# Patient Record
Sex: Male | Born: 1935 | Race: White | Hispanic: No | Marital: Married | Smoking: Never smoker
Health system: Southern US, Community
[De-identification: ages and names within clinical notes are randomized; demographics above are authoritative.]

## PROBLEM LIST (undated history)

## (undated) DIAGNOSIS — I509 Heart failure, unspecified: Secondary | ICD-10-CM

## (undated) DIAGNOSIS — E1140 Type 2 diabetes mellitus with diabetic neuropathy, unspecified: Secondary | ICD-10-CM

## (undated) DIAGNOSIS — E1142 Type 2 diabetes mellitus with diabetic polyneuropathy: Secondary | ICD-10-CM

## (undated) DIAGNOSIS — N289 Disorder of kidney and ureter, unspecified: Secondary | ICD-10-CM

## (undated) DIAGNOSIS — A0471 Enterocolitis due to Clostridium difficile, recurrent: Secondary | ICD-10-CM

## (undated) DIAGNOSIS — J189 Pneumonia, unspecified organism: Secondary | ICD-10-CM

## (undated) DIAGNOSIS — I8390 Asymptomatic varicose veins of unspecified lower extremity: Secondary | ICD-10-CM

## (undated) DIAGNOSIS — E785 Hyperlipidemia, unspecified: Secondary | ICD-10-CM

## (undated) DIAGNOSIS — C44319 Basal cell carcinoma of skin of other parts of face: Secondary | ICD-10-CM

## (undated) DIAGNOSIS — J9690 Respiratory failure, unspecified, unspecified whether with hypoxia or hypercapnia: Secondary | ICD-10-CM

## (undated) DIAGNOSIS — M7989 Other specified soft tissue disorders: Secondary | ICD-10-CM

## (undated) DIAGNOSIS — Z8739 Personal history of other diseases of the musculoskeletal system and connective tissue: Secondary | ICD-10-CM

## (undated) DIAGNOSIS — M1990 Unspecified osteoarthritis, unspecified site: Secondary | ICD-10-CM

## (undated) DIAGNOSIS — K219 Gastro-esophageal reflux disease without esophagitis: Secondary | ICD-10-CM

## (undated) DIAGNOSIS — I252 Old myocardial infarction: Secondary | ICD-10-CM

## (undated) DIAGNOSIS — E119 Type 2 diabetes mellitus without complications: Secondary | ICD-10-CM

## (undated) DIAGNOSIS — I10 Essential (primary) hypertension: Secondary | ICD-10-CM

## (undated) DIAGNOSIS — C44219 Basal cell carcinoma of skin of left ear and external auricular canal: Secondary | ICD-10-CM

## (undated) DIAGNOSIS — M79662 Pain in left lower leg: Secondary | ICD-10-CM

## (undated) DIAGNOSIS — E875 Hyperkalemia: Secondary | ICD-10-CM

## (undated) HISTORY — PX: KNEE ARTHROSCOPY: SHX127

## (undated) HISTORY — DX: Type 2 diabetes mellitus with diabetic neuropathy, unspecified: E11.40

## (undated) HISTORY — PX: CYSTOSCOPY W/ STONE MANIPULATION: SHX1427

## (undated) HISTORY — PX: FRACTURE SURGERY: SHX138

## (undated) HISTORY — DX: Enterocolitis due to Clostridium difficile, recurrent: A04.71

## (undated) HISTORY — PX: BASAL CELL CARCINOMA EXCISION: SHX1214

## (undated) HISTORY — DX: Hyperlipidemia, unspecified: E78.5

---

## 2011-08-19 ENCOUNTER — Ambulatory Visit (INDEPENDENT_AMBULATORY_CARE_PROVIDER_SITE_OTHER): Payer: Medicare Other | Admitting: Urology

## 2011-08-19 DIAGNOSIS — N400 Enlarged prostate without lower urinary tract symptoms: Secondary | ICD-10-CM

## 2011-08-19 DIAGNOSIS — R972 Elevated prostate specific antigen [PSA]: Secondary | ICD-10-CM

## 2011-08-19 DIAGNOSIS — K4090 Unilateral inguinal hernia, without obstruction or gangrene, not specified as recurrent: Secondary | ICD-10-CM

## 2011-10-05 DIAGNOSIS — L820 Inflamed seborrheic keratosis: Secondary | ICD-10-CM | POA: Diagnosis not present

## 2011-10-05 DIAGNOSIS — C44611 Basal cell carcinoma of skin of unspecified upper limb, including shoulder: Secondary | ICD-10-CM | POA: Diagnosis not present

## 2011-10-05 DIAGNOSIS — C44221 Squamous cell carcinoma of skin of unspecified ear and external auricular canal: Secondary | ICD-10-CM | POA: Diagnosis not present

## 2011-10-05 DIAGNOSIS — D0420 Carcinoma in situ of skin of unspecified ear and external auricular canal: Secondary | ICD-10-CM | POA: Diagnosis not present

## 2011-10-05 DIAGNOSIS — D485 Neoplasm of uncertain behavior of skin: Secondary | ICD-10-CM | POA: Diagnosis not present

## 2011-11-17 DIAGNOSIS — D0420 Carcinoma in situ of skin of unspecified ear and external auricular canal: Secondary | ICD-10-CM | POA: Diagnosis not present

## 2011-11-17 DIAGNOSIS — C44221 Squamous cell carcinoma of skin of unspecified ear and external auricular canal: Secondary | ICD-10-CM | POA: Diagnosis not present

## 2011-11-17 DIAGNOSIS — C44611 Basal cell carcinoma of skin of unspecified upper limb, including shoulder: Secondary | ICD-10-CM | POA: Diagnosis not present

## 2011-12-02 DIAGNOSIS — E119 Type 2 diabetes mellitus without complications: Secondary | ICD-10-CM | POA: Diagnosis not present

## 2011-12-02 DIAGNOSIS — N189 Chronic kidney disease, unspecified: Secondary | ICD-10-CM | POA: Diagnosis not present

## 2011-12-02 DIAGNOSIS — Z79899 Other long term (current) drug therapy: Secondary | ICD-10-CM | POA: Diagnosis not present

## 2011-12-02 DIAGNOSIS — E875 Hyperkalemia: Secondary | ICD-10-CM | POA: Diagnosis not present

## 2011-12-02 DIAGNOSIS — I129 Hypertensive chronic kidney disease with stage 1 through stage 4 chronic kidney disease, or unspecified chronic kidney disease: Secondary | ICD-10-CM | POA: Diagnosis not present

## 2011-12-02 DIAGNOSIS — D649 Anemia, unspecified: Secondary | ICD-10-CM | POA: Diagnosis not present

## 2011-12-02 DIAGNOSIS — R809 Proteinuria, unspecified: Secondary | ICD-10-CM | POA: Diagnosis not present

## 2011-12-05 DIAGNOSIS — E875 Hyperkalemia: Secondary | ICD-10-CM | POA: Diagnosis not present

## 2011-12-07 DIAGNOSIS — J309 Allergic rhinitis, unspecified: Secondary | ICD-10-CM | POA: Diagnosis not present

## 2011-12-19 DIAGNOSIS — I10 Essential (primary) hypertension: Secondary | ICD-10-CM | POA: Diagnosis not present

## 2011-12-19 DIAGNOSIS — E1129 Type 2 diabetes mellitus with other diabetic kidney complication: Secondary | ICD-10-CM | POA: Diagnosis not present

## 2011-12-19 DIAGNOSIS — E119 Type 2 diabetes mellitus without complications: Secondary | ICD-10-CM | POA: Diagnosis not present

## 2011-12-21 DIAGNOSIS — R972 Elevated prostate specific antigen [PSA]: Secondary | ICD-10-CM | POA: Diagnosis not present

## 2011-12-23 ENCOUNTER — Ambulatory Visit (INDEPENDENT_AMBULATORY_CARE_PROVIDER_SITE_OTHER): Payer: Medicare Other | Admitting: Urology

## 2011-12-23 DIAGNOSIS — R972 Elevated prostate specific antigen [PSA]: Secondary | ICD-10-CM

## 2011-12-23 DIAGNOSIS — N400 Enlarged prostate without lower urinary tract symptoms: Secondary | ICD-10-CM

## 2012-02-03 DIAGNOSIS — I129 Hypertensive chronic kidney disease with stage 1 through stage 4 chronic kidney disease, or unspecified chronic kidney disease: Secondary | ICD-10-CM | POA: Diagnosis not present

## 2012-02-03 DIAGNOSIS — D649 Anemia, unspecified: Secondary | ICD-10-CM | POA: Diagnosis not present

## 2012-02-03 DIAGNOSIS — Z79899 Other long term (current) drug therapy: Secondary | ICD-10-CM | POA: Diagnosis not present

## 2012-02-03 DIAGNOSIS — N189 Chronic kidney disease, unspecified: Secondary | ICD-10-CM | POA: Diagnosis not present

## 2012-02-03 DIAGNOSIS — R809 Proteinuria, unspecified: Secondary | ICD-10-CM | POA: Diagnosis not present

## 2012-02-07 DIAGNOSIS — N183 Chronic kidney disease, stage 3 unspecified: Secondary | ICD-10-CM | POA: Diagnosis not present

## 2012-02-07 DIAGNOSIS — I10 Essential (primary) hypertension: Secondary | ICD-10-CM | POA: Diagnosis not present

## 2012-02-07 DIAGNOSIS — E559 Vitamin D deficiency, unspecified: Secondary | ICD-10-CM | POA: Diagnosis not present

## 2012-02-07 DIAGNOSIS — E875 Hyperkalemia: Secondary | ICD-10-CM | POA: Diagnosis not present

## 2012-03-20 ENCOUNTER — Emergency Department (HOSPITAL_COMMUNITY): Payer: Medicare Other

## 2012-03-20 ENCOUNTER — Encounter (HOSPITAL_COMMUNITY): Payer: Self-pay | Admitting: Anesthesiology

## 2012-03-20 ENCOUNTER — Encounter (HOSPITAL_COMMUNITY): Payer: Self-pay | Admitting: Emergency Medicine

## 2012-03-20 ENCOUNTER — Encounter (HOSPITAL_COMMUNITY)
Admission: RE | Disposition: A | Discharge status: Skilled Nursing Facility | Payer: Self-pay | Source: Ambulatory Visit | Attending: Internal Medicine

## 2012-03-20 ENCOUNTER — Inpatient Hospital Stay (HOSPITAL_COMMUNITY)
Admission: RE | Admit: 2012-03-20 | Discharge: 2012-03-23 | DRG: 481 | Disposition: A | Discharge status: Skilled Nursing Facility | Payer: Medicare Other | Source: Ambulatory Visit | Attending: Internal Medicine | Admitting: Internal Medicine

## 2012-03-20 ENCOUNTER — Inpatient Hospital Stay (HOSPITAL_COMMUNITY): Payer: Medicare Other

## 2012-03-20 ENCOUNTER — Inpatient Hospital Stay (HOSPITAL_COMMUNITY): Payer: Medicare Other | Admitting: Anesthesiology

## 2012-03-20 DIAGNOSIS — E875 Hyperkalemia: Secondary | ICD-10-CM

## 2012-03-20 DIAGNOSIS — E119 Type 2 diabetes mellitus without complications: Secondary | ICD-10-CM | POA: Diagnosis not present

## 2012-03-20 DIAGNOSIS — D696 Thrombocytopenia, unspecified: Secondary | ICD-10-CM

## 2012-03-20 DIAGNOSIS — K219 Gastro-esophageal reflux disease without esophagitis: Secondary | ICD-10-CM | POA: Diagnosis not present

## 2012-03-20 DIAGNOSIS — S79919A Unspecified injury of unspecified hip, initial encounter: Secondary | ICD-10-CM | POA: Diagnosis not present

## 2012-03-20 DIAGNOSIS — Y92009 Unspecified place in unspecified non-institutional (private) residence as the place of occurrence of the external cause: Secondary | ICD-10-CM

## 2012-03-20 DIAGNOSIS — Z79899 Other long term (current) drug therapy: Secondary | ICD-10-CM

## 2012-03-20 DIAGNOSIS — T148XXA Other injury of unspecified body region, initial encounter: Secondary | ICD-10-CM | POA: Diagnosis not present

## 2012-03-20 DIAGNOSIS — N179 Acute kidney failure, unspecified: Secondary | ICD-10-CM | POA: Diagnosis not present

## 2012-03-20 DIAGNOSIS — Z794 Long term (current) use of insulin: Secondary | ICD-10-CM

## 2012-03-20 DIAGNOSIS — S72143A Displaced intertrochanteric fracture of unspecified femur, initial encounter for closed fracture: Secondary | ICD-10-CM | POA: Diagnosis not present

## 2012-03-20 DIAGNOSIS — S72009A Fracture of unspecified part of neck of unspecified femur, initial encounter for closed fracture: Secondary | ICD-10-CM | POA: Diagnosis not present

## 2012-03-20 DIAGNOSIS — Z5189 Encounter for other specified aftercare: Secondary | ICD-10-CM | POA: Diagnosis not present

## 2012-03-20 DIAGNOSIS — Z9181 History of falling: Secondary | ICD-10-CM | POA: Diagnosis not present

## 2012-03-20 DIAGNOSIS — N289 Disorder of kidney and ureter, unspecified: Secondary | ICD-10-CM

## 2012-03-20 DIAGNOSIS — K5900 Constipation, unspecified: Secondary | ICD-10-CM | POA: Diagnosis present

## 2012-03-20 DIAGNOSIS — I10 Essential (primary) hypertension: Secondary | ICD-10-CM

## 2012-03-20 DIAGNOSIS — S72141A Displaced intertrochanteric fracture of right femur, initial encounter for closed fracture: Secondary | ICD-10-CM

## 2012-03-20 DIAGNOSIS — IMO0002 Reserved for concepts with insufficient information to code with codable children: Secondary | ICD-10-CM | POA: Diagnosis not present

## 2012-03-20 DIAGNOSIS — W108XXA Fall (on) (from) other stairs and steps, initial encounter: Secondary | ICD-10-CM | POA: Diagnosis present

## 2012-03-20 DIAGNOSIS — S72009D Fracture of unspecified part of neck of unspecified femur, subsequent encounter for closed fracture with routine healing: Secondary | ICD-10-CM | POA: Diagnosis not present

## 2012-03-20 DIAGNOSIS — Z043 Encounter for examination and observation following other accident: Secondary | ICD-10-CM | POA: Diagnosis not present

## 2012-03-20 DIAGNOSIS — Z7982 Long term (current) use of aspirin: Secondary | ICD-10-CM | POA: Diagnosis not present

## 2012-03-20 HISTORY — DX: Asymptomatic varicose veins of unspecified lower extremity: I83.90

## 2012-03-20 HISTORY — DX: Hyperkalemia: E87.5

## 2012-03-20 HISTORY — PX: ORIF HIP FRACTURE: SHX2125

## 2012-03-20 HISTORY — DX: Essential (primary) hypertension: I10

## 2012-03-20 LAB — GLUCOSE, CAPILLARY
Glucose-Capillary: 156 mg/dL — ABNORMAL HIGH (ref 70–99)
Glucose-Capillary: 195 mg/dL — ABNORMAL HIGH (ref 70–99)
Glucose-Capillary: 217 mg/dL — ABNORMAL HIGH (ref 70–99)

## 2012-03-20 LAB — CBC WITH DIFFERENTIAL/PLATELET
Basophils Absolute: 0 10*3/uL (ref 0.0–0.1)
Basophils Relative: 1 % (ref 0–1)
Eosinophils Relative: 3 % (ref 0–5)
HCT: 41.8 % (ref 39.0–52.0)
MCHC: 34.2 g/dL (ref 30.0–36.0)
Monocytes Absolute: 0.6 10*3/uL (ref 0.1–1.0)
Neutro Abs: 6.6 10*3/uL (ref 1.7–7.7)
RDW: 13.6 % (ref 11.5–15.5)

## 2012-03-20 LAB — BASIC METABOLIC PANEL
BUN: 34 mg/dL — ABNORMAL HIGH (ref 6–23)
Calcium: 10.6 mg/dL — ABNORMAL HIGH (ref 8.4–10.5)
Chloride: 106 mEq/L (ref 96–112)
Creatinine, Ser: 1.63 mg/dL — ABNORMAL HIGH (ref 0.50–1.35)
GFR calc Af Amer: 45 mL/min — ABNORMAL LOW (ref >90)

## 2012-03-20 LAB — PREPARE RBC (CROSSMATCH)

## 2012-03-20 LAB — TYPE AND SCREEN: ABO/RH(D): A NEG

## 2012-03-20 SURGERY — OPEN REDUCTION INTERNAL FIXATION HIP
Anesthesia: Spinal | Site: Hip | Laterality: Right | Wound class: Clean

## 2012-03-20 MED ORDER — SENNA 8.6 MG PO TABS
1.0000 | ORAL_TABLET | Freq: Two times a day (BID) | ORAL | Status: DC
  Administered 2012-03-20 – 2012-03-23 (×6): 8.6 mg via ORAL
  Filled 2012-03-20 (×6): qty 1

## 2012-03-20 MED ORDER — BUPIVACAINE IN DEXTROSE 0.75-8.25 % IT SOLN
INTRATHECAL | Status: DC | PRN
  Administered 2012-03-20: 15 mg via INTRATHECAL

## 2012-03-20 MED ORDER — PROPOFOL 10 MG/ML IV EMUL
INTRAVENOUS | Status: AC
  Filled 2012-03-20: qty 20

## 2012-03-20 MED ORDER — PROPOFOL 10 MG/ML IV EMUL
INTRAVENOUS | Status: DC | PRN
  Administered 2012-03-20: 50 ug/kg/min via INTRAVENOUS

## 2012-03-20 MED ORDER — MIDAZOLAM HCL 2 MG/2ML IJ SOLN
INTRAMUSCULAR | Status: AC
  Filled 2012-03-20: qty 2

## 2012-03-20 MED ORDER — ACETAMINOPHEN 10 MG/ML IV SOLN
1000.0000 mg | Freq: Four times a day (QID) | INTRAVENOUS | Status: AC
  Administered 2012-03-20 – 2012-03-21 (×4): 1000 mg via INTRAVENOUS
  Filled 2012-03-20 (×5): qty 100

## 2012-03-20 MED ORDER — INSULIN ASPART 100 UNIT/ML ~~LOC~~ SOLN
0.0000 [IU] | Freq: Every day | SUBCUTANEOUS | Status: DC
  Administered 2012-03-20: 2 [IU] via SUBCUTANEOUS

## 2012-03-20 MED ORDER — ZOLPIDEM TARTRATE 5 MG PO TABS
5.0000 mg | ORAL_TABLET | Freq: Every day | ORAL | Status: DC
  Administered 2012-03-20 – 2012-03-21 (×2): 5 mg via ORAL
  Filled 2012-03-20 (×2): qty 1

## 2012-03-20 MED ORDER — VITAMIN D (ERGOCALCIFEROL) 1.25 MG (50000 UNIT) PO CAPS: 50000.0000 [IU] | ORAL_CAPSULE | ORAL | Status: DC

## 2012-03-20 MED ORDER — MUPIROCIN 2 % EX OINT
TOPICAL_OINTMENT | CUTANEOUS | Status: AC
  Filled 2012-03-20: qty 22

## 2012-03-20 MED ORDER — ONDANSETRON HCL 4 MG/2ML IJ SOLN: 4.0000 mg | Freq: Four times a day (QID) | INTRAMUSCULAR | Status: DC | PRN

## 2012-03-20 MED ORDER — FENTANYL CITRATE 0.05 MG/ML IJ SOLN
INTRAMUSCULAR | Status: DC | PRN
  Administered 2012-03-20: 25 ug via INTRAVENOUS
  Administered 2012-03-20: 50 ug via INTRAVENOUS

## 2012-03-20 MED ORDER — ENOXAPARIN SODIUM 30 MG/0.3ML ~~LOC~~ SOLN: 30.0000 mg | SUBCUTANEOUS | Status: DC

## 2012-03-20 MED ORDER — FENTANYL CITRATE 0.05 MG/ML IJ SOLN
INTRAMUSCULAR | Status: DC | PRN
  Administered 2012-03-20: 25 ug via INTRAVENOUS

## 2012-03-20 MED ORDER — CEFAZOLIN SODIUM 1-5 GM-% IV SOLN
1.0000 g | Freq: Once | INTRAVENOUS | Status: AC
  Administered 2012-03-20: 1 g via INTRAVENOUS

## 2012-03-20 MED ORDER — FENTANYL CITRATE 0.05 MG/ML IJ SOLN
INTRAMUSCULAR | Status: AC
  Filled 2012-03-20: qty 2

## 2012-03-20 MED ORDER — ONDANSETRON HCL 4 MG/2ML IJ SOLN
4.0000 mg | Freq: Four times a day (QID) | INTRAMUSCULAR | Status: DC | PRN
  Administered 2012-03-20 – 2012-03-21 (×2): 4 mg via INTRAVENOUS
  Filled 2012-03-20 (×2): qty 2

## 2012-03-20 MED ORDER — ONDANSETRON HCL 4 MG/2ML IJ SOLN
4.0000 mg | Freq: Once | INTRAMUSCULAR | Status: AC
  Administered 2012-03-20: 4 mg via INTRAVENOUS
  Filled 2012-03-20: qty 2

## 2012-03-20 MED ORDER — CEFAZOLIN SODIUM 1-5 GM-% IV SOLN
INTRAVENOUS | Status: DC | PRN
  Administered 2012-03-20: 1 g via INTRAVENOUS

## 2012-03-20 MED ORDER — ONDANSETRON HCL 4 MG/2ML IJ SOLN: 4.0000 mg | Freq: Once | INTRAMUSCULAR | Status: DC | PRN

## 2012-03-20 MED ORDER — LABETALOL HCL 200 MG PO TABS
300.0000 mg | ORAL_TABLET | Freq: Two times a day (BID) | ORAL | Status: DC
  Administered 2012-03-20 – 2012-03-23 (×6): 300 mg via ORAL
  Filled 2012-03-20 (×6): qty 2

## 2012-03-20 MED ORDER — DIPHENHYDRAMINE HCL 50 MG/ML IJ SOLN: 12.5000 mg | Freq: Four times a day (QID) | INTRAMUSCULAR | Status: DC | PRN

## 2012-03-20 MED ORDER — MIDAZOLAM HCL 5 MG/5ML IJ SOLN
INTRAMUSCULAR | Status: DC | PRN
  Administered 2012-03-20 (×2): 1 mg via INTRAVENOUS

## 2012-03-20 MED ORDER — SODIUM CHLORIDE 0.9 % IR SOLN
Status: DC | PRN
  Administered 2012-03-20: 1000 mL

## 2012-03-20 MED ORDER — FENTANYL CITRATE 0.05 MG/ML IJ SOLN: 25.0000 ug | INTRAMUSCULAR | Status: DC | PRN

## 2012-03-20 MED ORDER — ACETAMINOPHEN 325 MG PO TABS
650.0000 mg | ORAL_TABLET | Freq: Four times a day (QID) | ORAL | Status: DC | PRN
  Administered 2012-03-21 – 2012-03-23 (×4): 650 mg via ORAL
  Filled 2012-03-20 (×5): qty 2

## 2012-03-20 MED ORDER — CEFAZOLIN SODIUM 1-5 GM-% IV SOLN
INTRAVENOUS | Status: AC
  Filled 2012-03-20: qty 50

## 2012-03-20 MED ORDER — HYDROGEN PEROXIDE 3 % EX SOLN
CUTANEOUS | Status: DC | PRN
  Administered 2012-03-20: 1

## 2012-03-20 MED ORDER — MORPHINE SULFATE (PF) 1 MG/ML IV SOLN
INTRAVENOUS | Status: DC
  Administered 2012-03-20 – 2012-03-21 (×3): via INTRAVENOUS
  Filled 2012-03-20 (×3): qty 25

## 2012-03-20 MED ORDER — NIFEDIPINE ER OSMOTIC RELEASE 30 MG PO TB24
60.0000 mg | ORAL_TABLET | Freq: Two times a day (BID) | ORAL | Status: DC
  Administered 2012-03-20 – 2012-03-21 (×2): 60 mg via ORAL
  Filled 2012-03-20 (×2): qty 2

## 2012-03-20 MED ORDER — LABETALOL HCL 5 MG/ML IV SOLN
10.0000 mg | Freq: Once | INTRAVENOUS | Status: AC
  Administered 2012-03-20: 10 mg via INTRAVENOUS
  Filled 2012-03-20: qty 4

## 2012-03-20 MED ORDER — ALBUTEROL SULFATE (5 MG/ML) 0.5% IN NEBU: 2.5000 mg | INHALATION_SOLUTION | RESPIRATORY_TRACT | Status: DC | PRN

## 2012-03-20 MED ORDER — ONDANSETRON HCL 4 MG PO TABS: 4.0000 mg | ORAL_TABLET | Freq: Four times a day (QID) | ORAL | Status: DC | PRN

## 2012-03-20 MED ORDER — LABETALOL HCL 5 MG/ML IV SOLN
10.0000 mg | Freq: Four times a day (QID) | INTRAVENOUS | Status: DC | PRN
  Administered 2012-03-20: 10 mg via INTRAVENOUS
  Filled 2012-03-20: qty 4

## 2012-03-20 MED ORDER — MIDAZOLAM HCL 2 MG/2ML IJ SOLN
1.0000 mg | INTRAMUSCULAR | Status: DC | PRN
  Administered 2012-03-20: 2 mg via INTRAVENOUS

## 2012-03-20 MED ORDER — ACETAMINOPHEN 650 MG RE SUPP: 650.0000 mg | Freq: Four times a day (QID) | RECTAL | Status: DC | PRN

## 2012-03-20 MED ORDER — PROMETHAZINE HCL 25 MG/ML IJ SOLN: 12.5000 mg | INTRAMUSCULAR | Status: DC | PRN

## 2012-03-20 MED ORDER — ENOXAPARIN SODIUM 40 MG/0.4ML ~~LOC~~ SOLN
40.0000 mg | SUBCUTANEOUS | Status: DC
  Administered 2012-03-21 – 2012-03-23 (×3): 40 mg via SUBCUTANEOUS
  Filled 2012-03-20 (×3): qty 0.4

## 2012-03-20 MED ORDER — INSULIN ASPART 100 UNIT/ML ~~LOC~~ SOLN
0.0000 [IU] | Freq: Three times a day (TID) | SUBCUTANEOUS | Status: DC
  Administered 2012-03-20: 2 [IU] via SUBCUTANEOUS
  Administered 2012-03-21: 1 [IU] via SUBCUTANEOUS
  Administered 2012-03-21 – 2012-03-22 (×3): 2 [IU] via SUBCUTANEOUS
  Administered 2012-03-22: 1 [IU] via SUBCUTANEOUS

## 2012-03-20 MED ORDER — DIPHENHYDRAMINE HCL 12.5 MG/5ML PO ELIX: 12.5000 mg | ORAL_SOLUTION | Freq: Four times a day (QID) | ORAL | Status: DC | PRN

## 2012-03-20 MED ORDER — SODIUM CHLORIDE 0.9 % IJ SOLN: 9.0000 mL | INTRAMUSCULAR | Status: DC | PRN

## 2012-03-20 MED ORDER — LACTATED RINGERS IV SOLN: INTRAVENOUS | Status: DC

## 2012-03-20 MED ORDER — SODIUM CHLORIDE 0.9 % IV SOLN
INTRAVENOUS | Status: DC | PRN
  Administered 2012-03-20 (×2): via INTRAVENOUS

## 2012-03-20 MED ORDER — INSULIN DETEMIR 100 UNIT/ML ~~LOC~~ SOLN
54.0000 [IU] | Freq: Every day | SUBCUTANEOUS | Status: DC
  Administered 2012-03-20 – 2012-03-22 (×3): 54 [IU] via SUBCUTANEOUS
  Filled 2012-03-20: qty 10

## 2012-03-20 MED ORDER — NALOXONE HCL 0.4 MG/ML IJ SOLN: 0.4000 mg | INTRAMUSCULAR | Status: DC | PRN

## 2012-03-20 MED ORDER — BUPIVACAINE IN DEXTROSE 0.75-8.25 % IT SOLN
INTRATHECAL | Status: AC
  Filled 2012-03-20: qty 2

## 2012-03-20 MED ORDER — SODIUM CHLORIDE 0.45 % IV SOLN
INTRAVENOUS | Status: DC
  Administered 2012-03-20 – 2012-03-21 (×2): via INTRAVENOUS

## 2012-03-20 MED ORDER — BIOTENE DRY MOUTH MT LIQD
15.0000 mL | Freq: Two times a day (BID) | OROMUCOSAL | Status: DC
  Administered 2012-03-21 – 2012-03-23 (×5): 15 mL via OROMUCOSAL

## 2012-03-20 MED ORDER — FENTANYL CITRATE 0.05 MG/ML IJ SOLN
50.0000 ug | INTRAMUSCULAR | Status: AC | PRN
  Administered 2012-03-20 (×2): 50 ug via INTRAVENOUS
  Filled 2012-03-20 (×2): qty 2

## 2012-03-20 MED ORDER — VITAMIN D (ERGOCALCIFEROL) 1.25 MG (50000 UNIT) PO CAPS
50000.0000 [IU] | ORAL_CAPSULE | ORAL | Status: DC
  Administered 2012-03-21: 50000 [IU] via ORAL
  Filled 2012-03-20: qty 1

## 2012-03-20 MED ORDER — SODIUM CHLORIDE 0.9 % IV BOLUS (SEPSIS)
500.0000 mL | Freq: Once | INTRAVENOUS | Status: AC
  Administered 2012-03-20: 500 mL via INTRAVENOUS

## 2012-03-20 SURGICAL SUPPLY — 49 items
BAG HAMPER (MISCELLANEOUS) ×2 IMPLANT
BIT DRILL TWIST 3.5MM (BIT) ×1 IMPLANT
BLADE SURG SZ10 CARB STEEL (BLADE) ×4 IMPLANT
BLADE SURG SZ20 CARB STEEL (BLADE) ×2 IMPLANT
CLEANER TIP ELECTROSURG 2X2 (MISCELLANEOUS) ×2 IMPLANT
CLOTH BEACON ORANGE TIMEOUT ST (SAFETY) ×2 IMPLANT
COVER LIGHT HANDLE STERIS (MISCELLANEOUS) ×4 IMPLANT
COVER MAYO STAND XLG (DRAPE) ×2 IMPLANT
DRAPE STERI IOBAN 125X83 (DRAPES) ×2 IMPLANT
DRILL TWIST 3.5MM (BIT) ×2
ELECT REM PT RETURN 9FT ADLT (ELECTROSURGICAL) ×2
ELECTRODE REM PT RTRN 9FT ADLT (ELECTROSURGICAL) ×1 IMPLANT
EVACUATOR 3/16  PVC DRAIN (DRAIN) ×1
EVACUATOR 3/16 PVC DRAIN (DRAIN) ×1 IMPLANT
GAUZE XEROFORM 5X9 LF (GAUZE/BANDAGES/DRESSINGS) ×2 IMPLANT
GLOVE BIO SURGEON STRL SZ8 (GLOVE) ×2 IMPLANT
GLOVE BIO SURGEON STRL SZ8.5 (GLOVE) ×2 IMPLANT
GLOVE BIOGEL PI IND STRL 7.0 (GLOVE) ×3 IMPLANT
GLOVE BIOGEL PI INDICATOR 7.0 (GLOVE) ×3
GLOVE EXAM NITRILE MD LF STRL (GLOVE) ×2 IMPLANT
GLOVE SS BIOGEL STRL SZ 6.5 (GLOVE) ×3 IMPLANT
GLOVE SUPERSENSE BIOGEL SZ 6.5 (GLOVE) ×3
GOWN STRL REIN XL XLG (GOWN DISPOSABLE) ×6 IMPLANT
GUIDE PIN CALIBRATED (PIN) ×2 IMPLANT
INST SET MAJOR BONE (KITS) ×2 IMPLANT
KIT BLADEGUARD II DBL (SET/KITS/TRAYS/PACK) ×2 IMPLANT
KIT ROOM TURNOVER AP CYSTO (KITS) ×2 IMPLANT
MANIFOLD NEPTUNE II (INSTRUMENTS) ×2 IMPLANT
MARKER SKIN DUAL TIP RULER LAB (MISCELLANEOUS) ×2 IMPLANT
NS IRRIG 1000ML POUR BTL (IV SOLUTION) ×2 IMPLANT
PACK BASIC III (CUSTOM PROCEDURE TRAY) ×1
PACK SRG BSC III STRL LF ECLPS (CUSTOM PROCEDURE TRAY) ×1 IMPLANT
PAD ABD 5X9 TENDERSORB (GAUZE/BANDAGES/DRESSINGS) ×2 IMPLANT
PAD ARMBOARD 7.5X6 YLW CONV (MISCELLANEOUS) ×2 IMPLANT
PENCIL HANDSWITCHING (ELECTRODE) ×2 IMPLANT
PLATE SHORT BARRELL 140X4 (Plate) ×2 IMPLANT
SCREW CORTICAL SFTP 4.5X40MM (Screw) ×2 IMPLANT
SCREW CORTICAL SFTP 4.5X42MM (Screw) ×6 IMPLANT
SCREW LAG 120MM (Screw) ×2 IMPLANT
SET BASIN LINEN APH (SET/KITS/TRAYS/PACK) ×2 IMPLANT
SPONGE GAUZE 4X4 12PLY (GAUZE/BANDAGES/DRESSINGS) ×2 IMPLANT
SPONGE LAP 18X18 X RAY DECT (DISPOSABLE) ×4 IMPLANT
STAPLER VISISTAT 35W (STAPLE) ×2 IMPLANT
SUT BRALON NAB BRD #1 30IN (SUTURE) ×4 IMPLANT
SUT PLAIN 2 0 XLH (SUTURE) ×2 IMPLANT
SUT SILK 0 FSL (SUTURE) ×2 IMPLANT
SYR BULB IRRIGATION 50ML (SYRINGE) ×2 IMPLANT
TAPE MEDIFIX FOAM 3 (GAUZE/BANDAGES/DRESSINGS) ×2 IMPLANT
YANKAUER SUCT 12FT TUBE ARGYLE (SUCTIONS) ×2 IMPLANT

## 2012-03-20 NOTE — Anesthesia Preprocedure Evaluation (Addendum)
Anesthesia Evaluation  Patient identified by MRN, date of birth, ID band Patient awake    Reviewed: Allergy & Precautions, H&P , NPO status , Patient's Chart, lab work & pertinent test results  History of Anesthesia Complications Negative for: history of anesthetic complications  Airway Mallampati: II TM Distance: >3 FB     Dental  (+) Teeth Intact and Missing   Pulmonary neg pulmonary ROS,  breath sounds clear to auscultation        Cardiovascular hypertension, Pt. on medications Rhythm:Regular Rate:Normal     Neuro/Psych    GI/Hepatic GERD-  Controlled,  Endo/Other  Well Controlled, Type 2, Insulin Dependent  Renal/GU Renal InsufficiencyRenal disease     Musculoskeletal   Abdominal   Peds  Hematology   Anesthesia Other Findings   Reproductive/Obstetrics                           Anesthesia Physical Anesthesia Plan  ASA: III  Anesthesia Plan: Spinal   Post-op Pain Management:    Induction:   Airway Management Planned: Nasal Cannula  Additional Equipment:   Intra-op Plan:   Post-operative Plan:   Informed Consent: I have reviewed the patients History and Physical, chart, labs and discussed the procedure including the risks, benefits and alternatives for the proposed anesthesia with the patient or authorized representative who has indicated his/her understanding and acceptance.     Plan Discussed with:   Anesthesia Plan Comments:         Anesthesia Quick Evaluation

## 2012-03-20 NOTE — Anesthesia Procedure Notes (Signed)
Spinal  Patient location during procedure: OR Start time: 03/20/2012 1:04 PM Preanesthetic Checklist Completed: patient identified, site marked, surgical consent, pre-op evaluation, timeout performed, IV checked, risks and benefits discussed and monitors and equipment checked Spinal Block Patient position: right lateral decubitus Prep: Betadine Patient monitoring: heart rate and cardiac monitor Approach: right paramedian Location: L2-3 Injection technique: single-shot Needle Needle type: Spinocan  Needle gauge: 22 G Assessment Sensory level: T10 Additional Notes [id number] and clear      [id number]      01/2013

## 2012-03-20 NOTE — Progress Notes (Signed)
Transferred to bed without difficulty. Tolerated well.

## 2012-03-20 NOTE — H&P (Signed)
Zachary Mcmahon is an 78 y.o. male.   Chief Complaint: Pain right hip HPI: He fell going into his house.  He took a misstep and hurt his right hip in the fall.  He was unable to get up.  It happened around 6:30 this morning.  He had a biscuit and a cup of coffee around 4:30 or 5 this morning.  He has not had anything else to eat or drink.  He has no other injury.  Dr. Scotty Court is his family doctor in [place].  He has history of hypertension, diabetes and GERD.  Past Medical History  Diagnosis Date  . Hypertension   . Diabetes mellitus   . High potassium     Past Surgical History  Procedure Date  . Knee surgery     History reviewed. No pertinent family history. Social History:  does not have a smoking history on file. He does not have any smokeless tobacco history on file. He reports that he does not drink alcohol or use illicit drugs.  Allergies: No Known Allergies   (Not in a hospital admission)  Results for orders placed during the hospital encounter of 03/20/12 (from the past 48 hour(s))  BASIC METABOLIC PANEL     Status: Abnormal   Collection Time   03/20/12  8:20 AM      Component Value Range Comment   Sodium 144  135 - 145 mEq/L    Potassium 5.1  3.5 - 5.1 mEq/L    Chloride 106  96 - 112 mEq/L    CO2 28  19 - 32 mEq/L    Glucose, Bld 198 (*) 70 - 99 mg/dL    BUN 34 (*) 6 - 23 mg/dL    Creatinine, Ser 1.63 (*) 0.50 - 1.35 mg/dL    Calcium 10.6 (*) 8.4 - 10.5 mg/dL    GFR calc non Af Amer 39 (*) >90 mL/min    GFR calc Af Amer 45 (*) >90 mL/min   CBC WITH DIFFERENTIAL     Status: Abnormal   Collection Time   03/20/12  8:20 AM      Component Value Range Comment   WBC 8.6  4.0 - 10.5 K/uL    RBC 4.69  4.22 - 5.81 MIL/uL    Hemoglobin 14.3  13.0 - 17.0 g/dL    HCT 41.8  39.0 - 52.0 %    MCV 89.1  78.0 - 100.0 fL    MCH 30.5  26.0 - 34.0 pg    MCHC 34.2  30.0 - 36.0 g/dL    RDW 13.6  11.5 - 15.5 %    Platelets 147 (*) 150 - 400 K/uL    Neutrophils Relative 76  43 - 77 %    Neutro Abs 6.6  1.7 - 7.7 K/uL    Lymphocytes Relative 14  12 - 46 %    Lymphs Abs 1.2  0.7 - 4.0 K/uL    Monocytes Relative 7  3 - 12 %    Monocytes Absolute 0.6  0.1 - 1.0 K/uL    Eosinophils Relative 3  0 - 5 %    Eosinophils Absolute 0.3  0.0 - 0.7 K/uL    Basophils Relative 1  0 - 1 %    Basophils Absolute 0.0  0.0 - 0.1 K/uL   PROTIME-INR     Status: Normal   Collection Time   03/20/12  8:20 AM      Component Value Range Comment   Prothrombin Time 13.3  11.6 - 15.2 seconds    INR 0.99  0.00 - 1.49   TYPE AND SCREEN     Status: Normal   Collection Time   03/20/12  8:20 AM      Component Value Range Comment   ABO/RH(D) A NEG      Antibody Screen NEG      Sample Expiration 03/23/2012     ABO/RH     Status: Normal (Preliminary result)   Collection Time   03/20/12  8:20 AM      Component Value Range Comment   ABO/RH(D) A NEG      Dg Chest 1 View  03/20/2012  *RADIOLOGY REPORT*  Clinical Data: [place] this morning.  CHEST - 1 VIEW  Comparison: None.  Findings: Enlarged cardiac silhouette.  Clear lungs.  Small left diaphragmatic eventration.  Thoracic spine degenerative changes. No fracture or pneumothorax seen.  IMPRESSION: Cardiomegaly.  No acute abnormality.  Original Report Authenticated By: Gerald Stabs, M.D.   Dg Hip Complete Right  03/20/2012  *RADIOLOGY REPORT*  Clinical Data: Right hip pain following a fall this morning.  RIGHT HIP - COMPLETE 2+ VIEW  Comparison: None.  Findings: Poorly visualized right intertrochanteric fracture with mild posterior angulation of the distal fragment.  No significant displacement.  Lower lumbar spine degenerative changes.  IMPRESSION: Right intertrochanteric fracture.  Original Report Authenticated By: Gerald Stabs, M.D.    Review of Systems  Constitutional: Negative.   HENT: Negative.   Eyes: Negative.   Respiratory: Negative.   Cardiovascular: Negative.   Gastrointestinal: Positive for heartburn.  Genitourinary: Negative.     Musculoskeletal: Positive for falls (He fell today and hurt the right hip.  He has no other injury.).  Skin: Negative.   Neurological: Negative.   Endo/Heme/Allergies:       History of diabetes and insulin dependent for years.  It is well controlled.  Psychiatric/Behavioral: Negative.     Blood pressure 166/71, pulse 81, temperature 97.8 F (36.6 C), resp. rate 18, height 5' 9.5" (1.765 m), weight 99.791 kg (220 lb), SpO2 98.00%. Physical Exam  Constitutional: He is oriented to person, place, and time. He appears well-developed and well-nourished.  HENT:  Head: Normocephalic and atraumatic.  Eyes: Conjunctivae and EOM are normal.  Neck: Normal range of motion. Neck supple.  Cardiovascular: Normal rate, regular rhythm and intact distal pulses.   Respiratory: Effort normal and breath sounds normal.  GI: Soft. Bowel sounds are normal.  Musculoskeletal: He exhibits tenderness (Pain right hip with external rotation and shortening.).       Legs: Neurological: He is alert and oriented to person, place, and time. He has normal reflexes.  Skin: Skin is warm and dry.  Psychiatric: He has a normal mood and affect. His behavior is normal. Judgment and thought content normal.     Assessment/Plan Right hip intertrochanteric fracture  Diabetes mellitus, GERD and hypertension.  He will need surgery of the right hip.  I have explained the risks and imponderables of the procedure including infection, embolus that could cause death, need for physical therapy, possibility of rehab hospital or nursing home stay and anesthesia risks.  I would recommend a spinal.  He may need a blood transfusion.  The family and patient asked appropriate questions and agree to the procedure.  I told the patient, his family and the hospitalist that I will be going out of town tomorrow around 10:30 am.  Dr. Aline Brochure will not be back into town until August 1st.  There  will be about 15 to 20 hours of no orthopaedic coverage.   He will be admitted to the hospitalist and will have physician coverage.  They all understand and agree.  I will take him to surgery shortly.  Labs look good.   Jaiel Saraceno 03/20/2012, 11:17 AM

## 2012-03-20 NOTE — Brief Op Note (Signed)
03/20/2012  2:13 PM  PATIENT:  Zachary Mcmahon  78 y.o. male  PRE-OPERATIVE DIAGNOSIS:  Fracture Right Hip intertrochanteric  POST-OPERATIVE DIAGNOSIS:  Fracture Right Hip intertrochanteric  PROCEDURE:  Procedure(s) (LRB): OPEN REDUCTION INTERNAL FIXATION HIP (Right)  SURGEON:  Surgeon(s) and Role:    * Sanjuana Kava, MD - Primary  PHYSICIAN ASSISTANT:   ASSISTANTS: none   ANESTHESIA:   spinal  EBL:  Total I/O In: 1000 [I.V.:1000] Out: 250 [Urine:250]  BLOOD ADMINISTERED:none  DRAINS: (large) Hemovact drain(s) in the right hip with  Suction Open   LOCAL MEDICATIONS USED:  NONE  SPECIMEN:  No Specimen  DISPOSITION OF SPECIMEN:  N/A  COUNTS:  YES  TOURNIQUET:  * No tourniquets in log *  DICTATION: .Other Dictation: Dictation Number [id number]  PLAN OF CARE: Admit to inpatient   PATIENT DISPOSITION:  PACU - hemodynamically stable.   Delay start of Pharmacological VTE agent (>24hrs) due to surgical blood loss or risk of bleeding: no

## 2012-03-20 NOTE — Progress Notes (Signed)
Dr Patsey Berthold notified of [place] [facility]. No new orders given.

## 2012-03-20 NOTE — ED Notes (Signed)
Pt c/o right hip pain after missing a step and falling backwards this am. Denies neck/back pain. Denies loc.

## 2012-03-20 NOTE — ED Provider Notes (Signed)
History  This chart was scribed for Sharyon Cable, MD by Jenne Campus. This patient was seen in room [facility] and the patient's care was started at 8:01AM.  CSN: [id number]  Arrival date & time      First MD Initiated Contact with Patient 03/20/12 0801      Chief Complaint  Patient presents with  . Hip Pain    Patient is a 78 y.o. male presenting with hip pain. The history is provided by the patient. No language interpreter was used.  Hip Pain This is a new problem. The current episode started less than 1 hour ago. The problem occurs constantly. The problem has not changed since onset.Pertinent negatives include no chest pain, no abdominal pain and no shortness of breath.    Zachary Mcmahon is a 78 y.o. male brought in by ambulance, who presents to the Emergency Department complaining of one hour of sudden onset, non-changing, constant right hip pain after a fall. Pt states that he was going up 2 steps when he missed the top step and fell backwards landing on his right hip. He reports limited ROM of the hip due to pain. He denies head trauma, LOC, chest pain, abdominal pain, back pain and SOB as associated symptoms.    Past Medical History  Diagnosis Date  . Hypertension   . Diabetes mellitus   . High potassium     Past Surgical History  Procedure Date  . Knee surgery     History reviewed. No pertinent family history.  History  Substance Use Topics  . Smoking status: Not on file  . Smokeless tobacco: Not on file  . Alcohol Use: No      Review of Systems  Respiratory: Negative for cough and shortness of breath.   Cardiovascular: Negative for chest pain.  Gastrointestinal: Negative for nausea, vomiting, abdominal pain and diarrhea.  Musculoskeletal: Negative for back pain.       Right hip pain  All other systems reviewed and are negative.    Allergies  Review of patient's allergies indicates no known allergies.  Home Medications  No current outpatient  prescriptions on file.  Triage Vitals: BP 164/112  Pulse 76  Temp 97.8 F (36.6 C)  Resp 18  Ht 5' 9.5" (1.765 m)  Wt 220 lb (99.791 kg)  BMI 32.02 kg/m2  SpO2 99%  Physical Exam  Nursing note and vitals reviewed.  CONSTITUTIONAL: Well developed/well nourished HEAD AND FACE: Normocephalic/atraumatic EYES: EOMI/PERRL ENMT: Mucous membranes moist NECK: supple no meningeal signs SPINE:entire spine nontender, No bruising/crepitance/stepoffs noted to spine CV: S1/S2 noted, no murmurs/rubs/gallops noted LUNGS: Lungs are clear to auscultation bilaterally, no apparent distress ABDOMEN: soft, nontender, no rebound or guarding GU:no cva tenderness, chronic left inguinal hernia, chaperone present NEURO: Pt is awake/alert, moves all extremitiesx4 EXTREMITIES: pulses normal/equal distally, tenderness with ROM of right hip, right leg is shortened and externally rotated SKIN: warm, color normal PSYCH: no abnormalities of mood noted  ED Course  Procedures   DIAGNOSTIC STUDIES: Oxygen Saturation is 99% on room air, normal by my interpretation.    COORDINATION OF CARE: 8:07AM-Informed pt that he probably broken his hip and that it will need surgery. Discussed treatment plan of right hip x-ray and pain medication with pt at bedside and pt agreed to plan.  9:34 AM Pt stable at this time Informed of hip fracture Call to dr Luna Glasgow, he is currently in OR, will call back  10:02 AM D/w dr Caryn Section, medicine will accept for admission  and will also d/w dr Luna Glasgow about OR time She requests we start BP meds, pt takes labetalol will give one dose here    MDM  Nursing notes including past medical history and social history reviewed and considered in documentation xrays reviewed and considered labs/vitals reviewed and considered     Date: 03/20/2012  Rate: 75  Rhythm: normal sinus rhythm  QRS Axis: normal  Intervals: normal  ST/T Wave abnormalities: normal  Conduction  Disutrbances:none  Narrative Interpretation:   Old EKG Reviewed: none available at time of interpreation    I personally performed the services described in this documentation, which was scribed in my presence. The recorded information has been reviewed and considered.         Sharyon Cable, MD 03/20/12 1002

## 2012-03-20 NOTE — ED Notes (Signed)
repaged DR Luna Glasgow for Dr Christy Gentles

## 2012-03-20 NOTE — H&P (Signed)
[facility] History and Physical  Zachary Mcmahon [id number] DOB: 10-Mar-1932 DOA: 03/20/2012  Referring physician: Dr. Christy Gentles PCP: Deloria Lair, MD   Chief Complaint: Right hip pain  HPI:  The patient is a 78 year old man with a history significant for type 2 diabetes mellitus, hypertension, and hyperkalemia, who presents to the emergency department with a chief complaint of right hip pain. At approximately 6:30 AM this morning, as he was walking from his car port into his house, he mis-stepped on one of the steps, lost his balance, and fell backward on his right hip. He felt immediate sharp intense pain. He was unable to stand, but he was able to sit up. He bumped his head slightly, but there was no pain and no loss of consciousness. He had no preceding dizziness, headache, visual changes, chest pain, palpitations, or shortness of breath. He has had no recent low blood sugars. Currently, he has 5/10 right hip pain following Fentanyl.  In the emergency department, he is noted to be hemodynamically stable but hypertensive with a blood pressure 164/112. X-ray of his right hip reveals an acute right intertrochanteric fracture. His EKG reveals normal sinus rhythm with a heart rate of 75 beats per minute and no ST or T wave abnormalities. His lab data are significant for a BUN of 34, creatinine 1.63, calcium of 10.6, glucose of 198, and platelet count of 147. He is being admitted for further evaluation and management.  Review of Systems:  His review of systems is as above in history present illness. In addition, he has chronic varicose veins in his left leg and occasional swelling associated with it. He has acid reflux. He has chronic pain in his right knee. He denies heart disease or congestive heart failure symptoms. Otherwise, review of systems is negative.  Past Medical History  Diagnosis Date  . Hypertension   . Diabetes mellitus   . High potassium   . Varicose veins    Past  Surgical History  Procedure Date  . Knee surgery     Right knee, arthroscopic.   Social History: He is married. He lives at home with his wife independently. He has 3 children. He is a retired Printmaker. He denies tobacco, alcohol, and illicit drug use.    No Known Allergies  Family history: His mother is 37 years of age and has Alzheimer's disease. His father died of emphysema at 35 years of age.  Prior to Admission medications   Medication Sig Start Date End Date Taking? Authorizing Provider  acetaminophen (TYLENOL) 500 MG tablet Take 1,000 mg by mouth at bedtime. For pain   Yes Historical Provider, MD  aspirin EC 81 MG tablet Take 81 mg by mouth every morning.   Yes Historical Provider, MD  furosemide (LASIX) 20 MG tablet Take 20 mg by mouth every morning.   Yes Historical Provider, MD  insulin detemir (LEVEMIR) 100 UNIT/ML injection Inject 54 Units into the skin at bedtime.   Yes Historical Provider, MD  labetalol (NORMODYNE) 300 MG tablet Take 300 mg by mouth 2 (two) times daily.   Yes Historical Provider, MD  lisinopril (PRINIVIL,ZESTRIL) 40 MG tablet Take 20 mg by mouth every morning.   Yes Historical Provider, MD  NIFEdipine (PROCARDIA XL/ADALAT-CC) 60 MG 24 hr tablet Take 60 mg by mouth 2 (two) times daily.   Yes Historical Provider, MD  Vitamin D, Ergocalciferol, (DRISDOL) 50000 UNITS CAPS Take 50,000 Units by mouth every 7 (seven) days. On Tuesdays   Yes Historical Provider, MD  Physical Exam: Filed Vitals:   03/20/12 0801 03/20/12 0957 03/20/12 1053 03/20/12 1119  BP: 164/112 189/74 166/71 187/79  Pulse: 76 86 81 86  Temp: 97.8 F (36.6 C)     Resp: 18 18 18 18   Height:      Weight:      SpO2: 99% 96% 98% 96%     General:  Pleasant alert 78 year old Caucasian man lying in bed, in no acute distress.  Eyes: Head is normocephalic and nontraumatic. Pupils are equal, round, and reactive to light. Extraocular movements are intact. Conjunctivae are clear. Sclerae are  white.  ENT: Oropharynx reveals moist mucous membranes. No posterior exudates or erythema.  Neck: Supple, no adenopathy, no thyromegaly, no JVD.  Cardiovascular: S1, S2, with no murmurs rubs or gallops.  Respiratory: Clear to auscultation bilaterally.  Abdomen: Mildly obese, positive bowel sounds, soft, nontender, nondistended.  Skin: Good turgor. No rashes noted. Multiple large varicosities on the left leg.  Musculoskeletal: Right hip with moderate tenderness and edema. Range of motion of the left lower extremity not assessed. Pedal pulses palpable bilaterally. Varicose veins on the left leg prominent. Few small varicose veins on the right leg.  Psychiatric: Pleasant affect. Alert and oriented x3. Speech is clear.  Neurologic: Cranial nerves II through XII are intact. Strength is 5 over 5 throughout with exception of the right lower extremity which was not examined for strength. Sensation grossly intact.  Labs on Admission:  Basic Metabolic Panel:  Lab [id number] 0820  NA 144  K 5.1  CL 106  CO2 28  GLUCOSE 198*  BUN 34*  CREATININE 1.63*  CALCIUM 10.6*  MG --  PHOS --   Liver Function Tests: No results found for this basename: AST:5,ALT:5,ALKPHOS:5,BILITOT:5,PROT:5,ALBUMIN:5 in the last 168 hours No results found for this basename: LIPASE:5,AMYLASE:5 in the last 168 hours No results found for this basename: AMMONIA:5 in the last 168 hours CBC:  Lab 03/20/12 0820  WBC 8.6  NEUTROABS 6.6  HGB 14.3  HCT 41.8  MCV 89.1  PLT 147*   Cardiac Enzymes: No results found for this basename: CKTOTAL:5,CKMB:5,CKMBINDEX:5,TROPONINI:5 in the last 168 hours  BNP (last 3 results) No results found for this basename: PROBNP:3 in the last 8760 hours CBG: No results found for this basename: GLUCAP:5 in the last 168 hours  Radiological Exams on Admission: Dg Chest 1 View  03/20/2012  *RADIOLOGY REPORT*  Clinical Data: [place] this morning.  CHEST - 1 VIEW  Comparison: None.   Findings: Enlarged cardiac silhouette.  Clear lungs.  Small left diaphragmatic eventration.  Thoracic spine degenerative changes. No fracture or pneumothorax seen.  IMPRESSION: Cardiomegaly.  No acute abnormality.  Original Report Authenticated By: Gerald Stabs, M.D.   Dg Hip Complete Right  03/20/2012  *RADIOLOGY REPORT*  Clinical Data: Right hip pain following a fall this morning.  RIGHT HIP - COMPLETE 2+ VIEW  Comparison: None.  Findings: Poorly visualized right intertrochanteric fracture with mild posterior angulation of the distal fragment.  No significant displacement.  Lower lumbar spine degenerative changes.  IMPRESSION: Right intertrochanteric fracture.  Original Report Authenticated By: Gerald Stabs, M.D.    EKG: Normal sinus rhythm with a heart rate of 75 beats per minute and no acute abnormalities.  Assessment/Plan Principal Problem:  *Intertrochanteric fracture of right hip Active Problems:  HTN (hypertension), malignant  DM type 2 (diabetes mellitus, type 2)  Acute renal failure  Thrombocytopenia  History of hyperkalemia   1. Acute right intratrochanteric fracture, status post fall. Orthopedic  surgeon Dr. Luna Glasgow has seen the patient. He wants to operate today, within the next hour or 2. The patient is medically cleared for surgery. 2. Malignant hypertension. His elevated blood pressure may be accentuated by pain. He is treated chronically with labetalol, lisinopril, and Procardia. He has not taken any of his medications this morning. 3. Type 2 diabetes mellitus. He is treated chronically with Levemir. 4. Acute renal failure. This is presumed in the setting of ACE inhibitor therapy and diuretic therapy. His baseline creatinine is unknown. He may have underlying chronic kidney disease. 5. Thrombocytopenia. Etiology is unknown at this time. Will investigate further.      Plan: 1. The patient received 10 mg of labetalol IV. Will restart his chronic antihypertensive  medications following the operation. We'll add when necessary labetalol. 2. We'll hold Lasix and lisinopril. We'll start IV fluids with half-normal saline. 3. Continue Levemir for treatment of diabetes. We'll add sliding scale NovoLog. We'll check his hemoglobin A1c. 4. For evaluation of thrombocytopenia, will check a vitamin B12 level and TSH. 5. Postoperative recommendations per Dr. Luna Glasgow. (Of note, there would be no orthopedic coverage from Wednesday afternoon until Thursday afternoon. This was discussed with the patient's family and myself by Dr. Luna Glasgow. The patient wanted to stay here at [facility].)  Code Status: Full Family Communication: Completed with wife and daughter. Disposition Plan: Undetermined.  Time spent: One hour  [facility] Pager [phone]  If 7PM-7AM, please contact night-coverage [email or web address] Password [email or web address] 03/20/2012, 11:52 AM

## 2012-03-20 NOTE — Progress Notes (Signed)
Eyeglasses returned to pt.

## 2012-03-20 NOTE — ED Notes (Signed)
C/m/s intact ble. Cap refil <3 seconds, able to wiggle digits, dp pulses 1+.

## 2012-03-20 NOTE — Transfer of Care (Signed)
Immediate Anesthesia Transfer of Care Note  Patient: Zachary Mcmahon  Procedure(s) Performed: Procedure(s) (LRB): OPEN REDUCTION INTERNAL FIXATION HIP (Right)  Patient Location: PACU  Anesthesia Type: Spinal  Level of Consciousness: awake, alert , oriented and patient cooperative  Airway & Oxygen Therapy: Patient Spontanous Breathing and Patient connected to face mask oxygen  Post-op Assessment: Report given to PACU RN and Post -op Vital signs reviewed and stable  Post vital signs: Reviewed and stable  Complications: No apparent anesthesia complications

## 2012-03-20 NOTE — Anesthesia Postprocedure Evaluation (Signed)
  Anesthesia Post-op Note  Patient: Zachary Mcmahon  Procedure(s) Performed: Procedure(s) (LRB): OPEN REDUCTION INTERNAL FIXATION HIP (Right)  Patient Location: PACU  Anesthesia Type: Spinal  Level of Consciousness: awake, alert , oriented and patient cooperative  Airway and Oxygen Therapy: Patient Spontanous Breathing  Post-op Pain: 3 /10, mild  Post-op Assessment: Post-op Vital signs reviewed, Patient's Cardiovascular Status Stable, Respiratory Function Stable, Patent Airway, No signs of Nausea or vomiting and Pain level controlled  Post-op Vital Signs: Reviewed and stable  Complications: No apparent anesthesia complications

## 2012-03-21 DIAGNOSIS — E875 Hyperkalemia: Secondary | ICD-10-CM

## 2012-03-21 DIAGNOSIS — S72009A Fracture of unspecified part of neck of unspecified femur, initial encounter for closed fracture: Secondary | ICD-10-CM

## 2012-03-21 LAB — CBC
HCT: 33.7 % — ABNORMAL LOW (ref 39.0–52.0)
Hemoglobin: 11.3 g/dL — ABNORMAL LOW (ref 13.0–17.0)
MCH: 30.1 pg (ref 26.0–34.0)
MCV: 89.9 fL (ref 78.0–100.0)
RBC: 3.75 MIL/uL — ABNORMAL LOW (ref 4.22–5.81)

## 2012-03-21 LAB — COMPREHENSIVE METABOLIC PANEL
AST: 18 U/L (ref 0–37)
Albumin: 3.4 g/dL — ABNORMAL LOW (ref 3.5–5.2)
Calcium: 9.6 mg/dL (ref 8.4–10.5)
Chloride: 104 mEq/L (ref 96–112)
Creatinine, Ser: 2.11 mg/dL — ABNORMAL HIGH (ref 0.50–1.35)
Total Protein: 6 g/dL (ref 6.0–8.3)

## 2012-03-21 LAB — VITAMIN B12: Vitamin B-12: 291 pg/mL (ref 211–911)

## 2012-03-21 LAB — GLUCOSE, CAPILLARY
Glucose-Capillary: 175 mg/dL — ABNORMAL HIGH (ref 70–99)
Glucose-Capillary: 153 mg/dL — ABNORMAL HIGH (ref 70–99)

## 2012-03-21 LAB — TSH: TSH: 1.634 u[IU]/mL (ref 0.350–4.500)

## 2012-03-21 LAB — HEMOGLOBIN A1C: Hgb A1c MFr Bld: 7 % — ABNORMAL HIGH (ref <5.7)

## 2012-03-21 MED ORDER — OXYCODONE-ACETAMINOPHEN 5-325 MG PO TABS
1.0000 | ORAL_TABLET | ORAL | Status: DC | PRN
  Administered 2012-03-22: 1 via ORAL
  Filled 2012-03-21: qty 1

## 2012-03-21 MED ORDER — SODIUM POLYSTYRENE SULFONATE 15 GM/60ML PO SUSP
30.0000 g | Freq: Once | ORAL | Status: AC
  Administered 2012-03-21: 30 g via ORAL
  Filled 2012-03-21: qty 120

## 2012-03-21 MED ORDER — SODIUM CHLORIDE 0.9 % IV SOLN
INTRAVENOUS | Status: DC
  Administered 2012-03-21: 13:00:00 via INTRAVENOUS
  Administered 2012-03-22: 10 mL/h via INTRAVENOUS

## 2012-03-21 NOTE — Addendum Note (Signed)
Addendum  created 03/21/12 [id number] by Charmaine Downs, CRNA   Modules edited:Notes Section

## 2012-03-21 NOTE — Progress Notes (Signed)
Physical Therapy Treatment Patient Details Name: Zachary Mcmahon MRN: [id number] DOB: 1932/04/22 Today's Date: 03/21/2012 Time: 1031-1100 PT Time Calculation (min): 29 min  PT Assessment / Plan / Recommendation Comments on Treatment Session  Pt is extremely drowsy...he states that he has been using the PCA quite a bit.  His transfer chair to bed was less labored than this AM, but still difficult.  Unable to do any ther ex due to drowsiness.  Will try to progress to gait in AM.    Follow Up Recommendations  Skilled nursing facility    Barriers to Discharge Decreased caregiver support      Equipment Recommendations  Defer to next venue    Recommendations for Other Services    Frequency Min 5X/week   Plan Discharge plan remains appropriate;Frequency remains appropriate    Precautions / Restrictions Precautions Precautions: Fall Restrictions Weight Bearing Restrictions: Yes RLE Weight Bearing: Touchdown weight bearing   Pertinent Vitals/Pain     Mobility  Bed Mobility Bed Mobility: Supine to Sit;Sit to Supine Supine to Sit: 1: +1 Total assist;HOB elevated Sit to Supine: 2: Max assist Details for Bed Mobility Assistance: full assist needed to lift LEs into bed...pt instructed in using trapeze for bed positioning Transfers Transfers: Sit to Stand;Stand to Sit Sit to Stand: 2: Max assist;With armrests;From chair/3-in-1 Stand to Sit: 2: Max assist;With upper extremity assist;To bed Ambulation/Gait Ambulation/Gait Assistance: 3: Mod assist Ambulation Distance (Feet): 2 Feet Assistive device: Rolling walker Ambulation/Gait Assistance Details: transfer with walker less labored, but understands which leg to move appropriately General Gait Details: unable to evaluate gait pattern as he did not take very many steps Stairs: No Wheelchair Mobility Wheelchair Mobility: No    Exercises General Exercises - Lower Extremity Ankle Circles/Pumps: AROM;Both;10 reps;Supine Quad Sets:  AROM;Both;10 reps;Supine Gluteal Sets: AROM;Both;10 reps;Supine Short Arc Quad: AAROM;Right;10 reps;Supine Heel Slides: AAROM;Right;10 reps;Supine Hip ABduction/ADduction: AAROM;Right;10 reps;Supine   PT Diagnosis: Difficulty walking;Abnormality of gait;Generalized weakness;Acute pain  PT Problem List: Decreased strength;Decreased range of motion;Decreased activity tolerance;Decreased mobility;Decreased knowledge of use of DME;Decreased knowledge of precautions;Pain PT Treatment Interventions: DME instruction;Gait training;Functional mobility training;Therapeutic activities;Therapeutic exercise;Patient/family education   PT Goals Acute Rehab PT Goals PT Goal Formulation: With patient Time For Goal Achievement: 03/28/12 Potential to Achieve Goals: Good Pt will go Supine/Side to Sit: with max assist;with HOB not 0 degrees (comment degree) PT Goal: Supine/Side to Sit - Progress: Goal set today Pt will go Sit to Supine/Side: with min assist PT Goal: Sit to Supine/Side - Progress: Updated due to goal met Pt will go Sit to Stand: with mod assist;with upper extremity assist PT Goal: Sit to Stand - Progress: Progressing toward goal Pt will go Stand to Sit: with mod assist;with upper extremity assist PT Goal: Stand to Sit - Progress: Progressing toward goal Pt will Ambulate: 1 - 15 feet;with mod assist;with rolling walker PT Goal: Ambulate - Progress: Not progressing  Visit Information  Last PT Received On: 03/21/12    Subjective Data  Subjective: pt very drowsy...not speaking very much Patient Stated Goal: none stated   Cognition  Overall Cognitive Status: Appears within functional limits for tasks assessed/performed Arousal/Alertness: Awake/alert Orientation Level: Appears intact for tasks assessed Behavior During Session: [facility] for tasks performed Cognition - Other Comments: pt somewhat drowsy from pain med    Balance  Balance Balance Assessed: No  End of Session PT - End of  Session Equipment Utilized During Treatment: Gait belt Activity Tolerance: Patient limited by fatigue Patient left: in bed;with call bell/phone within  reach;with family/visitor present Nurse Communication: Mobility status   GP     [name] 03/21/2012, 11:07 AM

## 2012-03-21 NOTE — Clinical Social Work Psychosocial (Signed)
    Clinical Social Work Department BRIEF PSYCHOSOCIAL ASSESSMENT 03/21/2012  Patient:  Zachary Mcmahon,Zachary Mcmahon     Account Number:  [id number]     Admit date:  03/20/2012  Clinical Social Worker:  Edwyna Shell, [place]  Date/Time:  03/21/2012 11:30 AM  Referred by:  Physician  Date Referred:  03/21/2012 Referred for  SNF Placement   Other Referral:   Interview type:  Patient Other interview type:    PSYCHOSOCIAL DATA Living Status:  WIFE Admitted from facility:   Level of care:   Primary support name:  Gerre Pebbles Primary support relationship to patient:  CHILD, ADULT Degree of support available:   Patient is primary caregiver for wife who has Alzheimers dementia.  Daughter and son in law live nearby and help as needed.    CURRENT CONCERNS Current Concerns  Post-Acute Placement   Other Concerns:    SOCIAL WORK ASSESSMENT / PLAN CSW met w patient at bedside.  Patient oriented, but somewhat sleepy which is consistent w  post op day one. Patient is married and retired from [employer] in 2000.  Has daughter and son in law who live nearby in [place].  States he has neighbors who "would help out if I asked them."    Patient expressed strong desire to return home at discharge.  He is primary caregiver for wife who has Alzheimers dementia and cannot care for herself.  Patient states he "does not have a plan yet" about how to handle caring for wife and caring for himself as he recovers from surgery.  States he fell on steps while passing from garage to kitchen.    Patient cannot verbalize his projected physical limitations when discharged, but feels pressed to return home and resume care for his wife.  When asked what wife can do for herself, he stated "she cannot cook."  Patient is only driver in household and appears to do most of the household chores without outside help at this time.  Patient seems uncertain about who would care for his wife if he entered SNF or other rehab  facliity at discharge.  While patient may understand that he needs significant help w ADLs as he recovers, he has not reconciled his needs for help w his wife's need for care at the same time.    Patient agreed that CSW could call daughter to get more information on supports available at discharge. Left VM for daughter.  CSW will prepare patient information to fax out for SNF placement and will also try to contact daughter to explore resources for help that both patient and wife will need during patient's recovery time. Patient stated that if he were to go to to a SNF, he would prefer one in [place] near his home.   Assessment/plan status:  Psychosocial Support/Ongoing Assessment of Needs Other assessment/ plan:   Information/referral to community resources:   SNF list  SNF Placement Handbook    PATIENT'S/FAMILY'S RESPONSE TO PLAN OF CARE: Patient appreciative.    Justice [place] Clinical Social Worker [phone])

## 2012-03-21 NOTE — Progress Notes (Signed)
UR Chart Review Completed  

## 2012-03-21 NOTE — Evaluation (Signed)
Physical Therapy Evaluation Patient Details Name: Zachary Mcmahon MRN: [id number] DOB: May 11, 1932 Today's Date: 03/21/2012 Time: 0821-0910 PT Time Calculation (min): 49 min  PT Assessment / Plan / Recommendation Clinical Impression  Pt is very pleasant and cooperative, drowsy from pain med.  He lives with his wife who has Alzheimers and is the CG for her.  Currently, he needs max assist for all mobility and therefore I am assuming that he will need SNF at d/c.  I spoke with pt about this but I am not sure if he understood due to drowsiness.    PT Assessment  Patient needs continued PT services    Follow Up Recommendations  Skilled nursing facility    Barriers to Discharge Decreased caregiver support      Equipment Recommendations  Defer to next venue    Recommendations for Other Services     Frequency Min 5X/week    Precautions / Restrictions Precautions Precautions: Fall Restrictions Weight Bearing Restrictions: Yes RLE Weight Bearing: Touchdown weight bearing   Pertinent Vitals/Pain       Mobility  Bed Mobility Bed Mobility: Supine to Sit;Sit to Supine Supine to Sit: 1: +1 Total assist;HOB elevated Sit to Supine: Not Tested (comment) Transfers Transfers: Sit to Stand;Stand to Sit Sit to Stand: 2: Max assist;With upper extremity assist;From bed Stand to Sit: 2: Max assist;To chair/3-in-1;With upper extremity assist Ambulation/Gait Ambulation/Gait Assistance: 2: Max assist Ambulation Distance (Feet): 2 Feet Assistive device: Rolling walker Ambulation/Gait Assistance Details: extremely labored transfer using walker from bed to chair General Gait Details: unable to evaluate gait pattern as he did not take very many steps Stairs: No Wheelchair Mobility Wheelchair Mobility: No    Exercises General Exercises - Lower Extremity Ankle Circles/Pumps: AROM;Both;10 reps;Supine Quad Sets: AROM;Both;10 reps;Supine Gluteal Sets: AROM;Both;10 reps;Supine Short Arc Quad:  AAROM;Right;10 reps;Supine Heel Slides: AAROM;Right;10 reps;Supine Hip ABduction/ADduction: AAROM;Right;10 reps;Supine   PT Diagnosis: Difficulty walking;Abnormality of gait;Generalized weakness;Acute pain  PT Problem List: Decreased strength;Decreased range of motion;Decreased activity tolerance;Decreased mobility;Decreased knowledge of use of DME;Decreased knowledge of precautions;Pain PT Treatment Interventions: DME instruction;Gait training;Functional mobility training;Therapeutic activities;Therapeutic exercise;Patient/family education   PT Goals Acute Rehab PT Goals PT Goal Formulation: With patient Time For Goal Achievement: 03/28/12 Potential to Achieve Goals: Good Pt will go Supine/Side to Sit: with max assist;with HOB not 0 degrees (comment degree) PT Goal: Supine/Side to Sit - Progress: Goal set today Pt will go Sit to Supine/Side: with mod assist;with HOB not 0 degrees (comment degree) PT Goal: Sit to Supine/Side - Progress: Goal set today Pt will go Sit to Stand: with mod assist;with upper extremity assist PT Goal: Sit to Stand - Progress: Goal set today Pt will go Stand to Sit: with mod assist;with upper extremity assist PT Goal: Stand to Sit - Progress: Goal set today Pt will Ambulate: 1 - 15 feet;with mod assist;with rolling walker PT Goal: Ambulate - Progress: Goal set today  Visit Information  Last PT Received On: 03/21/12    Subjective Data  Subjective: My wife has Alzheimers Patient Stated Goal: none stated   Prior Functioning  Home Living Lives With: Spouse Available Help at Discharge: Family Type of Home: House Home Access: Stairs to enter [employer] of Steps: 1 Entrance Stairs-Rails: None Home Layout: Laundry or work area in basement;One level Bathroom Toilet: Standard Home Adaptive Equipment: Straight cane Additional Comments: pt ambulated with no assistive device Prior Function Level of Independence: Independent Able to Take Stairs?:  Yes Driving: Yes Vocation: Retired Corporate investment banker: No difficulties  Cognition  Overall Cognitive Status: Appears within functional limits for tasks assessed/performed Arousal/Alertness: Awake/alert Orientation Level: Appears intact for tasks assessed Behavior During Session: [facility] for tasks performed Cognition - Other Comments: pt somewhat drowsy from pain med    Extremity/Trunk Assessment Right Upper Extremity Assessment RUE ROM/Strength/Tone: Within functional levels RUE Sensation: WFL - Light Touch;WFL - Proprioception RUE Coordination: WFL - gross motor Left Upper Extremity Assessment LUE ROM/Strength/Tone: Within functional levels LUE Sensation: WFL - Light Touch;WFL - Proprioception LUE Coordination: WFL - gross motor Right Lower Extremity Assessment RLE ROM/Strength/Tone: Unable to fully assess;Due to pain RLE Sensation: WFL - Light Touch Left Lower Extremity Assessment LLE ROM/Strength/Tone: WFL for tasks assessed LLE Sensation: WFL - Light Touch;WFL - Proprioception LLE Coordination: WFL - gross motor Trunk Assessment Trunk Assessment: Normal   Balance Balance Balance Assessed: No  End of Session PT - End of Session Equipment Utilized During Treatment: Gait belt Activity Tolerance: Patient limited by fatigue;Patient tolerated treatment well Patient left: in chair;with call bell/phone within reach Nurse Communication: Mobility status  GP     [name] 03/21/2012, 9:21 AM

## 2012-03-21 NOTE — Progress Notes (Signed)
Chart reviewed. Discussed with Dr. Luna Glasgow.  Subjective: Nauseated. Per nursing, requiring much assistance. Using a lot of pain medication.  Objective: Vital signs in last 24 hours: Filed Vitals:   03/21/12 0500 03/21/12 0748 03/21/12 0800 03/21/12 1200  BP: 97/55     Pulse: 66     Temp: 98.1 F (36.7 C)     TempSrc: Oral     Resp: 20 16 16 16   Height:      Weight:      SpO2: 95% 95%  94%   Weight change:   Intake/Output Summary (Last 24 hours) at 03/21/12 1251 Last data filed at 03/21/12 0856  Gross per 24 hour  Intake 2243.67 ml  Output    500 ml  Net 1743.67 ml   Gen. alert, appropriate. Train use the urinal. Lungs clear to auscultation bilaterally without wheeze rhonchi or rales Cardiovascular regular rate rhythm without murmurs gallops rubs Abdomen soft nontender nondistended Extremities no clubbing cyanosis or edema Lab Results: Basic Metabolic Panel:  Lab [id number] 03/20/12 0820  NA 135 144  K 5.5* 5.1  CL 104 106  CO2 24 28  GLUCOSE 190* 198*  BUN 37* 34*  CREATININE 2.11* 1.63*  CALCIUM 9.6 10.6*  MG -- --  PHOS -- --   Liver Function Tests:  Lab 03/21/12 0506  AST 18  ALT 10  ALKPHOS 76  BILITOT 0.8  PROT 6.0  ALBUMIN 3.4*   No results found for this basename: LIPASE:2,AMYLASE:2 in the last 168 hours No results found for this basename: AMMONIA:2 in the last 168 hours CBC:  Lab 03/21/12 0506 03/20/12 0820  WBC 9.1 8.6  NEUTROABS -- 6.6  HGB 11.3* 14.3  HCT 33.7* 41.8  MCV 89.9 89.1  PLT 131* 147*   Cardiac Enzymes: No results found for this basename: CKTOTAL:3,CKMB:3,CKMBINDEX:3,TROPONINI:3 in the last 168 hours BNP: No results found for this basename: PROBNP:3 in the last 168 hours D-Dimer: No results found for this basename: DDIMER:2 in the last 168 hours CBG:  Lab 03/21/12 1114 03/21/12 0711 03/20/12 2034 03/20/12 1617 03/20/12 1436 03/20/12 1218  GLUCAP 153* 175* 217* 195* 156* 168*   Hemoglobin A1C:  Lab 03/20/12  0820  HGBA1C 7.0*   Fasting Lipid Panel: No results found for this basename: CHOL,HDL,LDLCALC,TRIG,CHOLHDL,LDLDIRECT in the last 168 hours Thyroid Function Tests:  Lab 03/20/12 0820  TSH 1.634  T4TOTAL --  FREET4 --  T3FREE --  THYROIDAB --   Coagulation:  Lab 03/20/12 0820  LABPROT 13.3  INR 0.99   Anemia Panel:  Lab 03/20/12 0820  VITAMINB12 291  FOLATE --  FERRITIN --  TIBC --  IRON --  RETICCTPCT --   Urine Drug Screen: Drugs of Abuse  No results found for this basename: labopia, cocainscrnur, labbenz, amphetmu, thcu, labbarb    Alcohol Level: No results found for this basename: ETH:2 in the last 168 hours Urinalysis: No results found for this basename: COLORURINE:2,APPERANCEUR:2,LABSPEC:2,PHURINE:2,GLUCOSEU:2,HGBUR:2,BILIRUBINUR:2,KETONESUR:2,PROTEINUR:2,UROBILINOGEN:2,NITRITE:2,LEUKOCYTESUR:2 in the last 168 hours  Micro Results: Recent Results (from the past 240 hour(s))  SURGICAL PCR SCREEN     Status: Abnormal   Collection Time   03/20/12 12:05 PM      Component Value Range Status Comment   MRSA, PCR NEGATIVE  NEGATIVE Final    Staphylococcus aureus POSITIVE (*) NEGATIVE Final    Studies/Results: Dg Chest 1 View  03/20/2012  *RADIOLOGY REPORT*  Clinical Data: [place] this morning.  CHEST - 1 VIEW  Comparison: None.  Findings: Enlarged cardiac silhouette.  Clear lungs.  Small left diaphragmatic eventration.  Thoracic spine degenerative changes. No fracture or pneumothorax seen.  IMPRESSION: Cardiomegaly.  No acute abnormality.  Original Report Authenticated By: Gerald Stabs, M.D.   Dg Hip Complete Right  03/20/2012  *RADIOLOGY REPORT*  Clinical Data: Right hip pain following a fall this morning.  RIGHT HIP - COMPLETE 2+ VIEW  Comparison: None.  Findings: Poorly visualized right intertrochanteric fracture with mild posterior angulation of the distal fragment.  No significant displacement.  Lower lumbar spine degenerative changes.  IMPRESSION: Right  intertrochanteric fracture.  Original Report Authenticated By: Gerald Stabs, M.D.   Dg Hip Operative Right  03/20/2012  *RADIOLOGY REPORT*  Clinical Data: ORIF of intertrochanteric fracture  DG OPERATIVE RIGHT HIP  Comparison: Preoperative radiographs obtained earlier today, 03/20/2012 at 08:42 a.m.  Findings: Multiple fluoroscopic spot images obtained during dynamic hip screw and lateral buttress plate and screw fixation of the intertrochanteric fracture.  No evidence of immediate hardware complication, or periprosthetic fracture.  IMPRESSION: Intraoperative spot images obtained during ORIF of a right hip intertrochanteric fracture as detailed above.  Please note images only were submitted for radiologic interpretation.  Please see operative note for fluoroscopy dose, and additional details.  Original Report Authenticated By: Myrle Sheng   Scheduled Meds:   . acetaminophen  1,000 mg Intravenous Q6H  . antiseptic oral rinse  15 mL Mouth Rinse BID  . enoxaparin (LOVENOX) injection  40 mg Subcutaneous Q24H  . insulin aspart  0-5 Units Subcutaneous QHS  . insulin aspart  0-9 Units Subcutaneous TID WC  . insulin detemir  54 Units Subcutaneous QHS  . labetalol  300 mg Oral BID  . morphine   Intravenous Q4H  . senna  1 tablet Oral BID  . sodium polystyrene  30 g Oral Once  . Vitamin D (Ergocalciferol)  50,000 Units Oral Q7 days  . zolpidem  5 mg Oral QHS  . DISCONTD: enoxaparin (LOVENOX) injection  30 mg Subcutaneous Q24H  . DISCONTD: NIFEdipine  60 mg Oral BID  . DISCONTD: Vitamin D (Ergocalciferol)  50,000 Units Oral Q7 days   Continuous Infusions:   . sodium chloride    . DISCONTD: sodium chloride 100 mL/hr at 03/21/12 0215  . DISCONTD: lactated ringers     PRN Meds:.acetaminophen, acetaminophen, albuterol, diphenhydrAMINE, diphenhydrAMINE, labetalol, naloxone, ondansetron (ZOFRAN) IV, ondansetron, promethazine, sodium chloride, DISCONTD: fentaNYL, DISCONTD: hydrogen peroxide, DISCONTD:  midazolam, DISCONTD: ondansetron (ZOFRAN) IV, DISCONTD: ondansetron (ZOFRAN) IV, DISCONTD: sodium chloride irrigation Assessment/Plan: Principal Problem:  *Intertrochanteric fracture of right hip Active Problems:  Hyperkalemia  HTN (hypertension), malignant  DM type 2 (diabetes mellitus, type 2)  Acute renal failure  Thrombocytopenia  Will give a dose of Kayexalate. ACE inhibitor has been stopped since admission. Blood pressure borderline low. Stop Procardia. Continue IV hydration. Skilled nursing facility versus inpatient rehabilitation. Check CBC and BMET in the morning. Continue telemetry for now because of hyperkalemia.   LOS: 1 day   Josten Warmuth L 03/21/2012, 12:51 PM

## 2012-03-21 NOTE — Op Note (Signed)
Zachary Mcmahon, Zachary Mcmahon                ACCOUNT NO.:  [id number]  MEDICAL RECORD NO.:  [id number]  LOCATION:  [facility]                          FACILITY:  [facility]  PHYSICIAN:  J. Sanjuana Kava, M.D. DATE OF BIRTH:  08/23/1931  DATE OF PROCEDURE:  03/20/2012 DATE OF DISCHARGE:                              OPERATIVE REPORT   PREOPERATIVE DIAGNOSIS:  Intertrochanteric fracture of the right hip.  POSTOPERATIVE DIAGNOSIS:  Intertrochanteric fracture of the right hip.  PROCEDURE:  Open treatment internal reduction of the right hip, intertrochanteric fracture using [employer] hip compression system with 140 degree, 4 hole side barrel, short barrel, and 120 mm compression screw.  ANESTHESIA:  Spinal.  SURGEON:  J. Sanjuana Kava, M.D.  ESTIMATED BLOOD LOSS:  150 to 250 mL.  DRAIN:  One large Hemovac drain.  No blood given.  INDICATIONS:  The patient fell early this morning and injured his right hip.  X-ray showed intertrochanteric fracture of the right hip.  There is no other injury.  He has diabetes, hypertension, and history of GERD. He was seen by Internal Medicine, admitted to their service.  I explained the risks and imponderables to the family and also I will be out of town after tomorrow, appeared to understand and agreed.  DESCRIPTION OF PROCEDURE:  The patient was seen in the holding area. The right hip was identified as the correct surgical site.  I placed a mark on the right hip.  He was brought to the operating room, given spinal anesthesia, and placed on the fracture table.  The C-arm fluoroscopy unit was brought in.  We had a generalized time-out per the radiology procedure, and everyone had on apron and thyroid shield and x- ray batches.  The position of the fracture was ascertained to be satisfactory and did a prep and drape.  We had a generalized time-out identified the patient again as Mr. Folkerts for donor right hip for intertrochanteric fracture hip.  All  instrumentation in the room was working properly.  The OR team knew each other.  Incision made through skin, subcutaneous tissue, tensor fascia lata, vastus lateralis.  Guide pin was placed in the femoral neck with good AP and lateral views.  Selected 140 degree side plate, short barrel 4 hole. Compression screw measured 120 mm.  Step drill was used and then the compression screw was inserted.  The side plate was attached, 4 holes were made and the hip was put in a compression.  Permanent x-rays were taken in AP and lateral views looked very good.  Hemovac drain was placed sewn with 2-0 silk.  The vastus lateralis reapproximated using running locking #1 Surgilon suture.  The fascia layer reapproximated using #1 Surgilon suture in a figure-of-eight fashion.  Subcutaneous tissue was closed with 2-0 plain and skin staples.  Sterile dressing applied.  The patient tolerated the procedure well, go to recovery in good condition.          ______________________________ Lenna Sciara. Sanjuana Kava, M.D.     JWK/MEDQ  D:  03/20/2012  T:  03/21/2012  Job:  [id number]

## 2012-03-21 NOTE — Care Management Note (Signed)
    Page 1 of 1   03/23/2012     2:04:26 PM   CARE MANAGEMENT NOTE 03/23/2012  Patient:  Zachary Mcmahon,Zachary Mcmahon   Account Number:  [id number]  Date Initiated:  03/21/2012  Documentation initiated by:  Theophilus Kinds  Subjective/Objective Assessment:   Pt admitted from home s/p fall which resulted in a fractured hip. Pt is s/p ORIF of right hip. Pt takes care of demented wife. Pts daughter is taking care of pts wife while he is in hospital.     Action/Plan:   Pt a possible CIR candidate. Pt wants CM to speak to his daughter about discharge plans. CSW is aware of pts possible need for SNF. Will continue to contact pts daughter for furthur information.   Anticipated DC Date:  03/23/2012   Anticipated DC Plan:  SKILLED NURSING FACILITY  In-house referral  Clinical Social Worker      DC Planning Services  CM consult      Choice offered to / List presented to:             Status of service:  Completed, signed off Medicare Important Message given?   (If response is "NO", the following Medicare IM given date fields will be blank) Date Medicare IM given:   Date Additional Medicare IM given:    Discharge Disposition:  [place]  Per UR Regulation:    If discussed at Long Length of Stay Meetings, dates discussed:    Comments:  03/22/12 [place] BSN CM Spoke to Star. She spoke with pt's daughter Judeen Hammans who stated that inpt rehab was not an option due to the care issues with her mother/pt's wife. Per CSW, there are several bed offers for SNF. CSW to facilitate placement with pt and family.  03/21/12 [place], RN BSN CM

## 2012-03-21 NOTE — Progress Notes (Signed)
Subjective: 1 Day Post-Op Procedure(s) (LRB): OPEN REDUCTION INTERNAL FIXATION HIP (Right) Patient reports pain as 4 on 0-10 scale.    Objective: Vital signs in last 24 hours: Temp:  [97.7 F (36.5 C)-98.7 F (37.1 C)] 98.1 F (36.7 C) (07/31 0500) Pulse Rate:  [65-86] 66  (07/31 0500) Resp:  [12-20] 20  (07/31 0500) BP: (97-198)/(55-112) 97/55 mmHg (07/31 0500) SpO2:  [92 %-99 %] 95 % (07/31 0500) Weight:  [99.791 kg (220 lb)-106.096 kg (233 lb 14.4 oz)] 106.096 kg (233 lb 14.4 oz) (07/31 0444)  Intake/Output from previous day: 07/30 0701 - 07/31 0700 In: 2823.7 [I.V.:2621.7; IV Piggyback:202] Out: 750 [Urine:450; Blood:300] Intake/Output this shift:     Basename 03/21/12 0506 03/20/12 0820  HGB 11.3* 14.3    Basename 03/21/12 0506 03/20/12 0820  WBC 9.1 8.6  RBC 3.75* 4.69  HCT 33.7* 41.8  PLT 131* 147*    Basename 03/21/12 0506 03/20/12 0820  NA 135 144  K 5.5* 5.1  CL 104 106  CO2 24 28  BUN 37* 34*  CREATININE 2.11* 1.63*  GLUCOSE 190* 198*  CALCIUM 9.6 10.6*    Basename 03/20/12 0820  LABPT --  INR 0.99    Neurologically intact Neurovascular intact Sensation intact distally Intact pulses distally Dorsiflexion/Plantar flexion intact Incision: scant drainage  Hemovac removed.  He is passing his urine well without catheter.  Plan PT today.  Assessment/Plan: 1 Day Post-Op Procedure(s) (LRB): OPEN REDUCTION INTERNAL FIXATION HIP (Right) Up with therapy  I will be going out of town today and return August 5th.  Dr. Aline Brochure will be coming back tomorrow.  There will be a period of time of no orthopaedic coverage.  This was discussed yesterday with patient, his family and hospitalist.  They understood and were in agreement.  Patient will be on the hospitalist service.  Alona Danford 03/21/2012, 7:28 AM

## 2012-03-21 NOTE — Anesthesia Postprocedure Evaluation (Signed)
  Anesthesia Post-op Note  Patient: Zachary Mcmahon  Procedure(s) Performed: Procedure(s) (LRB): OPEN REDUCTION INTERNAL FIXATION HIP (Right)  Patient Location: room 316  Anesthesia Type: Spinal  Level of Consciousness: awake, alert , oriented and patient cooperative  Airway and Oxygen Therapy: Patient Spontanous Breathing and Patient connected to nasal cannula oxygen  Post-op Pain: 2 /10, mild  Post-op Assessment: Post-op Vital signs reviewed, Patient's Cardiovascular Status Stable, Respiratory Function Stable, Patent Airway, No signs of Nausea or vomiting and Pain level controlled  Post-op Vital Signs: Reviewed and stable  Complications: No apparent anesthesia complications

## 2012-03-22 ENCOUNTER — Encounter (HOSPITAL_COMMUNITY): Payer: Self-pay | Admitting: Orthopaedic Surgery

## 2012-03-22 LAB — GLUCOSE, CAPILLARY
Glucose-Capillary: 162 mg/dL — ABNORMAL HIGH (ref 70–99)
Glucose-Capillary: 118 mg/dL — ABNORMAL HIGH (ref 70–99)
Glucose-Capillary: 152 mg/dL — ABNORMAL HIGH (ref 70–99)

## 2012-03-22 LAB — BASIC METABOLIC PANEL
BUN: 40 mg/dL — ABNORMAL HIGH (ref 6–23)
Calcium: 9.7 mg/dL (ref 8.4–10.5)
Creatinine, Ser: 1.94 mg/dL — ABNORMAL HIGH (ref 0.50–1.35)
GFR calc Af Amer: 37 mL/min — ABNORMAL LOW (ref >90)

## 2012-03-22 LAB — CBC
MCHC: 33.3 g/dL (ref 30.0–36.0)
MCV: 88.7 fL (ref 78.0–100.0)
Platelets: 136 10*3/uL — ABNORMAL LOW (ref 150–400)
RDW: 13.8 % (ref 11.5–15.5)
WBC: 10 10*3/uL (ref 4.0–10.5)

## 2012-03-22 MED ORDER — DOCUSATE SODIUM 100 MG PO CAPS
100.0000 mg | ORAL_CAPSULE | Freq: Two times a day (BID) | ORAL | Status: DC
  Administered 2012-03-22 – 2012-03-23 (×3): 100 mg via ORAL
  Filled 2012-03-22 (×3): qty 1

## 2012-03-22 MED ORDER — ZOLPIDEM TARTRATE 5 MG PO TABS: 5.0000 mg | ORAL_TABLET | Freq: Every evening | ORAL | Status: DC | PRN

## 2012-03-22 NOTE — Progress Notes (Signed)
Physical Therapy Treatment Patient Details Name: Zachary Mcmahon MRN: [id number] DOB: April 05, 1932 Today's Date: 03/22/2012 Time: [id number] PT Time Calculation (min): 41 min  PT Assessment / Plan / Recommendation Comments on Treatment Session  Pt continues to be very drowsy, even though IV meds were d/c'd yesterday.  He does awaken easily but has trouble staying awake.  There has been no improvement in functional status.  I had expected that he would have been able to ambulate a bit today, but it was all he could do to transfer bed to chair with the walker.  I think that his progress is far too slow to be appropriate for CIR.            Follow Up Recommendations       Barriers to Discharge        Equipment Recommendations       Recommendations for Other Services    Frequency     Plan Discharge plan remains appropriate;Frequency remains appropriate    Precautions / Restrictions Restrictions Weight Bearing Restrictions: Yes RLE Weight Bearing: Touchdown weight bearing   Pertinent Vitals/Pain     Mobility  Bed Mobility Supine to Sit: 1: +1 Total assist;HOB elevated Details for Bed Mobility Assistance: pt is still totally unable to assist in moving trunk or LEs in the bed Transfers Sit to Stand: 2: Max assist;With upper extremity assist;With armrests;From bed;From elevated surface;Other (comment) (HOB needed to be almost maximally elevated) Stand to Sit: 2: Max assist;To chair/3-in-1;To bed Details for Transfer Assistance: pt has great difficulty standing erect  with walker and tends to "fall" into the bed or chair as soon as possible Ambulation/Gait Ambulation/Gait Assistance: 2: Max assist Ambulation Distance (Feet): 2 Feet Assistive device: Rolling walker General Gait Details: has significant difficulty off loading weight of LLE in order to step with this leg Stairs: No Wheelchair Mobility Wheelchair Mobility: No    Exercises General Exercises - Lower Extremity Ankle  Circles/Pumps: AROM;Both;10 reps;Supine Quad Sets: AROM;Both;10 reps;Supine Gluteal Sets: AROM;Both;10 reps;Supine Short Arc Quad: AAROM;Right;10 reps Heel Slides: AAROM;Right;10 reps;Supine Hip ABduction/ADduction: AROM;Right;10 reps;Supine   PT Diagnosis:    PT Problem List:   PT Treatment Interventions:     PT Goals Acute Rehab PT Goals PT Goal: Supine/Side to Sit - Progress: Not progressing PT Goal: Sit to Supine/Side - Progress: Not progressing PT Goal: Sit to Stand - Progress: Not progressing PT Goal: Stand to Sit - Progress: Not progressing PT Goal: Ambulate - Progress: Not progressing  Visit Information  Last PT Received On: 03/22/12    Subjective Data  Subjective: no c/o   Cognition       Balance     End of Session PT - End of Session Equipment Utilized During Treatment: Gait belt Activity Tolerance: Patient limited by fatigue Patient left: in chair;with call bell/phone within reach;with chair alarm set Nurse Communication: Mobility status   GP     [name] 03/22/2012, 9:25 AM

## 2012-03-22 NOTE — Clinical Social Work Note (Signed)
Spoke w daughter, Raymon Mutton, by phone regarding family desires for SNF placement for father and care for mother.  Says that father comes from [place], so feels that father would prefer SNF placement at [facility] rather than in [place].  Asked family to contact [facility] and sign preadmission paperwork for patient today in anticipation of possible discharge tomorrow.  Daughter states that father leaves mother home alone during the day, often as long as 8 hours at a time.  Mother needs reminders to eat but does not wander.  Daughter feels that between family and friends, they can provide adequate supervision for mother during time father is in SNF for rehab.  Will give daughter list of sitter agencies for back up in case family and friends cannot provide adequate assistance. Called Tami at [facility] and let her know of patient and family choice.   Justice Britain Clinical Social Worker [phone])

## 2012-03-22 NOTE — Progress Notes (Signed)
Physical Therapy Treatment Patient Details Name: Zachary Mcmahon MRN: [id number] DOB: Feb 19, 1932 Today's Date: 03/22/2012 Time: [id number] PT Time Calculation (min): 27 min  PT Assessment / Plan / Recommendation Comments on Treatment Session  Pt still extremely drowsy and now very confused.  Does not know where he is or why he is here.  He is unable to stay awake long enough to complete any exercise and is too confused to attempt trying a walker.    Follow Up Recommendations       Barriers to Discharge        Equipment Recommendations       Recommendations for Other Services    Frequency     Plan Discharge plan remains appropriate;Frequency remains appropriate    Precautions / Restrictions     Pertinent Vitals/Pain     Mobility  Bed Mobility Supine to Sit: 1: +1 Total assist;HOB elevated Sit to Supine: 2: Max assist;HOB flat Details for Bed Mobility Assistance: pt is still totally unable to assist in moving trunk or LEs in the bed Transfers Transfers: Stand Pivot Transfers Sit to Stand: 2: Max assist;With upper extremity assist;With armrests;From bed;From elevated surface;Other (comment) (HOB needed to be almost maximally elevated) Stand to Sit: 2: Max assist;To chair/3-in-1;To bed Stand Pivot Transfers: 2: Max assist Details for Transfer Assistance: Pt was able to stand, but had significant difficutly motor planning for turning to bed Ambulation/Gait Ambulation/Gait Assistance: Not tested (comment) (unable to use a walker due to drowsiness, confusion) Ambulation Distance (Feet): 2 Feet Assistive device: Rolling walker General Gait Details: has significant difficulty off loading weight of LLE in order to step with this leg Stairs: No Wheelchair Mobility Wheelchair Mobility: No    Exercises General Exercises - Lower Extremity Ankle Circles/Pumps: AROM;Both;10 reps;Supine Quad Sets: AROM;Both;10 reps;Supine Gluteal Sets: AROM;Both;10 reps;Supine Short Arc Quad:  AAROM;Right;10 reps Heel Slides: AAROM;Right;10 reps;Supine Hip ABduction/ADduction: AROM;Right;10 reps;Supine   PT Diagnosis:    PT Problem List:   PT Treatment Interventions:     PT Goals Acute Rehab PT Goals PT Goal: Supine/Side to Sit - Progress: Not progressing PT Goal: Sit to Supine/Side - Progress: Not progressing PT Goal: Sit to Stand - Progress: Not progressing PT Goal: Stand to Sit - Progress: Not progressing PT Goal: Ambulate - Progress: Not progressing  Visit Information  Last PT Received On: 03/22/12    Subjective Data  Subjective: Pt thinks that I am from the [employer]  Overall Cognitive Status: Impaired Area of Impairment: Attention;Memory;Safety/judgement;Awareness of errors;Awareness of deficits;Problem solving;Executive functioning Arousal/Alertness: Lethargic Orientation Level: Place;Time;Situation Behavior During Session: Lethargic    Balance     End of Session PT - End of Session Equipment Utilized During Treatment: Gait belt Activity Tolerance: Patient limited by fatigue;Other (comment) (limited by confusion) Patient left: in bed;with call bell/phone within reach;with bed alarm set;with nursing in room Nurse Communication: Mobility status   GP     Zachary Mcmahon 03/22/2012, 1:02 PM

## 2012-03-22 NOTE — Clinical Social Work Note (Signed)
CSW met w patient at bedside to present current bed offers from [facility], [facility], and [facility].  Patient very sleepy and questionably oriented.   After much prompting, patient stated he would like to stay in [place] and accept bed offer from [facility], but CSW will revisit patient later to confirm that this is his choice.  CSW will attempt to contact family members to ascertain family plans for care of wife w dementia currently living at home and cared for by patient.  At this time, patient does not seem able to participate in making a plan for care of wife while he is in SNF for rehab.  Per patient, wife cannot be left alone.  Justice Britain Clinical Social Worker [phone])

## 2012-03-22 NOTE — Progress Notes (Signed)
Chart reviewed. Discussed with Dr. Luna Glasgow.  Subjective: Less nauseated. No bowel movement yet. Transitioned to oral pain medications.  Objective: Vital signs in last 24 hours: Filed Vitals:   03/21/12 1505 03/21/12 2121 03/21/12 2131 03/22/12 0528  BP: 143/66 158/89  121/74  Pulse: 68 101 101 89  Temp: 98.1 F (36.7 C)  98.3 F (36.8 C) 98.6 F (37 C)  TempSrc:   Oral Oral  Resp: 18  18 18   Height:      Weight:      SpO2: 97% 95%  90%   Weight change:   Intake/Output Summary (Last 24 hours) at 03/22/12 0737 Last data filed at 03/22/12 0239  Gross per 24 hour  Intake    240 ml  Output    675 ml  Net   -435 ml   Gen. alert, appropriate.  Lungs clear to auscultation bilaterally without wheeze rhonchi or rales Cardiovascular regular rate rhythm without murmurs gallops rubs Abdomen soft nontender nondistended Extremities no clubbing cyanosis or edema  Lab Results: Basic Metabolic Panel:  Lab [id number] 03/21/12 0506  NA 136 135  K 4.1 5.5*  CL 104 104  CO2 24 24  GLUCOSE 144* 190*  BUN 40* 37*  CREATININE 1.94* 2.11*  CALCIUM 9.7 9.6  MG -- --  PHOS -- --   Liver Function Tests:  Lab 03/21/12 0506  AST 18  ALT 10  ALKPHOS 76  BILITOT 0.8  PROT 6.0  ALBUMIN 3.4*   No results found for this basename: LIPASE:2,AMYLASE:2 in the last 168 hours No results found for this basename: AMMONIA:2 in the last 168 hours CBC:  Lab 03/22/12 0437 03/21/12 0506 03/20/12 0820  WBC 10.0 9.1 --  NEUTROABS -- -- 6.6  HGB 10.5* 11.3* --  HCT 31.5* 33.7* --  MCV 88.7 89.9 --  PLT 136* 131* --   Cardiac Enzymes: No results found for this basename: CKTOTAL:3,CKMB:3,CKMBINDEX:3,TROPONINI:3 in the last 168 hours BNP: No results found for this basename: PROBNP:3 in the last 168 hours D-Dimer: No results found for this basename: DDIMER:2 in the last 168 hours CBG:  Lab 03/22/12 0717 03/21/12 2036 03/21/12 1700 03/21/12 1114 03/21/12 0711 03/20/12 2034  GLUCAP 162*  160* 143* 153* 175* 217*   Hemoglobin A1C:  Lab 03/20/12 0820  HGBA1C 7.0*   Fasting Lipid Panel: No results found for this basename: CHOL,HDL,LDLCALC,TRIG,CHOLHDL,LDLDIRECT in the last 168 hours Thyroid Function Tests:  Lab 03/20/12 0820  TSH 1.634  T4TOTAL --  FREET4 --  T3FREE --  THYROIDAB --   Coagulation:  Lab 03/20/12 0820  LABPROT 13.3  INR 0.99   Anemia Panel:  Lab 03/20/12 0820  VITAMINB12 291  FOLATE --  FERRITIN --  TIBC --  IRON --  RETICCTPCT --   Urine Drug Screen: Drugs of Abuse  No results found for this basename: labopia,  cocainscrnur,  labbenz,  amphetmu,  thcu,  labbarb    Alcohol Level: No results found for this basename: ETH:2 in the last 168 hours Urinalysis: No results found for this basename: COLORURINE:2,APPERANCEUR:2,LABSPEC:2,PHURINE:2,GLUCOSEU:2,HGBUR:2,BILIRUBINUR:2,KETONESUR:2,PROTEINUR:2,UROBILINOGEN:2,NITRITE:2,LEUKOCYTESUR:2 in the last 168 hours  Micro Results: Recent Results (from the past 240 hour(s))  SURGICAL PCR SCREEN     Status: Abnormal   Collection Time   03/20/12 12:05 PM      Component Value Range Status Comment   MRSA, PCR NEGATIVE  NEGATIVE Final    Staphylococcus aureus POSITIVE (*) NEGATIVE Final    Studies/Results: Dg Chest 1 View  03/20/2012  *RADIOLOGY REPORT*  Clinical Data: [place] this morning.  CHEST - 1 VIEW  Comparison: None.  Findings: Enlarged cardiac silhouette.  Clear lungs.  Small left diaphragmatic eventration.  Thoracic spine degenerative changes. No fracture or pneumothorax seen.  IMPRESSION: Cardiomegaly.  No acute abnormality.  Original Report Authenticated By: Gerald Stabs, M.D.   Dg Hip Complete Right  03/20/2012  *RADIOLOGY REPORT*  Clinical Data: Right hip pain following a fall this morning.  RIGHT HIP - COMPLETE 2+ VIEW  Comparison: None.  Findings: Poorly visualized right intertrochanteric fracture with mild posterior angulation of the distal fragment.  No significant displacement.   Lower lumbar spine degenerative changes.  IMPRESSION: Right intertrochanteric fracture.  Original Report Authenticated By: Gerald Stabs, M.D.   Dg Hip Operative Right  03/20/2012  *RADIOLOGY REPORT*  Clinical Data: ORIF of intertrochanteric fracture  DG OPERATIVE RIGHT HIP  Comparison: Preoperative radiographs obtained earlier today, 03/20/2012 at 08:42 a.m.  Findings: Multiple fluoroscopic spot images obtained during dynamic hip screw and lateral buttress plate and screw fixation of the intertrochanteric fracture.  No evidence of immediate hardware complication, or periprosthetic fracture.  IMPRESSION: Intraoperative spot images obtained during ORIF of a right hip intertrochanteric fracture as detailed above.  Please note images only were submitted for radiologic interpretation.  Please see operative note for fluoroscopy dose, and additional details.  Original Report Authenticated By: Myrle Sheng   Scheduled Meds:    . acetaminophen  1,000 mg Intravenous Q6H  . antiseptic oral rinse  15 mL Mouth Rinse BID  . docusate sodium  100 mg Oral BID  . enoxaparin (LOVENOX) injection  40 mg Subcutaneous Q24H  . insulin aspart  0-5 Units Subcutaneous QHS  . insulin aspart  0-9 Units Subcutaneous TID WC  . insulin detemir  54 Units Subcutaneous QHS  . labetalol  300 mg Oral BID  . senna  1 tablet Oral BID  . sodium polystyrene  30 g Oral Once  . Vitamin D (Ergocalciferol)  50,000 Units Oral Q7 days  . DISCONTD: morphine   Intravenous Q4H  . DISCONTD: NIFEdipine  60 mg Oral BID  . DISCONTD: zolpidem  5 mg Oral QHS   Continuous Infusions:    . sodium chloride 100 mL/hr at 03/21/12 1252  . DISCONTD: sodium chloride 100 mL/hr at 03/21/12 0215   PRN Meds:.acetaminophen, acetaminophen, albuterol, labetalol, ondansetron (ZOFRAN) IV, ondansetron, oxyCODONE-acetaminophen, zolpidem, DISCONTD: diphenhydrAMINE, DISCONTD: diphenhydrAMINE, DISCONTD: naloxone, DISCONTD: promethazine, DISCONTD: sodium  chloride Assessment/Plan: Principal Problem:  *Intertrochanteric fracture of right hip Active Problems:  Hyperkalemia resolved  HTN (hypertension) controlled  DM type 2 (diabetes mellitus, type 2) stable  Acute renal failure  Thrombocytopenia  Discontinue telemetry. Decrease IV fluids. Add Colace. Discharge planning.   LOS: 2 days   Butler Vegh L 03/22/2012, 7:37 AM

## 2012-03-23 ENCOUNTER — Inpatient Hospital Stay
Admission: RE | Admit: 2012-03-23 | Discharge: 2012-05-11 | Disposition: A | Discharge status: Home / Self Care | Payer: PRIVATE HEALTH INSURANCE | Source: Ambulatory Visit | Attending: Internal Medicine | Admitting: Internal Medicine

## 2012-03-23 DIAGNOSIS — T148XXA Other injury of unspecified body region, initial encounter: Secondary | ICD-10-CM

## 2012-03-23 DIAGNOSIS — R52 Pain, unspecified: Secondary | ICD-10-CM

## 2012-03-23 DIAGNOSIS — D649 Anemia, unspecified: Secondary | ICD-10-CM | POA: Diagnosis not present

## 2012-03-23 DIAGNOSIS — K5901 Slow transit constipation: Secondary | ICD-10-CM | POA: Diagnosis not present

## 2012-03-23 DIAGNOSIS — Z5189 Encounter for other specified aftercare: Secondary | ICD-10-CM | POA: Diagnosis not present

## 2012-03-23 DIAGNOSIS — S72143A Displaced intertrochanteric fracture of unspecified femur, initial encounter for closed fracture: Secondary | ICD-10-CM | POA: Diagnosis not present

## 2012-03-23 DIAGNOSIS — Z9181 History of falling: Secondary | ICD-10-CM | POA: Diagnosis not present

## 2012-03-23 DIAGNOSIS — N189 Chronic kidney disease, unspecified: Secondary | ICD-10-CM | POA: Diagnosis not present

## 2012-03-23 DIAGNOSIS — M810 Age-related osteoporosis without current pathological fracture: Secondary | ICD-10-CM | POA: Diagnosis not present

## 2012-03-23 DIAGNOSIS — R9431 Abnormal electrocardiogram [ECG] [EKG]: Secondary | ICD-10-CM | POA: Diagnosis not present

## 2012-03-23 DIAGNOSIS — N179 Acute kidney failure, unspecified: Secondary | ICD-10-CM | POA: Diagnosis not present

## 2012-03-23 DIAGNOSIS — I517 Cardiomegaly: Secondary | ICD-10-CM | POA: Diagnosis not present

## 2012-03-23 DIAGNOSIS — M79606 Pain in leg, unspecified: Secondary | ICD-10-CM

## 2012-03-23 DIAGNOSIS — S72009A Fracture of unspecified part of neck of unspecified femur, initial encounter for closed fracture: Secondary | ICD-10-CM | POA: Diagnosis not present

## 2012-03-23 DIAGNOSIS — M25559 Pain in unspecified hip: Secondary | ICD-10-CM | POA: Diagnosis not present

## 2012-03-23 DIAGNOSIS — M84453A Pathological fracture, unspecified femur, initial encounter for fracture: Secondary | ICD-10-CM | POA: Diagnosis not present

## 2012-03-23 DIAGNOSIS — I2510 Atherosclerotic heart disease of native coronary artery without angina pectoris: Secondary | ICD-10-CM | POA: Diagnosis not present

## 2012-03-23 DIAGNOSIS — M25569 Pain in unspecified knee: Secondary | ICD-10-CM | POA: Diagnosis not present

## 2012-03-23 DIAGNOSIS — M7989 Other specified soft tissue disorders: Secondary | ICD-10-CM | POA: Diagnosis not present

## 2012-03-23 DIAGNOSIS — R609 Edema, unspecified: Secondary | ICD-10-CM

## 2012-03-23 DIAGNOSIS — D696 Thrombocytopenia, unspecified: Secondary | ICD-10-CM | POA: Diagnosis not present

## 2012-03-23 DIAGNOSIS — I82819 Embolism and thrombosis of superficial veins of unspecified lower extremities: Secondary | ICD-10-CM | POA: Diagnosis not present

## 2012-03-23 DIAGNOSIS — S72009D Fracture of unspecified part of neck of unspecified femur, subsequent encounter for closed fracture with routine healing: Secondary | ICD-10-CM | POA: Diagnosis not present

## 2012-03-23 DIAGNOSIS — E119 Type 2 diabetes mellitus without complications: Secondary | ICD-10-CM | POA: Diagnosis not present

## 2012-03-23 DIAGNOSIS — I509 Heart failure, unspecified: Secondary | ICD-10-CM | POA: Diagnosis not present

## 2012-03-23 DIAGNOSIS — E875 Hyperkalemia: Secondary | ICD-10-CM | POA: Diagnosis not present

## 2012-03-23 DIAGNOSIS — M79609 Pain in unspecified limb: Secondary | ICD-10-CM | POA: Diagnosis not present

## 2012-03-23 DIAGNOSIS — E1129 Type 2 diabetes mellitus with other diabetic kidney complication: Secondary | ICD-10-CM | POA: Diagnosis not present

## 2012-03-23 DIAGNOSIS — L290 Pruritus ani: Secondary | ICD-10-CM | POA: Diagnosis not present

## 2012-03-23 DIAGNOSIS — IMO0002 Reserved for concepts with insufficient information to code with codable children: Secondary | ICD-10-CM | POA: Diagnosis not present

## 2012-03-23 DIAGNOSIS — I10 Essential (primary) hypertension: Secondary | ICD-10-CM | POA: Diagnosis not present

## 2012-03-23 LAB — CBC
Hemoglobin: 10.1 g/dL — ABNORMAL LOW (ref 13.0–17.0)
MCH: 30.5 pg (ref 26.0–34.0)
Platelets: 136 10*3/uL — ABNORMAL LOW (ref 150–400)
RBC: 3.31 MIL/uL — ABNORMAL LOW (ref 4.22–5.81)
WBC: 9.3 10*3/uL (ref 4.0–10.5)

## 2012-03-23 LAB — BASIC METABOLIC PANEL
Calcium: 9.9 mg/dL (ref 8.4–10.5)
GFR calc Af Amer: 40 mL/min — ABNORMAL LOW (ref >90)
GFR calc non Af Amer: 35 mL/min — ABNORMAL LOW (ref >90)
Glucose, Bld: 129 mg/dL — ABNORMAL HIGH (ref 70–99)
Potassium: 4.2 mEq/L (ref 3.5–5.1)
Sodium: 138 mEq/L (ref 135–145)

## 2012-03-23 LAB — GLUCOSE, CAPILLARY: Glucose-Capillary: 102 mg/dL — ABNORMAL HIGH (ref 70–99)

## 2012-03-23 MED ORDER — ENOXAPARIN SODIUM 40 MG/0.4ML ~~LOC~~ SOLN: 40.0000 mg | SUBCUTANEOUS | Status: DC

## 2012-03-23 MED ORDER — NIFEDIPINE ER OSMOTIC RELEASE 60 MG PO TB24: 60.0000 mg | ORAL_TABLET | Freq: Every day | ORAL | Status: AC

## 2012-03-23 MED ORDER — POLYETHYLENE GLYCOL 3350 17 G PO PACK
17.0000 g | PACK | Freq: Every day | ORAL | Status: DC
  Administered 2012-03-23: 17 g via ORAL
  Filled 2012-03-23: qty 1

## 2012-03-23 MED ORDER — SENNA 8.6 MG PO TABS: 1.0000 | ORAL_TABLET | Freq: Two times a day (BID) | ORAL | Status: DC

## 2012-03-23 MED ORDER — ONDANSETRON HCL 4 MG PO TABS: 4.0000 mg | ORAL_TABLET | Freq: Four times a day (QID) | ORAL | Status: AC | PRN

## 2012-03-23 MED ORDER — INSULIN ASPART 100 UNIT/ML ~~LOC~~ SOLN: 0.0000 [IU] | Freq: Three times a day (TID) | SUBCUTANEOUS | Status: DC

## 2012-03-23 MED ORDER — ACETAMINOPHEN 325 MG PO TABS: 650.0000 mg | ORAL_TABLET | Freq: Four times a day (QID) | ORAL | Status: AC | PRN

## 2012-03-23 MED ORDER — INSULIN ASPART 100 UNIT/ML ~~LOC~~ SOLN: 0.0000 [IU] | Freq: Every day | SUBCUTANEOUS | Status: DC

## 2012-03-23 MED ORDER — POLYETHYLENE GLYCOL 3350 17 G PO PACK: 17.0000 g | PACK | Freq: Every day | ORAL | Status: AC

## 2012-03-23 MED ORDER — OXYCODONE-ACETAMINOPHEN 5-325 MG PO TABS: 1.0000 | ORAL_TABLET | ORAL | Status: AC | PRN

## 2012-03-23 NOTE — Discharge Summary (Signed)
Physician Discharge Summary  Patient ID: Zachary Mcmahon MRN: [id number] DOB/AGE: [birth year] 78 y.o.  Admit date: 03/20/2012 Discharge date: 03/23/2012  Discharge Diagnoses:  Principal Problem:  *Intertrochanteric fracture of right hip Active Problems:  Hyperkalemia  HTN (hypertension), malignant  DM type 2 (diabetes mellitus, type 2)  Acute renal failure  Thrombocytopenia   Medication List  As of 03/23/2012  9:11 AM   STOP taking these medications         lisinopril 40 MG tablet         TAKE these medications         acetaminophen 325 MG tablet   Commonly known as: TYLENOL   Take 2 tablets (650 mg total) by mouth every 6 (six) hours as needed (or Fever >/= 101).      aspirin EC 81 MG tablet   Take 81 mg by mouth every morning.      enoxaparin 40 MG/0.4ML injection   Commonly known as: LOVENOX   Inject 0.4 mLs (40 mg total) into the skin daily.      furosemide 20 MG tablet   Commonly known as: LASIX   Take 20 mg by mouth every morning.      insulin aspart 100 UNIT/ML injection   Commonly known as: novoLOG   Inject 0-5 Units into the skin at bedtime.      insulin aspart 100 UNIT/ML injection   Commonly known as: novoLOG   Inject 0-9 Units into the skin 3 (three) times daily with meals.      insulin detemir 100 UNIT/ML injection   Commonly known as: LEVEMIR   Inject 54 Units into the skin at bedtime.      labetalol 300 MG tablet   Commonly known as: NORMODYNE   Take 300 mg by mouth 2 (two) times daily.      NIFEdipine 60 MG 24 hr tablet   Commonly known as: PROCARDIA XL/ADALAT-CC   Take 1 tablet (60 mg total) by mouth daily.      ondansetron 4 MG tablet   Commonly known as: ZOFRAN   Take 1 tablet (4 mg total) by mouth every 6 (six) hours as needed for nausea.      oxyCODONE-acetaminophen 5-325 MG per tablet   Commonly known as: PERCOCET/ROXICET   Take 1-2 tablets by mouth every 4 (four) hours as needed (severe pain).      polyethylene glycol packet   Commonly known as: MIRALAX / GLYCOLAX   Take 17 g by mouth daily.      senna 8.6 MG Tabs   Commonly known as: SENOKOT   Take 1 tablet (8.6 mg total) by mouth 2 (two) times daily.      Vitamin D (Ergocalciferol) 50000 UNITS Caps   Commonly known as: DRISDOL   Take 50,000 Units by mouth every 7 (seven) days. On Tuesdays            Discharge Orders    Future Orders Please Complete By Expires   Diet - low sodium heart healthy      Diet Carb Modified      May shower / Bathe      Discharge instructions      Comments:   Cover incision with gauze and change daily.  Remove staples on 8/10. Toe touch weight bearing.      Disposition: SNF  Discharged Condition: stable  Consults: Treatment Team:  Carole Civil, MD  Labs:    Sodium      135  136 138  Potassium      5.5 4.1  4.2    Chloride      104 104 106    CO2      24 24 25     Mean Plasma Glucose      154      BUN      37 40 43    Creatinine, Ser      2.11 1.94 1.80    Calcium      9.6 9.7 9.9    GFR calc non Af Amer      29 32 35    GFR calc Af Amer      33  37  40     Glucose, Bld      190 144 129    Alkaline Phosphatase      76      Albumin      3.4      AST      18      ALT      10      Total Protein      6.0      Total Bilirubin      0.8       OTHER CHEM    Vitamin B-12      291       CBC    WBC      9.1 10.0 9.3    RBC      3.75 3.55 3.31    Hemoglobin      11.3  10.5 10.1    HCT      33.7 31.5 29.2    MCV      89.9 88.7 88.2    MCH      30.1 29.6 30.5    MCHC      33.5 33.3 34.6    RDW      14.0 13.8 13.5    Platelets      131 136 136     DIFFERENTIAL    Neutrophils Relative      76      Lymphocytes Relative      14      Monocytes Relative      7      Eosinophils Relative      3      Basophils Relative      1      Neutro Abs      6.6      Lymphs Abs      1.2      Monocytes Absolute      0.6      Eosinophils Absolute      0.3      Basophils Absolute      0.0       PROTIME W/  INR    Prothrombin Time      13.3      INR      0.99       DIABETES    Hemoglobin A1C      =6.5% Diagnostic of Diabetes Mellitus (if abnormal result is confirmed) 5.7-6.4% Increased risk of developing Diabetes Mellitus References:Diagnosis and Classification of Diabetes Mellitus,Diabetes [id number] 1):S62-S69 and Standards of Medical Care in ..."7.0 =6.5% Diagnostic of Diabetes Mellitus (if abnormal result is confirmed) 5.7-6.4% Increased risk of developing Diabetes Mellitus References:Diagnosis and Classification of Diabetes Mellitus,Diabetes [id number] 1):S62-S69 and Standards of Medical Care in ..." border=0 src="[email or web address]:/[email or web address]" width=5 height=10      Glucose, Bld  190 144 129     THYROID    TSH      1.634     Diagnostics:  Dg Chest 1 View  03/20/2012  *RADIOLOGY REPORT*  Clinical Data: [place] this morning.  CHEST - 1 VIEW  Comparison: None.  Findings: Enlarged cardiac silhouette.  Clear lungs.  Small left diaphragmatic eventration.  Thoracic spine degenerative changes. No fracture or pneumothorax seen.  IMPRESSION: Cardiomegaly.  No acute abnormality.  Original Report Authenticated By: Gerald Stabs, M.D.   Dg Hip Complete Right  03/20/2012  *RADIOLOGY REPORT*  Clinical Data: Right hip pain following a fall this morning.  RIGHT HIP - COMPLETE 2+ VIEW  Comparison: None.  Findings: Poorly visualized right intertrochanteric fracture with mild posterior angulation of the distal fragment.  No significant displacement.  Lower lumbar spine degenerative changes.  IMPRESSION: Right intertrochanteric fracture.  Original Report Authenticated By: Gerald Stabs, M.D.   Dg Hip Operative Right  03/20/2012  *RADIOLOGY REPORT*  Clinical Data: ORIF of intertrochanteric fracture  DG OPERATIVE RIGHT HIP  Comparison: Preoperative radiographs obtained earlier today, 03/20/2012 at 08:42 a.m.  Findings: Multiple fluoroscopic  spot images obtained during dynamic hip screw and lateral buttress plate and screw fixation of the intertrochanteric fracture.  No evidence of immediate hardware complication, or periprosthetic fracture.  IMPRESSION: Intraoperative spot images obtained during ORIF of a right hip intertrochanteric fracture as detailed above.  Please note images only were submitted for radiologic interpretation.  Please see operative note for fluoroscopy dose, and additional details.  Original Report Authenticated By: Myrle Sheng    Procedures: ORIF right hip on 03/20/12 by Dr. Luna Glasgow  EKG: NSR  Full Code   Hospital Course:  See H&P for complete admission details. Mr. Lafrance is a active 78 year old white male who tripped and sustained a fracture of the right hip, intertrochanteric. He was admitted to the hospitalist service. Orthopedics consulted and repaired his hip fracture. Patient has worked with physical therapy and would benefit from skilled nursing facility. His potassium was slightly high and his ACE inhibitor was stopped. His procardia dose was decreased. He had a brief episode of delirium but this has resolved and he is back to baseline. He has been constipated and remains on a bowel regimen. He has been on Lovenox for DVT prophylaxis. His diabetes has been under control. He is stable for transfer to skilled nursing facility. Total time on the day of discharge greater than 30 minutes.  Discharge Exam:  Blood pressure 117/67, pulse 85, temperature 99.3 F (37.4 C), temperature source Oral, resp. rate 20, height 5' 9.5" (1.765 m), weight 106.096 kg (233 lb 14.4 oz), SpO2 98.00%.  General:  Alert, oriented and appropriate Lungs clear to auscultation bilaterally without wheeze rhonchi or rales Cardiovascular regular rate rhythm without murmurs gallops rubs Abdomen soft nontender nondistended Extremities no clubbing cyanosis or edema incision with scant serosanguineous drainage no  erythema  Signed: Nashawn Hillock L 03/23/2012, 9:11 AM

## 2012-03-23 NOTE — Clinical Social Work Placement (Signed)
    Clinical Social Work Department CLINICAL SOCIAL WORK PLACEMENT NOTE 03/23/2012  Patient:  Zachary Mcmahon,Zachary Mcmahon  Account Number:  [id number] Admit date:  03/20/2012  Clinical Social Worker:  Edwyna Shell, CLINICAL SOCIAL WORKER  Date/time:  03/21/2012 12:00 N  Clinical Social Work is seeking post-discharge placement for this patient at the following level of care:   SKILLED NURSING   (*CSW will update this form in Epic as items are completed)   03/21/2012  Patient/family provided with [place] Department of Clinical Social Work's list of facilities offering this level of care within the geographic area requested by the patient (or if unable, by the patient's family).  03/21/2012  Patient/family informed of their freedom to choose among providers that offer the needed level of care, that participate in Medicare, Medicaid or managed care program needed by the patient, have an available bed and are willing to accept the patient.  03/21/2012  Patient/family informed of [facility]' ownership interest in [facility], as well as of the fact that they are under no obligation to receive care at this facility.  PASARR submitted to EDS on 03/21/2012 PASARR number received from EDS on 03/21/2012  FL2 transmitted to all facilities in geographic area requested by pt/family on  03/21/2012 FL2 transmitted to all facilities within larger geographic area on   Patient informed that his/her managed care company has contracts with or will negotiate with  certain facilities, including the following:     Patient/family informed of bed offers received:  03/22/2012 Patient chooses bed at [facility] Physician recommends and patient chooses bed at    Patient to be transferred to [facility] on  03/23/2012 Patient to be transferred to facility by [facility] staff via tunnel  The following physician request were entered in Epic:   Additional Comments:  Justice Britain Clinical Social Worker [phone])

## 2012-03-23 NOTE — Clinical Social Work Note (Signed)
Patient ready for discharge to [facility].  Discharge summary faxed to facility via [facility].  FL2 reviewed w RN and updated.  Discharge packet prepared and placed for transport.  Family informed by VM for daughter, Raymon Mutton, and patient informed at bedside. Family and patient agreeable to transfer to [facility].  Facility informed and agreeable to receiving patient this AM.    Justice Britain Clinical Social Worker [phone])

## 2012-03-23 NOTE — Progress Notes (Signed)
Called report to Shirlean Mylar, RN at [facility].  Verbalized understanding. Pt dc'd to facility via nursing staff.  Schonewitz, Eulis Canner 03/23/2012

## 2012-03-26 DIAGNOSIS — M84453A Pathological fracture, unspecified femur, initial encounter for fracture: Secondary | ICD-10-CM | POA: Diagnosis not present

## 2012-03-26 LAB — GLUCOSE, CAPILLARY
Glucose-Capillary: 129 mg/dL — ABNORMAL HIGH (ref 70–99)
Glucose-Capillary: 139 mg/dL — ABNORMAL HIGH (ref 70–99)
Glucose-Capillary: 84 mg/dL (ref 70–99)
Glucose-Capillary: 51 mg/dL — ABNORMAL LOW (ref 70–99)
Glucose-Capillary: 212 mg/dL — ABNORMAL HIGH (ref 70–99)
Glucose-Capillary: 183 mg/dL — ABNORMAL HIGH (ref 70–99)

## 2012-03-27 LAB — GLUCOSE, CAPILLARY
Glucose-Capillary: 189 mg/dL — ABNORMAL HIGH (ref 70–99)
Glucose-Capillary: 209 mg/dL — ABNORMAL HIGH (ref 70–99)

## 2012-03-28 DIAGNOSIS — D649 Anemia, unspecified: Secondary | ICD-10-CM | POA: Diagnosis not present

## 2012-03-28 LAB — GLUCOSE, CAPILLARY
Glucose-Capillary: 158 mg/dL — ABNORMAL HIGH (ref 70–99)
Glucose-Capillary: 157 mg/dL — ABNORMAL HIGH (ref 70–99)
Glucose-Capillary: 200 mg/dL — ABNORMAL HIGH (ref 70–99)

## 2012-03-29 DIAGNOSIS — E1129 Type 2 diabetes mellitus with other diabetic kidney complication: Secondary | ICD-10-CM | POA: Diagnosis not present

## 2012-03-29 DIAGNOSIS — D649 Anemia, unspecified: Secondary | ICD-10-CM | POA: Diagnosis not present

## 2012-03-29 DIAGNOSIS — M810 Age-related osteoporosis without current pathological fracture: Secondary | ICD-10-CM | POA: Diagnosis not present

## 2012-03-29 DIAGNOSIS — R609 Edema, unspecified: Secondary | ICD-10-CM | POA: Diagnosis not present

## 2012-03-29 DIAGNOSIS — K5901 Slow transit constipation: Secondary | ICD-10-CM | POA: Diagnosis not present

## 2012-03-29 DIAGNOSIS — M84453A Pathological fracture, unspecified femur, initial encounter for fracture: Secondary | ICD-10-CM | POA: Diagnosis not present

## 2012-03-29 LAB — GLUCOSE, CAPILLARY
Glucose-Capillary: 155 mg/dL — ABNORMAL HIGH (ref 70–99)
Glucose-Capillary: 229 mg/dL — ABNORMAL HIGH (ref 70–99)

## 2012-03-30 ENCOUNTER — Ambulatory Visit (HOSPITAL_COMMUNITY): Payer: Medicare Other

## 2012-03-30 ENCOUNTER — Ambulatory Visit (HOSPITAL_COMMUNITY)
Admit: 2012-03-30 | Discharge: 2012-03-30 | Disposition: A | Discharge status: Home / Self Care | Payer: Medicare Other | Source: Ambulatory Visit | Attending: Internal Medicine | Admitting: Internal Medicine

## 2012-03-30 DIAGNOSIS — R609 Edema, unspecified: Secondary | ICD-10-CM | POA: Diagnosis not present

## 2012-03-30 DIAGNOSIS — M7730 Calcaneal spur, unspecified foot: Secondary | ICD-10-CM | POA: Insufficient documentation

## 2012-03-30 DIAGNOSIS — M79609 Pain in unspecified limb: Secondary | ICD-10-CM | POA: Insufficient documentation

## 2012-03-30 DIAGNOSIS — M7989 Other specified soft tissue disorders: Secondary | ICD-10-CM | POA: Insufficient documentation

## 2012-03-30 DIAGNOSIS — M7120 Synovial cyst of popliteal space [Baker], unspecified knee: Secondary | ICD-10-CM | POA: Insufficient documentation

## 2012-03-30 LAB — GLUCOSE, CAPILLARY
Glucose-Capillary: 208 mg/dL — ABNORMAL HIGH (ref 70–99)
Glucose-Capillary: 168 mg/dL — ABNORMAL HIGH (ref 70–99)

## 2012-04-02 DIAGNOSIS — R609 Edema, unspecified: Secondary | ICD-10-CM | POA: Diagnosis not present

## 2012-04-02 LAB — GLUCOSE, CAPILLARY
Glucose-Capillary: 234 mg/dL — ABNORMAL HIGH (ref 70–99)
Glucose-Capillary: 173 mg/dL — ABNORMAL HIGH (ref 70–99)
Glucose-Capillary: 257 mg/dL — ABNORMAL HIGH (ref 70–99)
Glucose-Capillary: 162 mg/dL — ABNORMAL HIGH (ref 70–99)
Glucose-Capillary: 204 mg/dL — ABNORMAL HIGH (ref 70–99)
Glucose-Capillary: 190 mg/dL — ABNORMAL HIGH (ref 70–99)

## 2012-04-03 LAB — GLUCOSE, CAPILLARY
Glucose-Capillary: 147 mg/dL — ABNORMAL HIGH (ref 70–99)
Glucose-Capillary: 198 mg/dL — ABNORMAL HIGH (ref 70–99)
Glucose-Capillary: 232 mg/dL — ABNORMAL HIGH (ref 70–99)

## 2012-04-04 DIAGNOSIS — M25569 Pain in unspecified knee: Secondary | ICD-10-CM | POA: Diagnosis not present

## 2012-04-04 DIAGNOSIS — S72143A Displaced intertrochanteric fracture of unspecified femur, initial encounter for closed fracture: Secondary | ICD-10-CM | POA: Diagnosis not present

## 2012-04-04 LAB — GLUCOSE, CAPILLARY: Glucose-Capillary: 200 mg/dL — ABNORMAL HIGH (ref 70–99)

## 2012-04-05 LAB — GLUCOSE, CAPILLARY
Glucose-Capillary: 153 mg/dL — ABNORMAL HIGH (ref 70–99)
Glucose-Capillary: 196 mg/dL — ABNORMAL HIGH (ref 70–99)

## 2012-04-06 LAB — GLUCOSE, CAPILLARY
Glucose-Capillary: 105 mg/dL — ABNORMAL HIGH (ref 70–99)
Glucose-Capillary: 172 mg/dL — ABNORMAL HIGH (ref 70–99)
Glucose-Capillary: 169 mg/dL — ABNORMAL HIGH (ref 70–99)

## 2012-04-07 LAB — GLUCOSE, CAPILLARY
Glucose-Capillary: 119 mg/dL — ABNORMAL HIGH (ref 70–99)
Glucose-Capillary: 199 mg/dL — ABNORMAL HIGH (ref 70–99)
Glucose-Capillary: 228 mg/dL — ABNORMAL HIGH (ref 70–99)

## 2012-04-08 LAB — GLUCOSE, CAPILLARY
Glucose-Capillary: 138 mg/dL — ABNORMAL HIGH (ref 70–99)
Glucose-Capillary: 202 mg/dL — ABNORMAL HIGH (ref 70–99)

## 2012-04-09 LAB — GLUCOSE, CAPILLARY: Glucose-Capillary: 193 mg/dL — ABNORMAL HIGH (ref 70–99)

## 2012-04-10 LAB — GLUCOSE, CAPILLARY
Glucose-Capillary: 120 mg/dL — ABNORMAL HIGH (ref 70–99)
Glucose-Capillary: 144 mg/dL — ABNORMAL HIGH (ref 70–99)

## 2012-04-11 DIAGNOSIS — R609 Edema, unspecified: Secondary | ICD-10-CM | POA: Diagnosis not present

## 2012-04-11 DIAGNOSIS — E1129 Type 2 diabetes mellitus with other diabetic kidney complication: Secondary | ICD-10-CM | POA: Diagnosis not present

## 2012-04-11 LAB — GLUCOSE, CAPILLARY: Glucose-Capillary: 210 mg/dL — ABNORMAL HIGH (ref 70–99)

## 2012-04-12 ENCOUNTER — Other Ambulatory Visit (HOSPITAL_COMMUNITY): Payer: Medicare Other

## 2012-04-12 ENCOUNTER — Ambulatory Visit (HOSPITAL_COMMUNITY)
Admit: 2012-04-12 | Discharge: 2012-04-12 | Disposition: A | Discharge status: Home / Self Care | Payer: Medicare Other | Source: Skilled Nursing Facility | Attending: Internal Medicine | Admitting: Internal Medicine

## 2012-04-12 DIAGNOSIS — R29898 Other symptoms and signs involving the musculoskeletal system: Secondary | ICD-10-CM | POA: Insufficient documentation

## 2012-04-12 DIAGNOSIS — M25559 Pain in unspecified hip: Secondary | ICD-10-CM | POA: Diagnosis not present

## 2012-04-12 DIAGNOSIS — Z96649 Presence of unspecified artificial hip joint: Secondary | ICD-10-CM | POA: Insufficient documentation

## 2012-04-12 LAB — GLUCOSE, CAPILLARY
Glucose-Capillary: 154 mg/dL — ABNORMAL HIGH (ref 70–99)
Glucose-Capillary: 74 mg/dL (ref 70–99)

## 2012-04-13 LAB — GLUCOSE, CAPILLARY: Glucose-Capillary: 177 mg/dL — ABNORMAL HIGH (ref 70–99)

## 2012-04-14 LAB — GLUCOSE, CAPILLARY: Glucose-Capillary: 119 mg/dL — ABNORMAL HIGH (ref 70–99)

## 2012-04-15 LAB — GLUCOSE, CAPILLARY
Glucose-Capillary: 121 mg/dL — ABNORMAL HIGH (ref 70–99)
Glucose-Capillary: 163 mg/dL — ABNORMAL HIGH (ref 70–99)

## 2012-04-16 ENCOUNTER — Ambulatory Visit (HOSPITAL_COMMUNITY)
Admit: 2012-04-16 | Discharge: 2012-04-16 | Disposition: A | Discharge status: Skilled Nursing Facility | Payer: Medicare Other | Source: Skilled Nursing Facility | Attending: Internal Medicine | Admitting: Internal Medicine

## 2012-04-16 ENCOUNTER — Inpatient Hospital Stay (HOSPITAL_COMMUNITY): Payer: Medicare Other

## 2012-04-16 DIAGNOSIS — R609 Edema, unspecified: Secondary | ICD-10-CM | POA: Diagnosis not present

## 2012-04-16 DIAGNOSIS — E1129 Type 2 diabetes mellitus with other diabetic kidney complication: Secondary | ICD-10-CM | POA: Diagnosis not present

## 2012-04-16 DIAGNOSIS — M79609 Pain in unspecified limb: Secondary | ICD-10-CM | POA: Insufficient documentation

## 2012-04-16 DIAGNOSIS — M7989 Other specified soft tissue disorders: Secondary | ICD-10-CM | POA: Insufficient documentation

## 2012-04-16 DIAGNOSIS — M7120 Synovial cyst of popliteal space [Baker], unspecified knee: Secondary | ICD-10-CM | POA: Insufficient documentation

## 2012-04-16 LAB — GLUCOSE, CAPILLARY: Glucose-Capillary: 146 mg/dL — ABNORMAL HIGH (ref 70–99)

## 2012-04-17 LAB — GLUCOSE, CAPILLARY
Glucose-Capillary: 161 mg/dL — ABNORMAL HIGH (ref 70–99)
Glucose-Capillary: 165 mg/dL — ABNORMAL HIGH (ref 70–99)

## 2012-04-18 ENCOUNTER — Ambulatory Visit (HOSPITAL_COMMUNITY): Payer: Medicare Other | Attending: Internal Medicine

## 2012-04-18 DIAGNOSIS — N189 Chronic kidney disease, unspecified: Secondary | ICD-10-CM | POA: Diagnosis not present

## 2012-04-18 DIAGNOSIS — I517 Cardiomegaly: Secondary | ICD-10-CM

## 2012-04-18 HISTORY — PX: TRANSTHORACIC ECHOCARDIOGRAM: SHX275

## 2012-04-18 LAB — GLUCOSE, CAPILLARY
Glucose-Capillary: 167 mg/dL — ABNORMAL HIGH (ref 70–99)
Glucose-Capillary: 173 mg/dL — ABNORMAL HIGH (ref 70–99)

## 2012-04-18 NOTE — Progress Notes (Signed)
*  PRELIMINARY RESULTS* Echocardiogram 2D Echocardiogram has been performed.  Tera Partridge 04/18/2012, 10:02 AM

## 2012-04-19 LAB — GLUCOSE, CAPILLARY: Glucose-Capillary: 145 mg/dL — ABNORMAL HIGH (ref 70–99)

## 2012-04-20 ENCOUNTER — Ambulatory Visit (HOSPITAL_COMMUNITY)
Admit: 2012-04-20 | Discharge: 2012-04-20 | Disposition: A | Discharge status: Skilled Nursing Facility | Payer: Medicare Other | Source: Skilled Nursing Facility | Attending: Internal Medicine | Admitting: Internal Medicine

## 2012-04-20 DIAGNOSIS — M7989 Other specified soft tissue disorders: Secondary | ICD-10-CM | POA: Diagnosis not present

## 2012-04-20 DIAGNOSIS — I82819 Embolism and thrombosis of superficial veins of unspecified lower extremities: Secondary | ICD-10-CM | POA: Diagnosis not present

## 2012-04-20 LAB — GLUCOSE, CAPILLARY: Glucose-Capillary: 134 mg/dL — ABNORMAL HIGH (ref 70–99)

## 2012-04-23 DIAGNOSIS — N189 Chronic kidney disease, unspecified: Secondary | ICD-10-CM | POA: Diagnosis not present

## 2012-04-23 DIAGNOSIS — R609 Edema, unspecified: Secondary | ICD-10-CM | POA: Diagnosis not present

## 2012-04-23 LAB — GLUCOSE, CAPILLARY
Glucose-Capillary: 206 mg/dL — ABNORMAL HIGH (ref 70–99)
Glucose-Capillary: 128 mg/dL — ABNORMAL HIGH (ref 70–99)
Glucose-Capillary: 154 mg/dL — ABNORMAL HIGH (ref 70–99)
Glucose-Capillary: 221 mg/dL — ABNORMAL HIGH (ref 70–99)

## 2012-04-24 ENCOUNTER — Ambulatory Visit (HOSPITAL_COMMUNITY)
Admit: 2012-04-24 | Discharge: 2012-04-24 | Disposition: A | Discharge status: Skilled Nursing Facility | Payer: Medicare Other | Source: Skilled Nursing Facility | Attending: Internal Medicine | Admitting: Internal Medicine

## 2012-04-24 DIAGNOSIS — M25559 Pain in unspecified hip: Secondary | ICD-10-CM | POA: Diagnosis not present

## 2012-04-24 LAB — GLUCOSE, CAPILLARY: Glucose-Capillary: 128 mg/dL — ABNORMAL HIGH (ref 70–99)

## 2012-04-25 DIAGNOSIS — I509 Heart failure, unspecified: Secondary | ICD-10-CM | POA: Diagnosis not present

## 2012-04-25 DIAGNOSIS — I2510 Atherosclerotic heart disease of native coronary artery without angina pectoris: Secondary | ICD-10-CM | POA: Diagnosis not present

## 2012-04-25 DIAGNOSIS — E1129 Type 2 diabetes mellitus with other diabetic kidney complication: Secondary | ICD-10-CM | POA: Diagnosis not present

## 2012-04-25 LAB — GLUCOSE, CAPILLARY
Glucose-Capillary: 115 mg/dL — ABNORMAL HIGH (ref 70–99)
Glucose-Capillary: 196 mg/dL — ABNORMAL HIGH (ref 70–99)

## 2012-04-26 LAB — GLUCOSE, CAPILLARY: Glucose-Capillary: 112 mg/dL — ABNORMAL HIGH (ref 70–99)

## 2012-04-27 LAB — GLUCOSE, CAPILLARY: Glucose-Capillary: 96 mg/dL (ref 70–99)

## 2012-04-28 LAB — GLUCOSE, CAPILLARY
Glucose-Capillary: 133 mg/dL — ABNORMAL HIGH (ref 70–99)
Glucose-Capillary: 183 mg/dL — ABNORMAL HIGH (ref 70–99)

## 2012-04-29 LAB — GLUCOSE, CAPILLARY
Glucose-Capillary: 176 mg/dL — ABNORMAL HIGH (ref 70–99)
Glucose-Capillary: 178 mg/dL — ABNORMAL HIGH (ref 70–99)

## 2012-04-30 LAB — GLUCOSE, CAPILLARY
Glucose-Capillary: 93 mg/dL (ref 70–99)
Glucose-Capillary: 183 mg/dL — ABNORMAL HIGH (ref 70–99)

## 2012-05-01 DIAGNOSIS — L290 Pruritus ani: Secondary | ICD-10-CM | POA: Diagnosis not present

## 2012-05-02 DIAGNOSIS — I509 Heart failure, unspecified: Secondary | ICD-10-CM | POA: Diagnosis not present

## 2012-05-02 DIAGNOSIS — N189 Chronic kidney disease, unspecified: Secondary | ICD-10-CM | POA: Diagnosis not present

## 2012-05-03 LAB — GLUCOSE, CAPILLARY: Glucose-Capillary: 128 mg/dL — ABNORMAL HIGH (ref 70–99)

## 2012-05-04 LAB — GLUCOSE, CAPILLARY
Glucose-Capillary: 194 mg/dL — ABNORMAL HIGH (ref 70–99)
Glucose-Capillary: 121 mg/dL — ABNORMAL HIGH (ref 70–99)
Glucose-Capillary: 232 mg/dL — ABNORMAL HIGH (ref 70–99)

## 2012-05-05 LAB — GLUCOSE, CAPILLARY
Glucose-Capillary: 115 mg/dL — ABNORMAL HIGH (ref 70–99)
Glucose-Capillary: 197 mg/dL — ABNORMAL HIGH (ref 70–99)

## 2012-05-06 LAB — GLUCOSE, CAPILLARY: Glucose-Capillary: 251 mg/dL — ABNORMAL HIGH (ref 70–99)

## 2012-05-07 DIAGNOSIS — N189 Chronic kidney disease, unspecified: Secondary | ICD-10-CM | POA: Diagnosis not present

## 2012-05-07 LAB — GLUCOSE, CAPILLARY: Glucose-Capillary: 199 mg/dL — ABNORMAL HIGH (ref 70–99)

## 2012-05-08 LAB — GLUCOSE, CAPILLARY
Glucose-Capillary: 136 mg/dL — ABNORMAL HIGH (ref 70–99)
Glucose-Capillary: 146 mg/dL — ABNORMAL HIGH (ref 70–99)

## 2012-05-09 DIAGNOSIS — I509 Heart failure, unspecified: Secondary | ICD-10-CM | POA: Diagnosis not present

## 2012-05-09 DIAGNOSIS — N189 Chronic kidney disease, unspecified: Secondary | ICD-10-CM | POA: Diagnosis not present

## 2012-05-09 LAB — GLUCOSE, CAPILLARY: Glucose-Capillary: 72 mg/dL (ref 70–99)

## 2012-05-11 DIAGNOSIS — R609 Edema, unspecified: Secondary | ICD-10-CM | POA: Diagnosis not present

## 2012-05-11 DIAGNOSIS — I10 Essential (primary) hypertension: Secondary | ICD-10-CM | POA: Diagnosis not present

## 2012-05-11 DIAGNOSIS — R9431 Abnormal electrocardiogram [ECG] [EKG]: Secondary | ICD-10-CM | POA: Diagnosis not present

## 2012-05-11 DIAGNOSIS — E119 Type 2 diabetes mellitus without complications: Secondary | ICD-10-CM | POA: Diagnosis not present

## 2012-05-11 LAB — GLUCOSE, CAPILLARY: Glucose-Capillary: 123 mg/dL — ABNORMAL HIGH (ref 70–99)

## 2012-05-14 DIAGNOSIS — E109 Type 1 diabetes mellitus without complications: Secondary | ICD-10-CM | POA: Diagnosis not present

## 2012-05-14 DIAGNOSIS — R269 Unspecified abnormalities of gait and mobility: Secondary | ICD-10-CM | POA: Diagnosis not present

## 2012-05-14 DIAGNOSIS — I10 Essential (primary) hypertension: Secondary | ICD-10-CM | POA: Diagnosis not present

## 2012-05-14 DIAGNOSIS — S7290XD Unspecified fracture of unspecified femur, subsequent encounter for closed fracture with routine healing: Secondary | ICD-10-CM | POA: Diagnosis not present

## 2012-05-14 DIAGNOSIS — Z794 Long term (current) use of insulin: Secondary | ICD-10-CM | POA: Diagnosis not present

## 2012-05-16 DIAGNOSIS — R269 Unspecified abnormalities of gait and mobility: Secondary | ICD-10-CM | POA: Diagnosis not present

## 2012-05-16 DIAGNOSIS — Z794 Long term (current) use of insulin: Secondary | ICD-10-CM | POA: Diagnosis not present

## 2012-05-16 DIAGNOSIS — S7290XD Unspecified fracture of unspecified femur, subsequent encounter for closed fracture with routine healing: Secondary | ICD-10-CM | POA: Diagnosis not present

## 2012-05-16 DIAGNOSIS — E109 Type 1 diabetes mellitus without complications: Secondary | ICD-10-CM | POA: Diagnosis not present

## 2012-05-16 DIAGNOSIS — I10 Essential (primary) hypertension: Secondary | ICD-10-CM | POA: Diagnosis not present

## 2012-05-18 DIAGNOSIS — S7290XD Unspecified fracture of unspecified femur, subsequent encounter for closed fracture with routine healing: Secondary | ICD-10-CM | POA: Diagnosis not present

## 2012-05-18 DIAGNOSIS — Z794 Long term (current) use of insulin: Secondary | ICD-10-CM | POA: Diagnosis not present

## 2012-05-18 DIAGNOSIS — E109 Type 1 diabetes mellitus without complications: Secondary | ICD-10-CM | POA: Diagnosis not present

## 2012-05-18 DIAGNOSIS — I10 Essential (primary) hypertension: Secondary | ICD-10-CM | POA: Diagnosis not present

## 2012-05-18 DIAGNOSIS — R269 Unspecified abnormalities of gait and mobility: Secondary | ICD-10-CM | POA: Diagnosis not present

## 2012-05-21 DIAGNOSIS — R269 Unspecified abnormalities of gait and mobility: Secondary | ICD-10-CM | POA: Diagnosis not present

## 2012-05-21 DIAGNOSIS — Z794 Long term (current) use of insulin: Secondary | ICD-10-CM | POA: Diagnosis not present

## 2012-05-21 DIAGNOSIS — E109 Type 1 diabetes mellitus without complications: Secondary | ICD-10-CM | POA: Diagnosis not present

## 2012-05-21 DIAGNOSIS — I10 Essential (primary) hypertension: Secondary | ICD-10-CM | POA: Diagnosis not present

## 2012-05-21 DIAGNOSIS — S7290XD Unspecified fracture of unspecified femur, subsequent encounter for closed fracture with routine healing: Secondary | ICD-10-CM | POA: Diagnosis not present

## 2012-05-23 DIAGNOSIS — I10 Essential (primary) hypertension: Secondary | ICD-10-CM | POA: Diagnosis not present

## 2012-05-23 DIAGNOSIS — S7290XD Unspecified fracture of unspecified femur, subsequent encounter for closed fracture with routine healing: Secondary | ICD-10-CM | POA: Diagnosis not present

## 2012-05-23 DIAGNOSIS — Z794 Long term (current) use of insulin: Secondary | ICD-10-CM | POA: Diagnosis not present

## 2012-05-23 DIAGNOSIS — E109 Type 1 diabetes mellitus without complications: Secondary | ICD-10-CM | POA: Diagnosis not present

## 2012-05-23 DIAGNOSIS — R269 Unspecified abnormalities of gait and mobility: Secondary | ICD-10-CM | POA: Diagnosis not present

## 2012-05-25 ENCOUNTER — Ambulatory Visit: Payer: Medicare Other | Admitting: Urology

## 2012-05-25 DIAGNOSIS — E109 Type 1 diabetes mellitus without complications: Secondary | ICD-10-CM | POA: Diagnosis not present

## 2012-05-25 DIAGNOSIS — E782 Mixed hyperlipidemia: Secondary | ICD-10-CM | POA: Diagnosis not present

## 2012-05-25 DIAGNOSIS — Z794 Long term (current) use of insulin: Secondary | ICD-10-CM | POA: Diagnosis not present

## 2012-05-25 DIAGNOSIS — R269 Unspecified abnormalities of gait and mobility: Secondary | ICD-10-CM | POA: Diagnosis not present

## 2012-05-25 DIAGNOSIS — I10 Essential (primary) hypertension: Secondary | ICD-10-CM | POA: Diagnosis not present

## 2012-05-25 DIAGNOSIS — Z79899 Other long term (current) drug therapy: Secondary | ICD-10-CM | POA: Diagnosis not present

## 2012-05-25 DIAGNOSIS — S7290XD Unspecified fracture of unspecified femur, subsequent encounter for closed fracture with routine healing: Secondary | ICD-10-CM | POA: Diagnosis not present

## 2012-05-28 DIAGNOSIS — R269 Unspecified abnormalities of gait and mobility: Secondary | ICD-10-CM | POA: Diagnosis not present

## 2012-05-28 DIAGNOSIS — Z794 Long term (current) use of insulin: Secondary | ICD-10-CM | POA: Diagnosis not present

## 2012-05-28 DIAGNOSIS — E109 Type 1 diabetes mellitus without complications: Secondary | ICD-10-CM | POA: Diagnosis not present

## 2012-05-28 DIAGNOSIS — I10 Essential (primary) hypertension: Secondary | ICD-10-CM | POA: Diagnosis not present

## 2012-05-28 DIAGNOSIS — S7290XD Unspecified fracture of unspecified femur, subsequent encounter for closed fracture with routine healing: Secondary | ICD-10-CM | POA: Diagnosis not present

## 2012-05-29 DIAGNOSIS — R9431 Abnormal electrocardiogram [ECG] [EKG]: Secondary | ICD-10-CM | POA: Diagnosis not present

## 2012-05-29 DIAGNOSIS — I10 Essential (primary) hypertension: Secondary | ICD-10-CM | POA: Diagnosis not present

## 2012-05-29 DIAGNOSIS — E109 Type 1 diabetes mellitus without complications: Secondary | ICD-10-CM | POA: Diagnosis not present

## 2012-05-29 DIAGNOSIS — R269 Unspecified abnormalities of gait and mobility: Secondary | ICD-10-CM | POA: Diagnosis not present

## 2012-05-29 DIAGNOSIS — Z794 Long term (current) use of insulin: Secondary | ICD-10-CM | POA: Diagnosis not present

## 2012-05-29 DIAGNOSIS — E669 Obesity, unspecified: Secondary | ICD-10-CM | POA: Diagnosis not present

## 2012-05-29 DIAGNOSIS — S7290XD Unspecified fracture of unspecified femur, subsequent encounter for closed fracture with routine healing: Secondary | ICD-10-CM | POA: Diagnosis not present

## 2012-05-29 HISTORY — PX: CARDIOVASCULAR STRESS TEST: SHX262

## 2012-05-29 HISTORY — PX: OTHER SURGICAL HISTORY: SHX169

## 2012-06-04 DIAGNOSIS — I2510 Atherosclerotic heart disease of native coronary artery without angina pectoris: Secondary | ICD-10-CM | POA: Diagnosis not present

## 2012-06-04 DIAGNOSIS — E119 Type 2 diabetes mellitus without complications: Secondary | ICD-10-CM | POA: Diagnosis not present

## 2012-06-04 DIAGNOSIS — I10 Essential (primary) hypertension: Secondary | ICD-10-CM | POA: Diagnosis not present

## 2012-06-06 DIAGNOSIS — R269 Unspecified abnormalities of gait and mobility: Secondary | ICD-10-CM | POA: Diagnosis not present

## 2012-06-06 DIAGNOSIS — E109 Type 1 diabetes mellitus without complications: Secondary | ICD-10-CM | POA: Diagnosis not present

## 2012-06-06 DIAGNOSIS — Z794 Long term (current) use of insulin: Secondary | ICD-10-CM | POA: Diagnosis not present

## 2012-06-06 DIAGNOSIS — I10 Essential (primary) hypertension: Secondary | ICD-10-CM | POA: Diagnosis not present

## 2012-06-06 DIAGNOSIS — S7290XD Unspecified fracture of unspecified femur, subsequent encounter for closed fracture with routine healing: Secondary | ICD-10-CM | POA: Diagnosis not present

## 2012-06-07 DIAGNOSIS — Z23 Encounter for immunization: Secondary | ICD-10-CM | POA: Diagnosis not present

## 2012-06-14 DIAGNOSIS — H5200 Hypermetropia, unspecified eye: Secondary | ICD-10-CM | POA: Diagnosis not present

## 2012-06-14 DIAGNOSIS — E11329 Type 2 diabetes mellitus with mild nonproliferative diabetic retinopathy without macular edema: Secondary | ICD-10-CM | POA: Diagnosis not present

## 2012-06-14 DIAGNOSIS — H524 Presbyopia: Secondary | ICD-10-CM | POA: Diagnosis not present

## 2012-06-14 DIAGNOSIS — H2589 Other age-related cataract: Secondary | ICD-10-CM | POA: Diagnosis not present

## 2012-06-22 DIAGNOSIS — M25569 Pain in unspecified knee: Secondary | ICD-10-CM | POA: Diagnosis not present

## 2012-06-22 DIAGNOSIS — K219 Gastro-esophageal reflux disease without esophagitis: Secondary | ICD-10-CM | POA: Diagnosis not present

## 2012-06-22 DIAGNOSIS — M1990 Unspecified osteoarthritis, unspecified site: Secondary | ICD-10-CM | POA: Diagnosis not present

## 2012-06-22 DIAGNOSIS — I10 Essential (primary) hypertension: Secondary | ICD-10-CM | POA: Diagnosis not present

## 2012-06-29 DIAGNOSIS — M79609 Pain in unspecified limb: Secondary | ICD-10-CM | POA: Diagnosis not present

## 2012-06-29 DIAGNOSIS — I83893 Varicose veins of bilateral lower extremities with other complications: Secondary | ICD-10-CM | POA: Diagnosis not present

## 2012-06-29 DIAGNOSIS — M7989 Other specified soft tissue disorders: Secondary | ICD-10-CM | POA: Diagnosis not present

## 2012-07-10 ENCOUNTER — Encounter: Payer: Self-pay | Admitting: Vascular Surgery

## 2012-07-11 ENCOUNTER — Ambulatory Visit (INDEPENDENT_AMBULATORY_CARE_PROVIDER_SITE_OTHER): Payer: Medicare Other | Admitting: Vascular Surgery

## 2012-07-11 ENCOUNTER — Encounter (INDEPENDENT_AMBULATORY_CARE_PROVIDER_SITE_OTHER): Payer: Medicare Other | Admitting: *Deleted

## 2012-07-11 ENCOUNTER — Encounter: Payer: Self-pay | Admitting: Vascular Surgery

## 2012-07-11 VITALS — BP 152/68 | HR 56 | Ht 69.5 in | Wt 199.5 lb

## 2012-07-11 DIAGNOSIS — K649 Unspecified hemorrhoids: Secondary | ICD-10-CM

## 2012-07-11 DIAGNOSIS — I83893 Varicose veins of bilateral lower extremities with other complications: Secondary | ICD-10-CM

## 2012-07-11 HISTORY — PX: OTHER SURGICAL HISTORY: SHX169

## 2012-07-11 NOTE — Progress Notes (Signed)
[facility]ist [facility]  Patient name: Zachary Mcmahon MRN: [id number] DOB: 06/17/1932 Sex: male  REASON FOR CONSULT: varicose veins of the left lower extremity.  HPI: Zachary Mcmahon is a 78 y.o. 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 Many years ago he had sclerotherapy in [place]. He is unaware of any surgery on his lower extremities. He experiences significant aching pain and burning pain in the left leg associated with his varicose veins. He has also noted some left leg swelling. He also experiences throbbing pain and fatigue in his left lower extremity. His symptoms are aggravated by standing and relieved with elevation.  Past Medical History  Diagnosis Date  . Hypertension   . Diabetes mellitus   . High potassium   . Varicose veins   . Myocardial infarction   . DVT (deep venous thrombosis)     Family History  Problem Relation Age of Onset  . Deep vein thrombosis Mother   . Hypertension Mother   . Hypertension Sister   . Diabetes Brother   . Hyperlipidemia Brother   . Hypertension Brother   . Heart attack Brother     SOCIAL HISTORY: History  Substance Use Topics  . Smoking status: Never Smoker   . Smokeless tobacco: Never Used  . Alcohol Use: No    No Known Allergies  Current Outpatient Prescriptions  Medication Sig Dispense Refill  . acetaminophen (TYLENOL) 325 MG tablet Take 2 tablets (650 mg total) by mouth every 6 (six) hours as needed (or Fever >/= 101).      Marland Kitchen aspirin EC 81 MG tablet Take 81 mg by mouth every morning.      . enoxaparin (LOVENOX) 40 MG/0.4ML injection Inject 0.4 mLs (40 mg total) into the skin daily.  0 Syringe    . furosemide (LASIX) 20 MG tablet Take 20 mg by mouth every morning.      . insulin aspart (NOVOLOG) 100 UNIT/ML injection Inject 0-5 Units into the skin at bedtime.  1 vial    . insulin aspart (NOVOLOG) 100 UNIT/ML injection Inject 0-9 Units into the skin 3 (three)  times daily with meals.  1 vial    . insulin detemir (LEVEMIR) 100 UNIT/ML injection Inject 54 Units into the skin at bedtime.      Marland Kitchen labetalol (NORMODYNE) 300 MG tablet Take 300 mg by mouth 2 (two) times daily.      Marland Kitchen NIFEdipine (PROCARDIA XL) 60 MG 24 hr tablet Take 1 tablet (60 mg total) by mouth daily.      Marland Kitchen senna (SENOKOT) 8.6 MG TABS Take 1 tablet (8.6 mg total) by mouth 2 (two) times daily.  120 each    . Vitamin D, Ergocalciferol, (DRISDOL) 50000 UNITS CAPS Take 50,000 Units by mouth every 7 (seven) days. On Tuesdays        REVIEW OF SYSTEMS: Valu.Nieves ] denotes positive finding; [  ] denotes negative finding  CARDIOVASCULAR:  [ ]  chest pain   [ ]  chest pressure   [ ]  palpitations   [ ]  orthopnea   [ ]  dyspnea on exertion   [ ]  claudication   [ ]  rest pain   [ ]  DVT   [ ]  phlebitis PULMONARY:   [ ]  productive cough   [ ]  asthma   [ ]  wheezing NEUROLOGIC:   [ ]  weakness  [ ]  paresthesias  [ ]  aphasia  [ ]  amaurosis  [ ]  dizziness  HEMATOLOGIC:   [ ]  bleeding problems   [ ]  clotting disorders MUSCULOSKELETAL:  [ ]  joint pain   [ ]  joint swelling Valu.Nieves ] leg swelling GASTROINTESTINAL: [ ]   blood in stool  [ ]   hematemesis GENITOURINARY:  [ ]   dysuria  [ ]   hematuria PSYCHIATRIC:  [ ]  history of major depression INTEGUMENTARY:  [ ]  rashes  [ ]  ulcers CONSTITUTIONAL:  [ ]  fever   [ ]  chills  PHYSICAL EXAM: Filed Vitals:   07/11/12 1153  BP: 152/68  Pulse: 56  Height: 5' 9.5" (1.765 m)  Weight: 199 lb 8 oz (90.493 kg)  SpO2: 100%   Body mass index is 29.04 kg/(m^2). GENERAL: The patient is a well-nourished male, in no acute distress. The vital signs are documented above. CARDIOVASCULAR: There is a regular rate and rhythm. I do not detect carotid bruits. He has palpable femoral pulses. He has palpable pedal pulses on the right. I cannot palpate pedal pulses on the left. He does have monophasic Doppler signals in the left foot by my exam. PULMONARY: There is good air exchange bilaterally  without wheezing or rales. ABDOMEN: Soft and non-tender with normal pitched bowel sounds.  MUSCULOSKELETAL: There are no major deformities or cyanosis. NEUROLOGIC: No focal weakness or paresthesias are detected. SKIN: he has significant varicosities of the medial left leg. There is evidence of induration consistent with phlebitis in the distal left leg. PSYCHIATRIC: The patient has a normal affect.  DATA:  I have independently interpreted his venous duplex scan which shows a partially thrombosed greater saphenous vein consistent with recanalization of the saphenous vein. He apparently had some phlebitis in this in the past. He has incompetence of the short saphenous vein of the left leg also.  MEDICAL ISSUES: This patient has evidence of phlebitis of the left medial leg. I've discussed the importance of intermittent leg elevation the proper positioning for this. Also encouraged him to use a warm compress. I also have told him that he should begin taking ibuprofen for pain for now. Once his symptoms have improved I have written him a prescription for knee-high compression stockings with a 20-30 mmHg pressure gradient. If his symptoms worsen I be happy to see him back at any time. Once his phlebitis resolves, if he had recurrent problems with phlebitis he could potentially be considered for laser ablation of the short saphenous vein on the left. However it appears that more likely these varicosities are fed from his saphenous system which is partially thrombosed. This appears to be chronic.   [place] [facility] Beeper: [phone]

## 2012-07-12 ENCOUNTER — Other Ambulatory Visit: Payer: Self-pay | Admitting: *Deleted

## 2012-07-12 DIAGNOSIS — I83893 Varicose veins of bilateral lower extremities with other complications: Secondary | ICD-10-CM

## 2012-08-08 DIAGNOSIS — C44319 Basal cell carcinoma of skin of other parts of face: Secondary | ICD-10-CM | POA: Diagnosis not present

## 2012-08-08 DIAGNOSIS — L570 Actinic keratosis: Secondary | ICD-10-CM | POA: Diagnosis not present

## 2012-08-20 DIAGNOSIS — E119 Type 2 diabetes mellitus without complications: Secondary | ICD-10-CM | POA: Diagnosis not present

## 2012-08-20 DIAGNOSIS — D508 Other iron deficiency anemias: Secondary | ICD-10-CM | POA: Diagnosis not present

## 2012-08-20 DIAGNOSIS — E785 Hyperlipidemia, unspecified: Secondary | ICD-10-CM | POA: Diagnosis not present

## 2012-08-20 DIAGNOSIS — E782 Mixed hyperlipidemia: Secondary | ICD-10-CM | POA: Diagnosis not present

## 2012-08-20 DIAGNOSIS — I10 Essential (primary) hypertension: Secondary | ICD-10-CM | POA: Diagnosis not present

## 2012-08-30 DIAGNOSIS — M25579 Pain in unspecified ankle and joints of unspecified foot: Secondary | ICD-10-CM | POA: Diagnosis not present

## 2012-08-30 DIAGNOSIS — E119 Type 2 diabetes mellitus without complications: Secondary | ICD-10-CM | POA: Diagnosis not present

## 2012-09-20 DIAGNOSIS — C44319 Basal cell carcinoma of skin of other parts of face: Secondary | ICD-10-CM | POA: Diagnosis not present

## 2012-09-24 DIAGNOSIS — C44319 Basal cell carcinoma of skin of other parts of face: Secondary | ICD-10-CM | POA: Diagnosis not present

## 2012-09-26 DIAGNOSIS — C44319 Basal cell carcinoma of skin of other parts of face: Secondary | ICD-10-CM | POA: Diagnosis not present

## 2012-09-28 DIAGNOSIS — C44319 Basal cell carcinoma of skin of other parts of face: Secondary | ICD-10-CM | POA: Diagnosis not present

## 2012-10-01 DIAGNOSIS — C44319 Basal cell carcinoma of skin of other parts of face: Secondary | ICD-10-CM | POA: Diagnosis not present

## 2012-10-03 DIAGNOSIS — C44319 Basal cell carcinoma of skin of other parts of face: Secondary | ICD-10-CM | POA: Diagnosis not present

## 2012-10-08 DIAGNOSIS — C44319 Basal cell carcinoma of skin of other parts of face: Secondary | ICD-10-CM | POA: Diagnosis not present

## 2012-10-10 DIAGNOSIS — C44319 Basal cell carcinoma of skin of other parts of face: Secondary | ICD-10-CM | POA: Diagnosis not present

## 2012-10-12 DIAGNOSIS — E119 Type 2 diabetes mellitus without complications: Secondary | ICD-10-CM | POA: Diagnosis not present

## 2012-10-12 DIAGNOSIS — I10 Essential (primary) hypertension: Secondary | ICD-10-CM | POA: Diagnosis not present

## 2012-10-12 DIAGNOSIS — C44319 Basal cell carcinoma of skin of other parts of face: Secondary | ICD-10-CM | POA: Diagnosis not present

## 2012-10-12 DIAGNOSIS — I8390 Asymptomatic varicose veins of unspecified lower extremity: Secondary | ICD-10-CM | POA: Diagnosis not present

## 2012-10-12 DIAGNOSIS — M25569 Pain in unspecified knee: Secondary | ICD-10-CM | POA: Diagnosis not present

## 2012-10-15 DIAGNOSIS — C44319 Basal cell carcinoma of skin of other parts of face: Secondary | ICD-10-CM | POA: Diagnosis not present

## 2012-10-17 DIAGNOSIS — C44319 Basal cell carcinoma of skin of other parts of face: Secondary | ICD-10-CM | POA: Diagnosis not present

## 2012-10-19 DIAGNOSIS — C44319 Basal cell carcinoma of skin of other parts of face: Secondary | ICD-10-CM | POA: Diagnosis not present

## 2012-10-23 ENCOUNTER — Encounter: Payer: Self-pay | Admitting: Vascular Surgery

## 2012-10-23 ENCOUNTER — Ambulatory Visit (INDEPENDENT_AMBULATORY_CARE_PROVIDER_SITE_OTHER): Payer: Medicare Other | Admitting: Vascular Surgery

## 2012-10-23 VITALS — BP 158/65 | HR 60 | Resp 20 | Ht 69.5 in | Wt 206.0 lb

## 2012-10-23 DIAGNOSIS — I83893 Varicose veins of bilateral lower extremities with other complications: Secondary | ICD-10-CM | POA: Insufficient documentation

## 2012-10-23 NOTE — Progress Notes (Signed)
The patient has today for continued discussion regarding his lower extremity pain. He does have pain over the medial aspect of his left calf. He does have extensive changes of thrombophlebitis in this area. He also reports some numbness into his foot. He is not able to wear compression garments reports they're quite painful for him. I did discuss the use of Ace wraps for compression and he feels that this may be more possible to comply with.  Past Medical History  Diagnosis Date  . Hypertension   . Diabetes mellitus   . High potassium   . Varicose veins   . Myocardial infarction   . DVT (deep venous thrombosis)     History  Substance Use Topics  . Smoking status: Never Smoker   . Smokeless tobacco: Never Used  . Alcohol Use: No    Family History  Problem Relation Age of Onset  . Deep vein thrombosis Mother   . Hypertension Mother   . Hypertension Sister   . Diabetes Brother   . Hyperlipidemia Brother   . Hypertension Brother   . Heart attack Brother     No Known Allergies  Current outpatient prescriptions:acetaminophen (TYLENOL) 325 MG tablet, Take 2 tablets (650 mg total) by mouth every 6 (six) hours as needed (or Fever >/= 101)., Disp: , Rfl: ;  aspirin EC 81 MG tablet, Take 81 mg by mouth every morning., Disp: , Rfl: ;  enoxaparin (LOVENOX) 40 MG/0.4ML injection, Inject 0.4 mLs (40 mg total) into the skin daily., Disp: 0 Syringe, Rfl:  furosemide (LASIX) 20 MG tablet, Take 20 mg by mouth every morning., Disp: , Rfl: ;  insulin aspart (NOVOLOG) 100 UNIT/ML injection, Inject 0-5 Units into the skin at bedtime., Disp: 1 vial, Rfl: ;  insulin aspart (NOVOLOG) 100 UNIT/ML injection, Inject 0-9 Units into the skin 3 (three) times daily with meals., Disp: 1 vial, Rfl: ;  insulin detemir (LEVEMIR) 100 UNIT/ML injection, Inject 54 Units into the skin at bedtime., Disp: , Rfl:  labetalol (NORMODYNE) 300 MG tablet, Take 300 mg by mouth 2 (two) times daily., Disp: , Rfl: ;  NIFEdipine  (PROCARDIA XL) 60 MG 24 hr tablet, Take 1 tablet (60 mg total) by mouth daily., Disp: , Rfl: ;  senna (SENOKOT) 8.6 MG TABS, Take 1 tablet (8.6 mg total) by mouth 2 (two) times daily., Disp: 120 each, Rfl: ;  Vitamin D, Ergocalciferol, (DRISDOL) 50000 UNITS CAPS, Take 50,000 Units by mouth every 7 (seven) days. On Tuesdays, Disp: , Rfl:   BP 158/65  Pulse 60  Resp 20  Ht 5' 9.5" (1.765 m)  Wt 206 lb (93.441 kg)  BMI 29.99 kg/m2  Body mass index is 29.99 kg/(m^2).       Physical exam well-developed gentleman appearing stated age in no acute distress He does have palpable popliteal and 1+ left dorsalis pedis and 2+ right posterior tibial pulse. He does have changes of venous hypertension on the medial aspect of his left leg in particular and marked varices over this area many of which are partially thrombosed  I did reimage this area with the SonoSite and also his great saphenous vein. This is dilated with partially occlusive thrombus throughout. Small saphenous vein is patent without thrombus and a small caliber.  A hand-held Doppler interrogation of his feet showed good biphasic pedal signals bilaterally  Impression and plan: No evidence of arterial insufficiency in bilateral lower extremities. He does have a venous hypertension related to saphenous incompetence and some deep venous  incompetence as well. I explained there is no treatment with his thrombosed surface veins. I suspect that he may have lifelong issue regarding this. Unfortunately this the treatment options would be elevation and compression. He is comfortable this discussion will see [place] again on an as-needed basis

## 2012-11-15 DIAGNOSIS — I2510 Atherosclerotic heart disease of native coronary artery without angina pectoris: Secondary | ICD-10-CM | POA: Diagnosis not present

## 2012-11-15 DIAGNOSIS — E782 Mixed hyperlipidemia: Secondary | ICD-10-CM | POA: Diagnosis not present

## 2012-11-15 DIAGNOSIS — E119 Type 2 diabetes mellitus without complications: Secondary | ICD-10-CM | POA: Diagnosis not present

## 2012-11-15 DIAGNOSIS — I10 Essential (primary) hypertension: Secondary | ICD-10-CM | POA: Diagnosis not present

## 2012-11-22 DIAGNOSIS — C44319 Basal cell carcinoma of skin of other parts of face: Secondary | ICD-10-CM | POA: Diagnosis not present

## 2012-11-22 DIAGNOSIS — C4491 Basal cell carcinoma of skin, unspecified: Secondary | ICD-10-CM | POA: Diagnosis not present

## 2012-12-12 DIAGNOSIS — E1139 Type 2 diabetes mellitus with other diabetic ophthalmic complication: Secondary | ICD-10-CM | POA: Diagnosis not present

## 2012-12-12 DIAGNOSIS — E11329 Type 2 diabetes mellitus with mild nonproliferative diabetic retinopathy without macular edema: Secondary | ICD-10-CM | POA: Diagnosis not present

## 2012-12-27 DIAGNOSIS — C44319 Basal cell carcinoma of skin of other parts of face: Secondary | ICD-10-CM | POA: Diagnosis not present

## 2013-01-01 DIAGNOSIS — E785 Hyperlipidemia, unspecified: Secondary | ICD-10-CM | POA: Diagnosis not present

## 2013-01-01 DIAGNOSIS — E782 Mixed hyperlipidemia: Secondary | ICD-10-CM | POA: Diagnosis not present

## 2013-01-01 DIAGNOSIS — I10 Essential (primary) hypertension: Secondary | ICD-10-CM | POA: Diagnosis not present

## 2013-01-01 DIAGNOSIS — I259 Chronic ischemic heart disease, unspecified: Secondary | ICD-10-CM | POA: Diagnosis not present

## 2013-01-01 DIAGNOSIS — I2510 Atherosclerotic heart disease of native coronary artery without angina pectoris: Secondary | ICD-10-CM | POA: Diagnosis not present

## 2013-01-01 DIAGNOSIS — E119 Type 2 diabetes mellitus without complications: Secondary | ICD-10-CM | POA: Diagnosis not present

## 2013-04-09 DIAGNOSIS — L570 Actinic keratosis: Secondary | ICD-10-CM | POA: Diagnosis not present

## 2013-04-09 DIAGNOSIS — C44319 Basal cell carcinoma of skin of other parts of face: Secondary | ICD-10-CM | POA: Diagnosis not present

## 2013-04-29 DIAGNOSIS — E119 Type 2 diabetes mellitus without complications: Secondary | ICD-10-CM | POA: Diagnosis not present

## 2013-04-29 DIAGNOSIS — L600 Ingrowing nail: Secondary | ICD-10-CM | POA: Diagnosis not present

## 2013-04-29 DIAGNOSIS — E1149 Type 2 diabetes mellitus with other diabetic neurological complication: Secondary | ICD-10-CM | POA: Diagnosis not present

## 2013-04-29 DIAGNOSIS — M79609 Pain in unspecified limb: Secondary | ICD-10-CM | POA: Diagnosis not present

## 2013-05-07 DIAGNOSIS — E1142 Type 2 diabetes mellitus with diabetic polyneuropathy: Secondary | ICD-10-CM | POA: Diagnosis not present

## 2013-05-07 DIAGNOSIS — E119 Type 2 diabetes mellitus without complications: Secondary | ICD-10-CM | POA: Diagnosis not present

## 2013-05-07 DIAGNOSIS — I10 Essential (primary) hypertension: Secondary | ICD-10-CM | POA: Diagnosis not present

## 2013-05-07 DIAGNOSIS — E785 Hyperlipidemia, unspecified: Secondary | ICD-10-CM | POA: Diagnosis not present

## 2013-05-07 DIAGNOSIS — E782 Mixed hyperlipidemia: Secondary | ICD-10-CM | POA: Diagnosis not present

## 2013-05-07 DIAGNOSIS — I259 Chronic ischemic heart disease, unspecified: Secondary | ICD-10-CM | POA: Diagnosis not present

## 2013-05-15 DIAGNOSIS — E1139 Type 2 diabetes mellitus with other diabetic ophthalmic complication: Secondary | ICD-10-CM | POA: Diagnosis not present

## 2013-05-30 DIAGNOSIS — C44319 Basal cell carcinoma of skin of other parts of face: Secondary | ICD-10-CM | POA: Diagnosis not present

## 2013-05-30 DIAGNOSIS — C44300 Unspecified malignant neoplasm of skin of unspecified part of face: Secondary | ICD-10-CM | POA: Diagnosis not present

## 2013-06-11 DIAGNOSIS — C44319 Basal cell carcinoma of skin of other parts of face: Secondary | ICD-10-CM | POA: Diagnosis not present

## 2013-06-19 DIAGNOSIS — Z23 Encounter for immunization: Secondary | ICD-10-CM | POA: Diagnosis not present

## 2013-07-15 DIAGNOSIS — E1149 Type 2 diabetes mellitus with other diabetic neurological complication: Secondary | ICD-10-CM | POA: Diagnosis not present

## 2013-07-15 DIAGNOSIS — L851 Acquired keratosis [keratoderma] palmaris et plantaris: Secondary | ICD-10-CM | POA: Diagnosis not present

## 2013-07-15 DIAGNOSIS — E119 Type 2 diabetes mellitus without complications: Secondary | ICD-10-CM | POA: Diagnosis not present

## 2013-08-01 DIAGNOSIS — C44319 Basal cell carcinoma of skin of other parts of face: Secondary | ICD-10-CM | POA: Diagnosis not present

## 2013-08-07 DIAGNOSIS — L851 Acquired keratosis [keratoderma] palmaris et plantaris: Secondary | ICD-10-CM | POA: Diagnosis not present

## 2013-08-07 DIAGNOSIS — E119 Type 2 diabetes mellitus without complications: Secondary | ICD-10-CM | POA: Diagnosis not present

## 2013-08-07 DIAGNOSIS — E1149 Type 2 diabetes mellitus with other diabetic neurological complication: Secondary | ICD-10-CM | POA: Diagnosis not present

## 2013-09-02 ENCOUNTER — Other Ambulatory Visit: Payer: Self-pay | Admitting: *Deleted

## 2013-09-02 MED ORDER — ATORVASTATIN CALCIUM 20 MG PO TABS: 20.0000 mg | ORAL_TABLET | Freq: Every day | ORAL | Status: DC

## 2013-09-02 NOTE — Telephone Encounter (Signed)
Rx was sent to pharmacy electronically. 

## 2013-09-23 DIAGNOSIS — E119 Type 2 diabetes mellitus without complications: Secondary | ICD-10-CM | POA: Diagnosis not present

## 2013-09-23 DIAGNOSIS — E1149 Type 2 diabetes mellitus with other diabetic neurological complication: Secondary | ICD-10-CM | POA: Diagnosis not present

## 2013-09-23 DIAGNOSIS — L851 Acquired keratosis [keratoderma] palmaris et plantaris: Secondary | ICD-10-CM | POA: Diagnosis not present

## 2013-11-04 DIAGNOSIS — E785 Hyperlipidemia, unspecified: Secondary | ICD-10-CM | POA: Diagnosis not present

## 2013-11-04 DIAGNOSIS — I10 Essential (primary) hypertension: Secondary | ICD-10-CM | POA: Diagnosis not present

## 2013-11-04 DIAGNOSIS — E119 Type 2 diabetes mellitus without complications: Secondary | ICD-10-CM | POA: Diagnosis not present

## 2013-11-06 DIAGNOSIS — E782 Mixed hyperlipidemia: Secondary | ICD-10-CM | POA: Diagnosis not present

## 2013-11-06 DIAGNOSIS — E119 Type 2 diabetes mellitus without complications: Secondary | ICD-10-CM | POA: Diagnosis not present

## 2013-11-06 DIAGNOSIS — I10 Essential (primary) hypertension: Secondary | ICD-10-CM | POA: Diagnosis not present

## 2013-12-02 DIAGNOSIS — L851 Acquired keratosis [keratoderma] palmaris et plantaris: Secondary | ICD-10-CM | POA: Diagnosis not present

## 2013-12-02 DIAGNOSIS — E1149 Type 2 diabetes mellitus with other diabetic neurological complication: Secondary | ICD-10-CM | POA: Diagnosis not present

## 2013-12-02 DIAGNOSIS — E119 Type 2 diabetes mellitus without complications: Secondary | ICD-10-CM | POA: Diagnosis not present

## 2013-12-18 DIAGNOSIS — D485 Neoplasm of uncertain behavior of skin: Secondary | ICD-10-CM | POA: Diagnosis not present

## 2013-12-18 DIAGNOSIS — C44319 Basal cell carcinoma of skin of other parts of face: Secondary | ICD-10-CM | POA: Diagnosis not present

## 2013-12-18 DIAGNOSIS — L570 Actinic keratosis: Secondary | ICD-10-CM | POA: Diagnosis not present

## 2014-01-03 ENCOUNTER — Other Ambulatory Visit: Payer: Self-pay | Admitting: *Deleted

## 2014-01-03 MED ORDER — ATORVASTATIN CALCIUM 20 MG PO TABS: 20.0000 mg | ORAL_TABLET | Freq: Every day | ORAL | Status: DC

## 2014-01-03 NOTE — Telephone Encounter (Signed)
Atorvastatin 20mg  refilled w/no refills electronically.  Needs appt before next refill.

## 2014-02-03 ENCOUNTER — Other Ambulatory Visit: Payer: Self-pay | Admitting: Cardiovascular Disease

## 2014-02-04 DIAGNOSIS — I10 Essential (primary) hypertension: Secondary | ICD-10-CM | POA: Diagnosis not present

## 2014-02-04 DIAGNOSIS — E785 Hyperlipidemia, unspecified: Secondary | ICD-10-CM | POA: Diagnosis not present

## 2014-02-04 DIAGNOSIS — E119 Type 2 diabetes mellitus without complications: Secondary | ICD-10-CM | POA: Diagnosis not present

## 2014-02-04 NOTE — Telephone Encounter (Signed)
Rx was sent to pharmacy electronically. Patient's last office visit - 11/15/2012.

## 2014-02-06 DIAGNOSIS — C44319 Basal cell carcinoma of skin of other parts of face: Secondary | ICD-10-CM | POA: Diagnosis not present

## 2014-02-11 DIAGNOSIS — I10 Essential (primary) hypertension: Secondary | ICD-10-CM | POA: Diagnosis not present

## 2014-02-11 DIAGNOSIS — N183 Chronic kidney disease, stage 3 unspecified: Secondary | ICD-10-CM | POA: Diagnosis not present

## 2014-02-11 DIAGNOSIS — E119 Type 2 diabetes mellitus without complications: Secondary | ICD-10-CM | POA: Diagnosis not present

## 2014-02-11 DIAGNOSIS — E782 Mixed hyperlipidemia: Secondary | ICD-10-CM | POA: Diagnosis not present

## 2014-02-17 DIAGNOSIS — E1149 Type 2 diabetes mellitus with other diabetic neurological complication: Secondary | ICD-10-CM | POA: Diagnosis not present

## 2014-02-17 DIAGNOSIS — E119 Type 2 diabetes mellitus without complications: Secondary | ICD-10-CM | POA: Diagnosis not present

## 2014-02-17 DIAGNOSIS — L851 Acquired keratosis [keratoderma] palmaris et plantaris: Secondary | ICD-10-CM | POA: Diagnosis not present

## 2014-02-18 ENCOUNTER — Other Ambulatory Visit: Payer: Self-pay | Admitting: Cardiovascular Disease

## 2014-02-28 ENCOUNTER — Other Ambulatory Visit: Payer: Self-pay | Admitting: Cardiovascular Disease

## 2014-03-03 NOTE — Telephone Encounter (Signed)
Rx was sent to pharmacy electronically. Patient needs appmt. No number available in EPIC.

## 2014-03-27 ENCOUNTER — Other Ambulatory Visit: Payer: Self-pay | Admitting: Cardiovascular Disease

## 2014-04-15 ENCOUNTER — Ambulatory Visit: Payer: Medicare Other | Admitting: Cardiovascular Disease

## 2014-04-22 ENCOUNTER — Encounter: Payer: Self-pay | Admitting: *Deleted

## 2014-04-23 ENCOUNTER — Encounter: Payer: Self-pay | Admitting: Cardiovascular Disease

## 2014-04-23 ENCOUNTER — Ambulatory Visit (INDEPENDENT_AMBULATORY_CARE_PROVIDER_SITE_OTHER): Payer: Medicare Other | Admitting: Cardiovascular Disease

## 2014-04-23 VITALS — BP 160/88 | HR 59 | Ht 69.5 in | Wt 212.0 lb

## 2014-04-23 DIAGNOSIS — I10 Essential (primary) hypertension: Secondary | ICD-10-CM | POA: Diagnosis not present

## 2014-04-23 DIAGNOSIS — I2510 Atherosclerotic heart disease of native coronary artery without angina pectoris: Secondary | ICD-10-CM | POA: Insufficient documentation

## 2014-04-23 DIAGNOSIS — E785 Hyperlipidemia, unspecified: Secondary | ICD-10-CM | POA: Diagnosis not present

## 2014-04-23 NOTE — Progress Notes (Signed)
04/23/2014 Zachary Mcmahon   1932/05/07  [id number]  Primary Physician [name], MD Primary Cardiologist: Lorretta Harp MD Renae Gloss   HPI:  Mr. Fowle is a 80 year old mildly overweight Zachary Mcmahon Caucasian male father of 2 children who is retired from the [employer] where he worked for 79 years. I last saw him 2 years ago. He fractured his right leg several years ago and got rehabilitation in the [facility]. This is factors for coronary disease include diabetes and hypertension. He's had an echo that revealed an EF of 45% with severe hypokinesia of the mid to distal inferolateral wall. He said a Myoview that showed inferior lateral scar. He does get occasional GERD but otherwise is asymptomatic.   Current Outpatient Prescriptions  Medication Sig Dispense Refill  . aspirin EC 81 MG tablet Take 81 mg by mouth every morning.      Marland Kitchen atorvastatin (LIPITOR) 20 MG tablet TAKE 1 TABLET BY MOUTH DAILY AT 6:00 P.M. ( MUST SCHEDULE AN APPOINTMENT FOR FUTURE REFILLS )  30 tablet  0  . cloNIDine (CATAPRES) 0.1 MG tablet Take 0.1 mg by mouth 2 (two) times daily.       . furosemide (LASIX) 20 MG tablet Take 20 mg by mouth every morning.      . gabapentin (NEURONTIN) 100 MG capsule Take 100 mg by mouth 3 (three) times daily.       . insulin detemir (LEVEMIR) 100 UNIT/ML injection Inject 34 Units into the skin at bedtime.       Marland Kitchen labetalol (NORMODYNE) 300 MG tablet Take 300 mg by mouth 2 (two) times daily.      Marland Kitchen omeprazole (PRILOSEC) 20 MG capsule Take 20 mg by mouth daily.       . potassium chloride SA (K-DUR,KLOR-CON) 20 MEQ tablet Take 20 mEq by mouth daily.      . traMADol (ULTRAM) 50 MG tablet Take 50 mg by mouth every 6 (six) hours as needed.       . insulin aspart (NOVOLOG) 100 UNIT/ML injection Inject 0-5 Units into the skin at bedtime.  1 vial    . insulin aspart (NOVOLOG) 100 UNIT/ML injection Inject 0-9 Units into the skin 3 (three) times daily with meals.  1 vial     No  current facility-administered medications for this visit.    Allergies  Allergen Reactions  . Bactrim [Sulfamethoxazole-Trimethoprim]     History   Social History  . Marital Status: Married    Spouse Name: N/A    Number of Children: N/A  . Years of Education: N/A   Occupational History  . Not on file.   Social History Main Topics  . Smoking status: Never Smoker   . Smokeless tobacco: Never Used  . Alcohol Use: No  . Drug Use: No  . Sexual Activity: Not on file   Other Topics Concern  . Not on file   Social History Narrative  . No narrative on file     Review of Systems: General: negative for chills, fever, night sweats or weight changes.  Cardiovascular: negative for chest pain, dyspnea on exertion, edema, orthopnea, palpitations, paroxysmal nocturnal dyspnea or shortness of breath Dermatological: negative for rash Respiratory: negative for cough or wheezing Urologic: negative for hematuria Abdominal: negative for nausea, vomiting, diarrhea, bright red blood per rectum, melena, or hematemesis Neurologic: negative for visual changes, syncope, or dizziness All other systems reviewed and are otherwise negative except as noted above.    Blood pressure  160/88, pulse 59, height 5' 9.5" (1.765 m), weight 212 lb (96.163 kg).  General appearance: alert and no distress Neck: no adenopathy, no carotid bruit, no JVD, supple, symmetrical, trachea midline and thyroid not enlarged, symmetric, no tenderness/mass/nodules Lungs: clear to auscultation bilaterally Heart: regular rate and rhythm, S1, S2 normal, no murmur, click, rub or gallop Extremities: extremities normal, atraumatic, no cyanosis or edema  EKG sinus bradycardia at 59 with nonspecific ST and T wave changes  ASSESSMENT AND PLAN:   Hyperlipidemia On statin therapy, followed by his PCP. He does complain of pain in his legs which may be a statin related. We will give him a statin holiday  HTN (hypertension),  malignant Mildly elevated today on appropriate medications  Coronary artery disease History of mild LV dysfunction with inferior wall motion or mobility and prior Myoview stress test suggesting scar in the lateral and inferior wall. The patient has never had a heart catheterization. He denies chest pain or shortness of breath.      Lorretta Harp MD FACP,FACC,FAHA, [facility] 04/23/2014 11:34 AM

## 2014-04-23 NOTE — Patient Instructions (Signed)
Your physician wants you to follow-up in: 1 year with Dr Gwenlyn Found. You will receive a reminder letter in the mail two months in advance. If you don't receive a letter, please call our office to schedule the follow-up appointment.  Dr Gwenlyn Found wants you to hold your cholesterol medication (atrovastatin) for 2 months.  After two months restart the medication.  If the discomfort returns, call our office for additional instructions.  If the discomfort does not return, continue taking your atorvastatin.

## 2014-04-23 NOTE — Assessment & Plan Note (Addendum)
Mildly elevated today on appropriate medications

## 2014-04-23 NOTE — Assessment & Plan Note (Signed)
History of mild LV dysfunction with inferior wall motion or mobility and prior Myoview stress test suggesting scar in the lateral and inferior wall. The patient has never had a heart catheterization. He denies chest pain or shortness of breath.

## 2014-04-23 NOTE — Assessment & Plan Note (Signed)
On statin therapy, followed by his PCP. He does complain of pain in his legs which may be a statin related. We will give him a statin holiday

## 2014-04-28 ENCOUNTER — Other Ambulatory Visit: Payer: Self-pay | Admitting: Cardiovascular Disease

## 2014-05-02 NOTE — Telephone Encounter (Signed)
Rx refill denied to patient pharmacy, patient on 2 month statin holiday as of 04/23/14

## 2014-05-07 DIAGNOSIS — E1149 Type 2 diabetes mellitus with other diabetic neurological complication: Secondary | ICD-10-CM | POA: Diagnosis not present

## 2014-05-07 DIAGNOSIS — E119 Type 2 diabetes mellitus without complications: Secondary | ICD-10-CM | POA: Diagnosis not present

## 2014-05-07 DIAGNOSIS — L851 Acquired keratosis [keratoderma] palmaris et plantaris: Secondary | ICD-10-CM | POA: Diagnosis not present

## 2014-05-21 DIAGNOSIS — I10 Essential (primary) hypertension: Secondary | ICD-10-CM | POA: Diagnosis not present

## 2014-05-21 DIAGNOSIS — E782 Mixed hyperlipidemia: Secondary | ICD-10-CM | POA: Diagnosis not present

## 2014-05-21 DIAGNOSIS — E119 Type 2 diabetes mellitus without complications: Secondary | ICD-10-CM | POA: Diagnosis not present

## 2014-05-23 DIAGNOSIS — I10 Essential (primary) hypertension: Secondary | ICD-10-CM | POA: Diagnosis not present

## 2014-05-23 DIAGNOSIS — E1122 Type 2 diabetes mellitus with diabetic chronic kidney disease: Secondary | ICD-10-CM | POA: Diagnosis not present

## 2014-05-23 DIAGNOSIS — Z794 Long term (current) use of insulin: Secondary | ICD-10-CM | POA: Diagnosis not present

## 2014-05-23 DIAGNOSIS — N183 Chronic kidney disease, stage 3 (moderate): Secondary | ICD-10-CM | POA: Diagnosis not present

## 2014-07-15 DIAGNOSIS — H6691 Otitis media, unspecified, right ear: Secondary | ICD-10-CM | POA: Diagnosis not present

## 2014-07-31 DIAGNOSIS — E1140 Type 2 diabetes mellitus with diabetic neuropathy, unspecified: Secondary | ICD-10-CM | POA: Diagnosis not present

## 2014-07-31 DIAGNOSIS — L609 Nail disorder, unspecified: Secondary | ICD-10-CM | POA: Diagnosis not present

## 2014-07-31 DIAGNOSIS — L110 Acquired keratosis follicularis: Secondary | ICD-10-CM | POA: Diagnosis not present

## 2014-08-26 DIAGNOSIS — I10 Essential (primary) hypertension: Secondary | ICD-10-CM | POA: Diagnosis not present

## 2014-08-26 DIAGNOSIS — E785 Hyperlipidemia, unspecified: Secondary | ICD-10-CM | POA: Diagnosis not present

## 2014-08-26 DIAGNOSIS — E119 Type 2 diabetes mellitus without complications: Secondary | ICD-10-CM | POA: Diagnosis not present

## 2014-08-28 DIAGNOSIS — E1165 Type 2 diabetes mellitus with hyperglycemia: Secondary | ICD-10-CM | POA: Diagnosis not present

## 2014-08-28 DIAGNOSIS — N184 Chronic kidney disease, stage 4 (severe): Secondary | ICD-10-CM | POA: Diagnosis not present

## 2014-10-08 DIAGNOSIS — B351 Tinea unguium: Secondary | ICD-10-CM | POA: Diagnosis not present

## 2014-10-08 DIAGNOSIS — L110 Acquired keratosis follicularis: Secondary | ICD-10-CM | POA: Diagnosis not present

## 2014-10-08 DIAGNOSIS — E1151 Type 2 diabetes mellitus with diabetic peripheral angiopathy without gangrene: Secondary | ICD-10-CM | POA: Diagnosis not present

## 2014-10-13 DIAGNOSIS — I10 Essential (primary) hypertension: Secondary | ICD-10-CM | POA: Diagnosis not present

## 2014-10-13 DIAGNOSIS — Z6828 Body mass index (BMI) 28.0-28.9, adult: Secondary | ICD-10-CM | POA: Diagnosis not present

## 2014-10-21 DIAGNOSIS — C44310 Basal cell carcinoma of skin of unspecified parts of face: Secondary | ICD-10-CM | POA: Diagnosis not present

## 2014-10-21 DIAGNOSIS — I872 Venous insufficiency (chronic) (peripheral): Secondary | ICD-10-CM | POA: Diagnosis not present

## 2014-10-21 DIAGNOSIS — C44311 Basal cell carcinoma of skin of nose: Secondary | ICD-10-CM | POA: Diagnosis not present

## 2014-10-27 DIAGNOSIS — Z6828 Body mass index (BMI) 28.0-28.9, adult: Secondary | ICD-10-CM | POA: Diagnosis not present

## 2014-10-27 DIAGNOSIS — L03818 Cellulitis of other sites: Secondary | ICD-10-CM | POA: Diagnosis not present

## 2014-10-27 DIAGNOSIS — I10 Essential (primary) hypertension: Secondary | ICD-10-CM | POA: Diagnosis not present

## 2014-11-13 DIAGNOSIS — I83819 Varicose veins of unspecified lower extremities with pain: Secondary | ICD-10-CM | POA: Diagnosis not present

## 2014-11-13 DIAGNOSIS — E1140 Type 2 diabetes mellitus with diabetic neuropathy, unspecified: Secondary | ICD-10-CM | POA: Diagnosis not present

## 2014-11-13 DIAGNOSIS — M159 Polyosteoarthritis, unspecified: Secondary | ICD-10-CM | POA: Diagnosis not present

## 2014-11-13 DIAGNOSIS — E11622 Type 2 diabetes mellitus with other skin ulcer: Secondary | ICD-10-CM | POA: Diagnosis not present

## 2014-12-02 DIAGNOSIS — D485 Neoplasm of uncertain behavior of skin: Secondary | ICD-10-CM | POA: Diagnosis not present

## 2014-12-02 DIAGNOSIS — C44311 Basal cell carcinoma of skin of nose: Secondary | ICD-10-CM | POA: Diagnosis not present

## 2014-12-10 DIAGNOSIS — E782 Mixed hyperlipidemia: Secondary | ICD-10-CM | POA: Diagnosis not present

## 2014-12-10 DIAGNOSIS — E1165 Type 2 diabetes mellitus with hyperglycemia: Secondary | ICD-10-CM | POA: Diagnosis not present

## 2014-12-10 DIAGNOSIS — I10 Essential (primary) hypertension: Secondary | ICD-10-CM | POA: Diagnosis not present

## 2014-12-12 DIAGNOSIS — R809 Proteinuria, unspecified: Secondary | ICD-10-CM | POA: Diagnosis not present

## 2014-12-12 DIAGNOSIS — I10 Essential (primary) hypertension: Secondary | ICD-10-CM | POA: Diagnosis not present

## 2014-12-12 DIAGNOSIS — G629 Polyneuropathy, unspecified: Secondary | ICD-10-CM | POA: Diagnosis not present

## 2014-12-12 DIAGNOSIS — R600 Localized edema: Secondary | ICD-10-CM | POA: Diagnosis not present

## 2015-01-07 DIAGNOSIS — M79672 Pain in left foot: Secondary | ICD-10-CM | POA: Diagnosis not present

## 2015-01-07 DIAGNOSIS — B351 Tinea unguium: Secondary | ICD-10-CM | POA: Diagnosis not present

## 2015-01-07 DIAGNOSIS — L110 Acquired keratosis follicularis: Secondary | ICD-10-CM | POA: Diagnosis not present

## 2015-01-07 DIAGNOSIS — E1151 Type 2 diabetes mellitus with diabetic peripheral angiopathy without gangrene: Secondary | ICD-10-CM | POA: Diagnosis not present

## 2015-01-07 DIAGNOSIS — L03119 Cellulitis of unspecified part of limb: Secondary | ICD-10-CM | POA: Diagnosis not present

## 2015-01-13 DIAGNOSIS — C44311 Basal cell carcinoma of skin of nose: Secondary | ICD-10-CM | POA: Diagnosis not present

## 2015-01-23 DIAGNOSIS — R809 Proteinuria, unspecified: Secondary | ICD-10-CM | POA: Diagnosis not present

## 2015-01-23 DIAGNOSIS — D509 Iron deficiency anemia, unspecified: Secondary | ICD-10-CM | POA: Diagnosis not present

## 2015-01-23 DIAGNOSIS — N183 Chronic kidney disease, stage 3 (moderate): Secondary | ICD-10-CM | POA: Diagnosis not present

## 2015-01-23 DIAGNOSIS — E559 Vitamin D deficiency, unspecified: Secondary | ICD-10-CM | POA: Diagnosis not present

## 2015-01-23 DIAGNOSIS — I129 Hypertensive chronic kidney disease with stage 1 through stage 4 chronic kidney disease, or unspecified chronic kidney disease: Secondary | ICD-10-CM | POA: Diagnosis not present

## 2015-01-23 DIAGNOSIS — Z79899 Other long term (current) drug therapy: Secondary | ICD-10-CM | POA: Diagnosis not present

## 2015-01-25 DIAGNOSIS — E161 Other hypoglycemia: Secondary | ICD-10-CM | POA: Diagnosis not present

## 2015-01-31 DIAGNOSIS — R2689 Other abnormalities of gait and mobility: Secondary | ICD-10-CM | POA: Diagnosis not present

## 2015-01-31 DIAGNOSIS — R600 Localized edema: Secondary | ICD-10-CM | POA: Diagnosis not present

## 2015-01-31 DIAGNOSIS — M1990 Unspecified osteoarthritis, unspecified site: Secondary | ICD-10-CM | POA: Diagnosis not present

## 2015-01-31 DIAGNOSIS — E1140 Type 2 diabetes mellitus with diabetic neuropathy, unspecified: Secondary | ICD-10-CM | POA: Diagnosis not present

## 2015-02-06 ENCOUNTER — Emergency Department (HOSPITAL_COMMUNITY): Payer: Medicare Other

## 2015-02-06 ENCOUNTER — Inpatient Hospital Stay (HOSPITAL_COMMUNITY)
Admission: EM | Admit: 2015-02-06 | Discharge: 2015-02-24 | DRG: 233 | Disposition: A | Discharge status: Skilled Nursing Facility | Payer: Medicare Other | Attending: Cardiothoracic Surgery | Admitting: Cardiothoracic Surgery

## 2015-02-06 ENCOUNTER — Encounter (HOSPITAL_COMMUNITY): Payer: Self-pay | Admitting: Emergency Medicine

## 2015-02-06 ENCOUNTER — Inpatient Hospital Stay (HOSPITAL_COMMUNITY): Payer: Medicare Other

## 2015-02-06 DIAGNOSIS — I5032 Chronic diastolic (congestive) heart failure: Secondary | ICD-10-CM | POA: Diagnosis not present

## 2015-02-06 DIAGNOSIS — Z452 Encounter for adjustment and management of vascular access device: Secondary | ICD-10-CM | POA: Diagnosis not present

## 2015-02-06 DIAGNOSIS — Z79899 Other long term (current) drug therapy: Secondary | ICD-10-CM | POA: Diagnosis not present

## 2015-02-06 DIAGNOSIS — J9602 Acute respiratory failure with hypercapnia: Secondary | ICD-10-CM | POA: Diagnosis not present

## 2015-02-06 DIAGNOSIS — Z794 Long term (current) use of insulin: Secondary | ICD-10-CM | POA: Diagnosis not present

## 2015-02-06 DIAGNOSIS — R140 Abdominal distension (gaseous): Secondary | ICD-10-CM

## 2015-02-06 DIAGNOSIS — N189 Chronic kidney disease, unspecified: Secondary | ICD-10-CM

## 2015-02-06 DIAGNOSIS — K3189 Other diseases of stomach and duodenum: Secondary | ICD-10-CM | POA: Diagnosis not present

## 2015-02-06 DIAGNOSIS — I8393 Asymptomatic varicose veins of bilateral lower extremities: Secondary | ICD-10-CM | POA: Diagnosis present

## 2015-02-06 DIAGNOSIS — J9600 Acute respiratory failure, unspecified whether with hypoxia or hypercapnia: Secondary | ICD-10-CM | POA: Diagnosis not present

## 2015-02-06 DIAGNOSIS — I129 Hypertensive chronic kidney disease with stage 1 through stage 4 chronic kidney disease, or unspecified chronic kidney disease: Secondary | ICD-10-CM | POA: Diagnosis not present

## 2015-02-06 DIAGNOSIS — E876 Hypokalemia: Secondary | ICD-10-CM | POA: Diagnosis not present

## 2015-02-06 DIAGNOSIS — E1142 Type 2 diabetes mellitus with diabetic polyneuropathy: Secondary | ICD-10-CM | POA: Diagnosis present

## 2015-02-06 DIAGNOSIS — N183 Chronic kidney disease, stage 3 unspecified: Secondary | ICD-10-CM | POA: Diagnosis present

## 2015-02-06 DIAGNOSIS — J9601 Acute respiratory failure with hypoxia: Secondary | ICD-10-CM | POA: Diagnosis not present

## 2015-02-06 DIAGNOSIS — Z951 Presence of aortocoronary bypass graft: Secondary | ICD-10-CM

## 2015-02-06 DIAGNOSIS — Z7982 Long term (current) use of aspirin: Secondary | ICD-10-CM

## 2015-02-06 DIAGNOSIS — Z01818 Encounter for other preprocedural examination: Secondary | ICD-10-CM

## 2015-02-06 DIAGNOSIS — R197 Diarrhea, unspecified: Secondary | ICD-10-CM | POA: Diagnosis not present

## 2015-02-06 DIAGNOSIS — N179 Acute kidney failure, unspecified: Secondary | ICD-10-CM | POA: Diagnosis not present

## 2015-02-06 DIAGNOSIS — D631 Anemia in chronic kidney disease: Secondary | ICD-10-CM | POA: Diagnosis not present

## 2015-02-06 DIAGNOSIS — K219 Gastro-esophageal reflux disease without esophagitis: Secondary | ICD-10-CM | POA: Diagnosis not present

## 2015-02-06 DIAGNOSIS — R109 Unspecified abdominal pain: Secondary | ICD-10-CM

## 2015-02-06 DIAGNOSIS — R0602 Shortness of breath: Secondary | ICD-10-CM

## 2015-02-06 DIAGNOSIS — I252 Old myocardial infarction: Secondary | ICD-10-CM | POA: Diagnosis not present

## 2015-02-06 DIAGNOSIS — I200 Unstable angina: Secondary | ICD-10-CM

## 2015-02-06 DIAGNOSIS — R1084 Generalized abdominal pain: Secondary | ICD-10-CM | POA: Diagnosis not present

## 2015-02-06 DIAGNOSIS — J811 Chronic pulmonary edema: Secondary | ICD-10-CM | POA: Insufficient documentation

## 2015-02-06 DIAGNOSIS — I255 Ischemic cardiomyopathy: Secondary | ICD-10-CM | POA: Diagnosis present

## 2015-02-06 DIAGNOSIS — J984 Other disorders of lung: Secondary | ICD-10-CM | POA: Diagnosis not present

## 2015-02-06 DIAGNOSIS — R918 Other nonspecific abnormal finding of lung field: Secondary | ICD-10-CM | POA: Diagnosis not present

## 2015-02-06 DIAGNOSIS — I214 Non-ST elevation (NSTEMI) myocardial infarction: Secondary | ICD-10-CM | POA: Diagnosis not present

## 2015-02-06 DIAGNOSIS — I209 Angina pectoris, unspecified: Secondary | ICD-10-CM | POA: Diagnosis not present

## 2015-02-06 DIAGNOSIS — I472 Ventricular tachycardia: Secondary | ICD-10-CM | POA: Diagnosis not present

## 2015-02-06 DIAGNOSIS — I2510 Atherosclerotic heart disease of native coronary artery without angina pectoris: Secondary | ICD-10-CM

## 2015-02-06 DIAGNOSIS — R600 Localized edema: Secondary | ICD-10-CM | POA: Diagnosis not present

## 2015-02-06 DIAGNOSIS — J810 Acute pulmonary edema: Secondary | ICD-10-CM | POA: Diagnosis not present

## 2015-02-06 DIAGNOSIS — J9811 Atelectasis: Secondary | ICD-10-CM | POA: Diagnosis not present

## 2015-02-06 DIAGNOSIS — Z8249 Family history of ischemic heart disease and other diseases of the circulatory system: Secondary | ICD-10-CM

## 2015-02-06 DIAGNOSIS — I501 Left ventricular failure: Secondary | ICD-10-CM | POA: Diagnosis not present

## 2015-02-06 DIAGNOSIS — N289 Disorder of kidney and ureter, unspecified: Secondary | ICD-10-CM | POA: Diagnosis not present

## 2015-02-06 DIAGNOSIS — I25110 Atherosclerotic heart disease of native coronary artery with unstable angina pectoris: Secondary | ICD-10-CM | POA: Diagnosis present

## 2015-02-06 DIAGNOSIS — E119 Type 2 diabetes mellitus without complications: Secondary | ICD-10-CM | POA: Diagnosis not present

## 2015-02-06 DIAGNOSIS — M7989 Other specified soft tissue disorders: Secondary | ICD-10-CM | POA: Diagnosis not present

## 2015-02-06 DIAGNOSIS — Z833 Family history of diabetes mellitus: Secondary | ICD-10-CM

## 2015-02-06 DIAGNOSIS — I5043 Acute on chronic combined systolic (congestive) and diastolic (congestive) heart failure: Secondary | ICD-10-CM | POA: Diagnosis not present

## 2015-02-06 DIAGNOSIS — R9389 Abnormal findings on diagnostic imaging of other specified body structures: Secondary | ICD-10-CM

## 2015-02-06 DIAGNOSIS — D62 Acute posthemorrhagic anemia: Secondary | ICD-10-CM | POA: Diagnosis not present

## 2015-02-06 DIAGNOSIS — K567 Ileus, unspecified: Secondary | ICD-10-CM | POA: Diagnosis present

## 2015-02-06 DIAGNOSIS — I5023 Acute on chronic systolic (congestive) heart failure: Secondary | ICD-10-CM | POA: Diagnosis not present

## 2015-02-06 DIAGNOSIS — R0789 Other chest pain: Secondary | ICD-10-CM | POA: Diagnosis not present

## 2015-02-06 DIAGNOSIS — E785 Hyperlipidemia, unspecified: Secondary | ICD-10-CM | POA: Diagnosis not present

## 2015-02-06 DIAGNOSIS — Z4682 Encounter for fitting and adjustment of non-vascular catheter: Secondary | ICD-10-CM | POA: Diagnosis not present

## 2015-02-06 DIAGNOSIS — I959 Hypotension, unspecified: Secondary | ICD-10-CM | POA: Diagnosis not present

## 2015-02-06 DIAGNOSIS — M6281 Muscle weakness (generalized): Secondary | ICD-10-CM | POA: Diagnosis not present

## 2015-02-06 DIAGNOSIS — Z9981 Dependence on supplemental oxygen: Secondary | ICD-10-CM | POA: Diagnosis not present

## 2015-02-06 DIAGNOSIS — N184 Chronic kidney disease, stage 4 (severe): Secondary | ICD-10-CM | POA: Diagnosis not present

## 2015-02-06 DIAGNOSIS — L8990 Pressure ulcer of unspecified site, unspecified stage: Secondary | ICD-10-CM | POA: Insufficient documentation

## 2015-02-06 DIAGNOSIS — R079 Chest pain, unspecified: Secondary | ICD-10-CM | POA: Diagnosis not present

## 2015-02-06 DIAGNOSIS — M1990 Unspecified osteoarthritis, unspecified site: Secondary | ICD-10-CM | POA: Diagnosis not present

## 2015-02-06 DIAGNOSIS — T82218A Other mechanical complication of coronary artery bypass graft, initial encounter: Secondary | ICD-10-CM

## 2015-02-06 DIAGNOSIS — I509 Heart failure, unspecified: Secondary | ICD-10-CM | POA: Diagnosis not present

## 2015-02-06 DIAGNOSIS — R262 Difficulty in walking, not elsewhere classified: Secondary | ICD-10-CM | POA: Diagnosis not present

## 2015-02-06 DIAGNOSIS — Z48812 Encounter for surgical aftercare following surgery on the circulatory system: Secondary | ICD-10-CM | POA: Diagnosis not present

## 2015-02-06 DIAGNOSIS — R278 Other lack of coordination: Secondary | ICD-10-CM | POA: Diagnosis not present

## 2015-02-06 DIAGNOSIS — R8290 Unspecified abnormal findings in urine: Secondary | ICD-10-CM | POA: Diagnosis not present

## 2015-02-06 DIAGNOSIS — I10 Essential (primary) hypertension: Secondary | ICD-10-CM

## 2015-02-06 DIAGNOSIS — J9690 Respiratory failure, unspecified, unspecified whether with hypoxia or hypercapnia: Secondary | ICD-10-CM | POA: Diagnosis not present

## 2015-02-06 DIAGNOSIS — J90 Pleural effusion, not elsewhere classified: Secondary | ICD-10-CM | POA: Diagnosis not present

## 2015-02-06 HISTORY — DX: Gastro-esophageal reflux disease without esophagitis: K21.9

## 2015-02-06 HISTORY — DX: Type 2 diabetes mellitus with diabetic polyneuropathy: E11.42

## 2015-02-06 HISTORY — DX: Type 2 diabetes mellitus without complications: E11.9

## 2015-02-06 HISTORY — DX: Personal history of other diseases of the musculoskeletal system and connective tissue: Z87.39

## 2015-02-06 HISTORY — DX: Unspecified osteoarthritis, unspecified site: M19.90

## 2015-02-06 HISTORY — DX: Old myocardial infarction: I25.2

## 2015-02-06 HISTORY — DX: Basal cell carcinoma of skin of left ear and external auricular canal: C44.219

## 2015-02-06 HISTORY — DX: Pneumonia, unspecified organism: J18.9

## 2015-02-06 HISTORY — DX: Basal cell carcinoma of skin of other parts of face: C44.319

## 2015-02-06 LAB — APTT: aPTT: 39 seconds — ABNORMAL HIGH (ref 24–37)

## 2015-02-06 LAB — COMPREHENSIVE METABOLIC PANEL
ALK PHOS: 91 U/L (ref 38–126)
ALT: 10 U/L — ABNORMAL LOW (ref 17–63)
ANION GAP: 11 (ref 5–15)
AST: 13 U/L — ABNORMAL LOW (ref 15–41)
Albumin: 4 g/dL (ref 3.5–5.0)
BILIRUBIN TOTAL: 0.9 mg/dL (ref 0.3–1.2)
BUN: 41 mg/dL — AB (ref 6–20)
CO2: 30 mmol/L (ref 22–32)
CREATININE: 2.26 mg/dL — AB (ref 0.61–1.24)
Calcium: 9.4 mg/dL (ref 8.9–10.3)
Chloride: 101 mmol/L (ref 101–111)
GFR calc non Af Amer: 26 mL/min — ABNORMAL LOW (ref >60)
GFR, EST AFRICAN AMERICAN: 30 mL/min — AB (ref >60)
GLUCOSE: 220 mg/dL — AB (ref 65–99)
POTASSIUM: 4.4 mmol/L (ref 3.5–5.1)
Sodium: 142 mmol/L (ref 135–145)
TOTAL PROTEIN: 7.1 g/dL (ref 6.5–8.1)

## 2015-02-06 LAB — PROTIME-INR
INR: 1.16 (ref 0.00–1.49)
PROTHROMBIN TIME: 14.9 s (ref 11.6–15.2)

## 2015-02-06 LAB — CBC WITH DIFFERENTIAL/PLATELET
Basophils Absolute: 0 10*3/uL (ref 0.0–0.1)
Basophils Relative: 0 % (ref 0–1)
Eosinophils Absolute: 0.4 10*3/uL (ref 0.0–0.7)
Eosinophils Relative: 4 % (ref 0–5)
HEMATOCRIT: 37.5 % — AB (ref 39.0–52.0)
HEMOGLOBIN: 12.1 g/dL — AB (ref 13.0–17.0)
LYMPHS ABS: 1 10*3/uL (ref 0.7–4.0)
LYMPHS PCT: 11 % — AB (ref 12–46)
MCH: 30 pg (ref 26.0–34.0)
MCHC: 32.3 g/dL (ref 30.0–36.0)
MCV: 92.8 fL (ref 78.0–100.0)
Monocytes Absolute: 0.8 10*3/uL (ref 0.1–1.0)
Monocytes Relative: 8 % (ref 3–12)
NEUTROS ABS: 7.3 10*3/uL (ref 1.7–7.7)
NEUTROS PCT: 77 % (ref 43–77)
PLATELETS: 172 10*3/uL (ref 150–400)
RBC: 4.04 MIL/uL — ABNORMAL LOW (ref 4.22–5.81)
RDW: 13.3 % (ref 11.5–15.5)
WBC: 9.5 10*3/uL (ref 4.0–10.5)

## 2015-02-06 LAB — TROPONIN I
TROPONIN I: 0.07 ng/mL — AB (ref <0.031)
Troponin I: <0.03 ng/mL (ref <0.031)

## 2015-02-06 LAB — BRAIN NATRIURETIC PEPTIDE: B Natriuretic Peptide: 279 pg/mL — ABNORMAL HIGH (ref 0.0–100.0)

## 2015-02-06 LAB — HEPARIN LEVEL (UNFRACTIONATED): Heparin Unfractionated: 0.11 IU/mL — ABNORMAL LOW (ref 0.30–0.70)

## 2015-02-06 LAB — LACTIC ACID, PLASMA
Lactic Acid, Venous: 0.9 mmol/L (ref 0.5–2.0)
Lactic Acid, Venous: 0.8 mmol/L (ref 0.5–2.0)

## 2015-02-06 LAB — LIPASE, BLOOD: Lipase: 49 U/L (ref 22–51)

## 2015-02-06 MED ORDER — ASPIRIN 81 MG PO CHEW
324.0000 mg | CHEWABLE_TABLET | ORAL | Status: AC
  Administered 2015-02-07: 324 mg via ORAL
  Filled 2015-02-06: qty 4

## 2015-02-06 MED ORDER — ONDANSETRON HCL 4 MG/2ML IJ SOLN
4.0000 mg | Freq: Four times a day (QID) | INTRAMUSCULAR | Status: DC | PRN
  Administered 2015-02-10 – 2015-02-17 (×3): 4 mg via INTRAVENOUS
  Filled 2015-02-06 (×3): qty 2

## 2015-02-06 MED ORDER — NITROGLYCERIN 0.4 MG SL SUBL
0.4000 mg | SUBLINGUAL_TABLET | Freq: Once | SUBLINGUAL | Status: AC
  Administered 2015-02-06: 0.4 mg via SUBLINGUAL
  Filled 2015-02-06: qty 1

## 2015-02-06 MED ORDER — NITROGLYCERIN IN D5W 200-5 MCG/ML-% IV SOLN
5.0000 ug/min | Freq: Once | INTRAVENOUS | Status: AC
  Administered 2015-02-06: 5 ug/min via INTRAVENOUS
  Filled 2015-02-06: qty 250

## 2015-02-06 MED ORDER — SODIUM CHLORIDE 0.9 % IV SOLN
INTRAVENOUS | Status: DC
  Administered 2015-02-06: 17:00:00 via INTRAVENOUS

## 2015-02-06 MED ORDER — HEPARIN (PORCINE) IN NACL 100-0.45 UNIT/ML-% IJ SOLN
1450.0000 [IU]/h | INTRAMUSCULAR | Status: DC
  Administered 2015-02-06: 1100 [IU]/h via INTRAVENOUS
  Administered 2015-02-07: 1400 [IU]/h via INTRAVENOUS
  Administered 2015-02-08: 1450 [IU]/h via INTRAVENOUS
  Administered 2015-02-08: 1400 [IU]/h via INTRAVENOUS
  Filled 2015-02-06 (×4): qty 250

## 2015-02-06 MED ORDER — INSULIN ASPART 100 UNIT/ML ~~LOC~~ SOLN
0.0000 [IU] | SUBCUTANEOUS | Status: DC
  Administered 2015-02-07 (×2): 2 [IU] via SUBCUTANEOUS
  Administered 2015-02-07: 3 [IU] via SUBCUTANEOUS
  Administered 2015-02-07: 2 [IU] via SUBCUTANEOUS

## 2015-02-06 MED ORDER — HEPARIN BOLUS VIA INFUSION
4000.0000 [IU] | Freq: Once | INTRAVENOUS | Status: AC
  Administered 2015-02-06: 4000 [IU] via INTRAVENOUS

## 2015-02-06 MED ORDER — ASPIRIN EC 81 MG PO TBEC
81.0000 mg | DELAYED_RELEASE_TABLET | Freq: Every day | ORAL | Status: DC
  Administered 2015-02-07 – 2015-02-10 (×3): 81 mg via ORAL
  Filled 2015-02-06 (×3): qty 1

## 2015-02-06 MED ORDER — LABETALOL HCL 300 MG PO TABS
300.0000 mg | ORAL_TABLET | Freq: Two times a day (BID) | ORAL | Status: DC
  Administered 2015-02-07 – 2015-02-10 (×9): 300 mg via ORAL
  Filled 2015-02-06 (×21): qty 1

## 2015-02-06 MED ORDER — ACETAMINOPHEN 325 MG PO TABS: 650.0000 mg | ORAL_TABLET | ORAL | Status: DC | PRN

## 2015-02-06 MED ORDER — ATORVASTATIN CALCIUM 20 MG PO TABS
20.0000 mg | ORAL_TABLET | Freq: Every day | ORAL | Status: DC
  Administered 2015-02-07 – 2015-02-13 (×7): 20 mg via ORAL
  Filled 2015-02-06 (×8): qty 1

## 2015-02-06 MED ORDER — NITROGLYCERIN IN D5W 200-5 MCG/ML-% IV SOLN
0.0000 ug/min | INTRAVENOUS | Status: DC
  Administered 2015-02-07: 45 ug/min via INTRAVENOUS
  Administered 2015-02-07: 30 ug/min via INTRAVENOUS
  Administered 2015-02-08 (×2): 45 ug/min via INTRAVENOUS
  Administered 2015-02-10: 100 ug/min via INTRAVENOUS
  Administered 2015-02-10: 45 ug/min via INTRAVENOUS
  Filled 2015-02-06 (×6): qty 250

## 2015-02-06 MED ORDER — ASPIRIN 300 MG RE SUPP: 300.0000 mg | RECTAL | Status: AC

## 2015-02-06 MED ORDER — SODIUM CHLORIDE 0.9 % IV SOLN
INTRAVENOUS | Status: AC
Start: 2015-02-06 — End: 2015-02-07

## 2015-02-06 NOTE — ED Notes (Signed)
MD and lab in room with pt at this time

## 2015-02-06 NOTE — ED Notes (Signed)
Pt. Sister Zachary Mcmahon can be reached at [phone] (cell) [phone] (home)

## 2015-02-06 NOTE — ED Provider Notes (Signed)
CSN: [id number]     Arrival date & time 02/06/15  1545 History   First MD Initiated Contact with Patient 02/06/15 1558     Chief Complaint  Patient presents with  . Chest Pain     (Consider location/radiation/quality/duration/timing/severity/associated sxs/prior Treatment) Patient is a 81 y.o. male presenting with chest pain. The history is provided by the patient.  Chest Pain Pain location:  Substernal area Associated symptoms: no abdominal pain, no back pain, no headache, no nausea, no numbness, no shortness of breath, not vomiting and no weakness   patient with chest pain in his upper chest going to both arms. Began this afternoon. States it feels like his previous reflux but also has had a previous MI. Has seen cardiology for does have a stress test but never cath. Echocardiogram showed previous infarct. Found to be hypertensive. Chest pain is improved to around 2-3 out of 10 after nitroglycerin. He was given aspirin by EMS. No shortness of breath. No cough. No radiation of the back. No numbness or weakness. No fevers. His left lower leg is chronically swollen. States he's been told that he has not had a DVT in that leg. He states he has seen vascular surgery for several times. No headache. No blood in the stool.  Past Medical History  Diagnosis Date  . Hypertension   . High potassium   . Varicose veins   . Hyperlipidemia   . Basal cell carcinoma of forehead 2016 X 2  . Basal cell carcinoma of left earlobe   . Old myocardial infarct     "sometime in the past; don't know when" (02/07/2015)  . Type II diabetes mellitus   . Pneumonia X 1  . GERD (gastroesophageal reflux disease)   . Arthritis     "legs" (02/07/2015)  . History of gout X 1  . Diabetic peripheral neuropathy     "left foot" (02/07/2015)   Past Surgical History  Procedure Laterality Date  . Knee arthroscopy Right ~ 2008  . Orif hip fracture  03/20/2012    Procedure: OPEN REDUCTION INTERNAL FIXATION HIP;  Surgeon:  Sanjuana Kava, MD;  Location: [facility];  Service: Orthopedics;  Laterality: Right;  . Lower extremity venous doppler  07/11/2012    No evidence of DVT in the left lower extremity. Evidence of partially recanalized, chronic, non-obstructive thrombus in the left great SV and its branches consistent with significant reflux consistent with post-phlebitic syndrome. Significant reflux of the left short saphenous vein.  . Cardiovascular stress test  05/29/2012    Mild-moderate perfusion defect due to infarct/scar with mild-moderate perinfarct ischemia seen in the mid anterior, apical anterior, apical septal, and apical regions. No ECG changes. Global LV systolic function is severely reduced.  . Transthoracic echocardiogram  04/18/2012    EF 45%, mild-moderate LVH  . Transthoracic echocardiogram  04/18/12    EF% 45%.SEVERE HYPOKINESIS TO AKINESIS OF THE MID-DISTAL INFEROLATERAL MYOCARDIUM AND MUCH OF THE APEX.  Marland Kitchen Lexiscam myocardial perfusion  05/29/12    MARKED PERFUSION DEFECT DUE TO INFARC/SCAR WITH MILD PERINFARCT ISCHEMIA IN THE BASAL INFERIOR, MID INFEROSEPTAL, MID INFERIOR AND APICALINFERIOR REGION. EF%33%. PERIINFARCT ISCHEMIA IN THE MID ANTERIOR, APICAL ANTERIOR, APICAL SEPTAL AND APICAL REGIONS.  . Fracture surgery    . Cystoscopy w/ stone manipulation  X 1  . Basal cell carcinoma excision      "probably 1/2 dozen cut off face, left ear" (02/07/2015)   Family History  Problem Relation Age of Onset  . Deep vein thrombosis Mother   .  Hypertension Mother   . Hypertension Sister   . Diabetes Brother   . Hyperlipidemia Brother   . Hypertension Brother   . Heart attack Brother    History  Substance Use Topics  . Smoking status: Never Smoker   . Smokeless tobacco: Never Used  . Alcohol Use: No    Review of Systems  Constitutional: Negative for activity change and appetite change.  Eyes: Negative for pain.  Respiratory: Negative for chest tightness and shortness of breath.   Cardiovascular:  Positive for chest pain and leg swelling.  Gastrointestinal: Positive for diarrhea and abdominal distention. Negative for nausea, vomiting and abdominal pain.  Genitourinary: Negative for flank pain.  Musculoskeletal: Negative for back pain and neck stiffness.  Skin: Negative for rash.  Neurological: Negative for weakness, numbness and headaches.  Psychiatric/Behavioral: Negative for behavioral problems.      Allergies  Bactrim  Home Medications   Prior to Admission medications   Medication Sig Start Date End Date Taking? Authorizing Provider  aspirin EC 81 MG tablet Take 81 mg by mouth every morning.   Yes Historical Provider, MD  atorvastatin (LIPITOR) 20 MG tablet TAKE 1 TABLET BY MOUTH DAILY AT 6:00 P.M. ( MUST SCHEDULE AN APPOINTMENT FOR FUTURE REFILLS ) 03/27/14  Yes Lorretta Harp, MD  cloNIDine (CATAPRES) 0.2 MG tablet Take 0.2 mg by mouth 2 (two) times daily. 01/20/15  Yes Historical Provider, MD  ferrous sulfate 325 (65 FE) MG tablet Take 325 mg by mouth 2 (two) times daily.   Yes Historical Provider, MD  furosemide (LASIX) 40 MG tablet Take 40 mg by mouth 2 (two) times daily.  01/20/15  Yes Historical Provider, MD  gabapentin (NEURONTIN) 100 MG capsule Take 100 mg by mouth 3 (three) times daily.  04/01/14  Yes Historical Provider, MD  labetalol (NORMODYNE) 300 MG tablet Take 300 mg by mouth 2 (two) times daily.   Yes Historical Provider, MD  LEVEMIR FLEXTOUCH 100 UNIT/ML Pen Inject 36 Units into the skin at bedtime.  01/20/15  Yes Historical Provider, MD  losartan (COZAAR) 50 MG tablet Take 50 mg by mouth 2 (two) times daily.  01/21/15  Yes Historical Provider, MD  omeprazole (PRILOSEC) 20 MG capsule Take 20 mg by mouth daily.  04/02/14  Yes Historical Provider, MD  potassium chloride SA (K-DUR,KLOR-CON) 20 MEQ tablet Take 20 mEq by mouth daily. 04/03/14  Yes Historical Provider, MD  traMADol (ULTRAM) 50 MG tablet Take 50 mg by mouth 3 (three) times daily.  02/13/14  Yes Historical  Provider, MD  insulin aspart (NOVOLOG) 100 UNIT/ML injection Inject 0-5 Units into the skin at bedtime. 03/23/12 03/23/13  Delfina Redwood, MD  insulin aspart (NOVOLOG) 100 UNIT/ML injection Inject 0-9 Units into the skin 3 (three) times daily with meals. 03/23/12 03/23/13  Delfina Redwood, MD   BP 151/58 mmHg  Pulse 74  Temp(Src) 98 F (36.7 C) (Oral)  Resp 16  Ht 5\' 9"  (1.753 m)  Wt 205 lb (92.987 kg)  BMI 30.26 kg/m2  SpO2 94% Physical Exam  Constitutional: He is oriented to person, place, and time. He appears well-developed and well-nourished.  HENT:  Head: Normocephalic and atraumatic.  Eyes: Pupils are equal, round, and reactive to light.  Neck: Normal range of motion. Neck supple.  Cardiovascular: Normal rate, regular rhythm and normal heart sounds.   No murmur heard. Good pulse in bilateral wrist.  Pulmonary/Chest: Effort normal and breath sounds normal.  Abdominal: Soft. He exhibits distension. He exhibits no  mass. There is no tenderness. There is no rebound and no guarding.  Reducible umbilical hernia  Musculoskeletal: Normal range of motion. He exhibits edema.  Edema to left lower extremity, pitting. Unchanged and chronic per patient.  Neurological: He is alert and oriented to person, place, and time. No cranial nerve deficit.  Skin: Skin is warm and dry.  Psychiatric: He has a normal mood and affect.  Nursing note and vitals reviewed.   ED Course  Procedures (including critical care time) Labs Review Labs Reviewed  COMPREHENSIVE METABOLIC PANEL - Abnormal; Notable for the following:    Glucose, Bld 220 (*)    BUN 41 (*)    Creatinine, Ser 2.26 (*)    AST 13 (*)    ALT 10 (*)    GFR calc non Af Amer 26 (*)    GFR calc Af Amer 30 (*)    All other components within normal limits  APTT - Abnormal; Notable for the following:    aPTT 39 (*)    All other components within normal limits  CBC WITH DIFFERENTIAL/PLATELET - Abnormal; Notable for the following:    RBC  4.04 (*)    Hemoglobin 12.1 (*)    HCT 37.5 (*)    Lymphocytes Relative 11 (*)    All other components within normal limits  BRAIN NATRIURETIC PEPTIDE - Abnormal; Notable for the following:    B Natriuretic Peptide 279.0 (*)    All other components within normal limits  HEPARIN LEVEL (UNFRACTIONATED) - Abnormal; Notable for the following:    Heparin Unfractionated 0.11 (*)    All other components within normal limits  URINALYSIS, ROUTINE W REFLEX MICROSCOPIC (NOT AT [facility]) - Abnormal; Notable for the following:    pH 8.5 (*)    Protein, ur 30 (*)    All other components within normal limits  TROPONIN I - Abnormal; Notable for the following:    Troponin I 0.07 (*)    All other components within normal limits  TROPONIN I - Abnormal; Notable for the following:    Troponin I 1.37 (*)    All other components within normal limits  TROPONIN I - Abnormal; Notable for the following:    Troponin I 2.48 (*)    All other components within normal limits  TROPONIN I - Abnormal; Notable for the following:    Troponin I 3.03 (*)    All other components within normal limits  BASIC METABOLIC PANEL - Abnormal; Notable for the following:    Glucose, Bld 196 (*)    BUN 36 (*)    Creatinine, Ser 1.95 (*)    GFR calc non Af Amer 31 (*)    GFR calc Af Amer 36 (*)    All other components within normal limits  CBC - Abnormal; Notable for the following:    WBC 11.5 (*)    RBC 3.64 (*)    Hemoglobin 10.8 (*)    HCT 32.9 (*)    All other components within normal limits  GLUCOSE, CAPILLARY - Abnormal; Notable for the following:    Glucose-Capillary 189 (*)    All other components within normal limits  LIPID PANEL - Abnormal; Notable for the following:    HDL 17 (*)    All other components within normal limits  GLUCOSE, CAPILLARY - Abnormal; Notable for the following:    Glucose-Capillary 202 (*)    All other components within normal limits  GLUCOSE, CAPILLARY - Abnormal; Notable for the following:  Glucose-Capillary 169 (*)    All other components within normal limits  GLUCOSE, CAPILLARY - Abnormal; Notable for the following:    Glucose-Capillary 188 (*)    All other components within normal limits  GLUCOSE, CAPILLARY - Abnormal; Notable for the following:    Glucose-Capillary 185 (*)    All other components within normal limits  GLUCOSE, CAPILLARY - Abnormal; Notable for the following:    Glucose-Capillary 203 (*)    All other components within normal limits  MRSA PCR SCREENING  CLOSTRIDIUM DIFFICILE BY PCR (NOT AT [facility])  OVA AND PARASITE EXAMINATION  STOOL CULTURE  TROPONIN I  PROTIME-INR  LIPASE, BLOOD  LACTIC ACID, PLASMA  LACTIC ACID, PLASMA  TSH  MAGNESIUM  HEPARIN LEVEL (UNFRACTIONATED)  HEPARIN LEVEL (UNFRACTIONATED)  URINE MICROSCOPIC-ADD ON  CBC  FECAL LACTOFERRIN  HEMOGLOBIN A1C  CBC  HEPARIN LEVEL (UNFRACTIONATED)    Imaging Review Dg Chest Portable 1 View  02/06/2015   CLINICAL DATA:  Chest pain radiating to both arms beginning today  EXAM: PORTABLE CHEST - 1 VIEW  COMPARISON:  03/20/2012  FINDINGS: Mild enlargement of the cardiomediastinal silhouette with central vascular congestion reidentified. A few interstitial Kerley B-lines are noted at the lung bases which could indicate interstitial edema. No focal pulmonary opacity allowing for technique. No pleural effusion. No acute osseous finding.  IMPRESSION: Mild enlargement of the cardiomediastinal silhouette with suggestion of early interstitial edema. If symptoms persist, consider PA and lateral chest radiographs obtained at full inspiration when the patient is clinically able.   Electronically Signed   By: Conchita Paris M.D.   On: 02/06/2015 16:45   Dg Abd 2 Views  02/06/2015   CLINICAL DATA:  81 year old male with generalized abdominal pain and chest pain radiating to the arms since 1330 hrs. Initial encounter.  EXAM: ABDOMEN - 2 VIEW  COMPARISON:  Portable chest radiograph 1620 hr today. Lumbar MRI  10/21/2009.  FINDINGS: Upright and supine views of the abdomen and pelvis. Moderate gaseous distension of the stomach. Mild elevation of the left hemidiaphragm. No pneumoperitoneum.  Gas-filled but nondilated small and large bowel loops throughout the abdomen and pelvis. Non obstructed pattern. Osteopenia. Postoperative changes to the proximal right femur. No definite acute osseous abnormality.  IMPRESSION: 1. Gaseous distension of the stomach, and gas throughout nondilated small and large bowel loops. The appearance might reflect ileus but does not suggest a mechanical bowel obstruction. 2. No free air.   Electronically Signed   By: Genevie Ann M.D.   On: 02/06/2015 19:52     EKG Interpretation   Date/Time:  Friday February 06 2015 16:10:44 EDT Ventricular Rate:  84 PR Interval:  221 QRS Duration: 92 QT Interval:  404 QTC Calculation: 478 R Axis:   86 Text Interpretation:  Sinus rhythm Prolonged PR interval Borderline right  axis deviation ST depression anteriorly. T wave inversion inferiorly since  last tracing.  Confirmed by Alvino Chapel  MD, Jaycelynn Knickerbocker [phone]) on 02/06/2015  4:24:47 PM      EKG Interpretation  Date/Time:  Friday February 06 2015 16:57:59 EDT Ventricular Rate:  81 PR Interval:  217 QRS Duration: 102 QT Interval:  399 QTC Calculation: 463 R Axis:   84 Text Interpretation:  Sinus rhythm Borderline prolonged PR interval Borderline right axis deviation unchanged from trcing earlier today. Confirmed by Alvino Chapel  MD, Ovid Curd [phone]) on 02/06/2015 5:07:01 PM        MDM   Final diagnoses:  Unstable angina  Renal insufficiency    Patient with  chest pain. EKGs show nonspecific changes but worrisome ST changes. Abdomen is somewhat distended. Does have some renal insufficiency 2. Discuss with cardiology but initially recommended that the patient can stay up at [facility], however discussed with hospitalist who recommended transfer to [facility]. Heparin and nitroglycerin  started.  CRITICAL CARE Performed by: Mackie Pai Total critical care time: 30 Critical care time was exclusive of separately billable procedures and treating other patients. Critical care was necessary to treat or prevent imminent or life-threatening deterioration. Critical care was time spent personally by me on the following activities: development of treatment plan with patient and/or surrogate as well as nursing, discussions with consultants, evaluation of patient's response to treatment, examination of patient, obtaining history from patient or surrogate, ordering and performing treatments and interventions, ordering and review of laboratory studies, ordering and review of radiographic studies, pulse oximetry and re-evaluation of patient's condition.     [name], MD 02/07/15 [phone]

## 2015-02-06 NOTE — Progress Notes (Signed)
ANTICOAGULATION CONSULT NOTE - Follow-up Consult  Pharmacy Consult for Heparin Indication: chest pain/ACS  Allergies  Allergen Reactions  . Bactrim [Sulfamethoxazole-Trimethoprim] Rash   Patient Measurements: Height: 5' 9.5" (176.5 cm) Weight: 205 lb (92.987 kg) IBW/kg (Calculated) : 71.85 HEPARIN DW (KG): 90.8  Vital Signs: Temp: 97.8 F (36.6 C) (06/17 1549) Temp Source: Oral (06/17 1549) BP: 169/85 mmHg (06/17 2215) Pulse Rate: 81 (06/17 2215)  Labs:  Recent Labs  02/06/15 1611 02/06/15 1612 02/06/15 1835 02/06/15 2147  HGB  --  12.1*  --   --   HCT  --  37.5*  --   --   PLT  --  172  --   --   APTT  --  39*  --   --   LABPROT  --  14.9  --   --   INR  --  1.16  --   --   HEPARINUNFRC  --   --   --  0.11*  CREATININE 2.26*  --   --   --   TROPONINI <0.03  --  0.07*  --    Estimated Creatinine Clearance: 29.6 mL/min (by C-G formula based on Cr of 2.26).  Assessment: 81yo male on heparin for r/o ACS. Trop up to 0.07. Heparin level 0.11 on 1100 units/hr (only drawn 6 hour post start). No issues with line or bleeding reported.    Goal of Therapy:  Heparin level 0.3-0.7 units/ml Monitor platelets by anticoagulation protocol: Yes   Plan:   Rebolus heparin 2500 units bolus now  Increase heparin infusion to 1400 units/hr  Heparin level in 8 hrs  CBC and heparin level daily   Sherlon Handing, PharmD, BCPS Clinical pharmacist, pager [phone] 02/06/2015,11:58 PM

## 2015-02-06 NOTE — Progress Notes (Signed)
ANTICOAGULATION CONSULT NOTE - Initial Consult  Pharmacy Consult for Heparin Indication: chest pain/ACS  Allergies  Allergen Reactions  . Bactrim [Sulfamethoxazole-Trimethoprim] Rash   Patient Measurements: Height: 5' 9.5" (176.5 cm) Weight: 205 lb (92.987 kg) IBW/kg (Calculated) : 71.85 HEPARIN DW (KG): 90.8  Vital Signs: Temp: 97.8 F (36.6 C) (06/17 1549) Temp Source: Oral (06/17 1549) BP: 145/98 mmHg (06/17 1641) Pulse Rate: 83 (06/17 1641)  Labs:  Recent Labs  02/06/15 1611  CREATININE 2.26*  TROPONINI <0.03   Estimated Creatinine Clearance: 29.6 mL/min (by C-G formula based on Cr of 2.26).  Medical History: Past Medical History  Diagnosis Date  . Hypertension   . Diabetes mellitus   . High potassium   . Varicose veins   . Myocardial infarction   . DVT (deep venous thrombosis)   . Hyperlipidemia    Medications:   (Not in a hospital admission)  Assessment: 81yo male presented to ED.  Asked to initiate Heparin for ACS.    Goal of Therapy:  Heparin level 0.3-0.7 units/ml Monitor platelets by anticoagulation protocol: Yes   Plan:   Heparin 4000 units bolus now  Heparin infusion at 1100 units/hr  Heparin level in 6-8 hrs then daily   CBC daily while on Heparin  [place] Crane, Layce Sprung A 02/06/2015,4:58 PM

## 2015-02-06 NOTE — ED Notes (Signed)
Pt. Family updated on plan to transfer pt. To [facility]

## 2015-02-06 NOTE — ED Notes (Signed)
Dr. Shanon Brow paged and notified of Troponin 0.07.

## 2015-02-06 NOTE — H&P (Signed)
PCP:   [name], MD   Chief Complaint:  Chest pain, abdominal pain  HPI: 81 yo male h/o dm, htn, abnormal myoview testing in 2013, no previous heart cath comes in with chest pain across his lower chest along with generalized abdominal pain.  Pt reports this started at 130pm  Pain was mostly in the abdomen, he thought it was his heartburn.  Than it started going up in his chest.  Denies any sob or fevers or cough.  He has chronic swelling to his left leg which he says is due to his varicose veins.  He has had diarrhea for about 2 weeks.  Pain is worse about 30 minutes after eating and is associated with nausea but no vomiting.  Pt reports his belly is more bloated than normal since yesterday.  He is passing gas, last loose stool was yesterday.  He has been on abx recently for his cellulitis in leg which is resolved.  pts pain in both his chest and abdomen have been relieved with ntg gtt in the ED.    Review of Systems:  Positive and negative as per HPI otherwise all other systems are negative  Past Medical History: Past Medical History  Diagnosis Date  . Hypertension   . Diabetes mellitus   . High potassium   . Varicose veins   . Myocardial infarction   . DVT (deep venous thrombosis)   . Hyperlipidemia    Past Surgical History  Procedure Laterality Date  . Knee surgery      Right knee, arthroscopic.  Marland Kitchen Orif hip fracture  03/20/2012    Procedure: OPEN REDUCTION INTERNAL FIXATION HIP;  Surgeon: Sanjuana Kava, MD;  Location: [facility];  Service: Orthopedics;  Laterality: Right;  . Lower extremity venous doppler  07/11/2012    No evidence of DVT in the left lower extremity. Evidence of partially recanalized, chronic, non-obstructive thrombus in the left great SV and its branches consistent with significant reflux consistent with post-phlebitic syndrome. Significant reflux of the left short saphenous vein.  . Cardiovascular stress test  05/29/2012    Mild-moderate perfusion defect due to  infarct/scar with mild-moderate perinfarct ischemia seen in the mid anterior, apical anterior, apical septal, and apical regions. No ECG changes. Global LV systolic function is severely reduced.  . Transthoracic echocardiogram  04/18/2012    EF 45%, mild-moderate LVH  . Transthoracic echocardiogram  04/18/12    EF% 45%.SEVERE HYPOKINESIS TO AKINESIS OF THE MID-DISTAL INFEROLATERAL MYOCARDIUM AND MUCH OF THE APEX.  Marland Kitchen Lexiscam myocardial perfusion  05/29/12    MARKED PERFUSION DEFECT DUE TO INFARC/SCAR WITH MILD PERINFARCT ISCHEMIA IN THE BASAL INFERIOR, MID INFEROSEPTAL, MID INFERIOR AND APICALINFERIOR REGION. EF%33%. PERIINFARCT ISCHEMIA IN THE MID ANTERIOR, APICAL ANTERIOR, APICAL SEPTAL AND APICAL REGIONS.    Medications: Prior to Admission medications   Medication Sig Start Date End Date Taking? Authorizing Provider  aspirin EC 81 MG tablet Take 81 mg by mouth every morning.   Yes Historical Provider, MD  atorvastatin (LIPITOR) 20 MG tablet TAKE 1 TABLET BY MOUTH DAILY AT 6:00 P.M. ( MUST SCHEDULE AN APPOINTMENT FOR FUTURE REFILLS ) 03/27/14  Yes Lorretta Harp, MD  cloNIDine (CATAPRES) 0.2 MG tablet Take 0.2 mg by mouth 2 (two) times daily. 01/20/15  Yes Historical Provider, MD  ferrous sulfate 325 (65 FE) MG tablet Take 325 mg by mouth 2 (two) times daily.   Yes Historical Provider, MD  furosemide (LASIX) 40 MG tablet Take 40 mg by mouth 2 (two)  times daily.  01/20/15  Yes Historical Provider, MD  gabapentin (NEURONTIN) 100 MG capsule Take 100 mg by mouth 3 (three) times daily.  04/01/14  Yes Historical Provider, MD  labetalol (NORMODYNE) 300 MG tablet Take 300 mg by mouth 2 (two) times daily.   Yes Historical Provider, MD  LEVEMIR FLEXTOUCH 100 UNIT/ML Pen Inject 36 Units into the skin at bedtime.  01/20/15  Yes Historical Provider, MD  losartan (COZAAR) 50 MG tablet Take 50 mg by mouth 2 (two) times daily.  01/21/15  Yes Historical Provider, MD  omeprazole (PRILOSEC) 20 MG capsule Take 20 mg by  mouth daily.  04/02/14  Yes Historical Provider, MD  potassium chloride SA (K-DUR,KLOR-CON) 20 MEQ tablet Take 20 mEq by mouth daily. 04/03/14  Yes Historical Provider, MD  traMADol (ULTRAM) 50 MG tablet Take 50 mg by mouth 3 (three) times daily.  02/13/14  Yes Historical Provider, MD  insulin aspart (NOVOLOG) 100 UNIT/ML injection Inject 0-5 Units into the skin at bedtime. 03/23/12 03/23/13  Delfina Redwood, MD  insulin aspart (NOVOLOG) 100 UNIT/ML injection Inject 0-9 Units into the skin 3 (three) times daily with meals. 03/23/12 03/23/13  Delfina Redwood, MD    Allergies:   Allergies  Allergen Reactions  . Bactrim [Sulfamethoxazole-Trimethoprim] Rash    Social History:  reports that he has never smoked. He has never used smokeless tobacco. He reports that he does not drink alcohol or use illicit drugs.  Family History: Family History  Problem Relation Age of Onset  . Deep vein thrombosis Mother   . Hypertension Mother   . Hypertension Sister   . Diabetes Brother   . Hyperlipidemia Brother   . Hypertension Brother   . Heart attack Brother     Physical Exam: Filed Vitals:   02/06/15 1830 02/06/15 1840 02/06/15 1900 02/06/15 1930  BP: 170/87 171/76 169/76 178/79  Pulse: 82 79 81 81  Temp:      TempSrc:      Resp: 21 10 12 12   Height:      Weight:      SpO2: 98% 99% 99% 99%   General appearance: alert, cooperative and no distress Head: Normocephalic, without obvious abnormality, atraumatic Eyes: negative Nose: Nares normal. Septum midline. Mucosa normal. No drainage or sinus tenderness. Neck: no JVD and supple, symmetrical, trachea midline Lungs: clear to auscultation bilaterally Heart: regular rate and rhythm, S1, S2 normal, no murmur, click, rub or gallop Abdomen: soft, protuberant, mild distended hyperactive bs, no r/g nonacute abdomen Extremities: extremities normal, atraumatic, no cyanosis or edema x left leg swelling mild Pulses: 2+ and symmetric Skin: Skin color,  texture, turgor normal. No rashes or lesions Neurologic: Grossly normal    Labs on Admission:   Recent Labs  02/06/15 1611  NA 142  K 4.4  CL 101  CO2 30  GLUCOSE 220*  BUN 41*  CREATININE 2.26*  CALCIUM 9.4    Recent Labs  02/06/15 1611  AST 13*  ALT 10*  ALKPHOS 91  BILITOT 0.9  PROT 7.1  ALBUMIN 4.0    Recent Labs  02/06/15 1612  WBC 9.5  NEUTROABS 7.3  HGB 12.1*  HCT 37.5*  MCV 92.8  PLT 172    Recent Labs  02/06/15 1611 02/06/15 1835  TROPONINI <0.03 0.07*   Radiological Exams on Admission: Dg Chest Portable 1 View  02/06/2015   CLINICAL DATA:  Chest pain radiating to both arms beginning today  EXAM: PORTABLE CHEST - 1 VIEW  COMPARISON:  03/20/2012  FINDINGS: Mild enlargement of the cardiomediastinal silhouette with central vascular congestion reidentified. A few interstitial Kerley B-lines are noted at the lung bases which could indicate interstitial edema. No focal pulmonary opacity allowing for technique. No pleural effusion. No acute osseous finding.  IMPRESSION: Mild enlargement of the cardiomediastinal silhouette with suggestion of early interstitial edema. If symptoms persist, consider PA and lateral chest radiographs obtained at full inspiration when the patient is clinically able.   Electronically Signed   By: Conchita Paris M.D.   On: 02/06/2015 16:45   Dg Abd 2 Views  02/06/2015   CLINICAL DATA:  81 year old male with generalized abdominal pain and chest pain radiating to the arms since 1330 hrs. Initial encounter.  EXAM: ABDOMEN - 2 VIEW  COMPARISON:  Portable chest radiograph 1620 hr today. Lumbar MRI 10/21/2009.  FINDINGS: Upright and supine views of the abdomen and pelvis. Moderate gaseous distension of the stomach. Mild elevation of the left hemidiaphragm. No pneumoperitoneum.  Gas-filled but nondilated small and large bowel loops throughout the abdomen and pelvis. Non obstructed pattern. Osteopenia. Postoperative changes to the proximal  right femur. No definite acute osseous abnormality.  IMPRESSION: 1. Gaseous distension of the stomach, and gas throughout nondilated small and large bowel loops. The appearance might reflect ileus but does not suggest a mechanical bowel obstruction. 2. No free air.   Electronically Signed   By: Genevie Ann M.D.   On: 02/06/2015 19:52   Old records reviewed Case discussed with dr pickering edp ekg reviewed- nsr tw abnormaltiy inf leads, no acute changes cxr reviewed- no infiltrate no edema   Assessment/Plan  81 yo male with atypical chest pain associated with generalized abdominal pain with mild elevation of troponin with abnormal stress testing in 2013  Principal Problem:   Chest pain- pain atypical, but due to multiple risk factors in addition to ekg changes along with abnormal stress testing in 2013, agree with ED in treating for [place] until serial cardiac markers completed and evaluated by cardiology team.  Cardiology dr Harrington Challenger has been called, and recommended patient to stay at [facility] but due to lack of cardiology coverage here will transfer to [place].  Continue heparin and ntg drips until rules out.  Order cardiac echo in am.     Active Problems:   HTN (hypertension)-  stable   Hyperlipidemia- cont statin   Coronary artery disease- noted   CKD (chronic kidney disease) stage 3, GFR 30-59 ml/min with mild worsening-  ivf overnight, appears mildly dehydrated.  Check ua.   Abdominal pain, acute, generalized- secondary to probable ileus   Diarrhea-  See ilieus   Swelling of extremity, left, chronic-noted pt states his leg always looks a little swollen, has had several recent u/s per his report that are normal   Ileus  Probable- suspect early ileus.  Check lactic acid level.  Keep npo.  Check stool cultures and cdiff.  Transfer to [place] stepdown.  Full code.  Zamorah Ailes A 02/06/2015, 7:55 PM

## 2015-02-07 ENCOUNTER — Encounter (HOSPITAL_COMMUNITY): Payer: Self-pay | Admitting: General Practice

## 2015-02-07 ENCOUNTER — Inpatient Hospital Stay (HOSPITAL_COMMUNITY): Payer: Medicare Other

## 2015-02-07 DIAGNOSIS — I10 Essential (primary) hypertension: Secondary | ICD-10-CM

## 2015-02-07 DIAGNOSIS — I25110 Atherosclerotic heart disease of native coronary artery with unstable angina pectoris: Secondary | ICD-10-CM

## 2015-02-07 DIAGNOSIS — I214 Non-ST elevation (NSTEMI) myocardial infarction: Secondary | ICD-10-CM

## 2015-02-07 DIAGNOSIS — R079 Chest pain, unspecified: Secondary | ICD-10-CM

## 2015-02-07 DIAGNOSIS — I209 Angina pectoris, unspecified: Secondary | ICD-10-CM

## 2015-02-07 LAB — URINALYSIS, ROUTINE W REFLEX MICROSCOPIC
Bilirubin Urine: NEGATIVE
Glucose, UA: NEGATIVE mg/dL
Hgb urine dipstick: NEGATIVE
Ketones, ur: NEGATIVE mg/dL
LEUKOCYTES UA: NEGATIVE
Nitrite: NEGATIVE
PH: 8.5 — AB (ref 5.0–8.0)
PROTEIN: 30 mg/dL — AB
SPECIFIC GRAVITY, URINE: 1.016 (ref 1.005–1.030)
UROBILINOGEN UA: 0.2 mg/dL (ref 0.0–1.0)

## 2015-02-07 LAB — TROPONIN I
TROPONIN I: 2.48 ng/mL — AB (ref <0.031)
Troponin I: 1.37 ng/mL (ref <0.031)
Troponin I: 3.03 ng/mL (ref <0.031)

## 2015-02-07 LAB — LIPID PANEL
CHOL/HDL RATIO: 4.9 ratio
Cholesterol: 83 mg/dL (ref 0–200)
HDL: 17 mg/dL — ABNORMAL LOW (ref >40)
LDL CALC: 48 mg/dL (ref 0–99)
Triglycerides: 91 mg/dL (ref <150)
VLDL: 18 mg/dL (ref 0–40)

## 2015-02-07 LAB — BASIC METABOLIC PANEL
Anion gap: 10 (ref 5–15)
BUN: 36 mg/dL — AB (ref 6–20)
CHLORIDE: 102 mmol/L (ref 101–111)
CO2: 29 mmol/L (ref 22–32)
Calcium: 9.2 mg/dL (ref 8.9–10.3)
Creatinine, Ser: 1.95 mg/dL — ABNORMAL HIGH (ref 0.61–1.24)
GFR calc Af Amer: 36 mL/min — ABNORMAL LOW (ref >60)
GFR calc non Af Amer: 31 mL/min — ABNORMAL LOW (ref >60)
Glucose, Bld: 196 mg/dL — ABNORMAL HIGH (ref 65–99)
Potassium: 3.8 mmol/L (ref 3.5–5.1)
SODIUM: 141 mmol/L (ref 135–145)

## 2015-02-07 LAB — CBC
HCT: 32.9 % — ABNORMAL LOW (ref 39.0–52.0)
HEMOGLOBIN: 10.8 g/dL — AB (ref 13.0–17.0)
MCH: 29.7 pg (ref 26.0–34.0)
MCHC: 32.8 g/dL (ref 30.0–36.0)
MCV: 90.4 fL (ref 78.0–100.0)
PLATELETS: 159 10*3/uL (ref 150–400)
RBC: 3.64 MIL/uL — ABNORMAL LOW (ref 4.22–5.81)
RDW: 13.1 % (ref 11.5–15.5)
WBC: 11.5 10*3/uL — ABNORMAL HIGH (ref 4.0–10.5)

## 2015-02-07 LAB — GLUCOSE, CAPILLARY
GLUCOSE-CAPILLARY: 188 mg/dL — AB (ref 65–99)
GLUCOSE-CAPILLARY: 189 mg/dL — AB (ref 65–99)
GLUCOSE-CAPILLARY: 203 mg/dL — AB (ref 65–99)
Glucose-Capillary: 202 mg/dL — ABNORMAL HIGH (ref 65–99)
Glucose-Capillary: 169 mg/dL — ABNORMAL HIGH (ref 65–99)
Glucose-Capillary: 185 mg/dL — ABNORMAL HIGH (ref 65–99)

## 2015-02-07 LAB — MAGNESIUM: MAGNESIUM: 1.7 mg/dL (ref 1.7–2.4)

## 2015-02-07 LAB — HEPARIN LEVEL (UNFRACTIONATED)
HEPARIN UNFRACTIONATED: 0.54 [IU]/mL (ref 0.30–0.70)
Heparin Unfractionated: 0.37 IU/mL (ref 0.30–0.70)

## 2015-02-07 LAB — URINE MICROSCOPIC-ADD ON

## 2015-02-07 LAB — TSH: TSH: 1.364 u[IU]/mL (ref 0.350–4.500)

## 2015-02-07 LAB — MRSA PCR SCREENING: MRSA by PCR: NEGATIVE

## 2015-02-07 MED ORDER — INSULIN ASPART 100 UNIT/ML ~~LOC~~ SOLN
0.0000 [IU] | Freq: Every day | SUBCUTANEOUS | Status: DC
  Administered 2015-02-07 – 2015-02-10 (×2): 2 [IU] via SUBCUTANEOUS

## 2015-02-07 MED ORDER — ALUM & MAG HYDROXIDE-SIMETH 200-200-20 MG/5ML PO SUSP
30.0000 mL | Freq: Four times a day (QID) | ORAL | Status: DC | PRN
  Administered 2015-02-10 – 2015-02-16 (×7): 30 mL via ORAL
  Filled 2015-02-07 (×7): qty 30

## 2015-02-07 MED ORDER — ALUM & MAG HYDROXIDE-SIMETH 200-200-20 MG/5ML PO SUSP
30.0000 mL | Freq: Once | ORAL | Status: AC
  Administered 2015-02-07: 30 mL via ORAL
  Filled 2015-02-07: qty 30

## 2015-02-07 MED ORDER — GABAPENTIN 100 MG PO CAPS
100.0000 mg | ORAL_CAPSULE | Freq: Three times a day (TID) | ORAL | Status: DC
  Administered 2015-02-07 – 2015-02-10 (×10): 100 mg via ORAL
  Filled 2015-02-07 (×11): qty 1

## 2015-02-07 MED ORDER — PANTOPRAZOLE SODIUM 40 MG PO TBEC
40.0000 mg | DELAYED_RELEASE_TABLET | Freq: Two times a day (BID) | ORAL | Status: DC
  Administered 2015-02-07 – 2015-02-10 (×6): 40 mg via ORAL
  Filled 2015-02-07 (×6): qty 1

## 2015-02-07 MED ORDER — CLONIDINE HCL 0.2 MG PO TABS
0.2000 mg | ORAL_TABLET | Freq: Two times a day (BID) | ORAL | Status: DC
  Administered 2015-02-07 – 2015-02-10 (×7): 0.2 mg via ORAL
  Filled 2015-02-07 (×8): qty 1

## 2015-02-07 MED ORDER — INSULIN DETEMIR 100 UNIT/ML ~~LOC~~ SOLN
36.0000 [IU] | Freq: Every day | SUBCUTANEOUS | Status: DC
Start: 2015-02-07 — End: 2015-02-17
  Administered 2015-02-07 – 2015-02-15 (×8): 36 [IU] via SUBCUTANEOUS
  Administered 2015-02-16: 18 [IU] via SUBCUTANEOUS
  Filled 2015-02-07 (×12): qty 0.36

## 2015-02-07 MED ORDER — HEPARIN BOLUS VIA INFUSION
2500.0000 [IU] | Freq: Once | INTRAVENOUS | Status: AC
  Administered 2015-02-07: 2500 [IU] via INTRAVENOUS
  Filled 2015-02-07: qty 2500

## 2015-02-07 MED ORDER — INSULIN ASPART 100 UNIT/ML ~~LOC~~ SOLN
0.0000 [IU] | Freq: Three times a day (TID) | SUBCUTANEOUS | Status: DC
  Administered 2015-02-07 – 2015-02-09 (×4): 3 [IU] via SUBCUTANEOUS
  Administered 2015-02-09: 5 [IU] via SUBCUTANEOUS
  Administered 2015-02-10: 3 [IU] via SUBCUTANEOUS
  Administered 2015-02-11 (×2): 5 [IU] via SUBCUTANEOUS

## 2015-02-07 NOTE — Progress Notes (Signed)
Patient had elevated troponin from 0.07 to 1.37, Kathline Magic notified. Patient began complaining of burning in throat and down chest. Kathline Magic in to see patient , EKG obtained, BP slightly elevated to [id number] systolic. Patient denies chest pain, order from Kathline Magic to give patient maalox 30cc now, will continue to monitor patient. No order to increase NTG at this time. [facility] [employer]

## 2015-02-07 NOTE — Progress Notes (Signed)
  Echocardiogram 2D Echocardiogram has been performed.  Zachary Mcmahon 02/07/2015, 2:40 PM

## 2015-02-07 NOTE — Progress Notes (Signed)
[facility] PROGRESS NOTE  Zachary Mcmahon [id number] DOB: [birth year] DOA: 02/06/2015 PCP: [name], MD Brief narrative 81 year old male with history of hypertension, diabetes mellitus, CAD status post MI, abnormal nuclear stress test in 2013 without intervention for unclear lesion,. GERD, dyslipidemia , chronic kidney disease who presented to the ED with acute onset of substernal chest pain with EKG changes and elevated troponin suggestive of an ST EMI. Patient also reported significant acid reflux symptoms for the past few months. He was also found to have uncontrolled hypertension in the ED.  Patient presented to [facility] ED and was started on heparin and nitroglycerin drip. Since they did not have cardiologist over the weekend patient was transferred to a stepdown unit bed at [facility] with cardiology evaluation.   Assessment/Plan: NSTEMI On IV heparin and nitroglycerin drip. No further chest pain symptoms. Will titrate nitroglycerin drip to discontinue. History of CAD with MI in the past. Also had positive nuclear stress is in 2013 with anterior wall ischemia and EF of 45%, but did not have any intervention. -Serial cardiac enzymes elevated. Seen by cardiology with plan on cardiac cath on 6/20. -Continue daily aspirin and Lipitor. Continue twice a day labetalol 4 blood pressure control.  Acute on chronic kidney disease stage II Likely prerenal with recent diarrhea. Improving with IV fluids. Avoid nephrotoxic  -GERD Will place on  PPI twice a day and Maalox  Diarrhea with abdominal pain Currently resolved. Concern for ileus on abdominal x-ray. Abdominal exam is benign. Lactic acid unremarkable. Stool for C. difficile negative. Continue to treat for GERD  Type 2 diabetes mellitus Will resume home dose Lantus and monitor on sliding scale insulin.   Dyslipidemia Continue statin.  Essential hypertension On labetalol twice a day. I would resume his home  dose clonidine. Holding Lasix and ACE inhibitor due to acute on chronic kidney injury.        Code Status: Full code Family Communication: Sister at bedside Disposition Plan: Continue inpatient monitoring. Cardiac cath on 6/20.   Consultants:  Cardiology  Procedures:  2-D echo (pending)  Antibiotics:  None  HPI/Subjective: Patient seen and examined. Denies any further chest pain. Denies any abdominal pain besides mild distention. No further diarrhea.  Objective: Filed Vitals:   02/07/15 1315  BP: 148/59  Pulse: 74  Temp:   Resp: 16    Intake/Output Summary (Last 24 hours) at 02/07/15 1458 Last data filed at 02/07/15 0548  Gross per 24 hour  Intake 687.63 ml  Output    300 ml  Net 387.63 ml   Filed Weights   02/06/15 1549  Weight: 92.987 kg (205 lb)    Exam:   General:  Elderly male in no acute distress  HEENT: No pallor, moist oral mucosa, no JVD  Chest: Clear to auscultation bilaterally, no added sounds  CVS: Normal S1 and S2, no murmurs rub or gallop  GI: Soft, mildly distended, nontender, bowel sounds present  Musculoskeletal: Warm, no edema  CNS: Alert and oriented    Data Reviewed: Basic Metabolic Panel:  Recent Labs Lab 02/06/15 1611 02/07/15 0047 02/07/15 0533  NA 142  --  141  K 4.4  --  3.8  CL 101  --  102  CO2 30  --  29  GLUCOSE 220*  --  196*  BUN 41*  --  36*  CREATININE 2.26*  --  1.95*  CALCIUM 9.4  --  9.2  MG  --  1.7  --  Liver Function Tests:  Recent Labs Lab 02/06/15 1611  AST 13*  ALT 10*  ALKPHOS 91  BILITOT 0.9  PROT 7.1  ALBUMIN 4.0    Recent Labs Lab 02/06/15 1835  LIPASE 49   No results for input(s): AMMONIA in the last 168 hours. CBC:  Recent Labs Lab 02/06/15 1612 02/07/15 0533  WBC 9.5 11.5*  NEUTROABS 7.3  --   HGB 12.1* 10.8*  HCT 37.5* 32.9*  MCV 92.8 90.4  PLT 172 159   Cardiac Enzymes:  Recent Labs Lab 02/06/15 1611 02/06/15 1835 02/07/15 0047 02/07/15 0533  02/07/15 1204  TROPONINI <0.03 0.07* 1.37* 2.48* 3.03*   BNP (last 3 results)  Recent Labs  02/06/15 1612  BNP 279.0*    ProBNP (last 3 results) No results for input(s): PROBNP in the last 8760 hours.  CBG:  Recent Labs Lab 02/07/15 0058 02/07/15 0440 02/07/15 0744 02/07/15 1206  GLUCAP 189* 202* 169* 188*    Recent Results (from the past 240 hour(s))  MRSA PCR Screening     Status: None   Collection Time: 02/06/15 11:45 PM  Result Value Ref Range Status   MRSA by PCR NEGATIVE NEGATIVE Final    Comment:        The GeneXpert MRSA Assay (FDA approved for NASAL specimens only), is one component of a comprehensive MRSA colonization surveillance program. It is not intended to diagnose MRSA infection nor to guide or monitor treatment for MRSA infections.      Studies: Dg Chest Portable 1 View  02/06/2015   CLINICAL DATA:  Chest pain radiating to both arms beginning today  EXAM: PORTABLE CHEST - 1 VIEW  COMPARISON:  03/20/2012  FINDINGS: Mild enlargement of the cardiomediastinal silhouette with central vascular congestion reidentified. A few interstitial Kerley B-lines are noted at the lung bases which could indicate interstitial edema. No focal pulmonary opacity allowing for technique. No pleural effusion. No acute osseous finding.  IMPRESSION: Mild enlargement of the cardiomediastinal silhouette with suggestion of early interstitial edema. If symptoms persist, consider PA and lateral chest radiographs obtained at full inspiration when the patient is clinically able.   Electronically Signed   By: Conchita Paris M.D.   On: 02/06/2015 16:45   Dg Abd 2 Views  02/06/2015   CLINICAL DATA:  81 year old male with generalized abdominal pain and chest pain radiating to the arms since 1330 hrs. Initial encounter.  EXAM: ABDOMEN - 2 VIEW  COMPARISON:  Portable chest radiograph 1620 hr today. Lumbar MRI 10/21/2009.  FINDINGS: Upright and supine views of the abdomen and pelvis.  Moderate gaseous distension of the stomach. Mild elevation of the left hemidiaphragm. No pneumoperitoneum.  Gas-filled but nondilated small and large bowel loops throughout the abdomen and pelvis. Non obstructed pattern. Osteopenia. Postoperative changes to the proximal right femur. No definite acute osseous abnormality.  IMPRESSION: 1. Gaseous distension of the stomach, and gas throughout nondilated small and large bowel loops. The appearance might reflect ileus but does not suggest a mechanical bowel obstruction. 2. No free air.   Electronically Signed   By: Genevie Ann M.D.   On: 02/06/2015 19:52    Scheduled Meds: . aspirin EC  81 mg Oral Daily  . atorvastatin  20 mg Oral q1800  . insulin aspart  0-9 Units Subcutaneous 6 times per day  . labetalol  300 mg Oral BID   Continuous Infusions: . heparin 1,400 Units/hr (02/07/15 0819)  . nitroGLYCERIN 45 mcg/min (02/07/15 1157)      Time  spent: 35 minutes    Louellen Molder  [facility] Pager [phone] If 7PM-7AM, please contact night-coverage at [email or web address], password [facility] 02/07/2015, 2:58 PM  LOS: 1 day

## 2015-02-07 NOTE — Progress Notes (Signed)
Kathline Magic NP returned call to increase patients NTG back to 30mcg. Patient states the maalox has almost completely took all the buring and discomfort away. Will continue to monitor. [facility] [employer]

## 2015-02-07 NOTE — Progress Notes (Signed)
ANTICOAGULATION CONSULT NOTE - Follow-up Consult  Pharmacy Consult for Heparin Indication: chest pain/ACS  Allergies  Allergen Reactions  . Bactrim [Sulfamethoxazole-Trimethoprim] Rash   Patient Measurements: Height: 5\' 9"  (175.3 cm) Weight: 205 lb (92.987 kg) IBW/kg (Calculated) : 70.7 HEPARIN DW (KG): 90.8  Vital Signs: Temp: 98 F (36.7 C) (06/18 0438) Temp Source: Oral (06/18 0438) BP: 151/61 mmHg (06/18 1015) Pulse Rate: 75 (06/18 1015)  Labs:  Recent Labs  02/06/15 1611 02/06/15 1612 02/06/15 1835 02/06/15 2147 02/07/15 0047 02/07/15 0533 02/07/15 0936  HGB  --  12.1*  --   --   --  10.8*  --   HCT  --  37.5*  --   --   --  32.9*  --   PLT  --  172  --   --   --  159  --   APTT  --  39*  --   --   --   --   --   LABPROT  --  14.9  --   --   --   --   --   INR  --  1.16  --   --   --   --   --   HEPARINUNFRC  --   --   --  0.11*  --   --  0.54  CREATININE 2.26*  --   --   --   --  1.95*  --   TROPONINI <0.03  --  0.07*  --  1.37* 2.48*  --    Estimated Creatinine Clearance: 34 mL/min (by C-G formula based on Cr of 1.95).  Assessment: 81yo male on heparin for r/o ACS. Trop up to 2.48. Heparin level therapeutic at 0.54 on 1400 units/hr. No issues with line or bleeding reported. H/H down, Plt wnl. No s/s of bleeding reported    Goal of Therapy:  Heparin level 0.3-0.7 units/ml Monitor platelets by anticoagulation protocol: Yes   Plan:   Continue heparin infusion at 1400 units/hr  Heparin level in 8 hrs  CBC and heparin level daily   Albertina Parr, PharmD., BCPS Clinical Pharmacist Pager [phone]

## 2015-02-07 NOTE — Plan of Care (Signed)
Problem: Phase I Progression Outcomes Goal: Voiding-avoid urinary catheter unless indicated Outcome: Completed/Met Date Met:  02/07/15 Pt has a condom cath placed

## 2015-02-07 NOTE — Consult Note (Signed)
CARDIOLOGY CONSULT NOTE  Assessment and Plan:  *Non-ST elevation myocardial infarction:  Zachary Mcmahon is an 81 year old male with history of presumed CAD status post MI, hypertension, diabetes, dyslipidemia, gastroesophageal reflux disease comes to the emergency department with acute onset of substernal chest burning with EKG changes and elevated troponin suggestive of non-ST elevation myocardial infarction. Patient endorses significant acid reflux symptoms that have been bothering him for past few months. He states that usually his symptoms resolved with Maalox and baking soda water. Yesterday symptoms also improved after using baking soda but not as well. In addition, patient was noted to have severely elevated blood pressures to [id number] systolic. Certainly a plaque rupture event is on the differential but type II in STEMI from elevated blood pressures complicated by severe GERD is a possibility as well.  Unfortunately, patient has chronic kidney disease stage IV.  I discussed my concern about the possibility of dialysis with invasive approach such as left heart catheterization and coronary angiography. Patient is unsure if he wants [place] at this time as he is convinced that this is his acid reflux symptoms.   Meanwhile, we recommend: --Aspirin 81 mg daily --Heparin ACS nomogram --Atorvastatin 40 mg daily --Control blood pressures with systolic: Less than 1 40 mmHg. --Continue labetalol 300 mg twice a day. --Obtain a transthoracic echocardiogram in the morning --We'll discuss the patient about the risk benefit of catheterization again in the morning before considering invasive versus noninvasive strategy.  Thank you very much for this very interesting consult. We'll continue to follow along. Please do not hesitate to call the on-call cardiology pager with any questions.  Chief complaint: Chest pain  HPI:  Zachary Mcmahon is an 81 year old male with history of presumed CAD, hypertension,  diabetes, dyslipidemia comes to the emergency department from [facility] concern for non-ST elevation myocardial infarction. Patient states that he was doing fairly well until yesterday afternoon when 30 minutes after eating patient started noticing epigastric pain with substernal burning that appeared to be reaching his throat. He has had history of gastroesophageal reflux disease that has been intermittently bothering him over past few months. He felt that the symptoms were similar to previous GERD but a lot worse. He took Maalox with complete resolution of symptoms. In addition, he had a water with baking soda again seemed due to relieving his symptoms immediately. In the emergency department, patient was noted to have ST depressions in the lateral leads with troponins that rose from normal levels to 1.2 suggestive of non-ST elevation myocardial infarction. Patient was therefore transferred [facility] for further evaluation. Currently, patient denies any chest pain, pressure, GERD symptoms, diaphoresis, shortness of breath.  Of note, patient had a nuclear medicine stress test in 2013 which was abnormal in the anterior wall suggestive of ischemia and inferior inferolateral wall suggestive of scar. Echocardiogram at that point also showed LVEF of 45% with inferior wall hypokinesis. It's unclear why patient did not have a left heart catheterization at that time.  Cardiac history:   Previous cardiac imaging  EKG: Sinus rhythm, first-degree AV block, less than 1 mm ST depressions in the anterolateral leads  TTE: LVEF 45% with inferior hypokinesis, disproportionate septal hypertrophy, LVH  NM Stress test 2013: Mid to basal inferior inferolateral scar, mid anterior, apical septal and apical ischemia and scar  Prior cath:  None Past Medical History Past Medical History  Diagnosis Date  . Hypertension   . High potassium   . Varicose veins   .  Hyperlipidemia   . Basal cell  carcinoma of forehead 2016 X 2  . Basal cell carcinoma of left earlobe   . Old myocardial infarct     "sometime in the past; don't know when" (02/07/2015)  . Type II diabetes mellitus   . Pneumonia X 1  . GERD (gastroesophageal reflux disease)   . Arthritis     "legs" (02/07/2015)  . History of gout X 1  . Diabetic peripheral neuropathy     "left foot" (02/07/2015)    Allergies: Allergies  Allergen Reactions  . Bactrim [Sulfamethoxazole-Trimethoprim] Rash    Social History History   Social History  . Marital Status: Married    Spouse Name: N/A  . Number of Children: N/A  . Years of Education: N/A   Occupational History  . Not on file.   Social History Main Topics  . Smoking status: Never Smoker   . Smokeless tobacco: Never Used  . Alcohol Use: No  . Drug Use: No  . Sexual Activity: Not Currently   Other Topics Concern  . Not on file   Social History Narrative    Family History Family History  Problem Relation Age of Onset  . Deep vein thrombosis Mother   . Hypertension Mother   . Hypertension Sister   . Diabetes Brother   . Hyperlipidemia Brother   . Hypertension Brother   . Heart attack Brother     Physical Exam Filed Vitals:   02/07/15 0246  BP: 175/72  Pulse:   Temp:   Resp: 12     Gen: Comfortable appearing in no acute distress HEENT: Extraocular motions intact, pupils equal, moist mucous membranes Neck: Supple, no JVD CV: Regular rate rhythm, normal S1, normal S2, 2/6 systolic murmur best heard at the right upper sternal border, +2 pulses in bilateral upper extremities Pulm: Clear to auscultation bilaterally Abdomen: Soft, nontender, nondistended Ext: No cyanosis clubbing or edema  Labs:  Results for orders placed or performed during the hospital encounter of 02/06/15 (from the past 24 hour(s))  Comprehensive metabolic panel     Status: Abnormal   Collection Time: 02/06/15  4:11 PM  Result Value Ref Range   Sodium 142 135 - 145 mmol/L     Potassium 4.4 3.5 - 5.1 mmol/L   Chloride 101 101 - 111 mmol/L   CO2 30 22 - 32 mmol/L   Glucose, Bld 220 (H) 65 - 99 mg/dL   BUN 41 (H) 6 - 20 mg/dL   Creatinine, Ser 2.26 (H) 0.61 - 1.24 mg/dL   Calcium 9.4 8.9 - 10.3 mg/dL   Total Protein 7.1 6.5 - 8.1 g/dL   Albumin 4.0 3.5 - 5.0 g/dL   AST 13 (L) 15 - 41 U/L   ALT 10 (L) 17 - 63 U/L   Alkaline Phosphatase 91 38 - 126 U/L   Total Bilirubin 0.9 0.3 - 1.2 mg/dL   GFR calc non Af Amer 26 (L) >60 mL/min   GFR calc Af Amer 30 (L) >60 mL/min   Anion gap 11 5 - 15  Troponin I     Status: None   Collection Time: 02/06/15  4:11 PM  Result Value Ref Range   Troponin I <0.03 <0.031 ng/mL  Protime-INR     Status: None   Collection Time: 02/06/15  4:12 PM  Result Value Ref Range   Prothrombin Time 14.9 11.6 - 15.2 seconds   INR 1.16 0.00 - 1.49  APTT     Status: Abnormal  Collection Time: 02/06/15  4:12 PM  Result Value Ref Range   aPTT 39 (H) 24 - 37 seconds  CBC with Differential     Status: Abnormal   Collection Time: 02/06/15  4:12 PM  Result Value Ref Range   WBC 9.5 4.0 - 10.5 K/uL   RBC 4.04 (L) 4.22 - 5.81 MIL/uL   Hemoglobin 12.1 (L) 13.0 - 17.0 g/dL   HCT 37.5 (L) 39.0 - 52.0 %   MCV 92.8 78.0 - 100.0 fL   MCH 30.0 26.0 - 34.0 pg   MCHC 32.3 30.0 - 36.0 g/dL   RDW 13.3 11.5 - 15.5 %   Platelets 172 150 - 400 K/uL   Neutrophils Relative % 77 43 - 77 %   Neutro Abs 7.3 1.7 - 7.7 K/uL   Lymphocytes Relative 11 (L) 12 - 46 %   Lymphs Abs 1.0 0.7 - 4.0 K/uL   Monocytes Relative 8 3 - 12 %   Monocytes Absolute 0.8 0.1 - 1.0 K/uL   Eosinophils Relative 4 0 - 5 %   Eosinophils Absolute 0.4 0.0 - 0.7 K/uL   Basophils Relative 0 0 - 1 %   Basophils Absolute 0.0 0.0 - 0.1 K/uL  Brain natriuretic peptide     Status: Abnormal   Collection Time: 02/06/15  4:12 PM  Result Value Ref Range   B Natriuretic Peptide 279.0 (H) 0.0 - 100.0 pg/mL  Lipase, blood     Status: None   Collection Time: 02/06/15  6:35 PM  Result Value  Ref Range   Lipase 49 22 - 51 U/L  Lactic acid, plasma     Status: None   Collection Time: 02/06/15  6:35 PM  Result Value Ref Range   Lactic Acid, Venous 0.9 0.5 - 2.0 mmol/L  Troponin I     Status: Abnormal   Collection Time: 02/06/15  6:35 PM  Result Value Ref Range   Troponin I 0.07 (H) <0.031 ng/mL  Heparin level (unfractionated)     Status: Abnormal   Collection Time: 02/06/15  9:47 PM  Result Value Ref Range   Heparin Unfractionated 0.11 (L) 0.30 - 0.70 IU/mL  Lactic acid, plasma     Status: None   Collection Time: 02/06/15  9:47 PM  Result Value Ref Range   Lactic Acid, Venous 0.8 0.5 - 2.0 mmol/L  Troponin I     Status: Abnormal   Collection Time: 02/07/15 12:47 AM  Result Value Ref Range   Troponin I 1.37 (HH) <0.031 ng/mL  TSH     Status: None   Collection Time: 02/07/15 12:47 AM  Result Value Ref Range   TSH 1.364 0.350 - 4.500 uIU/mL  Magnesium     Status: None   Collection Time: 02/07/15 12:47 AM  Result Value Ref Range   Magnesium 1.7 1.7 - 2.4 mg/dL  Glucose, capillary     Status: Abnormal   Collection Time: 02/07/15 12:58 AM  Result Value Ref Range   Glucose-Capillary 189 (H) 65 - 99 mg/dL

## 2015-02-07 NOTE — Progress Notes (Signed)
ANTICOAGULATION CONSULT NOTE - Follow-up Consult  Pharmacy Consult for Heparin Indication: chest pain/ACS  Allergies  Allergen Reactions  . Bactrim [Sulfamethoxazole-Trimethoprim] Rash   Patient Measurements: Height: 5\' 9"  (175.3 cm) Weight: 205 lb (92.987 kg) IBW/kg (Calculated) : 70.7 HEPARIN DW (KG): 90.8  Vital Signs: Temp: 98 F (36.7 C) (06/18 1315) Temp Source: Oral (06/18 1315) BP: 151/58 mmHg (06/18 1659) Pulse Rate: 74 (06/18 1315)  Labs:  Recent Labs  02/06/15 1611 02/06/15 1612  02/06/15 2147 02/07/15 0047 02/07/15 0533 02/07/15 0936 02/07/15 1204 02/07/15 1801  HGB  --  12.1*  --   --   --  10.8*  --   --   --   HCT  --  37.5*  --   --   --  32.9*  --   --   --   PLT  --  172  --   --   --  159  --   --   --   APTT  --  39*  --   --   --   --   --   --   --   LABPROT  --  14.9  --   --   --   --   --   --   --   INR  --  1.16  --   --   --   --   --   --   --   HEPARINUNFRC  --   --   --  0.11*  --   --  0.54  --  0.37  CREATININE 2.26*  --   --   --   --  1.95*  --   --   --   TROPONINI <0.03  --   < >  --  1.37* 2.48*  --  3.03*  --   < > = values in this interval not displayed. Estimated Creatinine Clearance: 34 mL/min (by C-G formula based on Cr of 1.95).  Assessment: 81yo male on heparin for r/o ACS. Trop up to 2.48. Heparin level therapeutic at 0.54 on 1400 units/hr. No issues with line or bleeding reported. H/H down, Plt wnl. No s/s of bleeding reported    Confirmatory HL remains therapeutic at 0.37 on heparin 1400 units/hr. No issues with infusion or bleeding noted.  Goal of Therapy:  Heparin level 0.3-0.7 units/ml Monitor platelets by anticoagulation protocol: Yes   Plan:  Continue heparin infusion at 1400 units/hr CBC and heparin level daily  Monitor s/sx of bleeding  Andrey Cota. Diona Foley, PharmD Clinical Pharmacist Pager [phone]

## 2015-02-07 NOTE — Progress Notes (Signed)
[facility] hospitalist progress note. Chief complaint. Transfer note. History of present illness. This 81 year old male presented to [facility] with complaints of chest pain/abdominal pain. Patient admitted for atypical chest pain. EKG changes noted and prior history of abnormal stress test in 2013. The patient is now arrived in transfer and I'm seeing him at bedside to ensure he remains clinically stable and in his orders transferred here appropriately. Patient again complains of some chest pain. This with associated diaphoresis and radiation to both arms. Indicates that this feels similar to heartburn that he experiences frequently. He is nitroglycerin drip has been decreased from 45 to 30 g. Troponin level at [facility] 0.07. Current troponin has increased to 1.37. Physical exam. Vital signs. Temperature 98.7, pulse 79, respiration 12, blood pressure 175/72. O2 sats 99%. General appearance. Well-developed elderly male who is alert and in no distress. Cardiac. Rate and rhythm regular. Lungs. Breath sounds clear. Abdomen. Soft with positive bowel sounds. Impression/plan. Problem #1. Chest pain. I will increase the nitroglycerin drip to 45 g. I discussed the case with cardiology and they will see the patient in consult this morning. I will also provide the patient some Maalox for possible reflux pain. Patient continues on IV heparin and nitroglycerin drip. Also beta blocker and antilipid agent in place. We'll follow for any cardiology recommendations.

## 2015-02-07 NOTE — Progress Notes (Signed)
CRITICAL VALUE ALERT  Critical value received: troponin 1.37  Date of notification: 02/07/2015  Time of notification:  0220  Critical value read back:yes  Nurse who received alert: samantha cooper rn  MD notified (1st page): T.Callahan NP  Time of first page: [phone]  MD notified (2nd page):  Time of second page:  Responding MD:  Kathline Magic NP  Time MD responded: [phone]

## 2015-02-07 NOTE — Progress Notes (Signed)
followup noted from 3 am   Hx:::CAD status post MI, hypertension, diabetes, dyslipidemia, gastroesophageal reflux disease comes to the emergency department with acute onset of substernal chest burning with EKG changes and elevated troponin suggestive of non-ST elevation myocardial infarction. Patient endorses significant acid reflux symptoms that have been bothering him for past few months. He states that usually his symptoms resolved with Maalox and baking soda water. Yesterday symptoms also improved after using baking soda but not as well. In addition, patient was noted to have severely elevated blood pressures to 123XX123 systolic. Certainly a plaque rupture event is on the differential but type II in STEMI from elevated blood pressures complicated by severe GERD is a possibility as well.  >>patient had a nuclear medicine stress test in 2013 which was abnormal in the anterior wall suggestive of ischemia and inferior inferolateral wall suggestive of scar. Echocardiogram at that point also showed LVEF of 45% with inferior wall hypokinesis. It's unclear why patient did not have a left heart catheterization at that time.  Heparin started labetalol continued.  BP 138/66 mmHg  Pulse 76  Temp(Src) 98 F (36.7 C) (Oral)  Resp 13  Ht 5\' 9"  (1.753 m)  Wt 205 lb (92.987 kg)  BMI 30.26 kg/m2  SpO2 100%         Patient Name: Zachary Mcmahon      SUBJECTIVE:*  No chest pain overnight.  Past Medical History  Diagnosis Date  . Hypertension   . High potassium   . Varicose veins   . Hyperlipidemia   . Basal cell carcinoma of forehead 2016 X 2  . Basal cell carcinoma of left earlobe   . Old myocardial infarct     "sometime in the past; don't know when" (02/07/2015)  . Type II diabetes mellitus   . Pneumonia X 1  . GERD (gastroesophageal reflux disease)   . Arthritis     "legs" (02/07/2015)  . History of gout X 1  . Diabetic peripheral neuropathy     "left foot" (02/07/2015)    Scheduled  Meds:  Scheduled Meds: . aspirin EC  81 mg Oral Daily  . atorvastatin  20 mg Oral q1800  . insulin aspart  0-9 Units Subcutaneous 6 times per day  . labetalol  300 mg Oral BID   Continuous Infusions: . sodium chloride 75 mL/hr (02/07/15 0108)  . heparin 1,400 Units/hr (02/07/15 0819)  . nitroGLYCERIN 45 mcg/min (02/07/15 0310)   acetaminophen, ondansetron (ZOFRAN) IV    PHYSICAL EXAM Filed Vitals:   02/07/15 0438 02/07/15 0445 02/07/15 0515 02/07/15 0545  BP: 144/56 137/54 146/53 138/66  Pulse: 76     Temp: 98 F (36.7 C)     TempSrc: Oral     Resp: 16 14 14 13   Height: 5\' 9"  (1.753 m)     Weight:      SpO2: 100%       Well developed and nourished in no acute distress HENT normal Neck supple with JVP-flat Clear Regular rate and rhythm, no murmurs or gallops Abd-soft with active BS No Clubbing cyanosis left greater than right edema Skin-warm and dry A & Oriented  Grossly normal sensory and motor function   TELEMETRY: Reviewed telemetry pt in NSR   Intake/Output Summary (Last 24 hours) at 02/07/15 0933 Last data filed at 02/07/15 0548  Gross per 24 hour  Intake 687.63 ml  Output    300 ml  Net 387.63 ml    LABS: Basic Metabolic Panel:  Recent Labs  Lab 02/06/15 1611 02/07/15 0047 02/07/15 0533  NA 142  --  141  K 4.4  --  3.8  CL 101  --  102  CO2 30  --  29  GLUCOSE 220*  --  196*  BUN 41*  --  36*  CREATININE 2.26*  --  1.95*  CALCIUM 9.4  --  9.2  MG  --  1.7  --    Cardiac Enzymes:  Recent Labs  02/06/15 1835 02/07/15 0047 02/07/15 0533  TROPONINI 0.07* 1.37* 2.48*   CBC:  Recent Labs Lab 02/06/15 1612 02/07/15 0533  WBC 9.5 11.5*  NEUTROABS 7.3  --   HGB 12.1* 10.8*  HCT 37.5* 32.9*  MCV 92.8 90.4  PLT 172 159   PROTIME:  Recent Labs  02/06/15 1612  LABPROT 14.9  INR 1.16   Liver Function Tests:  Recent Labs  02/06/15 1611  AST 13*  ALT 10*  ALKPHOS 91  BILITOT 0.9  PROT 7.1  ALBUMIN 4.0    Recent  Labs  02/06/15 1835  LIPASE 49   BNP: BNP (last 3 results)  Recent Labs  02/06/15 1612  BNP 279.0*     Recent Labs  02/07/15 0533  CHOL 83  HDL 17*  LDLCALC 48  TRIG 91  CHOLHDL 4.9   Thyroid Function Tests:  Recent Labs  02/07/15 0047  TSH 1.364       ASSESSMENT AND PLAN:  Principal Problem:   Chest pain Active Problems:   HTN (hypertension), malignant   Hyperlipidemia   Coronary artery disease   CKD (chronic kidney disease) stage 3, GFR 30-59 ml/min   Abdominal pain, acute, generalized   Diarrhea   Swelling of extremity, left, chronic   Ileus  Renal function is improving. We'll continue hydration. His antecedent diarrhea likely is contributing to his prerenal azotemia that is improving.   Based on previous noninvasive imaging this is high risk coronary anatomy.  I have reviewed this extensively with the patient and his sister.     Signed, Virl Axe MD  02/07/2015

## 2015-02-08 ENCOUNTER — Inpatient Hospital Stay (HOSPITAL_COMMUNITY): Payer: Medicare Other

## 2015-02-08 DIAGNOSIS — N179 Acute kidney failure, unspecified: Secondary | ICD-10-CM | POA: Insufficient documentation

## 2015-02-08 DIAGNOSIS — R140 Abdominal distension (gaseous): Secondary | ICD-10-CM | POA: Insufficient documentation

## 2015-02-08 DIAGNOSIS — I214 Non-ST elevation (NSTEMI) myocardial infarction: Secondary | ICD-10-CM | POA: Insufficient documentation

## 2015-02-08 DIAGNOSIS — N183 Chronic kidney disease, stage 3 (moderate): Secondary | ICD-10-CM

## 2015-02-08 LAB — CBC
HCT: 31.4 % — ABNORMAL LOW (ref 39.0–52.0)
Hemoglobin: 10.2 g/dL — ABNORMAL LOW (ref 13.0–17.0)
MCH: 29.7 pg (ref 26.0–34.0)
MCHC: 32.5 g/dL (ref 30.0–36.0)
MCV: 91.5 fL (ref 78.0–100.0)
Platelets: 155 10*3/uL (ref 150–400)
RBC: 3.43 MIL/uL — ABNORMAL LOW (ref 4.22–5.81)
RDW: 13.2 % (ref 11.5–15.5)
WBC: 10 10*3/uL (ref 4.0–10.5)

## 2015-02-08 LAB — GLUCOSE, CAPILLARY
GLUCOSE-CAPILLARY: 110 mg/dL — AB (ref 65–99)
Glucose-Capillary: 188 mg/dL — ABNORMAL HIGH (ref 65–99)
Glucose-Capillary: 182 mg/dL — ABNORMAL HIGH (ref 65–99)
Glucose-Capillary: 183 mg/dL — ABNORMAL HIGH (ref 65–99)

## 2015-02-08 LAB — BASIC METABOLIC PANEL
Anion gap: 7 (ref 5–15)
BUN: 28 mg/dL — ABNORMAL HIGH (ref 6–20)
CALCIUM: 9.5 mg/dL (ref 8.9–10.3)
CO2: 26 mmol/L (ref 22–32)
Chloride: 105 mmol/L (ref 101–111)
Creatinine, Ser: 1.78 mg/dL — ABNORMAL HIGH (ref 0.61–1.24)
GFR calc Af Amer: 40 mL/min — ABNORMAL LOW (ref >60)
GFR, EST NON AFRICAN AMERICAN: 34 mL/min — AB (ref >60)
Glucose, Bld: 170 mg/dL — ABNORMAL HIGH (ref 65–99)
Potassium: 3.9 mmol/L (ref 3.5–5.1)
Sodium: 138 mmol/L (ref 135–145)

## 2015-02-08 LAB — HEPARIN LEVEL (UNFRACTIONATED): HEPARIN UNFRACTIONATED: 0.31 [IU]/mL (ref 0.30–0.70)

## 2015-02-08 MED ORDER — SODIUM CHLORIDE 0.9 % IV SOLN: 250.0000 mL | INTRAVENOUS | Status: DC | PRN

## 2015-02-08 MED ORDER — SODIUM CHLORIDE 0.9 % IJ SOLN: 3.0000 mL | INTRAMUSCULAR | Status: DC | PRN

## 2015-02-08 MED ORDER — ASPIRIN 81 MG PO CHEW
81.0000 mg | CHEWABLE_TABLET | ORAL | Status: AC
  Administered 2015-02-09: 81 mg via ORAL
  Filled 2015-02-08: qty 1

## 2015-02-08 MED ORDER — SODIUM CHLORIDE 0.9 % IV SOLN
INTRAVENOUS | Status: DC
  Administered 2015-02-09: 05:00:00 via INTRAVENOUS

## 2015-02-08 MED ORDER — SODIUM CHLORIDE 0.9 % IJ SOLN: 3.0000 mL | Freq: Two times a day (BID) | INTRAMUSCULAR | Status: DC

## 2015-02-08 NOTE — Progress Notes (Signed)
Patient Name: Zachary Mcmahon      SUBJECTIVE:*    Hx:::CAD status post MI, hypertension, diabetes, dyslipidemia, gastroesophageal reflux disease comes to the emergency department with acute onset of substernal chest burning with EKG changes and elevated troponin suggestive of non-ST elevation myocardial infarction. Patient endorses significant acid reflux symptoms that have been bothering him for past few months. He states that usually his symptoms resolved with Maalox and baking soda water. Yesterday symptoms also improved after using baking soda but not as well. In addition, patient was noted to have severely elevated blood pressures to 123XX123 systolic. Certainly a plaque rupture event is on the differential but type II in STEMI from elevated blood pressures complicated by severe GERD is a possibility as well.  >>patient had a nuclear medicine stress test in 2013 which was abnormal in the anterior wall suggestive of ischemia and inferior inferolateral wall suggestive of scar. Echocardiogram at that point also showed LVEF of 45% with inferior wall hypokinesis. It's unclear why patient did not have a left heart catheterization at that time.  No chest pain overnight.  Past Medical History  Diagnosis Date  . Hypertension   . High potassium   . Varicose veins   . Hyperlipidemia   . Basal cell carcinoma of forehead 2016 X 2  . Basal cell carcinoma of left earlobe   . Old myocardial infarct     "sometime in the past; don't know when" (02/07/2015)  . Type II diabetes mellitus   . Pneumonia X 1  . GERD (gastroesophageal reflux disease)   . Arthritis     "legs" (02/07/2015)  . History of gout X 1  . Diabetic peripheral neuropathy     "left foot" (02/07/2015)    Scheduled Meds:  Scheduled Meds: . aspirin EC  81 mg Oral Daily  . atorvastatin  20 mg Oral q1800  . cloNIDine  0.2 mg Oral BID  . gabapentin  100 mg Oral TID  . insulin aspart  0-15 Units Subcutaneous TID WC  .  insulin aspart  0-5 Units Subcutaneous QHS  . insulin detemir  36 Units Subcutaneous QHS  . labetalol  300 mg Oral BID  . pantoprazole  40 mg Oral BID   Continuous Infusions: . heparin 1,400 Units/hr (02/08/15 0215)  . nitroGLYCERIN 45 mcg/min (02/08/15 0215)   acetaminophen, alum & mag hydroxide-simeth, ondansetron (ZOFRAN) IV    PHYSICAL EXAM Filed Vitals:   02/08/15 0007 02/08/15 0400 02/08/15 0639 02/08/15 0738  BP: 117/56 122/53 125/94 133/59  Pulse: 70 61 71 80  Temp: 98.5 F (36.9 C) 98.1 F (36.7 C)    TempSrc:      Resp: 15 12 16 19   Height:      Weight:  202 lb (91.627 kg)    SpO2: 95% 96% 96% 97%    Well developed and nourished in no acute distress HENT normal Neck supple with JVP-flat Clear Regular rate and rhythm, no murmurs or gallops Abd-soft with active BS No Clubbing cyanosis left greater than right edema Skin-warm and dry A & Oriented  Grossly normal sensory and motor function   TELEMETRY: Reviewed telemetry pt in NSR   Intake/Output Summary (Last 24 hours) at 02/08/15 0847 Last data filed at 02/08/15 0600  Gross per 24 hour  Intake 1428.22 ml  Output    900 ml  Net 528.22 ml    LABS: Basic Metabolic Panel:  Recent Labs Lab 02/06/15 1611 02/07/15 0047 02/07/15 0533  NA 142  --  141  K 4.4  --  3.8  CL 101  --  102  CO2 30  --  29  GLUCOSE 220*  --  196*  BUN 41*  --  36*  CREATININE 2.26*  --  1.95*  CALCIUM 9.4  --  9.2  MG  --  1.7  --    Cardiac Enzymes:  Recent Labs  02/07/15 0047 02/07/15 0533 02/07/15 1204  TROPONINI 1.37* 2.48* 3.03*   CBC:  Recent Labs Lab 02/06/15 1612 02/07/15 0533 02/08/15 0524  WBC 9.5 11.5* 10.0  NEUTROABS 7.3  --   --   HGB 12.1* 10.8* 10.2*  HCT 37.5* 32.9* 31.4*  MCV 92.8 90.4 91.5  PLT 172 159 155   PROTIME:  Recent Labs  02/06/15 1612  LABPROT 14.9  INR 1.16   Liver Function Tests:  Recent Labs  02/06/15 1611  AST 13*  ALT 10*  ALKPHOS 91  BILITOT 0.9  PROT  7.1  ALBUMIN 4.0    Recent Labs  02/06/15 1835  LIPASE 49   BNP: BNP (last 3 results)  Recent Labs  02/06/15 1612  BNP 279.0*     Recent Labs  02/07/15 0533  CHOL 83  HDL 17*  LDLCALC 48  TRIG 91  CHOLHDL 4.9   Thyroid Function Tests:  Recent Labs  02/07/15 0047  TSH 1.364       ASSESSMENT AND PLAN:  Principal Problem:   Chest pain Active Problems:   HTN (hypertension), malignant   Hyperlipidemia   Coronary artery disease   CKD (chronic kidney disease) stage 3, GFR 30-59 ml/min   Abdominal pain, acute, generalized   Diarrhea   Swelling of extremity, left, chronic   Ileus   Non-STEMI (non-ST elevated myocardial infarction)  Renal function is improving. We'll continue hydration.    For cath in am  Renal testing pendng  Signed, Virl Axe MD  02/08/2015

## 2015-02-08 NOTE — Progress Notes (Signed)
ANTICOAGULATION CONSULT NOTE - Follow-up Consult  Pharmacy Consult for Heparin Indication: chest pain/ACS  Allergies  Allergen Reactions  . Bactrim [Sulfamethoxazole-Trimethoprim] Rash   Patient Measurements: Height: 5\' 9"  (175.3 cm) Weight: 202 lb (91.627 kg) IBW/kg (Calculated) : 70.7 HEPARIN DW (KG): 90.8  Vital Signs: Temp: 98.1 F (36.7 C) (06/19 0400) BP: 125/94 mmHg (06/19 0639) Pulse Rate: 71 (06/19 0639)  Labs:  Recent Labs  02/06/15 1611  02/06/15 1612  02/07/15 0047 02/07/15 0533 02/07/15 0936 02/07/15 1204 02/07/15 1801 02/08/15 0524  HGB  --   < > 12.1*  --   --  10.8*  --   --   --  10.2*  HCT  --   --  37.5*  --   --  32.9*  --   --   --  31.4*  PLT  --   --  172  --   --  159  --   --   --  155  APTT  --   --  39*  --   --   --   --   --   --   --   LABPROT  --   --  14.9  --   --   --   --   --   --   --   INR  --   --  1.16  --   --   --   --   --   --   --   HEPARINUNFRC  --   --   --   < >  --   --  0.54  --  0.37 0.31  CREATININE 2.26*  --   --   --   --  1.95*  --   --   --   --   TROPONINI <0.03  --   --   < > 1.37* 2.48*  --  3.03*  --   --   < > = values in this interval not displayed. Estimated Creatinine Clearance: 33.8 mL/min (by C-G formula based on Cr of 1.95).  Assessment: 81yo male on heparin for r/o ACS. Trop up to 3.03. Heparin level remains therapeutic at 0.31 on 1400 units/hr. No issues with line or bleeding reported. H/H down, Plt wnl. No s/s of bleeding reported    Goal of Therapy:  Heparin level 0.3-0.7 units/ml Monitor platelets by anticoagulation protocol: Yes   Plan:  Increase heparin infusion to 1450 units/hr CBC and heparin level daily  Monitor s/sx of bleeding Cath on 6/20   Andrey Cota. Diona Foley, PharmD Clinical Pharmacist Pager [phone]

## 2015-02-08 NOTE — Progress Notes (Signed)
[facility] PROGRESS NOTE  Zachary Mcmahon [id number] DOB: [birth year] DOA: 02/06/2015 PCP: [name], MD Brief narrative 81 year old male with history of hypertension, diabetes mellitus, CAD status post MI, abnormal nuclear stress test in 2013 without intervention for unclear lesion,. GERD, dyslipidemia , chronic kidney disease who presented to the ED with acute onset of substernal chest pain with EKG changes and elevated troponin suggestive of an ST EMI. Patient also reported significant acid reflux symptoms for the past few months. He was also found to have uncontrolled hypertension in the ED.  Patient presented to [facility] ED and was started on heparin and nitroglycerin drip. Since they did not have cardiologist over the weekend patient was transferred to a stepdown unit bed at [facility] with cardiology evaluation.   Assessment/Plan: NSTEMI On IV heparin drip. D/ced Nitroglycerin drip. No further chest pain symptoms.History of CAD with MI in the past. Also had positive nuclear stress is in 2013 with anterior wall ischemia and EF of 45%, but did not have any intervention. 2-D echo repeated with EF of 45 -50% with akinesis of the mid apical inferior septal myocardium. Also shows grade 1 diastolic dysfunction. -Serial cardiac enzymes elevated. Seen by cardiology with plan on cardiac cath on 6/20. -Continue daily aspirin and Lipitor. Continue twice a day labetalol for  blood pressure control.  Acute on chronic kidney disease stage II Likely prerenal with recent diarrhea. Improving with IV fluids.  -GERD  Continue PPI twice a day and Maalox  Diarrhea with abdominal pain Currently resolved.  Stool for C. difficile negative. Abdominal x-ray negative for ileus and shows gaseous distention. (Improved) Continue to treat for GERD  Type 2 diabetes mellitus Continue home dose Lantus and monitor on sliding scale insulin.   Dyslipidemia Continue statin.  Essential  hypertension On labetalol twice a day.resumed his home dose clonidine. Holding Lasix and ACE inhibitor due to acute on chronic kidney injury.        Code Status: Full code Family Communication: none at bedside Disposition Plan: Continue inpatient monitoring. Cardiac cath in a.m.   Consultants:  Cardiology  Procedures:  2-D echo  Cath for 6/20  Antibiotics:  None  HPI/Subjective: Patient seen and examined. Chest pain. Still has some abdominal distention but improving. Passing gas but has not had a bowel movement. Monitor diet well  Objective: Filed Vitals:   02/08/15 0915  BP: 150/65  Pulse: 78  Temp:   Resp:     Intake/Output Summary (Last 24 hours) at 02/08/15 1215 Last data filed at 02/08/15 0913  Gross per 24 hour  Intake 1432.22 ml  Output    900 ml  Net 532.22 ml   Filed Weights   02/06/15 1549 02/08/15 0400  Weight: 92.987 kg (205 lb) 91.627 kg (202 lb)    Exam:   General:  Elderly male in no acute distress  HEENT: No pallor, moist oral mucosa, no JVD  Chest: Clear to auscultation bilaterally, no added sounds  CVS: Normal S1 and S2, no murmurs rub or gallop  GI: Soft, mildly distended, nontender, bowel sounds present  Musculoskeletal: Warm, no edema  CNS: Alert and oriented    Data Reviewed: Basic Metabolic Panel:  Recent Labs Lab 02/06/15 1611 02/07/15 0047 02/07/15 0533 02/08/15 0842  NA 142  --  141 138  K 4.4  --  3.8 3.9  CL 101  --  102 105  CO2 30  --  29 26  GLUCOSE 220*  --  196* 170*  BUN 41*  --  36* 28*  CREATININE 2.26*  --  1.95* 1.78*  CALCIUM 9.4  --  9.2 9.5  MG  --  1.7  --   --    Liver Function Tests:  Recent Labs Lab 02/06/15 1611  AST 13*  ALT 10*  ALKPHOS 91  BILITOT 0.9  PROT 7.1  ALBUMIN 4.0    Recent Labs Lab 02/06/15 1835  LIPASE 49   No results for input(s): AMMONIA in the last 168 hours. CBC:  Recent Labs Lab 02/06/15 1612 02/07/15 0533 02/08/15 0524  WBC 9.5 11.5*  10.0  NEUTROABS 7.3  --   --   HGB 12.1* 10.8* 10.2*  HCT 37.5* 32.9* 31.4*  MCV 92.8 90.4 91.5  PLT 172 159 155   Cardiac Enzymes:  Recent Labs Lab 02/06/15 1611 02/06/15 1835 02/07/15 0047 02/07/15 0533 02/07/15 1204  TROPONINI <0.03 0.07* 1.37* 2.48* 3.03*   BNP (last 3 results)  Recent Labs  02/06/15 1612  BNP 279.0*    ProBNP (last 3 results) No results for input(s): PROBNP in the last 8760 hours.  CBG:  Recent Labs Lab 02/07/15 1206 02/07/15 1712 02/07/15 2138 02/08/15 0755 02/08/15 1207  GLUCAP 188* 185* 203* 110* 188*    Recent Results (from the past 240 hour(s))  MRSA PCR Screening     Status: None   Collection Time: 02/06/15 11:45 PM  Result Value Ref Range Status   MRSA by PCR NEGATIVE NEGATIVE Final    Comment:        The GeneXpert MRSA Assay (FDA approved for NASAL specimens only), is one component of a comprehensive MRSA colonization surveillance program. It is not intended to diagnose MRSA infection nor to guide or monitor treatment for MRSA infections.      Studies: Dg Chest Portable 1 View  02/06/2015   CLINICAL DATA:  Chest pain radiating to both arms beginning today  EXAM: PORTABLE CHEST - 1 VIEW  COMPARISON:  03/20/2012  FINDINGS: Mild enlargement of the cardiomediastinal silhouette with central vascular congestion reidentified. A few interstitial Kerley B-lines are noted at the lung bases which could indicate interstitial edema. No focal pulmonary opacity allowing for technique. No pleural effusion. No acute osseous finding.  IMPRESSION: Mild enlargement of the cardiomediastinal silhouette with suggestion of early interstitial edema. If symptoms persist, consider PA and lateral chest radiographs obtained at full inspiration when the patient is clinically able.   Electronically Signed   By: Conchita Paris M.D.   On: 02/06/2015 16:45   Dg Abd 2 Views  02/06/2015   CLINICAL DATA:  81 year old male with generalized abdominal pain and  chest pain radiating to the arms since 1330 hrs. Initial encounter.  EXAM: ABDOMEN - 2 VIEW  COMPARISON:  Portable chest radiograph 1620 hr today. Lumbar MRI 10/21/2009.  FINDINGS: Upright and supine views of the abdomen and pelvis. Moderate gaseous distension of the stomach. Mild elevation of the left hemidiaphragm. No pneumoperitoneum.  Gas-filled but nondilated small and large bowel loops throughout the abdomen and pelvis. Non obstructed pattern. Osteopenia. Postoperative changes to the proximal right femur. No definite acute osseous abnormality.  IMPRESSION: 1. Gaseous distension of the stomach, and gas throughout nondilated small and large bowel loops. The appearance might reflect ileus but does not suggest a mechanical bowel obstruction. 2. No free air.   Electronically Signed   By: Genevie Ann M.D.   On: 02/06/2015 19:52   Dg Abd Portable 1v  02/08/2015   CLINICAL DATA:  Abdominal  distention.  EXAM: PORTABLE ABDOMEN - 1 VIEW  COMPARISON:  February 06, 2015.  FINDINGS: Mild gaseous distention of the stomach is again noted. Stool is noted in the right colon. No abnormal dilatation of large or small bowel is seen on this study. Stool is noted in the rectum. No abnormal calcifications are noted. Status post right hip surgery.  IMPRESSION: No evidence of bowel obstruction or ileus. Mild gastric distention of the stomach is again noted.   Electronically Signed   By: Marijo Conception, M.D.   On: 02/08/2015 10:49    Scheduled Meds: . aspirin EC  81 mg Oral Daily  . atorvastatin  20 mg Oral q1800  . cloNIDine  0.2 mg Oral BID  . gabapentin  100 mg Oral TID  . insulin aspart  0-15 Units Subcutaneous TID WC  . insulin aspart  0-5 Units Subcutaneous QHS  . insulin detemir  36 Units Subcutaneous QHS  . labetalol  300 mg Oral BID  . pantoprazole  40 mg Oral BID   Continuous Infusions: . heparin 1,450 Units/hr (02/08/15 0914)  . nitroGLYCERIN 45 mcg/min (02/08/15 0215)      Time spent: 25  minutes    Journey Ratterman, [facility] Pager [phone] If 7PM-7AM, please contact night-coverage at [email or web address], password [facility] 02/08/2015, 12:15 PM  LOS: 2 days

## 2015-02-09 ENCOUNTER — Encounter (HOSPITAL_COMMUNITY)
Admission: EM | Disposition: A | Discharge status: Skilled Nursing Facility | Payer: Medicare Other | Source: Home / Self Care | Attending: Cardiothoracic Surgery

## 2015-02-09 ENCOUNTER — Other Ambulatory Visit: Payer: Self-pay | Admitting: *Deleted

## 2015-02-09 ENCOUNTER — Encounter (HOSPITAL_COMMUNITY): Payer: Self-pay | Admitting: Cardiology

## 2015-02-09 ENCOUNTER — Inpatient Hospital Stay (HOSPITAL_COMMUNITY): Payer: Medicare Other

## 2015-02-09 DIAGNOSIS — I2510 Atherosclerotic heart disease of native coronary artery without angina pectoris: Secondary | ICD-10-CM

## 2015-02-09 HISTORY — PX: CARDIAC CATHETERIZATION: SHX172

## 2015-02-09 LAB — CBC
HCT: 32.3 % — ABNORMAL LOW (ref 39.0–52.0)
Hemoglobin: 10.4 g/dL — ABNORMAL LOW (ref 13.0–17.0)
MCH: 29.7 pg (ref 26.0–34.0)
MCHC: 32.2 g/dL (ref 30.0–36.0)
MCV: 92.3 fL (ref 78.0–100.0)
PLATELETS: 140 10*3/uL — AB (ref 150–400)
RBC: 3.5 MIL/uL — ABNORMAL LOW (ref 4.22–5.81)
RDW: 13.3 % (ref 11.5–15.5)
WBC: 10.5 10*3/uL (ref 4.0–10.5)

## 2015-02-09 LAB — BASIC METABOLIC PANEL
ANION GAP: 5 (ref 5–15)
BUN: 28 mg/dL — AB (ref 6–20)
CO2: 28 mmol/L (ref 22–32)
Calcium: 9.3 mg/dL (ref 8.9–10.3)
Chloride: 104 mmol/L (ref 101–111)
Creatinine, Ser: 1.88 mg/dL — ABNORMAL HIGH (ref 0.61–1.24)
GFR calc non Af Amer: 32 mL/min — ABNORMAL LOW (ref >60)
GFR, EST AFRICAN AMERICAN: 37 mL/min — AB (ref >60)
Glucose, Bld: 197 mg/dL — ABNORMAL HIGH (ref 65–99)
POTASSIUM: 4.1 mmol/L (ref 3.5–5.1)
SODIUM: 137 mmol/L (ref 135–145)

## 2015-02-09 LAB — HEPARIN LEVEL (UNFRACTIONATED): Heparin Unfractionated: 0.49 IU/mL (ref 0.30–0.70)

## 2015-02-09 LAB — PROTIME-INR
INR: 1.16 (ref 0.00–1.49)
Prothrombin Time: 15 seconds (ref 11.6–15.2)

## 2015-02-09 LAB — GLUCOSE, CAPILLARY
GLUCOSE-CAPILLARY: 168 mg/dL — AB (ref 65–99)
GLUCOSE-CAPILLARY: 203 mg/dL — AB (ref 65–99)
Glucose-Capillary: 130 mg/dL — ABNORMAL HIGH (ref 65–99)
Glucose-Capillary: 164 mg/dL — ABNORMAL HIGH (ref 65–99)

## 2015-02-09 LAB — HEMOGLOBIN A1C
Hgb A1c MFr Bld: 7.5 % — ABNORMAL HIGH (ref 4.8–5.6)
Mean Plasma Glucose: 169 mg/dL

## 2015-02-09 SURGERY — LEFT HEART CATH AND CORONARY ANGIOGRAPHY
Anesthesia: LOCAL

## 2015-02-09 MED ORDER — VERAPAMIL HCL 2.5 MG/ML IV SOLN
INTRAVENOUS | Status: DC | PRN
  Administered 2015-02-09: 10:00:00 via INTRA_ARTERIAL

## 2015-02-09 MED ORDER — SODIUM CHLORIDE 0.9 % IV SOLN
INTRAVENOUS | Status: AC
  Administered 2015-02-09: 11:00:00 via INTRAVENOUS

## 2015-02-09 MED ORDER — SODIUM CHLORIDE 0.9 % IV SOLN: 250.0000 mL | INTRAVENOUS | Status: DC | PRN

## 2015-02-09 MED ORDER — HEART ATTACK BOUNCING BOOK
Freq: Once | Status: AC
  Administered 2015-02-09: 08:00:00
  Filled 2015-02-09: qty 1

## 2015-02-09 MED ORDER — SODIUM CHLORIDE 0.9 % IJ SOLN: 3.0000 mL | INTRAMUSCULAR | Status: DC | PRN

## 2015-02-09 MED ORDER — NITROGLYCERIN 1 MG/10 ML FOR IR/CATH LAB
INTRA_ARTERIAL | Status: AC
  Filled 2015-02-09: qty 10

## 2015-02-09 MED ORDER — HEPARIN (PORCINE) IN NACL 2-0.9 UNIT/ML-% IJ SOLN
INTRAMUSCULAR | Status: AC
  Filled 2015-02-09: qty 1500

## 2015-02-09 MED ORDER — FENTANYL CITRATE (PF) 100 MCG/2ML IJ SOLN
INTRAMUSCULAR | Status: AC
  Filled 2015-02-09: qty 2

## 2015-02-09 MED ORDER — HEPARIN (PORCINE) IN NACL 100-0.45 UNIT/ML-% IJ SOLN
2100.0000 [IU]/h | INTRAMUSCULAR | Status: DC
  Administered 2015-02-09: 1450 [IU]/h via INTRAVENOUS
  Administered 2015-02-11: 1600 [IU]/h via INTRAVENOUS
  Administered 2015-02-12: 1900 [IU]/h via INTRAVENOUS
  Administered 2015-02-12 – 2015-02-15 (×8): 2100 [IU]/h via INTRAVENOUS
  Filled 2015-02-09 (×27): qty 250

## 2015-02-09 MED ORDER — VERAPAMIL HCL 2.5 MG/ML IV SOLN
INTRAVENOUS | Status: AC
  Filled 2015-02-09: qty 2

## 2015-02-09 MED ORDER — SODIUM CHLORIDE 0.9 % IJ SOLN
3.0000 mL | Freq: Two times a day (BID) | INTRAMUSCULAR | Status: DC
  Administered 2015-02-10 – 2015-02-16 (×9): 3 mL via INTRAVENOUS

## 2015-02-09 MED ORDER — MIDAZOLAM HCL 2 MG/2ML IJ SOLN
INTRAMUSCULAR | Status: DC | PRN
  Administered 2015-02-09: 1 mg via INTRAVENOUS

## 2015-02-09 MED ORDER — MIDAZOLAM HCL 2 MG/2ML IJ SOLN
INTRAMUSCULAR | Status: AC
  Filled 2015-02-09: qty 2

## 2015-02-09 MED ORDER — FENTANYL CITRATE (PF) 100 MCG/2ML IJ SOLN
INTRAMUSCULAR | Status: DC | PRN
  Administered 2015-02-09: 25 ug via INTRAVENOUS

## 2015-02-09 MED ORDER — HEPARIN SODIUM (PORCINE) 1000 UNIT/ML IJ SOLN
INTRAMUSCULAR | Status: DC | PRN
  Administered 2015-02-09: 5000 [IU] via INTRAVENOUS

## 2015-02-09 MED ORDER — NITROGLYCERIN 1 MG/10 ML FOR IR/CATH LAB
INTRA_ARTERIAL | Status: DC | PRN
  Administered 2015-02-09: 200 ug via INTRACORONARY

## 2015-02-09 MED ORDER — LIDOCAINE HCL (PF) 1 % IJ SOLN
INTRAMUSCULAR | Status: AC
  Filled 2015-02-09: qty 30

## 2015-02-09 MED ORDER — IOHEXOL 350 MG/ML SOLN
INTRAVENOUS | Status: DC | PRN
  Administered 2015-02-09: 60 mL via INTRAVENOUS

## 2015-02-09 MED ORDER — MORPHINE SULFATE 2 MG/ML IJ SOLN
2.0000 mg | INTRAMUSCULAR | Status: DC | PRN
Start: 2015-02-09 — End: 2015-02-17
  Administered 2015-02-10 – 2015-02-13 (×6): 2 mg via INTRAVENOUS
  Filled 2015-02-09 (×8): qty 1

## 2015-02-09 MED ORDER — HEPARIN SODIUM (PORCINE) 1000 UNIT/ML IJ SOLN
INTRAMUSCULAR | Status: AC
  Filled 2015-02-09: qty 1

## 2015-02-09 SURGICAL SUPPLY — 13 items
CATH INFINITI 5FR ANG PIGTAIL (CATHETERS) IMPLANT
CATH INFINITI 5FR MULTPACK ANG (CATHETERS) IMPLANT
CATH OPTITORQUE TIG 4.0 5F (CATHETERS) ×2 IMPLANT
DEVICE RAD COMP TR BAND LRG (VASCULAR PRODUCTS) ×2 IMPLANT
GLIDESHEATH SLEND A-KIT 6F 22G (SHEATH) ×2 IMPLANT
KIT HEART LEFT (KITS) ×2 IMPLANT
PACK CARDIAC CATHETERIZATION (CUSTOM PROCEDURE TRAY) ×2 IMPLANT
SHEATH PINNACLE 5F 10CM (SHEATH) IMPLANT
SYR MEDRAD MARK V 150ML (SYRINGE) IMPLANT
TRANSDUCER W/STOPCOCK (MISCELLANEOUS) ×2 IMPLANT
TUBING CIL FLEX 10 FLL-RA (TUBING) ×2 IMPLANT
WIRE EMERALD 3MM-J .035X150CM (WIRE) IMPLANT
WIRE SAFE-T 1.5MM-J .035X260CM (WIRE) ×2 IMPLANT

## 2015-02-09 NOTE — Progress Notes (Signed)
Patient: Zachary Mcmahon / Admit Date: 02/06/2015 / Date of Encounter: 02/09/2015, 7:31 AM   Subjective: Feeling well. No CP or SOB. No abdominal pain. Eating fine.   Objective: Telemetry: NSR rare PVC Physical Exam: Blood pressure 168/67, pulse 67, temperature 98.6 F (37 C), temperature source Axillary, resp. rate 19, height 5\' 9"  (1.753 m), weight 201 lb (91.173 kg), SpO2 97 %. General: Well developed, well nourished WM, in no acute distress. Head: Normocephalic, atraumatic, sclera non-icteric, no xanthomas, nares are without discharge. Neck: Faint right carotid bruit. JVP not elevated. Lungs: Clear bilaterally to auscultation without wheezes, rales, or rhonchi. Breathing is unlabored. Heart: RRR S1 S2 without murmurs, rubs, or gallops.  Abdomen: Soft, non-tender, non-distended with normoactive bowel sounds. No rebound/guarding. No renal bruits. Extremities: No clubbing or cyanosis. No edema but varicose veins noted. Distal pedal pulses are 2+ and equal bilaterally. Neuro: Alert and oriented X 3. Moves all extremities spontaneously. Psych:  Responds to questions appropriately with a normal affect.   Intake/Output Summary (Last 24 hours) at 02/09/15 0731 Last data filed at 02/09/15 0500  Gross per 24 hour  Intake 1406.19 ml  Output   1000 ml  Net 406.19 ml    Inpatient Medications:  . aspirin EC  81 mg Oral Daily  . atorvastatin  20 mg Oral q1800  . cloNIDine  0.2 mg Oral BID  . gabapentin  100 mg Oral TID  . heart attack bouncing book   Does not apply Once  . insulin aspart  0-15 Units Subcutaneous TID WC  . insulin aspart  0-5 Units Subcutaneous QHS  . insulin detemir  36 Units Subcutaneous QHS  . labetalol  300 mg Oral BID  . pantoprazole  40 mg Oral BID  . sodium chloride  3 mL Intravenous Q12H   Infusions:  . sodium chloride 75 mL/hr at 02/09/15 0500  . heparin 1,450 Units/hr (02/08/15 1808)  . nitroGLYCERIN 45 mcg/min (02/08/15 1808)    Labs:  Recent Labs  02/07/15 0047  02/08/15 0842 02/09/15 0501  NA  --   < > 138 137  K  --   < > 3.9 4.1  CL  --   < > 105 104  CO2  --   < > 26 28  GLUCOSE  --   < > 170* 197*  BUN  --   < > 28* 28*  CREATININE  --   < > 1.78* 1.88*  CALCIUM  --   < > 9.5 9.3  MG 1.7  --   --   --   < > = values in this interval not displayed.  Recent Labs  02/06/15 1611  AST 13*  ALT 10*  ALKPHOS 91  BILITOT 0.9  PROT 7.1  ALBUMIN 4.0    Recent Labs  02/06/15 1612  02/08/15 0524 02/09/15 0501  WBC 9.5  < > 10.0 10.5  NEUTROABS 7.3  --   --   --   HGB 12.1*  < > 10.2* 10.4*  HCT 37.5*  < > 31.4* 32.3*  MCV 92.8  < > 91.5 92.3  PLT 172  < > 155 140*  < > = values in this interval not displayed.  Recent Labs  02/06/15 1835 02/07/15 0047 02/07/15 0533 02/07/15 1204  TROPONINI 0.07* 1.37* 2.48* 3.03*   Invalid input(s): POCBNP No results for input(s): HGBA1C in the last 72 hours.   Radiology/Studies:  Dg Chest Portable 1 View  02/06/2015   CLINICAL DATA:  Chest  pain radiating to both arms beginning today  EXAM: PORTABLE CHEST - 1 VIEW  COMPARISON:  03/20/2012  FINDINGS: Mild enlargement of the cardiomediastinal silhouette with central vascular congestion reidentified. A few interstitial Kerley B-lines are noted at the lung bases which could indicate interstitial edema. No focal pulmonary opacity allowing for technique. No pleural effusion. No acute osseous finding.  IMPRESSION: Mild enlargement of the cardiomediastinal silhouette with suggestion of early interstitial edema. If symptoms persist, consider PA and lateral chest radiographs obtained at full inspiration when the patient is clinically able.   Electronically Signed   By: Conchita Paris M.D.   On: 02/06/2015 16:45   Dg Abd 2 Views  02/06/2015   CLINICAL DATA:  81 year old male with generalized abdominal pain and chest pain radiating to the arms since 1330 hrs. Initial encounter.  EXAM: ABDOMEN - 2 VIEW  COMPARISON:  Portable chest radiograph  1620 hr today. Lumbar MRI 10/21/2009.  FINDINGS: Upright and supine views of the abdomen and pelvis. Moderate gaseous distension of the stomach. Mild elevation of the left hemidiaphragm. No pneumoperitoneum.  Gas-filled but nondilated small and large bowel loops throughout the abdomen and pelvis. Non obstructed pattern. Osteopenia. Postoperative changes to the proximal right femur. No definite acute osseous abnormality.  IMPRESSION: 1. Gaseous distension of the stomach, and gas throughout nondilated small and large bowel loops. The appearance might reflect ileus but does not suggest a mechanical bowel obstruction. 2. No free air.   Electronically Signed   By: Genevie Ann M.D.   On: 02/06/2015 19:52   Dg Abd Portable 1v  02/08/2015   CLINICAL DATA:  Abdominal distention.  EXAM: PORTABLE ABDOMEN - 1 VIEW  COMPARISON:  February 06, 2015.  FINDINGS: Mild gaseous distention of the stomach is again noted. Stool is noted in the right colon. No abnormal dilatation of large or small bowel is seen on this study. Stool is noted in the rectum. No abnormal calcifications are noted. Status post right hip surgery.  IMPRESSION: No evidence of bowel obstruction or ileus. Mild gastric distention of the stomach is again noted.   Electronically Signed   By: Marijo Conception, M.D.   On: 02/08/2015 10:49     Assessment and Plan   1. NSTEMI 2. Reported h/o CAD s/p remote MI, details unclear 3. LV dysfunction EF 45-50% 4. CKD stage III (baseline Cr appears 1.8-1.9) 5. Accelerated HTN 6. Chronic appearing normocytic anemia, may be due to #4, without reported bleeding 7. Right carotid bruit  Risks and benefits of cardiac catheterization have been discussed with the patient.  These include bleeding, infection, kidney damage, stroke, heart attack, death.  The patient understands these risks and is willing to proceed. He is being hydated per the cath orders that were written yesterday. Would avoid LV gram to conserve dye. ARB and Lasix  on hold. Discussed with Dr. [name] who is OK to proceed with cath as long as cathing doc is OK. [name] from the cath lab will be communicating this to Dr. [name]. Will ask nursing to give patient his scheduled clonidine and labetalol now. May need to add Imdur or amlodipine for improved BP control while ARB/Lasix are on hold. Will need carotid duplex at some point - we can do as outpatient.  Signed, Melina Copa PA-C Pager: [phone]

## 2015-02-09 NOTE — Progress Notes (Signed)
UR Completed Jahan Friedlander Graves-Bigelow, RN,BSN [phone]  

## 2015-02-09 NOTE — Progress Notes (Signed)
ANTICOAGULATION CONSULT NOTE - Follow Up Consult  Pharmacy Consult for Heparin Indication: NSTEMI  Allergies  Allergen Reactions  . Bactrim [Sulfamethoxazole-Trimethoprim] Rash    Patient Measurements: Height: 5\' 9"  (175.3 cm) Weight: 201 lb (91.173 kg) IBW/kg (Calculated) : 70.7 Heparin Dosing Weight: 90.8 kg   Vital Signs: Temp: 98.6 F (37 C) (06/20 0728) Temp Source: Axillary (06/20 0728) BP: 130/52 mmHg (06/20 1016) Pulse Rate: 62 (06/20 1016)  Labs:  Recent Labs  02/06/15 1612  02/07/15 0047 02/07/15 0533  02/07/15 1204 02/07/15 1801 02/08/15 0524 02/08/15 0842 02/09/15 0501 02/09/15 0855  HGB 12.1*  --   --  10.8*  --   --   --  10.2*  --  10.4*  --   HCT 37.5*  --   --  32.9*  --   --   --  31.4*  --  32.3*  --   PLT 172  --   --  159  --   --   --  155  --  140*  --   APTT 39*  --   --   --   --   --   --   --   --   --   --   LABPROT 14.9  --   --   --   --   --   --   --   --   --  15.0  INR 1.16  --   --   --   --   --   --   --   --   --  1.16  HEPARINUNFRC  --   < >  --   --   < >  --  0.37 0.31  --  0.49  --   CREATININE  --   --   --  1.95*  --   --   --   --  1.78* 1.88*  --   TROPONINI  --   < > 1.37* 2.48*  --  3.03*  --   --   --   --   --   < > = values in this interval not displayed.  Estimated Creatinine Clearance: 35 mL/min (by C-G formula based on Cr of 1.88).   Assessment: 81 y.o. M presented to [facility] with CP. Transferred to [facility].  PMH: HTN, DM, h/o MI, HLD, CKD stage 3  Anticoagulation: Heparin for NSTEMI Trop up to 3.03. Heparin level 0.49 this AM. CBC stable. TR bank sheath removed 1000.  Goal of Therapy:  Heparin level 0.3-0.7 units/ml Monitor platelets by anticoagulation protocol: Yes   Plan:    Resume heparin 6 hrs after TR band sheath removal at 1450 units/hr at 1600 Will check heparin level 6-8 hrs later.   Niylah Hassan S. Alford Highland, PharmD, BCPS Clinical Staff Pharmacist Pager [phone]  Eilene Ghazi  [place] 02/09/2015,10:38 AM

## 2015-02-09 NOTE — Interval H&P Note (Signed)
History and Physical Interval Note:  02/09/2015 9:28 AM  Zachary Mcmahon  has presented today for surgery, with the diagnosis of NSTEMI,  The various methods of treatment have been discussed with the patient and family. After consideration of risks, benefits and other options for treatment, the patient has consented to  Procedure(s): Left Heart Cath and Coronary Angiography (N/A) with Possible Percutaneous Coronary Intervention as a surgical intervention .  The patient's history has been reviewed, patient examined, no change in status, stable for surgery.  I have reviewed the patient's chart and labs.  Questions were answered to the patient's satisfaction.     [place], [place]  Cath Lab Visit (complete for each Cath Lab visit)  Clinical Evaluation Leading to the Procedure:   ACS: Yes.    Non-ACS:    Anginal Classification: CCS IV  Anti-ischemic medical therapy: Minimal Therapy (1 class of medications)  Non-Invasive Test Results: No non-invasive testing performed - Abnormal Test in 2013 (none since)  Prior CABG: No previous CABG  TIMI Score  Patient Information:  TIMI Score is 5  Revascularization of the presumed culprit artery  A (9)  Indication: 11; Score: 9 TIMI Score  Patient Information:  TIMI Score is 5  Revascularization of multiple coronary arteries when the culprit artery cannot clearly be determined  A (9)  Indication: 12; Score: 9  Deserae Jennings, Leonie Green, M.D., M.S. Interventional Cardiologist   Pager # [phone]

## 2015-02-09 NOTE — Progress Notes (Signed)
Seen and cathed by Dr. [name]. TCTS has been consulyed.

## 2015-02-09 NOTE — Care Management Note (Signed)
Case Management Note  Patient Details  Name: Youssouf Sattler MRN: [id number] Date of Birth: 15-Mar-1932  Subjective/Objective:   Pt admitted for chest pain, Nstemi. Post cath revealed: Severe multivessel CAD  Prox RCA to Mid RCA lesion, 100% stenosed. L-R collaterals from Septal perforators & OM2-dCx.  Ost LAD lesion, 80% stenosed. Mid LAD lesion, 95% stenosed. 1st Diag-1 lesion, tandem 80% & 90% stenoses  Ost Cx lesion, 40% stenosed. Prox Cx lesion, 70% stenosed at OM1, Mid Cx lesion, 95% stenosed just beyond the takeoff of OM2. . Recommendations for CABG. CT surgery consulted.                   Action/Plan: CM to continue to monitor for disposition needs. Pt is from home with family support.   Expected Discharge Date:                  Expected Discharge Plan:  [place]  In-House Referral:     Discharge planning Services  CM Consult  Post Acute Care Choice:    Choice offered to:     DME Arranged:    DME Agency:     HH Arranged:    [place] Agency:     Status of Service:  In process, will continue to follow  Medicare Important Message Given:  Yes Date Medicare IM Given:  02/09/15 Medicare IM give by:  Jacqlyn Krauss, RN,BSN  Date Additional Medicare IM Given:    Additional Medicare Important Message give by:     If discussed at [place] of Stay Meetings, dates discussed:    Additional Comments:  Bethena Roys, RN 02/09/2015, 12:36 PM

## 2015-02-09 NOTE — Consult Note (Signed)
[address]       [place] [address]             [phone]        Romin Tuller [facility] Medical Record [id number] Date of Birth: 02/15/1932  Referring: [name] Gwenlyn Found is Primary) Primary Care: [name], MD  Chief Complaint:    Chief Complaint  Patient presents with  . Chest Pain    History of Present Illness:      Mr. Ebersold is a very pleasant 81 yo white male with known history of DM, Hyperlipidemia, HTN, CKD, and H/O MI in the past.  He presented to [facility] ED with complaints of chest pain.  These developed at home, with patient feeling like it could be reflux.  However, it was also similar in nature to his previous MI symptoms.  The pain was located in his upper chest and radiated down both arms.  There was no associated dyspnea, nausea, vomiting or palpitations.  EMS was called and treated patient with ASA.  In ED he was given NTG with relief in discomfort.  Workup revealed EKG with no ST changes but positive cardiac enzymes.  He was ruled in for NSTEMI and transferred to [facility] for further care.  Upon arrival patient was admitted via Cardiology.  He stated he had a stress test before, but no cardiac catheterization. He underwent cardiac catheterization which showed severe multivessel CAD with a preserved EF.  It was felt Coronary Bypass Grafting would be indicated and TCTS consult was obtained.  Currently patient is chest pain free.  The patient states his diabetes is managed with insulin.  His last A1c in our system is from 2013 and was 7.0.  The patient has bilateral varicose veins.  He also has chronic swelling in his LLE.  He has been ruled out for DVT. 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  He has also have an open repair from a right hip fracture and a right knee scope.   The patient has never smoked.  He states he is fairly active and enjoys fishing.     Current Activity/ Functional Status: Patient is independent with mobility/ambulation, transfers, ADL's, IADL's.   Zubrod Score: At the time of surgery this patient's most appropriate activity status/level should be described as: [x]     0    Normal activity, no symptoms []     1    Restricted in physical strenuous activity but ambulatory, able to do out light work []     2    Ambulatory and capable of self care, unable to do work activities, up and about                 more than 50%  Of the time                            []     3    Only limited self care, in bed greater than 50% of waking hours []     4    Completely disabled, no self care, confined to bed or chair []     5    Moribund  Past Medical History  Diagnosis Date  . Hypertension   . High potassium   . Varicose veins   . Hyperlipidemia   .  Basal cell carcinoma of forehead 2016 X 2  . Basal cell carcinoma of left earlobe   . Old myocardial infarct     "sometime in the past; don't know when" (02/07/2015)  . Type II diabetes mellitus   . Pneumonia X 1  . GERD (gastroesophageal reflux disease)   . Arthritis     "legs" (02/07/2015)  . History of gout X 1  . Diabetic peripheral neuropathy     "left foot" (02/07/2015)    Past Surgical History  Procedure Laterality Date  . Knee arthroscopy Right ~ 2008  . Orif hip fracture  03/20/2012    Procedure: OPEN REDUCTION INTERNAL FIXATION HIP;  Surgeon: Sanjuana Kava, MD;  Location: [facility];  Service: Orthopedics;  Laterality: Right;  . Lower extremity venous doppler  07/11/2012    No evidence of DVT in the left lower extremity. Evidence of partially recanalized, chronic, non-obstructive thrombus in the left great SV and its branches consistent with significant reflux consistent with post-phlebitic syndrome. Significant reflux of the left short saphenous vein.  . Cardiovascular stress test  05/29/2012     Mild-moderate perfusion defect due to infarct/scar with mild-moderate perinfarct ischemia seen in the mid anterior, apical anterior, apical septal, and apical regions. No ECG changes. Global LV systolic function is severely reduced.  . Transthoracic echocardiogram  04/18/2012    EF 45%, mild-moderate LVH  . Transthoracic echocardiogram  04/18/12    EF% 45%.SEVERE HYPOKINESIS TO AKINESIS OF THE MID-DISTAL INFEROLATERAL MYOCARDIUM AND MUCH OF THE APEX.  Marland Kitchen Lexiscam myocardial perfusion  05/29/12    MARKED PERFUSION DEFECT DUE TO INFARC/SCAR WITH MILD PERINFARCT ISCHEMIA IN THE BASAL INFERIOR, MID INFEROSEPTAL, MID INFERIOR AND APICALINFERIOR REGION. EF%33%. PERIINFARCT ISCHEMIA IN THE MID ANTERIOR, APICAL ANTERIOR, APICAL SEPTAL AND APICAL REGIONS.  . Fracture surgery    . Cystoscopy w/ stone manipulation  X 1  . Basal cell carcinoma excision      "probably 1/2 dozen cut off face, left ear" (02/07/2015)    History  Smoking status  . Never Smoker   Smokeless tobacco  . Never Used    History  Alcohol Use No    History   Social History  . Marital Status: Married    Spouse Name: N/A  . Number of Children: N/A  . Years of Education: N/A   Occupational History  . Not on file.   Social History Main Topics  . Smoking status: Never Smoker   . Smokeless tobacco: Never Used  . Alcohol Use: No  . Drug Use: No  . Sexual Activity: Not Currently    Allergies  Allergen Reactions  . Bactrim [Sulfamethoxazole-Trimethoprim] Rash    Current Facility-Administered Medications  Medication Dose Route Frequency Provider Last Rate Last Dose  . 0.9 %  sodium chloride infusion  250 mL Intravenous PRN Leonie Man, MD      . 0.9 %  sodium chloride infusion   Intravenous Continuous Leonie Man, MD 100 mL/hr at 02/09/15 1100    . acetaminophen (TYLENOL) tablet 650 mg  650 mg Oral Q4H PRN Phillips Grout, MD      . alum & mag hydroxide-simeth (MAALOX/MYLANTA) 200-200-20 MG/5ML suspension 30 mL  30  mL Oral Q6H PRN Nishant Dhungel, MD      . aspirin EC tablet 81 mg  81 mg Oral Daily Phillips Grout, MD   81 mg at 02/08/15 0914  . atorvastatin (LIPITOR) tablet 20 mg  20 mg Oral q1800 Phillips Grout, MD  20 mg at 02/08/15 1807  . cloNIDine (CATAPRES) tablet 0.2 mg  0.2 mg Oral BID Nishant Dhungel, MD   0.2 mg at 02/09/15 0815  . gabapentin (NEURONTIN) capsule 100 mg  100 mg Oral TID Nishant Dhungel, MD   100 mg at 02/09/15 0815  . heparin ADULT infusion 100 units/mL (25000 units/250 mL)  1,450 Units/hr Intravenous Continuous Karren Cobble, RPH      . insulin aspart (novoLOG) injection 0-15 Units  0-15 Units Subcutaneous TID WC Nishant Dhungel, MD   3 Units at 02/08/15 1807  . insulin aspart (novoLOG) injection 0-5 Units  0-5 Units Subcutaneous QHS Nishant Dhungel, MD   2 Units at 02/07/15 2207  . insulin detemir (LEVEMIR) injection 36 Units  36 Units Subcutaneous QHS Nishant Dhungel, MD   36 Units at 02/08/15 2149  . labetalol (NORMODYNE) tablet 300 mg  300 mg Oral BID Phillips Grout, MD   300 mg at 02/09/15 0815  . morphine 2 MG/ML injection 2 mg  2 mg Intravenous Q1H PRN Leonie Man, MD      . nitroGLYCERIN 50 mg in dextrose 5 % 250 mL (0.2 mg/mL) infusion  3-45 mcg/min Intravenous Titrated Dianne Dun, NP 13.5 mL/hr at 02/08/15 1808 45 mcg/min at 02/08/15 1808  . ondansetron (ZOFRAN) injection 4 mg  4 mg Intravenous Q6H PRN Phillips Grout, MD      . pantoprazole (PROTONIX) EC tablet 40 mg  40 mg Oral BID Nishant Dhungel, MD   40 mg at 02/09/15 0815  . sodium chloride 0.9 % injection 3 mL  3 mL Intravenous Q12H Leonie Man, MD      . sodium chloride 0.9 % injection 3 mL  3 mL Intravenous PRN Leonie Man, MD        Prescriptions prior to admission  Medication Sig Dispense Refill Last Dose  . aspirin EC 81 MG tablet Take 81 mg by mouth every morning.   02/06/2015 at Unknown time  . atorvastatin (LIPITOR) 20 MG tablet TAKE 1 TABLET BY MOUTH DAILY AT 6:00 P.M. (  MUST SCHEDULE AN APPOINTMENT FOR FUTURE REFILLS ) 30 tablet 0 02/05/2015 at Unknown time  . cloNIDine (CATAPRES) 0.2 MG tablet Take 0.2 mg by mouth 2 (two) times daily.   02/06/2015 at Unknown time  . ferrous sulfate 325 (65 FE) MG tablet Take 325 mg by mouth 2 (two) times daily.   02/06/2015 at Unknown time  . furosemide (LASIX) 40 MG tablet Take 40 mg by mouth 2 (two) times daily.    02/06/2015 at Unknown time  . gabapentin (NEURONTIN) 100 MG capsule Take 100 mg by mouth 3 (three) times daily.    02/06/2015 at Unknown time  . labetalol (NORMODYNE) 300 MG tablet Take 300 mg by mouth 2 (two) times daily.   02/06/2015 at [place]  . LEVEMIR FLEXTOUCH 100 UNIT/ML Pen Inject 36 Units into the skin at bedtime.    02/05/2015 at Unknown time  . losartan (COZAAR) 50 MG tablet Take 50 mg by mouth 2 (two) times daily.    02/06/2015 at Unknown time  . omeprazole (PRILOSEC) 20 MG capsule Take 20 mg by mouth daily.    02/06/2015 at Unknown time  . potassium chloride SA (K-DUR,KLOR-CON) 20 MEQ tablet Take 20 mEq by mouth daily.   02/06/2015 at Unknown time  . traMADol (ULTRAM) 50 MG tablet Take 50 mg by mouth 3 (three) times daily.    02/06/2015 at Unknown time  . insulin  aspart (NOVOLOG) 100 UNIT/ML injection Inject 0-5 Units into the skin at bedtime. 1 vial  Taking  . insulin aspart (NOVOLOG) 100 UNIT/ML injection Inject 0-9 Units into the skin 3 (three) times daily with meals. 1 vial  Taking    Family History  Problem Relation Age of Onset  . Deep vein thrombosis Mother   . Hypertension Mother   . Hypertension Sister   . Diabetes Brother   . Hyperlipidemia Brother   . Hypertension Brother   . Heart attack Brother      Review of Systems:  Constitutional: negative for fevers, night sweats and weight loss Respiratory: negative for cough, dyspnea on exertion and emphysema Cardiovascular: positive for chest pain and on presentation, currently resolved Gastrointestinal: negative for abdominal pain, diarrhea, nausea  and vomiting Genitourinary:positive for H/O Kidney stones Integument/breast: negative Hematologic/lymphatic: positive for varicose veins, chronic thrombus Left GSV Neurological: negative Endocrine: positive for DM, on insulin     Cardiac Review of Systems: Y or N  Chest Pain [  y  ]  Resting SOB [   ] Exertional SOB  [  ]  Orthopnea [  ]   Pedal Edema [ y  ]    Palpitations [  ] Syncope  [  ]   Presyncope [   ]  General Review of Systems: [Y] = yes [  ]=no Constitional: recent weight change [  ]; anorexia [  ]; fatigue [  ]; nausea [  ]; night sweats [  ]; fever [  ]; or chills [  ]                                                               Dental: poor dentition[  ]; Last Dentist visit:   Eye : blurred vision [  ]; diplopia [   ]; vision changes [  ];  Amaurosis fugax[  ]; Resp: cough [  ];  wheezing[  ];  hemoptysis[  ]; shortness of breath[  ]; paroxysmal nocturnal dyspnea[  ]; dyspnea on exertion[  ]; or orthopnea[  ];  GI:  gallstones[  ], vomiting[  ];  dysphagia[  ]; melena[  ];  hematochezia [  ]; heartburn[  ];   Hx of  Colonoscopy[  ]; GU: kidney stones [H/O  ]; hematuria[  ];   dysuria [  ];  nocturia[  ];  history of     obstruction [  ]; urinary frequency [  ]             Skin: rash, swelling[y  ];, hair loss[  ];  peripheral edema[y  ];  or itching[  ]; Musculosketetal: myalgias[  ];  joint swelling[  ];  joint erythema[  ];  joint pain[  ];  back pain[  ];  Heme/Lymph: bruising[  ];  bleeding[  ];  anemia[  ];  Neuro: TIA[  ];  headaches[  ];  stroke[  ];  vertigo[  ];  seizures[  ];   paresthesias[  ];  difficulty walking[  ];  Psych:depression[  ]; anxiety[  ];  Endocrine: diabetes[y  ];  thyroid dysfunction[ n ];  Immunizations: Flu [  ]; Pneumococcal[  ];  Other:  Physical Exam: BP 154/65 mmHg  Pulse 63  Temp(Src) 98.4  F (36.9 C) (Axillary)  Resp 20  Ht 5\' 9"  (1.753 m)  Wt 201 lb (91.173 kg)  BMI 29.67 kg/m2  SpO2 98%  General appearance: alert,  cooperative and no distress Head: Normocephalic, without obvious abnormality, atraumatic Neck: no adenopathy, no JVD, supple, symmetrical, trachea midline, thyroid not enlarged, symmetric, no tenderness/mass/nodules and + Carotid Bruit on left Resp: clear to auscultation bilaterally Cardio: regular rate and rhythm GI: soft, non-tender; bowel sounds normal; no masses,  no organomegaly Extremities: edema LLE and varicose veins noted Neurologic: Grossly normal  Diagnostic Studies & Laboratory data:     Recent Radiology Findings:   Dg Abd Portable 1v  02/08/2015   CLINICAL DATA:  Abdominal distention.  EXAM: PORTABLE ABDOMEN - 1 VIEW  COMPARISON:  February 06, 2015.  FINDINGS: Mild gaseous distention of the stomach is again noted. Stool is noted in the right colon. No abnormal dilatation of large or small bowel is seen on this study. Stool is noted in the rectum. No abnormal calcifications are noted. Status post right hip surgery.  IMPRESSION: No evidence of bowel obstruction or ileus. Mild gastric distention of the stomach is again noted.   Electronically Signed   By: Marijo Conception, M.D.   On: 02/08/2015 10:49     I have independently reviewed the above radiologic studies.  ECHO: LV EF: 45% -  50%  ------------------------------------------------------------------- Indications:   Chest pain 786.51.  ------------------------------------------------------------------- History:  PMH:  Coronary artery disease. Risk factors: NSTEMI. Hypertension. Dyslipidemia.  ------------------------------------------------------------------- Study Conclusions  - Left ventricle: The cavity size was normal. There was mild focal basal hypertrophy of the septum. Systolic function was mildly reduced. The estimated ejection fraction was in the range of 45% to 50%. There is akinesis of the mid-apicalinferoseptal myocardium. Doppler parameters are consistent with abnormal left ventricular  relaxation (grade 1 diastolic dysfunction). - Mitral valve: There was mild regurgitation. - Left atrium: The atrium was mildly dilated. - Right atrium: The atrium was mildly dilated.  Impressions:  - EF is slightly improved when compared to prior echocardiogram.  CATH:  Severe multivessel CAD  Prox RCA to Mid RCA lesion, 100% stenosed. L-R collaterals from Septal perforators & OM2-dCx.  Ost LAD lesion, 80% stenosed. Mid LAD lesion, 95% stenosed. 1st Diag-1 lesion, tandem 80% & 90% stenoses  Ost Cx lesion, 40% stenosed. Prox Cx lesion, 70% stenosed at OM1, Mid Cx lesion, 95% stenosed just beyond the takeoff of OM2.  Known mild-moderately reduced LVEF - normal LVEDP.  I have independently reviewed the above  cath films and reviewed the findings with the  patient .     Recent Lab Findings: Lab Results  Component Value Date   WBC 10.5 02/09/2015   HGB 10.4* 02/09/2015   HCT 32.3* 02/09/2015   PLT 140* 02/09/2015   GLUCOSE 197* 02/09/2015   CHOL 83 02/07/2015   TRIG 91 02/07/2015   HDL 17* 02/07/2015   LDLCALC 48 02/07/2015   ALT 10* 02/06/2015   AST 13* 02/06/2015   NA 137 02/09/2015   K 4.1 02/09/2015   CL 104 02/09/2015   CREATININE 1.88* 02/09/2015   BUN 28* 02/09/2015   CO2 28 02/09/2015   TSH 1.364 02/07/2015   INR 1.16 02/09/2015   HGBA1C 7.0* 03/20/2012   Chronic Kidney Disease   Stage I     GFR >90  Stage II    GFR 60-89  Stage IIIA GFR 45-59  Stage IIIB GFR 30-44  Stage IV   GFR 15-29  Stage V    GFR  <15  Lab Results  Component Value Date   CREATININE 1.88* 02/09/2015   Estimated Creatinine Clearance: 35 mL/min (by C-G formula based on Cr of 1.88).    Assessment / Plan:       1. CAD- needs CABG, ? Questionable suitable conduit with GSV, will need LE doppler to assess GSV 2. DM- insulin at home, Hgb A1c is pending 3. HTN- per Cardiology 4. CKD- creatinine at 1.88 currently, would likely need to stabilize prior to proceeding with surgery-  Stage IIIB CKD 5. Hyperlipidemia- on Lipitor 6. I have discussed CABG with patient with severe 3 v cad, will wait to see results of vein mapping to evaluate for conduit.  Follow up CR in am- then decisde on timing of CABG  I  spent 45 minutes counseling the patient face to face and 50% or more the  time was spent in counseling and coordination of care. The total time spent in the appointment was 65 minutes.   [name] MD      [address] [place] [address] Office [phone]   [place]

## 2015-02-09 NOTE — H&P (View-Only) (Signed)
Patient: Zachary Mcmahon / Admit Date: 02/06/2015 / Date of Encounter: 02/09/2015, 7:31 AM   Subjective: Feeling well. No CP or SOB. No abdominal pain. Eating fine.   Objective: Telemetry: NSR rare PVC Physical Exam: Blood pressure 168/67, pulse 67, temperature 98.6 F (37 C), temperature source Axillary, resp. rate 19, height 5\' 9"  (1.753 m), weight 201 lb (91.173 kg), SpO2 97 %. General: Well developed, well nourished WM, in no acute distress. Head: Normocephalic, atraumatic, sclera non-icteric, no xanthomas, nares are without discharge. Neck: Faint right carotid bruit. JVP not elevated. Lungs: Clear bilaterally to auscultation without wheezes, rales, or rhonchi. Breathing is unlabored. Heart: RRR S1 S2 without murmurs, rubs, or gallops.  Abdomen: Soft, non-tender, non-distended with normoactive bowel sounds. No rebound/guarding. No renal bruits. Extremities: No clubbing or cyanosis. No edema but varicose veins noted. Distal pedal pulses are 2+ and equal bilaterally. Neuro: Alert and oriented X 3. Moves all extremities spontaneously. Psych:  Responds to questions appropriately with a normal affect.   Intake/Output Summary (Last 24 hours) at 02/09/15 0731 Last data filed at 02/09/15 0500  Gross per 24 hour  Intake 1406.19 ml  Output   1000 ml  Net 406.19 ml    Inpatient Medications:  . aspirin EC  81 mg Oral Daily  . atorvastatin  20 mg Oral q1800  . cloNIDine  0.2 mg Oral BID  . gabapentin  100 mg Oral TID  . heart attack bouncing book   Does not apply Once  . insulin aspart  0-15 Units Subcutaneous TID WC  . insulin aspart  0-5 Units Subcutaneous QHS  . insulin detemir  36 Units Subcutaneous QHS  . labetalol  300 mg Oral BID  . pantoprazole  40 mg Oral BID  . sodium chloride  3 mL Intravenous Q12H   Infusions:  . sodium chloride 75 mL/hr at 02/09/15 0500  . heparin 1,450 Units/hr (02/08/15 1808)  . nitroGLYCERIN 45 mcg/min (02/08/15 1808)    Labs:  Recent Labs  02/07/15 0047  02/08/15 0842 02/09/15 0501  NA  --   < > 138 137  K  --   < > 3.9 4.1  CL  --   < > 105 104  CO2  --   < > 26 28  GLUCOSE  --   < > 170* 197*  BUN  --   < > 28* 28*  CREATININE  --   < > 1.78* 1.88*  CALCIUM  --   < > 9.5 9.3  MG 1.7  --   --   --   < > = values in this interval not displayed.  Recent Labs  02/06/15 1611  AST 13*  ALT 10*  ALKPHOS 91  BILITOT 0.9  PROT 7.1  ALBUMIN 4.0    Recent Labs  02/06/15 1612  02/08/15 0524 02/09/15 0501  WBC 9.5  < > 10.0 10.5  NEUTROABS 7.3  --   --   --   HGB 12.1*  < > 10.2* 10.4*  HCT 37.5*  < > 31.4* 32.3*  MCV 92.8  < > 91.5 92.3  PLT 172  < > 155 140*  < > = values in this interval not displayed.  Recent Labs  02/06/15 1835 02/07/15 0047 02/07/15 0533 02/07/15 1204  TROPONINI 0.07* 1.37* 2.48* 3.03*   Invalid input(s): POCBNP No results for input(s): HGBA1C in the last 72 hours.   Radiology/Studies:  Dg Chest Portable 1 View  02/06/2015   CLINICAL DATA:  Chest  pain radiating to both arms beginning today  EXAM: PORTABLE CHEST - 1 VIEW  COMPARISON:  03/20/2012  FINDINGS: Mild enlargement of the cardiomediastinal silhouette with central vascular congestion reidentified. A few interstitial Kerley B-lines are noted at the lung bases which could indicate interstitial edema. No focal pulmonary opacity allowing for technique. No pleural effusion. No acute osseous finding.  IMPRESSION: Mild enlargement of the cardiomediastinal silhouette with suggestion of early interstitial edema. If symptoms persist, consider PA and lateral chest radiographs obtained at full inspiration when the patient is clinically able.   Electronically Signed   By: Zachary Mcmahon M.D.   On: 02/06/2015 16:45   Dg Abd 2 Views  02/06/2015   CLINICAL DATA:  81 year old male with generalized abdominal pain and chest pain radiating to the arms since 1330 hrs. Initial encounter.  EXAM: ABDOMEN - 2 VIEW  COMPARISON:  Portable chest radiograph  1620 hr today. Lumbar MRI 10/21/2009.  FINDINGS: Upright and supine views of the abdomen and pelvis. Moderate gaseous distension of the stomach. Mild elevation of the left hemidiaphragm. No pneumoperitoneum.  Gas-filled but nondilated small and large bowel loops throughout the abdomen and pelvis. Non obstructed pattern. Osteopenia. Postoperative changes to the proximal right femur. No definite acute osseous abnormality.  IMPRESSION: 1. Gaseous distension of the stomach, and gas throughout nondilated small and large bowel loops. The appearance might reflect ileus but does not suggest a mechanical bowel obstruction. 2. No free air.   Electronically Signed   By: Zachary Mcmahon M.D.   On: 02/06/2015 19:52   Dg Abd Portable 1v  02/08/2015   CLINICAL DATA:  Abdominal distention.  EXAM: PORTABLE ABDOMEN - 1 VIEW  COMPARISON:  February 06, 2015.  FINDINGS: Mild gaseous distention of the stomach is again noted. Stool is noted in the right colon. No abnormal dilatation of large or small bowel is seen on this study. Stool is noted in the rectum. No abnormal calcifications are noted. Status post right hip surgery.  IMPRESSION: No evidence of bowel obstruction or ileus. Mild gastric distention of the stomach is again noted.   Electronically Signed   By: Zachary Mcmahon, M.D.   On: 02/08/2015 10:49     Assessment and Plan   1. NSTEMI 2. Reported h/o CAD s/p remote MI, details unclear 3. LV dysfunction EF 45-50% 4. CKD stage III (baseline Cr appears 1.8-1.9) 5. Accelerated HTN 6. Chronic appearing normocytic anemia, may be due to #4, without reported bleeding 7. Right carotid bruit  Risks and benefits of cardiac catheterization have been discussed with the patient.  These include bleeding, infection, kidney damage, stroke, heart attack, death.  The patient understands these risks and is willing to proceed. He is being hydated per the cath orders that were written yesterday. Would avoid LV gram to conserve dye. ARB and Lasix  on hold. Discussed with Dr. Tamala Mcmahon who is OK to proceed with cath as long as cathing doc is OK. Zachary Mcmahon from the cath lab will be communicating this to Dr. Ellyn Mcmahon. Will ask nursing to give patient his scheduled clonidine and labetalol now. May need to add Imdur or amlodipine for improved BP control while ARB/Lasix are on hold. Will need carotid duplex at some point - we can do as outpatient.  Signed, Melina Copa PA-C Pager: [phone]

## 2015-02-09 NOTE — Progress Notes (Signed)
[facility] PROGRESS NOTE  Zachary Mcmahon [id number] DOB: Jan 10, 1932 DOA: 02/06/2015 PCP: [name], MD Brief narrative 81 year old male with history of hypertension, diabetes mellitus, CAD status post MI, abnormal nuclear stress test in 2013 without intervention for unclear lesion,. GERD, dyslipidemia , chronic kidney disease who presented to the ED with acute onset of substernal chest pain with EKG changes and elevated troponin suggestive of an ST EMI. Patient also reported significant acid reflux symptoms for the past few months. He was also found to have uncontrolled hypertension in the ED.  Patient presented to [facility] ED and was started on heparin and nitroglycerin drip. Since they did not have cardiologist over the weekend patient was transferred to a stepdown unit bed at [facility] with cardiology evaluation.   Assessment/Plan: NSTEMI On IV heparin drip. D/ced Nitroglycerin drip. No further chest pain symptoms.History of CAD with MI in the past. Also had positive nuclear stress is in 2013 with anterior wall ischemia and EF of 45%, but did not have any intervention. 2-D echo repeated with EF of 45 -50% with akinesis of the mid apical inferior septal myocardium. Also shows grade 1 diastolic dysfunction. -Serial cardiac enzymes elevated. cardiac cath done today showed significant 3 vessel disease. Cath report pending. Needs CABG. [facility] consulted. -Continue daily aspirin and Lipitor. Continue twice a day labetalol for  blood pressure control.  Acute on chronic kidney disease stage II Likely prerenal with recent diarrhea. Monitor with gentle hydration.   -GERD  Continue PPI twice a day and Maalox  Diarrhea with abdominal pain Currently resolved.  Stool for C. difficile negative.  Continue to treat for GERD  Type 2 diabetes mellitus Continue home dose Lantus and monitor on sliding scale insulin.   Dyslipidemia Continue statin.  Essential  hypertension On labetalol twice a day.resumed his home dose clonidine. Holding Lasix and ACE inhibitor due to acute on chronic kidney injury.        Code Status: Full code Family Communication: none at bedside Disposition Plan: inpt. [facility] consulted  for CABG   Consultants:  Cardiology  [facility]  Procedures:  2-D echo  Cardiac cath  Antibiotics:  None  HPI/Subjective: Patient seen and examined after returning from cardiac cath. Denies any pain or shortness of breath.  Objective: Filed Vitals:   02/09/15 1231  BP: 154/65  Pulse: 63  Temp:   Resp: 20    Intake/Output Summary (Last 24 hours) at 02/09/15 1335 Last data filed at 02/09/15 0500  Gross per 24 hour  Intake 1166.19 ml  Output   1000 ml  Net 166.19 ml   Filed Weights   02/06/15 1549 02/08/15 0400 02/09/15 0500  Weight: 92.987 kg (205 lb) 91.627 kg (202 lb) 91.173 kg (201 lb)    Exam:   General: no acute distress  HEENT: No pallor, moist oral mucosa, supple neck  Chest: Clear to auscultation bilaterally, no added sounds  CVS: Normal S1 and S2, no murmurs rub or gallop  GI: Soft, nondistended, nontender, bowel sounds present  Musculoskeletal: Warm, no edema  CNS: Alert and oriented    Data Reviewed: Basic Metabolic Panel:  Recent Labs Lab 02/06/15 1611 02/07/15 0047 02/07/15 0533 02/08/15 0842 02/09/15 0501  NA 142  --  141 138 137  K 4.4  --  3.8 3.9 4.1  CL 101  --  102 105 104  CO2 30  --  29 26 28   GLUCOSE 220*  --  196* 170* 197*  BUN 41*  --  36* 28* 28*  CREATININE 2.26*  --  1.95* 1.78* 1.88*  CALCIUM 9.4  --  9.2 9.5 9.3  MG  --  1.7  --   --   --    Liver Function Tests:  Recent Labs Lab 02/06/15 1611  AST 13*  ALT 10*  ALKPHOS 91  BILITOT 0.9  PROT 7.1  ALBUMIN 4.0    Recent Labs Lab 02/06/15 1835  LIPASE 49   No results for input(s): AMMONIA in the last 168 hours. CBC:  Recent Labs Lab 02/06/15 1612 02/07/15 0533 02/08/15 0524  02/09/15 0501  WBC 9.5 11.5* 10.0 10.5  NEUTROABS 7.3  --   --   --   HGB 12.1* 10.8* 10.2* 10.4*  HCT 37.5* 32.9* 31.4* 32.3*  MCV 92.8 90.4 91.5 92.3  PLT 172 159 155 140*   Cardiac Enzymes:  Recent Labs Lab 02/06/15 1611 02/06/15 1835 02/07/15 0047 02/07/15 0533 02/07/15 1204  TROPONINI <0.03 0.07* 1.37* 2.48* 3.03*   BNP (last 3 results)  Recent Labs  02/06/15 1612  BNP 279.0*    ProBNP (last 3 results) No results for input(s): PROBNP in the last 8760 hours.  CBG:  Recent Labs Lab 02/08/15 1207 02/08/15 1713 02/08/15 2115 02/09/15 0726 02/09/15 1109  GLUCAP 188* 182* 183* 130* 168*    Recent Results (from the past 240 hour(s))  MRSA PCR Screening     Status: None   Collection Time: 02/06/15 11:45 PM  Result Value Ref Range Status   MRSA by PCR NEGATIVE NEGATIVE Final    Comment:        The GeneXpert MRSA Assay (FDA approved for NASAL specimens only), is one component of a comprehensive MRSA colonization surveillance program. It is not intended to diagnose MRSA infection nor to guide or monitor treatment for MRSA infections.      Studies: Dg Abd Portable 1v  02/08/2015   CLINICAL DATA:  Abdominal distention.  EXAM: PORTABLE ABDOMEN - 1 VIEW  COMPARISON:  February 06, 2015.  FINDINGS: Mild gaseous distention of the stomach is again noted. Stool is noted in the right colon. No abnormal dilatation of large or small bowel is seen on this study. Stool is noted in the rectum. No abnormal calcifications are noted. Status post right hip surgery.  IMPRESSION: No evidence of bowel obstruction or ileus. Mild gastric distention of the stomach is again noted.   Electronically Signed   By: Marijo Conception, M.D.   On: 02/08/2015 10:49    Scheduled Meds: . aspirin EC  81 mg Oral Daily  . atorvastatin  20 mg Oral q1800  . cloNIDine  0.2 mg Oral BID  . gabapentin  100 mg Oral TID  . insulin aspart  0-15 Units Subcutaneous TID WC  . insulin aspart  0-5 Units  Subcutaneous QHS  . insulin detemir  36 Units Subcutaneous QHS  . labetalol  300 mg Oral BID  . pantoprazole  40 mg Oral BID  . sodium chloride  3 mL Intravenous Q12H   Continuous Infusions: . sodium chloride 100 mL/hr at 02/09/15 1100  . heparin    . nitroGLYCERIN 45 mcg/min (02/08/15 1808)      Time spent: 25 minutes    Dorcas Melito, [facility] Pager [phone] If 7PM-7AM, please contact night-coverage at [email or web address], password [facility] 02/09/2015, 1:35 PM  LOS: 3 days

## 2015-02-10 ENCOUNTER — Inpatient Hospital Stay (HOSPITAL_COMMUNITY): Payer: Medicare Other

## 2015-02-10 DIAGNOSIS — J811 Chronic pulmonary edema: Secondary | ICD-10-CM | POA: Insufficient documentation

## 2015-02-10 DIAGNOSIS — J810 Acute pulmonary edema: Secondary | ICD-10-CM

## 2015-02-10 DIAGNOSIS — I200 Unstable angina: Secondary | ICD-10-CM

## 2015-02-10 DIAGNOSIS — J9602 Acute respiratory failure with hypercapnia: Secondary | ICD-10-CM

## 2015-02-10 DIAGNOSIS — I5043 Acute on chronic combined systolic (congestive) and diastolic (congestive) heart failure: Secondary | ICD-10-CM | POA: Insufficient documentation

## 2015-02-10 DIAGNOSIS — M7989 Other specified soft tissue disorders: Secondary | ICD-10-CM

## 2015-02-10 LAB — PULMONARY FUNCTION TEST
DL/VA % pred: 89 %
DL/VA: 4.03 ml/min/mmHg/L
DLCO unc % pred: 60 %
DLCO unc: 18.88 ml/min/mmHg
FEF 25-75 Post: 1.4 L/sec
FEF 25-75 Pre: 1.57 L/sec
FEF2575-%Change-Post: -10 %
FEF2575-%Pred-Post: 75 %
FEF2575-%Pred-Pre: 84 %
FEV1-%Change-Post: -1 %
FEV1-%Pred-Post: 83 %
FEV1-%Pred-Pre: 84 %
FEV1-Post: 2.26 L
FEV1-Pre: 2.29 L
FEV1FVC-%Change-Post: 4 %
FEV1FVC-%Pred-Pre: 102 %
FEV6-%Change-Post: -4 %
FEV6-%Pred-Post: 81 %
FEV6-%Pred-Pre: 86 %
FEV6-Post: 2.92 L
FEV6-Pre: 3.08 L
FEV6FVC-%Change-Post: 1 %
FEV6FVC-%Pred-Post: 106 %
FEV6FVC-%Pred-Pre: 105 %
FVC-%Change-Post: -5 %
FVC-%Pred-Post: 77 %
FVC-%Pred-Pre: 82 %
FVC-Post: 2.96 L
FVC-Pre: 3.14 L
Post FEV1/FVC ratio: 76 %
Post FEV6/FVC ratio: 99 %
Pre FEV1/FVC ratio: 73 %
Pre FEV6/FVC Ratio: 98 %
RV % pred: 100 %
RV: 2.63 L
TLC % pred: 86 %
TLC: 5.9 L

## 2015-02-10 LAB — POCT I-STAT 3, ART BLOOD GAS (G3+)
ACID-BASE DEFICIT: 3 mmol/L — AB (ref 0.0–2.0)
Acid-base deficit: 3 mmol/L — ABNORMAL HIGH (ref 0.0–2.0)
BICARBONATE: 23.4 meq/L (ref 20.0–24.0)
Bicarbonate: 27.8 mEq/L — ABNORMAL HIGH (ref 20.0–24.0)
O2 Saturation: 100 %
O2 Saturation: 99 %
PCO2 ART: 45 mmHg (ref 35.0–45.0)
PH ART: 7.168 — AB (ref 7.350–7.450)
PH ART: 7.325 — AB (ref 7.350–7.450)
PO2 ART: 140 mmHg — AB (ref 80.0–100.0)
Patient temperature: 98.6
Patient temperature: 98.6
TCO2: 30 mmol/L (ref 0–100)
TCO2: 25 mmol/L (ref 0–100)
pCO2 arterial: 76.5 mmHg (ref 35.0–45.0)
pO2, Arterial: 350 mmHg — ABNORMAL HIGH (ref 80.0–100.0)

## 2015-02-10 LAB — TYPE AND SCREEN
ABO/RH(D): A NEG
Antibody Screen: NEGATIVE

## 2015-02-10 LAB — COMPREHENSIVE METABOLIC PANEL
ALT: 12 U/L — ABNORMAL LOW (ref 17–63)
AST: 16 U/L (ref 15–41)
Albumin: 2.9 g/dL — ABNORMAL LOW (ref 3.5–5.0)
Alkaline Phosphatase: 69 U/L (ref 38–126)
Anion gap: 5 (ref 5–15)
BUN: 27 mg/dL — ABNORMAL HIGH (ref 6–20)
CO2: 26 mmol/L (ref 22–32)
Calcium: 9.1 mg/dL (ref 8.9–10.3)
Chloride: 106 mmol/L (ref 101–111)
Creatinine, Ser: 1.74 mg/dL — ABNORMAL HIGH (ref 0.61–1.24)
GFR calc Af Amer: 41 mL/min — ABNORMAL LOW (ref >60)
GFR calc non Af Amer: 35 mL/min — ABNORMAL LOW (ref >60)
Glucose, Bld: 222 mg/dL — ABNORMAL HIGH (ref 65–99)
Potassium: 3.8 mmol/L (ref 3.5–5.1)
Sodium: 137 mmol/L (ref 135–145)
Total Bilirubin: 0.8 mg/dL (ref 0.3–1.2)
Total Protein: 5.5 g/dL — ABNORMAL LOW (ref 6.5–8.1)

## 2015-02-10 LAB — GLUCOSE, CAPILLARY
GLUCOSE-CAPILLARY: 137 mg/dL — AB (ref 65–99)
Glucose-Capillary: 172 mg/dL — ABNORMAL HIGH (ref 65–99)
Glucose-Capillary: 179 mg/dL — ABNORMAL HIGH (ref 65–99)

## 2015-02-10 LAB — CBC
HCT: 30.6 % — ABNORMAL LOW (ref 39.0–52.0)
Hemoglobin: 9.8 g/dL — ABNORMAL LOW (ref 13.0–17.0)
MCH: 29.3 pg (ref 26.0–34.0)
MCHC: 32 g/dL (ref 30.0–36.0)
MCV: 91.3 fL (ref 78.0–100.0)
PLATELETS: 145 10*3/uL — AB (ref 150–400)
RBC: 3.35 MIL/uL — AB (ref 4.22–5.81)
RDW: 13.4 % (ref 11.5–15.5)
WBC: 8.1 10*3/uL (ref 4.0–10.5)

## 2015-02-10 LAB — ABO/RH: ABO/RH(D): A NEG

## 2015-02-10 LAB — HEPARIN LEVEL (UNFRACTIONATED)
HEPARIN UNFRACTIONATED: 0.24 [IU]/mL — AB (ref 0.30–0.70)
Heparin Unfractionated: 0.33 IU/mL (ref 0.30–0.70)

## 2015-02-10 LAB — TROPONIN I: Troponin I: 0.48 ng/mL — ABNORMAL HIGH (ref <0.031)

## 2015-02-10 MED ORDER — FENTANYL BOLUS VIA INFUSION
25.0000 ug | INTRAVENOUS | Status: DC | PRN
  Filled 2015-02-10: qty 25

## 2015-02-10 MED ORDER — MIDAZOLAM HCL 2 MG/2ML IJ SOLN: 1.0000 mg | INTRAMUSCULAR | Status: DC | PRN

## 2015-02-10 MED ORDER — VANCOMYCIN HCL 10 G IV SOLR
1500.0000 mg | INTRAVENOUS | Status: DC
  Filled 2015-02-10: qty 1500

## 2015-02-10 MED ORDER — NITROGLYCERIN IN D5W 200-5 MCG/ML-% IV SOLN
2.0000 ug/min | INTRAVENOUS | Status: DC
  Filled 2015-02-10: qty 250

## 2015-02-10 MED ORDER — ETOMIDATE 2 MG/ML IV SOLN
INTRAVENOUS | Status: AC
  Administered 2015-02-10: 20 mg
  Filled 2015-02-10: qty 20

## 2015-02-10 MED ORDER — PHENYLEPHRINE HCL 10 MG/ML IJ SOLN
30.0000 ug/min | INTRAVENOUS | Status: DC
  Filled 2015-02-10: qty 2

## 2015-02-10 MED ORDER — POTASSIUM CHLORIDE 2 MEQ/ML IV SOLN
80.0000 meq | INTRAVENOUS | Status: DC
  Filled 2015-02-10: qty 40

## 2015-02-10 MED ORDER — DOPAMINE-DEXTROSE 3.2-5 MG/ML-% IV SOLN
0.0000 ug/kg/min | INTRAVENOUS | Status: DC
  Filled 2015-02-10: qty 250

## 2015-02-10 MED ORDER — MAGNESIUM SULFATE 50 % IJ SOLN
40.0000 meq | INTRAMUSCULAR | Status: DC
  Filled 2015-02-10: qty 10

## 2015-02-10 MED ORDER — PANTOPRAZOLE SODIUM 40 MG IV SOLR
40.0000 mg | Freq: Two times a day (BID) | INTRAVENOUS | Status: DC
Start: 2015-02-10 — End: 2015-02-13
  Administered 2015-02-10 – 2015-02-13 (×6): 40 mg via INTRAVENOUS
  Filled 2015-02-10 (×8): qty 40

## 2015-02-10 MED ORDER — MIDAZOLAM HCL 2 MG/2ML IJ SOLN
2.0000 mg | Freq: Once | INTRAMUSCULAR | Status: AC
  Administered 2015-02-10: 2 mg via INTRAVENOUS

## 2015-02-10 MED ORDER — EPINEPHRINE HCL 1 MG/ML IJ SOLN
0.0000 ug/min | INTRAMUSCULAR | Status: DC
  Filled 2015-02-10: qty 4

## 2015-02-10 MED ORDER — TEMAZEPAM 15 MG PO CAPS: 15.0000 mg | ORAL_CAPSULE | Freq: Once | ORAL | Status: AC | PRN

## 2015-02-10 MED ORDER — FUROSEMIDE 10 MG/ML IJ SOLN
INTRAMUSCULAR | Status: AC
  Filled 2015-02-10: qty 8

## 2015-02-10 MED ORDER — CHLORHEXIDINE GLUCONATE CLOTH 2 % EX PADS
6.0000 | MEDICATED_PAD | Freq: Once | CUTANEOUS | Status: AC
  Administered 2015-02-10: 6 via TOPICAL

## 2015-02-10 MED ORDER — FENTANYL CITRATE (PF) 100 MCG/2ML IJ SOLN
100.0000 ug | Freq: Once | INTRAMUSCULAR | Status: AC
  Administered 2015-02-10: 100 ug via INTRAVENOUS

## 2015-02-10 MED ORDER — DEXMEDETOMIDINE HCL IN NACL 400 MCG/100ML IV SOLN
0.1000 ug/kg/h | INTRAVENOUS | Status: DC
  Filled 2015-02-10: qty 100

## 2015-02-10 MED ORDER — GABAPENTIN 250 MG/5ML PO SOLN
100.0000 mg | Freq: Three times a day (TID) | ORAL | Status: DC
  Administered 2015-02-10 – 2015-02-12 (×5): 100 mg
  Filled 2015-02-10 (×9): qty 2

## 2015-02-10 MED ORDER — SODIUM CHLORIDE 0.9 % IV SOLN
INTRAVENOUS | Status: DC
  Filled 2015-02-10: qty 2.5

## 2015-02-10 MED ORDER — PLASMA-LYTE 148 IV SOLN
INTRAVENOUS | Status: DC
  Filled 2015-02-10: qty 2.5

## 2015-02-10 MED ORDER — BISACODYL 5 MG PO TBEC: 5.0000 mg | DELAYED_RELEASE_TABLET | Freq: Once | ORAL | Status: DC

## 2015-02-10 MED ORDER — CHLORHEXIDINE GLUCONATE 0.12 % MT SOLN
15.0000 mL | Freq: Two times a day (BID) | OROMUCOSAL | Status: DC
  Administered 2015-02-10 – 2015-02-13 (×5): 15 mL via OROMUCOSAL
  Filled 2015-02-10 (×4): qty 15

## 2015-02-10 MED ORDER — ROCURONIUM BROMIDE 50 MG/5ML IV SOLN
INTRAVENOUS | Status: AC
  Filled 2015-02-10: qty 2

## 2015-02-10 MED ORDER — DEXTROSE 5 % IV SOLN
1.5000 g | INTRAVENOUS | Status: DC
  Filled 2015-02-10: qty 1.5

## 2015-02-10 MED ORDER — LABETALOL HCL 5 MG/ML IV SOLN
INTRAVENOUS | Status: AC
  Administered 2015-02-10: 10 mg
  Filled 2015-02-10: qty 4

## 2015-02-10 MED ORDER — MIDAZOLAM HCL 2 MG/2ML IJ SOLN
INTRAMUSCULAR | Status: AC
  Administered 2015-02-10: 2 mg
  Filled 2015-02-10: qty 4

## 2015-02-10 MED ORDER — SODIUM CHLORIDE 0.9 % IV SOLN
25.0000 ug/h | INTRAVENOUS | Status: DC
  Administered 2015-02-10: 50 ug/h via INTRAVENOUS
  Filled 2015-02-10: qty 50

## 2015-02-10 MED ORDER — LIDOCAINE HCL (CARDIAC) 20 MG/ML IV SOLN
INTRAVENOUS | Status: AC
  Filled 2015-02-10: qty 5

## 2015-02-10 MED ORDER — FENTANYL CITRATE (PF) 100 MCG/2ML IJ SOLN
INTRAMUSCULAR | Status: AC
  Administered 2015-02-10: 50 ug via INTRAVENOUS
  Filled 2015-02-10: qty 2

## 2015-02-10 MED ORDER — METOPROLOL TARTRATE 12.5 MG HALF TABLET
12.5000 mg | ORAL_TABLET | Freq: Once | ORAL | Status: DC
  Filled 2015-02-10: qty 1

## 2015-02-10 MED ORDER — SODIUM CHLORIDE 0.9 % IV SOLN
INTRAVENOUS | Status: DC
  Filled 2015-02-10: qty 40

## 2015-02-10 MED ORDER — FENTANYL CITRATE (PF) 100 MCG/2ML IJ SOLN
50.0000 ug | Freq: Once | INTRAMUSCULAR | Status: AC
  Administered 2015-02-10: 50 ug via INTRAVENOUS

## 2015-02-10 MED ORDER — ALBUTEROL SULFATE (2.5 MG/3ML) 0.083% IN NEBU
2.5000 mg | INHALATION_SOLUTION | Freq: Once | RESPIRATORY_TRACT | Status: AC
  Administered 2015-02-10: 2.5 mg via RESPIRATORY_TRACT

## 2015-02-10 MED ORDER — CLONIDINE HCL 0.2 MG PO TABS
0.2000 mg | ORAL_TABLET | Freq: Two times a day (BID) | ORAL | Status: DC
  Administered 2015-02-10: 0.2 mg
  Filled 2015-02-10 (×3): qty 1

## 2015-02-10 MED ORDER — FUROSEMIDE 10 MG/ML IJ SOLN
80.0000 mg | Freq: Once | INTRAMUSCULAR | Status: AC
  Administered 2015-02-10: 80 mg via INTRAVENOUS

## 2015-02-10 MED ORDER — HYDRALAZINE HCL 20 MG/ML IJ SOLN
10.0000 mg | INTRAMUSCULAR | Status: DC | PRN
Start: 2015-02-10 — End: 2015-02-13
  Administered 2015-02-10: 20 mg via INTRAVENOUS
  Filled 2015-02-10: qty 1

## 2015-02-10 MED ORDER — METOPROLOL TARTRATE 1 MG/ML IV SOLN: 2.5000 mg | INTRAVENOUS | Status: DC | PRN

## 2015-02-10 MED ORDER — FUROSEMIDE 10 MG/ML IJ SOLN
120.0000 mg | INTRAMUSCULAR | Status: DC
  Filled 2015-02-10: qty 12

## 2015-02-10 MED ORDER — SUCCINYLCHOLINE CHLORIDE 20 MG/ML IJ SOLN
INTRAMUSCULAR | Status: AC
  Filled 2015-02-10: qty 1

## 2015-02-10 MED ORDER — HEPARIN SODIUM (PORCINE) 1000 UNIT/ML IJ SOLN
INTRAMUSCULAR | Status: DC
  Filled 2015-02-10: qty 30

## 2015-02-10 MED ORDER — CETYLPYRIDINIUM CHLORIDE 0.05 % MT LIQD
7.0000 mL | Freq: Four times a day (QID) | OROMUCOSAL | Status: DC
  Administered 2015-02-11 – 2015-02-13 (×9): 7 mL via OROMUCOSAL

## 2015-02-10 MED ORDER — FENTANYL CITRATE (PF) 100 MCG/2ML IJ SOLN
INTRAMUSCULAR | Status: AC
  Administered 2015-02-10: 100 ug
  Filled 2015-02-10: qty 4

## 2015-02-10 MED ORDER — DEXTROSE 5 % IV SOLN
750.0000 mg | INTRAVENOUS | Status: DC
  Filled 2015-02-10: qty 750

## 2015-02-10 MED ORDER — ASPIRIN 81 MG PO CHEW
81.0000 mg | CHEWABLE_TABLET | Freq: Every day | ORAL | Status: DC
  Administered 2015-02-11 – 2015-02-14 (×4): 81 mg
  Filled 2015-02-10 (×4): qty 1

## 2015-02-10 MED ORDER — CHLORHEXIDINE GLUCONATE CLOTH 2 % EX PADS
6.0000 | MEDICATED_PAD | Freq: Once | CUTANEOUS | Status: AC
  Administered 2015-02-11: 6 via TOPICAL

## 2015-02-10 MED FILL — Heparin Sodium (Porcine) 2 Unit/ML in Sodium Chloride 0.9%: INTRAMUSCULAR | Qty: 1500 | Status: AC

## 2015-02-10 MED FILL — Nitroglycerin IV Soln 100 MCG/ML in D5W: INTRA_ARTERIAL | Qty: 10 | Status: AC

## 2015-02-10 MED FILL — Lidocaine HCl Local Preservative Free (PF) Inj 1%: INTRAMUSCULAR | Qty: 30 | Status: AC

## 2015-02-10 NOTE — Significant Event (Signed)
Rapid Response Event Note  Overview: Time Called: 1733 Arrival Time: 1736 Event Type: Respiratory, Cardiac  Initial Focused Assessment: Patient in acute respiratory distress, increased WOB using accessory muscles. Lung sounds with crackles through out. (up to clavicle bilat) BP     SR 90s  RR 32  O2 sat 95% on [place] Heparin and NTG gtts infusing Patient pale and diaphoretic  Interventions: Dr Clementeen Graham, Dr [name] and Dr Servando Snare at bedside during event. 80mg  Lasix given IV push Pt placed on Bipap. 10mg  Labetalol IV Patient  anxious and diaphoretic Urgently transferred to 2H08   Event Summary: Name of Physician Notified: Dr Clementeen Graham at [phone]  Name of Consulting Physician Notified: Dr [name] and Dr Servando Snare at 1735  Outcome: Transferred ([place]) [phone])  Event End Time: [place]  Raliegh Ip

## 2015-02-10 NOTE — Progress Notes (Addendum)
Called by nursing due to chest pain that the patient had while undergoing tests today. 4/10 -> improving, now down to 2/10 with 2mg  morphine. D/w nurse who was also planning to give Maalox to try as patient was c/o feeling like he had to burp. EKG generally unchanged from prior. If CP does not resolve, I asked nursing to try another 2mg  of morphine since this helped improve his pain. If need be we can further titrate NTG as well and transfer to unit for closer monitoring. Asked nurse to keep [place] informed if pain begins escalating or does not resolve. Also reviewed with Dr. [name]. He is tentatively for CABG tomorrow. Keyonna Comunale PA-C

## 2015-02-10 NOTE — Progress Notes (Signed)
ANTICOAGULATION CONSULT NOTE - Follow Up Consult  Pharmacy Consult for heparin Indication: CAD awaiting CABG   Labs:  Recent Labs  02/07/15 0533  02/07/15 1204  02/08/15 0524 02/08/15 0842 02/09/15 0501 02/09/15 0855 02/09/15 2354  HGB 10.8*  --   --   --  10.2*  --  10.4*  --   --   HCT 32.9*  --   --   --  31.4*  --  32.3*  --   --   PLT 159  --   --   --  155  --  140*  --   --   LABPROT  --   --   --   --   --   --   --  15.0  --   INR  --   --   --   --   --   --   --  1.16  --   HEPARINUNFRC  --   < >  --   < > 0.31  --  0.49  --  0.24*  CREATININE 1.95*  --   --   --   --  1.78* 1.88*  --   --   TROPONINI 2.48*  --  3.03*  --   --   --   --   --   --   < > = values in this interval not displayed.    Assessment/Plan:  81yo male subtherapeutic on heparin after resumed post-cath though was previously therapeutic at this rate and likely needs more time to accumulate. Will continue gtt at current rate and check additional level.   Wynona Neat, PharmD, BCPS  02/10/2015,12:47 AM

## 2015-02-10 NOTE — Consult Note (Signed)
PULMONARY / CRITICAL CARE MEDICINE   Name: Zachary Mcmahon MRN: [id number] DOB: 1932/05/26    ADMISSION DATE:  02/06/2015 CONSULTATION DATE:  02/10/2015  REFERRING MD :  Clementeen Graham MD  CHIEF COMPLAINT:  resp distress  INITIAL PRESENTATION: 81 y.o with severe Htn, CKD, severe 3V CAD admitted6/17 with nstemI, transferred to ICU for flash pulmonary edema 6/20, on BiPAP  STUDIES:  2-D echo 6/18 >> EF of 45 -50% with akinesis of the mid apical inferior septal myocardium. Also shows grade 1 diastolic dysfunction. PFTs 6/21 >> no obs, DLCO 60% Cath >> Severe 3 vessel CAD with totally occluded RCA and severe LAD and RCA  SIGNIFICANT EVENTS: 6/21 bipap   HISTORY OF PRESENT ILLNESS:  81 year old male with history of hypertension, diabetes mellitus, CAD status post MI adm 6/17 with with acute onset of substernal chest pain with EKG changes & peak trp 3. He was also found to have uncontrolled hypertension in the ED. Underwent cath & vascular studies, CABG planned in am, suddenly eveloped resp distress / CXR s/o pulm edema & tr to ICU on bipap, threw up x 1, hence placed on NRB  PAST MEDICAL HISTORY :   has a past medical history of Hypertension; High potassium; Varicose veins; Hyperlipidemia; Basal cell carcinoma of forehead (2016 X 2); Basal cell carcinoma of left earlobe; Old myocardial infarct; Type II diabetes mellitus; Pneumonia (X 1); GERD (gastroesophageal reflux disease); Arthritis; History of gout (X 1); and Diabetic peripheral neuropathy.  has past surgical history that includes Knee arthroscopy (Right, ~ 2008); ORIF hip fracture (03/20/2012); LOWER EXTREMITY VENOUS DOPPLER (07/11/2012); Cardiovascular stress test (05/29/2012); transthoracic echocardiogram (04/18/2012); transthoracic echocardiogram (04/18/12); LEXISCAM MYOCARDIAL PERFUSION (05/29/12); Fracture surgery; Cystoscopy w/ stone manipulation (X 1); Excision basal cell carcinoma; and Cardiac catheterization (N/A, 02/09/2015). Prior to  Admission medications   Medication Sig Start Date End Date Taking? Authorizing Provider  aspirin EC 81 MG tablet Take 81 mg by mouth every morning.   Yes Historical Provider, MD  atorvastatin (LIPITOR) 20 MG tablet TAKE 1 TABLET BY MOUTH DAILY AT 6:00 P.M. ( MUST SCHEDULE AN APPOINTMENT FOR FUTURE REFILLS ) 03/27/14  Yes Lorretta Harp, MD  cloNIDine (CATAPRES) 0.2 MG tablet Take 0.2 mg by mouth 2 (two) times daily. 01/20/15  Yes Historical Provider, MD  ferrous sulfate 325 (65 FE) MG tablet Take 325 mg by mouth 2 (two) times daily.   Yes Historical Provider, MD  furosemide (LASIX) 40 MG tablet Take 40 mg by mouth 2 (two) times daily.  01/20/15  Yes Historical Provider, MD  gabapentin (NEURONTIN) 100 MG capsule Take 100 mg by mouth 3 (three) times daily.  04/01/14  Yes Historical Provider, MD  labetalol (NORMODYNE) 300 MG tablet Take 300 mg by mouth 2 (two) times daily.   Yes Historical Provider, MD  LEVEMIR FLEXTOUCH 100 UNIT/ML Pen Inject 36 Units into the skin at bedtime.  01/20/15  Yes Historical Provider, MD  losartan (COZAAR) 50 MG tablet Take 50 mg by mouth 2 (two) times daily.  01/21/15  Yes Historical Provider, MD  omeprazole (PRILOSEC) 20 MG capsule Take 20 mg by mouth daily.  04/02/14  Yes Historical Provider, MD  potassium chloride SA (K-DUR,KLOR-CON) 20 MEQ tablet Take 20 mEq by mouth daily. 04/03/14  Yes Historical Provider, MD  traMADol (ULTRAM) 50 MG tablet Take 50 mg by mouth 3 (three) times daily.  02/13/14  Yes Historical Provider, MD  insulin aspart (NOVOLOG) 100 UNIT/ML injection Inject 0-5 Units into the skin at bedtime. 03/23/12 03/23/13  Delfina Redwood, MD  insulin aspart (NOVOLOG) 100 UNIT/ML injection Inject 0-9 Units into the skin 3 (three) times daily with meals. 03/23/12 03/23/13  Delfina Redwood, MD   Allergies  Allergen Reactions  . Bactrim [Sulfamethoxazole-Trimethoprim] Rash    FAMILY HISTORY:  has no family status information on file.   His 54 y.o mother died 2 wks  ago  SOCIAL HISTORY:  reports that he has never smoked. He has never used smokeless tobacco. He reports that he does not drink alcohol or use illicit drugs.  REVIEW OF SYSTEMS:  Unable to obtain  SUBJECTIVE:   VITAL SIGNS: Temp:  [97.7 F (36.5 C)-99.2 F (37.3 C)] 97.7 F (36.5 C) (06/21 1835) Pulse Rate:  [65-106] 92 (06/21 1835) Resp:  [13-31] 30 (06/21 1835) BP: (141-214)/(59-131) 203/89 mmHg (06/21 1830) SpO2:  [59 %-100 %] 100 % (06/21 1835) FiO2 (%):  [100 %] 100 % (06/21 1759) Weight:  [209 lb (94.802 kg)] 209 lb (94.802 kg) (06/21 0400) HEMODYNAMICS:   VENTILATOR SETTINGS: Vent Mode:  [-] BIPAP FiO2 (%):  [100 %] 100 % Set Rate:  [10 bmp] 10 bmp INTAKE / OUTPUT:  Intake/Output Summary (Last 24 hours) at 02/10/15 1917 Last data filed at 02/10/15 1830  Gross per 24 hour  Intake 942.05 ml  Output    950 ml  Net  -7.95 ml    PHYSICAL EXAMINATION: Gen. Appears stated age, well-nourished, in no distress, normal affect ENT - no lesions, no post nasal drip Neck:  JVD +, no thyromegaly, no carotid bruits Lungs:  use of accessory muscle+s, no dullness to percussion,BL scattered rales , no rhonchi  Cardiovascular: Rhythm regular, heart sounds  normal, no murmurs, 1+ peripheral edema Abdomen: soft and non-tender, no hepatosplenomegaly, BS normal. Musculoskeletal: No deformities, no cyanosis or clubbing Neuro:  alert, non focal, follows commands Skin:  Warm, no lesions/ rash   LABS:  CBC  Recent Labs Lab 02/08/15 0524 02/09/15 0501 02/10/15 0440  WBC 10.0 10.5 8.1  HGB 10.2* 10.4* 9.8*  HCT 31.4* 32.3* 30.6*  PLT 155 140* 145*   Coag's  Recent Labs Lab 02/06/15 1612 02/09/15 0855  APTT 39*  --   INR 1.16 1.16   BMET  Recent Labs Lab 02/08/15 0842 02/09/15 0501 02/10/15 0440  NA 138 137 137  K 3.9 4.1 3.8  CL 105 104 106  CO2 26 28 26   BUN 28* 28* 27*  CREATININE 1.78* 1.88* 1.74*  GLUCOSE 170* 197* 222*   Electrolytes  Recent  Labs Lab 02/07/15 0047  02/08/15 0842 02/09/15 0501 02/10/15 0440  CALCIUM  --   < > 9.5 9.3 9.1  MG 1.7  --   --   --   --   < > = values in this interval not displayed. Sepsis Markers  Recent Labs Lab 02/06/15 1835 02/06/15 2147  LATICACIDVEN 0.9 0.8   ABG  Recent Labs Lab 02/10/15 1831  PHART 7.168*  PCO2ART 76.5*  PO2ART 350.0*   Liver Enzymes  Recent Labs Lab 02/06/15 1611 02/10/15 0440  AST 13* 16  ALT 10* 12*  ALKPHOS 91 69  BILITOT 0.9 0.8  ALBUMIN 4.0 2.9*   Cardiac Enzymes  Recent Labs Lab 02/07/15 0533 02/07/15 1204 02/10/15 1741  TROPONINI 2.48* 3.03* 0.48*   Glucose  Recent Labs Lab 02/09/15 1109 02/09/15 1642 02/09/15 2050 02/10/15 0951 02/10/15 1139 02/10/15 1714  GLUCAP 168* 203* 164* 137* 172* 179*    Imaging US Renal  02/10/2015   CLINICAL  DATA:  Chronic kidney disease.  EXAM: RENAL / URINARY TRACT ULTRASOUND COMPLETE  COMPARISON:  None.  FINDINGS: Right Kidney:  Length: 12.6 cm. Cortical thinning. Diffusely increased echotexture. No focal abnormality or hydronephrosis.  Left Kidney:  Length: 12.3 cm. Cortical thinning. Diffusely increased echotexture. Multiple cysts, the largest in the lower pole measuring 4.4 cm. These appear simple. No hydronephrosis.  Bladder:  Appears normal for degree of bladder distention.  IMPRESSION: Cortical thinning with increased echotexture bilaterally. Findings suggestive of chronic medical renal disease. No hydronephrosis.   Electronically Signed   By: Rolm Baptise M.D.   On: 02/10/2015 09:33   Dg Chest Port 1 View  02/10/2015   CLINICAL DATA:  Sudden onset shortness of breath and respiratory distress  EXAM: PORTABLE CHEST - 1 VIEW  COMPARISON:  The 02/06/2015  FINDINGS: Symmetric alveolar and interstitial opacities. There could be small pleural effusions, limited at the right base due to exclusion from view.  Stable mild cardiomegaly. Vascular pedicle widening, increased from previous.  IMPRESSION: CHF  with widespread alveolar edema.   Electronically Signed   By: Monte Fantasia M.D.   On: 02/10/2015 18:19     ASSESSMENT / PLAN:  PULMONARY OETT A:Acute hypercarbic resp failure due to flash pulm edema P:   No Bipap due to vomiting x1 Use NRB - if WOB increases, intubate  CARDIOVASCULAR  A: Acute pulm edema 3V CAD Acute systolic CHF Hypertensive emergency  P:  Lasix 80 given, diuresing well NTG gtt Use hydralazine & lopressor IV prn Rechk trop IV heparin gtt   RENAL A:  CKD P:   Monitor cr & lytes, replete as needed Expect cr to rise  GASTROINTESTINAL A:  Nausea/ vomiting P:   Zofran npo  HEMATOLOGIC A:  Anemia  P:  Transfuse per ICU criteria  INFECTIOUS A:  No issues P:     ENDOCRINE A:  DM - on insulin   P:   SSI  NEUROLOGIC A:  No issues P:   RASS goal: NA    FAMILY  - Updates: pt & sister 6/21  - Inter-disciplinary family meet or Palliative Care meeting due by:  NA   TODAY'S SUMMARY: Acute pulmonary edema, he may be a candidate for CABG, WOB improved with diuresis, did not tolerate BipPA due to vomiting Intubate if worse D/w Dr gerhardt  The patient is critically ill with multiple organ systems failure and requires high complexity decision making for assessment and support, frequent evaluation and titration of therapies, application of advanced monitoring technologies and extensive interpretation of multiple databases. Critical Care Time devoted to patient care services described in this note independent of APP time is 45 minutes.   Kara Mead MD. Shade Flood. [facility] Pager [phone] If no response call [phone]     02/10/2015, 7:17 PM

## 2015-02-10 NOTE — Procedures (Signed)
Intubation Procedure Note Zachary Mcmahon [id number] 1931/12/26  Procedure: Intubation Indications: Respiratory insufficiency  Procedure Details Consent: Risks of procedure as well as the alternatives and risks of each were explained to the (patient/caregiver).  Consent for procedure obtained. Time Out: Verified patient identification, verified procedure, site/side was marked, verified correct patient position, special equipment/implants available, medications/allergies/relevent history reviewed, required imaging and test results available.  Performed  Maximum sterile technique was used including gloves, hand hygiene and mask.  MAC and 3  Versed 4mg  Fentanyl 200 mcg in divided doses Etomidate 20  Evaluation Hemodynamic Status: BP stable throughout; O2 sats: stable throughout Patient's Current Condition: stable Complications: No apparent complications Patient did tolerate procedure well. Chest X-ray ordered to verify placement.  CXR: pending.   Rigoberto Noel MD 02/10/2015

## 2015-02-10 NOTE — Progress Notes (Signed)
[facility] PROGRESS NOTE  Zachary Mcmahon [id number] DOB: [birth year] DOA: 02/06/2015 PCP: [name], MD Brief narrative 81 year old male with history of hypertension, diabetes mellitus, CAD status post MI, abnormal nuclear stress test in 2013 without intervention for unclear lesion,. GERD, dyslipidemia , chronic kidney disease who presented to the ED with acute onset of substernal chest pain with EKG changes and elevated troponin suggestive of an ST EMI. Patient also reported significant acid reflux symptoms for the past few months. He was also found to have uncontrolled hypertension in the ED.  Patient presented to [facility] ED and was started on heparin and nitroglycerin drip. Since they did not have cardiologist over the weekend patient was transferred to a stepdown unit bed at [facility] with cardiology evaluation.   Assessment/Plan: NSTEMI On IV heparin drip. D/ced Nitroglycerin drip. -History of CAD with MI in the past.  positive nuclear stress is in 2013 with anterior wall ischemia and EF of 45%, but did not have any intervention.  -2-D echo repeated with EF of 45 -50% with akinesis of the mid apical inferior septal myocardium. Also shows grade 1 diastolic dysfunction. -Serial cardiac enzymes elevated. cardiac cath  on 6/20 showed severe multivessel CAD. CTV S consulted for CABG. -Continue daily aspirin and Lipitor. Continue twice a day labetalol for  blood pressure control.  Acute on chronic kidney disease stage II Likely prerenal with recent diarrhea. Improved with IV hydration. I think patient is at his baseline renal function.   -GERD  Continue PPI twice a day and Maalox  Diarrhea with abdominal pain resolved.  Stool for C. difficile negative.  Continue to treat for GERD  Type 2 diabetes mellitus Continue home dose Lantus and monitor on sliding scale insulin.   Dyslipidemia Continue statin.  Essential hypertension On labetalol twice a  day.resumed his home dose clonidine. Holding Lasix and ACE inhibitor due to acute on chronic kidney injury.  Anemia Appears to be anemia of chronic disease.  Diet: Heart healthy/diabetic  Code Status: Full code Family Communication: none at bedside Disposition Plan: inpt. CTVS consulted  for CABG   Consultants:  Cardiology  CTVS  Procedures:  2-D echo  Cardiac cath  Antibiotics:  None  HPI/Subjective: Patient seen and examined. No further chest pain or abdominal discomfort.  Objective: Filed Vitals:   02/10/15 1155  BP: 151/64  Pulse: 65  Temp: 98.4 F (36.9 C)  Resp:     Intake/Output Summary (Last 24 hours) at 02/10/15 1608 Last data filed at 02/10/15 1100  Gross per 24 hour  Intake 936.05 ml  Output    800 ml  Net 136.05 ml   Filed Weights   02/08/15 0400 02/09/15 0500 02/10/15 0400  Weight: 91.627 kg (202 lb) 91.173 kg (201 lb) 94.802 kg (209 lb)    Exam:   General: no acute distress  HEENT: No pallor, moist oral mucosa, supple neck  Chest: Clear to auscultation bilaterally, no added sounds  CVS: Normal S1 and S2, no murmurs rub or gallop  GI: Soft, nondistended, nontender, bowel sounds present  Musculoskeletal: Warm, no edema, cath site appears clean.  CNS: Alert and oriented    Data Reviewed: Basic Metabolic Panel:  Recent Labs Lab 02/06/15 1611 02/07/15 0047 02/07/15 0533 02/08/15 0842 02/09/15 0501 02/10/15 0440  NA 142  --  141 138 137 137  K 4.4  --  3.8 3.9 4.1 3.8  CL 101  --  102 105 104 106  CO2 30  --  29 26 28 26   GLUCOSE 220*  --  196* 170* 197* 222*  BUN 41*  --  36* 28* 28* 27*  CREATININE 2.26*  --  1.95* 1.78* 1.88* 1.74*  CALCIUM 9.4  --  9.2 9.5 9.3 9.1  MG  --  1.7  --   --   --   --    Liver Function Tests:  Recent Labs Lab 02/06/15 1611 02/10/15 0440  AST 13* 16  ALT 10* 12*  ALKPHOS 91 69  BILITOT 0.9 0.8  PROT 7.1 5.5*  ALBUMIN 4.0 2.9*    Recent Labs Lab 02/06/15 1835  LIPASE 49    No results for input(s): AMMONIA in the last 168 hours. CBC:  Recent Labs Lab 02/06/15 1612 02/07/15 0533 02/08/15 0524 02/09/15 0501 02/10/15 0440  WBC 9.5 11.5* 10.0 10.5 8.1  NEUTROABS 7.3  --   --   --   --   HGB 12.1* 10.8* 10.2* 10.4* 9.8*  HCT 37.5* 32.9* 31.4* 32.3* 30.6*  MCV 92.8 90.4 91.5 92.3 91.3  PLT 172 159 155 140* 145*   Cardiac Enzymes:  Recent Labs Lab 02/06/15 1611 02/06/15 1835 02/07/15 0047 02/07/15 0533 02/07/15 1204  TROPONINI <0.03 0.07* 1.37* 2.48* 3.03*   BNP (last 3 results)  Recent Labs  02/06/15 1612  BNP 279.0*    ProBNP (last 3 results) No results for input(s): PROBNP in the last 8760 hours.  CBG:  Recent Labs Lab 02/09/15 1109 02/09/15 1642 02/09/15 2050 02/10/15 0951 02/10/15 1139  GLUCAP 168* 203* 164* 137* 172*    Recent Results (from the past 240 hour(s))  MRSA PCR Screening     Status: None   Collection Time: 02/06/15 11:45 PM  Result Value Ref Range Status   MRSA by PCR NEGATIVE NEGATIVE Final    Comment:        The GeneXpert MRSA Assay (FDA approved for NASAL specimens only), is one component of a comprehensive MRSA colonization surveillance program. It is not intended to diagnose MRSA infection nor to guide or monitor treatment for MRSA infections.      Studies: US Renal  02/10/2015   CLINICAL DATA:  Chronic kidney disease.  EXAM: RENAL / URINARY TRACT ULTRASOUND COMPLETE  COMPARISON:  None.  FINDINGS: Right Kidney:  Length: 12.6 cm. Cortical thinning. Diffusely increased echotexture. No focal abnormality or hydronephrosis.  Left Kidney:  Length: 12.3 cm. Cortical thinning. Diffusely increased echotexture. Multiple cysts, the largest in the lower pole measuring 4.4 cm. These appear simple. No hydronephrosis.  Bladder:  Appears normal for degree of bladder distention.  IMPRESSION: Cortical thinning with increased echotexture bilaterally. Findings suggestive of chronic medical renal disease. No  hydronephrosis.   Electronically Signed   By: Rolm Baptise M.D.   On: 02/10/2015 09:33    Scheduled Meds: . [START ON 02/11/2015] aminocaproic acid (AMICAR) for OHS   Intravenous To OR  . aspirin EC  81 mg Oral Daily  . atorvastatin  20 mg Oral q1800  . [START ON 02/11/2015] cefUROXime (ZINACEF)  IV  1.5 g Intravenous To OR  . [START ON 02/11/2015] cefUROXime (ZINACEF)  IV  750 mg Intravenous To OR  . cloNIDine  0.2 mg Oral BID  . [START ON 02/11/2015] dexmedetomidine  0.1-0.7 mcg/kg/hr Intravenous To OR  . [START ON 02/11/2015] DOPamine  0-10 mcg/kg/min Intravenous To OR  . [START ON 02/11/2015] epinephrine  0-10 mcg/min Intravenous To OR  . gabapentin  100 mg Oral TID  . [START ON 02/11/2015]  heparin-papaverine-plasmalyte irrigation   Irrigation To OR  . [START ON 02/11/2015] heparin 30,000 units/NS 1000 mL solution for CELLSAVER   Other To OR  . insulin aspart  0-15 Units Subcutaneous TID WC  . insulin aspart  0-5 Units Subcutaneous QHS  . insulin detemir  36 Units Subcutaneous QHS  . [START ON 02/11/2015] insulin (NOVOLIN-R) infusion   Intravenous To OR  . labetalol  300 mg Oral BID  . [START ON 02/11/2015] magnesium sulfate  40 mEq Other To OR  . [START ON 02/11/2015] nitroGLYCERIN  2-200 mcg/min Intravenous To OR  . pantoprazole  40 mg Oral BID  . [START ON 02/11/2015] phenylephrine (NEO-SYNEPHRINE) Adult infusion  30-200 mcg/min Intravenous To OR  . [START ON 02/11/2015] potassium chloride  80 mEq Other To OR  . sodium chloride  3 mL Intravenous Q12H  . [START ON 02/11/2015] vancomycin  1,500 mg Intravenous To OR   Continuous Infusions: . heparin 1,450 Units/hr (02/09/15 1651)  . nitroGLYCERIN 45 mcg/min (02/10/15 0451)      Time spent: 25 minutes    Alazay Leicht, [facility] Pager [phone] If 7PM-7AM, please contact night-coverage at [email or web address], password [facility] 02/10/2015, 4:08 PM  LOS: 4 days

## 2015-02-10 NOTE — Progress Notes (Signed)
Patient: Zachary Mcmahon / Admit Date: 02/06/2015 / Date of Encounter: 02/10/2015, 8:50 AM   Subjective: No CP or SOB. Completely asymptomatic. Jovial mood.   Objective: Telemetry: NSR, very brief 10-beat run of a regular appearing SVT, occasional PVCs Physical Exam: Blood pressure 141/59, pulse 68, temperature 98.4 F (36.9 C), temperature source Oral, resp. rate 13, height 5\' 9"  (1.753 m), weight 209 lb (94.802 kg), SpO2 100 %. General: Well developed, well nourished WM, in no acute distress. Head: Normocephalic, atraumatic, sclera non-icteric, no xanthomas, nares are without discharge. Neck: Faint right carotid bruit. JVP not elevated. Lungs: Clear bilaterally to auscultation without wheezes, rales, or rhonchi. Breathing is unlabored. Heart: RRR S1 S2 - very mild SEM. No rubs or gallops.  Abdomen: Soft, non-tender, non-distended with normoactive bowel sounds. No rebound/guarding. No renal bruits. Extremities: No clubbing or cyanosis. No edema but varicose veins noted. Right wrist cath site without hematoma or ecchymosis, good pulse. Neuro: Alert and oriented X 3. Moves all extremities spontaneously. Psych: Responds to questions appropriately with a normal affect.   Intake/Output Summary (Last 24 hours) at 02/10/15 0850 Last data filed at 02/10/15 0558  Gross per 24 hour  Intake 836.05 ml  Output    800 ml  Net  36.05 ml    Inpatient Medications:  . aspirin EC  81 mg Oral Daily  . atorvastatin  20 mg Oral q1800  . cloNIDine  0.2 mg Oral BID  . gabapentin  100 mg Oral TID  . insulin aspart  0-15 Units Subcutaneous TID WC  . insulin aspart  0-5 Units Subcutaneous QHS  . insulin detemir  36 Units Subcutaneous QHS  . labetalol  300 mg Oral BID  . pantoprazole  40 mg Oral BID  . sodium chloride  3 mL Intravenous Q12H   Infusions:  . heparin 1,450 Units/hr (02/09/15 1651)  . nitroGLYCERIN 45 mcg/min (02/10/15 0451)    Labs:  Recent Labs  02/09/15 0501 02/10/15 0440  NA  137 137  K 4.1 3.8  CL 104 106  CO2 28 26  GLUCOSE 197* 222*  BUN 28* 27*  CREATININE 1.88* 1.74*  CALCIUM 9.3 9.1    Recent Labs  02/10/15 0440  AST 16  ALT 12*  ALKPHOS 69  BILITOT 0.8  PROT 5.5*  ALBUMIN 2.9*    Recent Labs  02/09/15 0501 02/10/15 0440  WBC 10.5 8.1  HGB 10.4* 9.8*  HCT 32.3* 30.6*  MCV 92.3 91.3  PLT 140* 145*    Recent Labs  02/07/15 1204  TROPONINI 3.03*   Invalid input(s): POCBNP No results for input(s): HGBA1C in the last 72 hours.   Radiology/Studies:  Dg Chest Portable 1 View  02/06/2015   CLINICAL DATA:  Chest pain radiating to both arms beginning today  EXAM: PORTABLE CHEST - 1 VIEW  COMPARISON:  03/20/2012  FINDINGS: Mild enlargement of the cardiomediastinal silhouette with central vascular congestion reidentified. A few interstitial Kerley B-lines are noted at the lung bases which could indicate interstitial edema. No focal pulmonary opacity allowing for technique. No pleural effusion. No acute osseous finding.  IMPRESSION: Mild enlargement of the cardiomediastinal silhouette with suggestion of early interstitial edema. If symptoms persist, consider PA and lateral chest radiographs obtained at full inspiration when the patient is clinically able.   Electronically Signed   By: Conchita Paris M.D.   On: 02/06/2015 16:45   Dg Abd 2 Views  02/06/2015   CLINICAL DATA:  81 year old male with generalized abdominal pain and chest  pain radiating to the arms since 1330 hrs. Initial encounter.  EXAM: ABDOMEN - 2 VIEW  COMPARISON:  Portable chest radiograph 1620 hr today. Lumbar MRI 10/21/2009.  FINDINGS: Upright and supine views of the abdomen and pelvis. Moderate gaseous distension of the stomach. Mild elevation of the left hemidiaphragm. No pneumoperitoneum.  Gas-filled but nondilated small and large bowel loops throughout the abdomen and pelvis. Non obstructed pattern. Osteopenia. Postoperative changes to the proximal right femur. No definite acute  osseous abnormality.  IMPRESSION: 1. Gaseous distension of the stomach, and gas throughout nondilated small and large bowel loops. The appearance might reflect ileus but does not suggest a mechanical bowel obstruction. 2. No free air.   Electronically Signed   By: Genevie Ann M.D.   On: 02/06/2015 19:52   Dg Abd Portable 1v  02/08/2015   CLINICAL DATA:  Abdominal distention.  EXAM: PORTABLE ABDOMEN - 1 VIEW  COMPARISON:  February 06, 2015.  FINDINGS: Mild gaseous distention of the stomach is again noted. Stool is noted in the right colon. No abnormal dilatation of large or small bowel is seen on this study. Stool is noted in the rectum. No abnormal calcifications are noted. Status post right hip surgery.  IMPRESSION: No evidence of bowel obstruction or ileus. Mild gastric distention of the stomach is again noted.   Electronically Signed   By: Marijo Conception, M.D.   On: 02/08/2015 10:49     Assessment and Plan  1. NSTEMI with multivessel CAD by cath 02/09/15 2. LV dysfunction EF 45-50% 3. CKD stage III (baseline Cr appears 1.8-1.9) 4. Accelerated HTN, improved 5. Chronic appearing normocytic anemia, may be due to #4, without reported bleeding 6. Right carotid bruit - duplex pending 7. Brief run of regular appearing atrial tach  Cr stable post-cath. Plans underway for CABG workup per CVTS. Patient remains chest pain free. Continue ASA, statin, BB. LDL was 48 this admission, at goal. Will discuss timing of resumption of home ARB with MD - may need to see trajectory of Cr post-cath. If need be we can consider adding amlodipine in the meantime. HR will not allow titration of BB. He is also on Lasix at home but currently appears euvolemic so would hold off on this since he just had cath yesterday.  Regarding anemia, will defer to IM whether he should be on home ferrous sulfate. Not sure if this was not resumed for a reason.  Signed, Melina Copa PA-C Pager: [phone]

## 2015-02-10 NOTE — Progress Notes (Signed)
Nurse tech notified me that pt was having difficulty breathing at around 1730. Pts sats were <60% on Room air. Pt immediately placed on 4 L [place], which was switched to non-rebreather as soon as available. Lung sounds had crackles throughout. MD and rapid response nurse notified. Pt was given 80 mg of lasix and 10 mg of lebatolol per MD order. Pt was placed on Bipap by respiratory therapists and transferred to 2 Heart ICU.

## 2015-02-10 NOTE — Progress Notes (Addendum)
       Patient Name: Zachary Mcmahon Date of Encounter: 02/10/2015    SUBJECTIVE:Called to see urgently due to dyspnea and chest pain that started while off floor for pre CABG vascular studies:  TELEMETRY:   NSR / ST Filed Vitals:   02/10/15 0000 02/10/15 0400 02/10/15 1155 02/10/15 1759  BP: 151/60 141/59 151/64   Pulse: 68 68 65   Temp: 98.2 F (36.8 C) 98.4 F (36.9 C) 98.4 F (36.9 C)   TempSrc:   Oral   Resp: 16 13  28   Height:      Weight:  94.802 kg (209 lb)    SpO2: 98% 100% 100%     Intake/Output Summary (Last 24 hours) at 02/10/15 1850 Last data filed at 02/10/15 1830  Gross per 24 hour  Intake 942.05 ml  Output    950 ml  Net  -7.95 ml   LABS: Basic Metabolic Panel:  Recent Labs  02/09/15 0501 02/10/15 0440  NA 137 137  K 4.1 3.8  CL 104 106  CO2 28 26  GLUCOSE 197* 222*  BUN 28* 27*  CREATININE 1.88* 1.74*  CALCIUM 9.3 9.1   CBC:  Recent Labs  02/09/15 0501 02/10/15 0440  WBC 10.5 8.1  HGB 10.4* 9.8*  HCT 32.3* 30.6*  MCV 92.3 91.3  PLT 140* 145*   ABG    Component Value Date/Time   PHART 7.168* 02/10/2015 1831   PCO2ART 76.5* 02/10/2015 1831   PO2ART 350.0* 02/10/2015 1831   HCO3 27.8* 02/10/2015 1831   TCO2 30 02/10/2015 1831   ACIDBASEDEF 3.0* 02/10/2015 1831   O2SAT 100.0 02/10/2015 1831     Radiology/Studies:  CXR Alveolar edema  Physical Exam: Blood pressure 151/64, pulse 65, temperature 98.4 F (36.9 C), temperature source Oral, resp. rate 28, height 5\' 9"  (1.753 m), weight 94.802 kg (209 lb), SpO2 100 %. Weight change: 3.629 kg (8 lb)  Wt Readings from Last 3 Encounters:  02/10/15 94.802 kg (209 lb)  04/23/14 96.163 kg (212 lb)  10/23/12 93.441 kg (206 lb)    Coarse rhonchi and rale throughout both lungs. No murmur or obvious gallop. L> R LE edema.  ASSESSMENT:  1. Acute ischemically mediated pulmonary edema due to A/C combined Systolic and Diastolic HF 2. CKD, stage 3 3. Severe hypertension, R/O RAS 4.  Severe 3 vessel CAD with totally occluded RCA and severe LAD and RCA 5. Hypercapneic respiratory failure  Plan:   IV lasix 80 mg x 2  BIPAP  IV NTG titrated to get SBP < 150 mmHg  IV beta bloker, labetalol given  Foley  Signed out to interventional on call Dr. Claiborne Billings if IABP needed.  Discussed with Drs. Gerhardt and Dhungel who have also been at the bedside over the past hour co-managing the patient.  Track kidney function CRITICAL CARE TIME [address] MIN  Signed, KIZER, GOSHA 02/10/2015, 6:50 PM

## 2015-02-10 NOTE — Progress Notes (Signed)
ANTICOAGULATION CONSULT NOTE - Follow Up Consult  Pharmacy Consult for Heparin Indication: 3V CAD  Allergies  Allergen Reactions  . Bactrim [Sulfamethoxazole-Trimethoprim] Rash    Patient Measurements: Height: 5\' 9"  (175.3 cm) Weight: 209 lb (94.802 kg) IBW/kg (Calculated) : 70.7 Heparin Dosing Weight:    Vital Signs: Temp: 98.4 F (36.9 C) (06/21 0400) BP: 141/59 mmHg (06/21 0400) Pulse Rate: 68 (06/21 0400)  Labs:  Recent Labs  02/07/15 1204  02/08/15 0524 02/08/15 0842 02/09/15 0501 02/09/15 0855 02/09/15 2354 02/10/15 0440  HGB  --   < > 10.2*  --  10.4*  --   --  9.8*  HCT  --   --  31.4*  --  32.3*  --   --  30.6*  PLT  --   --  155  --  140*  --   --  145*  LABPROT  --   --   --   --   --  15.0  --   --   INR  --   --   --   --   --  1.16  --   --   HEPARINUNFRC  --   < > 0.31  --  0.49  --  0.24* 0.33  CREATININE  --   --   --  1.78* 1.88*  --   --  1.74*  TROPONINI 3.03*  --   --   --   --   --   --   --   < > = values in this interval not displayed.  Estimated Creatinine Clearance: 38.5 mL/min (by C-G formula based on Cr of 1.74).   Assessment:  81 y.o. M presented to [facility] with CP. Transferred to [facility].  PMH: HTN, DM, h/o MI, HLD, CKD stage 3  Anticoagulation: Heparin for NSTEMI, Trop up to 3.03.Cath with multi-vessel CAD. Needs CABG. Hgb 10.4>9.8. Heparin level 0.33 in goal this AM.  Goal of Therapy:  Heparin level 0.3-0.7 units/ml Monitor platelets by anticoagulation protocol: Yes   Plan:  Continue IV heparin at 1450 units/hr Daily HL and CBC   Melvinia Ashby S. Alford Highland, PharmD, BCPS Clinical Staff Pharmacist Pager [phone]  Eilene Ghazi Stillinger 02/10/2015,10:24 AM

## 2015-02-10 NOTE — Progress Notes (Addendum)
VASCULAR LAB PRELIMINARY  PRELIMINARY  PRELIMINARY  PRELIMINARY  Pre-op Cardiac Surgery  Carotid Findings:  Bilateral:  1-39% ICA stenosis.  Vertebral artery flow is antegrade.      Upper Extremity Right Left  Brachial Pressures 176  Triphasic  183 Triphasic   Radial Waveforms Triphasic  Triphasic   Ulnar Waveforms Triphasic Triphasic   Palmar Arch (Allen's Test) Within normal limits  Obliterates with radial compression, normal with ulanr compression.     Lower  Extremity Right Left  Dorsalis Pedis  Not done.  Pt had chest pain and arm numbness.  Returned him to his room  Anterior Tibial 166  Monophasic    Posterior Tibial Triphasic  Pressure not taken due to chest pain   Ankle/Brachial Indices 0.94     Right Lower Extremity Vein Map    Right Great Saphenous Vein   Segment Diameter Comment  1. Origin 2.07 mm Anterior branch 3.5 mm  2. High Thigh 4.08 mm 2.02 mm  3. Mid Thigh 3.34 mm 1.8 mm   4. Low Thigh 3.45 mm   5. At Knee 2.18 mm   6. High Calf 2.86 mm   7. Low Calf 2.81 mm   8. 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  Synda Bagent, RVT 02/10/2015, 5:44 PM

## 2015-02-10 NOTE — Progress Notes (Signed)
Pt started experiencing left sided chest and arm pain at procedure. Reported pain at a 1/10. When pt arrived on the floor pain had progressed to 4/10. 2 mg IV morphine given, ECG obtained and Melina Copa, PA notified. Directed to monitor pts pain and administer another dose of morphine if pain continues. Wardell Honour, RN

## 2015-02-10 NOTE — Progress Notes (Signed)
CARDIAC REHAB PHASE I   PRE:  Rate/Rhythm: 2 SR  BP:  Supine:   Sitting: 160/93  Standing:    SaO2: 98%RA  MODE:  Ambulation: 300 ft Rolled back to room 166ft POST:  Rate/Rhythm: 64   BP:  Supine:   Sitting: 169/68  Standing:    SaO2: 99%RA 1055-1155 Pt had c/o bottom of right foot at instep hurting but it seemed to get better with walk. Pt walked 332ft on RA with rolling walker and asst x 1 when he c/o feeling very tired as if he had been in marathon. Sat pt in chair and his arms and chest were beginning to hurt. Rolled pt back to room and to recliner. Heart rate at 64, BP and sats as above. Discomfort eased off with rest. Discussed with pt sternal precautions and importance of IS and walking after surgery. He stated he could get to 2250 ml on IS. Pt puts pressure on arms when he walks so will be good to get PT consult after surgery. Wife has Alzheimers and he has care giver that comes in to help. He made it sound as if he has family and the care taker that will be available to help after surgery. Pt has OHS booklet and I gave care guide. Wrote how to view pre op video when family here. Pt was pain free when I left. Will see pt after surgery as will not walk since pt had CP. Discussed with pt that we would not walk again until after surgery due to discomfort.   Graylon Good, RN BSN  02/10/2015 11:50 AM

## 2015-02-10 NOTE — Progress Notes (Addendum)
Patient returned from a vascular lab andsuddenly became short of breath. He started complaining of chest pain. On exam patient was markedly hypertensive with blood pressure of 230/120 mmhg,  tachypneic and tachycardic. Had increased JVD and chest exam is bilateral coarse crackles. Patient was given 2 mg IV morphine and placed on NRB. Patient then given 80 mg IV. 500 mL urine output. Chest x-ray shows/pulmonary edema. Patient transition to BiPAP. Patient given 10 mg IV labetalol and transferred to [place]. Foley placed with 350 cc output. ordered another 80 mg IV lasix. Chest pain resolved. Continued on   IV heparin and nitroglycerin drip. ABG showing pH of 7.16, PCO2 of 76 and PO2 of 350 .  patient had an episode of vomiting and was then transitioned to nonrebreather as O2 sats were improved. Cardiology and CTVS closely involved. PCCM notified who will monitor pt. Pt showing increased work of breathing. High risk for intubation.  Sister at bedside who was explained plan of care  in detail.

## 2015-02-10 NOTE — Progress Notes (Signed)
Patient ID: Zachary Mcmahon, male   DOB: 07-12-1932, 81 y.o.   MRN: [id number] TCTS DAILY ICU PROGRESS NOTE                   [address]            [place] [address]          [phone]   1 Day Post-Op Procedure(s) (LRB): Left Heart Cath and Coronary Angiography (N/A)  Total Length of Stay:  LOS: 4 days   Subjective: Patient seen in vascular lab this afternoon getting venous doppler done was comfortable. I returned to the floor at 6pm to discuss results with patient I found him in flash pulmonary edema and hospital service  in attendance. Patient transvered to ccu on bipap, abg showed co2 retention, has been given lasix. ekg done and reviewed with dr [name]. Patient vomited large amount of undigested food.  Chest discomfort improved   Objective: Vital signs in last 24 hours: Temp:  [97.7 F (36.5 C)-99.2 F (37.3 C)] 97.7 F (36.5 C) (06/21 1835) Pulse Rate:  [65-106] 92 (06/21 1835) Cardiac Rhythm:  [-] Normal sinus rhythm (06/21 1759) Resp:  [13-31] 30 (06/21 1835) BP: (141-214)/(59-131) 203/89 mmHg (06/21 1830) SpO2:  [59 %-100 %] 100 % (06/21 1835) FiO2 (%):  [100 %] 100 % (06/21 1759) Weight:  [209 lb (94.802 kg)] 209 lb (94.802 kg) (06/21 0400)  Filed Weights   02/08/15 0400 02/09/15 0500 02/10/15 0400  Weight: 202 lb (91.627 kg) 201 lb (91.173 kg) 209 lb (94.802 kg)    Weight change: 8 lb (3.629 kg)   Hemodynamic parameters for last 24 hours:    Intake/Output from previous day: 06/20 0701 - 06/21 0700 In: 836.1 [P.O.:540; I.V.:296.1] Out: 800 [Urine:800]  Intake/Output this shift: Total I/O In: 526 [P.O.:480; I.V.:46] Out: 650 [Urine:650]  Current Meds: Scheduled Meds: . [START ON 02/11/2015] aminocaproic acid (AMICAR) for OHS   Intravenous To OR  . aspirin EC  81 mg Oral Daily  . atorvastatin  20 mg Oral q1800  . [START ON 02/11/2015] cefUROXime (ZINACEF)  IV  1.5 g Intravenous To OR  . [START ON 02/11/2015] cefUROXime (ZINACEF)  IV  750  mg Intravenous To OR  . cloNIDine  0.2 mg Oral BID  . [START ON 02/11/2015] dexmedetomidine  0.1-0.7 mcg/kg/hr Intravenous To OR  . [START ON 02/11/2015] DOPamine  0-10 mcg/kg/min Intravenous To OR  . [START ON 02/11/2015] epinephrine  0-10 mcg/min Intravenous To OR  . furosemide      . furosemide  120 mg Intravenous STAT  . gabapentin  100 mg Oral TID  . [START ON 02/11/2015] heparin-papaverine-plasmalyte irrigation   Irrigation To OR  . [START ON 02/11/2015] heparin 30,000 units/NS 1000 mL solution for CELLSAVER   Other To OR  . insulin aspart  0-15 Units Subcutaneous TID WC  . insulin aspart  0-5 Units Subcutaneous QHS  . insulin detemir  36 Units Subcutaneous QHS  . [START ON 02/11/2015] insulin (NOVOLIN-R) infusion   Intravenous To OR  . labetalol  300 mg Oral BID  . [START ON 02/11/2015] magnesium sulfate  40 mEq Other To OR  . [START ON 02/11/2015] nitroGLYCERIN  2-200 mcg/min Intravenous To OR  . pantoprazole  40 mg Oral BID  . [START ON 02/11/2015] phenylephrine (NEO-SYNEPHRINE) Adult infusion  30-200 mcg/min Intravenous To OR  . [START ON 02/11/2015] potassium chloride  80 mEq Other To OR  . sodium chloride  3 mL Intravenous  Q12H  . [START ON 02/11/2015] vancomycin  1,500 mg Intravenous To OR   Continuous Infusions: . heparin 1,450 Units/hr (02/09/15 1651)  . nitroGLYCERIN 60 mcg/min (02/10/15 1830)   PRN Meds:.sodium chloride, acetaminophen, alum & mag hydroxide-simeth, morphine injection, ondansetron (ZOFRAN) IV, sodium chloride  General appearance: cooperative, appears older than stated age and moderate distress Neurologic: intact Heart: regular rate and rhythm, S1, S2 normal, no murmur, click, rub or gallop Lungs: diminished breath sounds bilaterally Abdomen: distended abdomen, not tender Extremities: edema both ankles left greater then right  Lab Results: CBC: Recent Labs  02/09/15 0501 02/10/15 0440  WBC 10.5 8.1  HGB 10.4* 9.8*  HCT 32.3* 30.6*  PLT 140* 145*    BMET:  Recent Labs  02/09/15 0501 02/10/15 0440  NA 137 137  K 4.1 3.8  CL 104 106  CO2 28 26  GLUCOSE 197* 222*  BUN 28* 27*  CREATININE 1.88* 1.74*  CALCIUM 9.3 9.1    PT/INR:  Recent Labs  02/09/15 0855  LABPROT 15.0  INR 1.16   ABG    Component Value Date/Time   PHART 7.168* 02/10/2015 1831   PCO2ART 76.5* 02/10/2015 1831   PO2ART 350.0* 02/10/2015 1831   HCO3 27.8* 02/10/2015 1831   TCO2 30 02/10/2015 1831   ACIDBASEDEF 3.0* 02/10/2015 1831   O2SAT 100.0 02/10/2015 1831     Radiology:    US Renal  02/10/2015   CLINICAL DATA:  Chronic kidney disease.  EXAM: RENAL / URINARY TRACT ULTRASOUND COMPLETE  COMPARISON:  None.  FINDINGS: Right Kidney:  Length: 12.6 cm. Cortical thinning. Diffusely increased echotexture. No focal abnormality or hydronephrosis.  Left Kidney:  Length: 12.3 cm. Cortical thinning. Diffusely increased echotexture. Multiple cysts, the largest in the lower pole measuring 4.4 cm. These appear simple. No hydronephrosis.  Bladder:  Appears normal for degree of bladder distention.  IMPRESSION: Cortical thinning with increased echotexture bilaterally. Findings suggestive of chronic medical renal disease. No hydronephrosis.   Electronically Signed   By: Rolm Baptise M.D.   On: 02/10/2015 09:33   Dg Chest Port 1 View  02/10/2015   CLINICAL DATA:  Sudden onset shortness of breath and respiratory distress  EXAM: PORTABLE CHEST - 1 VIEW  COMPARISON:  The 02/06/2015  FINDINGS: Symmetric alveolar and interstitial opacities. There could be small pleural effusions, limited at the right base due to exclusion from view.  Stable mild cardiomegaly. Vascular pedicle widening, increased from previous.  IMPRESSION: CHF with widespread alveolar edema.   Electronically Signed   By: Monte Fantasia M.D.   On: 02/10/2015 18:19     Assessment/Plan: S/P Procedure(s) (LRB): Left Heart Cath and Coronary Angiography (N/A) Control BP Diuresis Seen by Dr Elsworth Soho consider  intubation if work of breathing does not improve Patient was being considered for CABG tomorrow, with respiratory failure esp co2 retention and renal insufficiency may not be suitable for cabg tomorrow.  Will keep npo and reevaluate in am including renal function    [name] 02/10/2015 7:00 PM

## 2015-02-11 ENCOUNTER — Inpatient Hospital Stay (HOSPITAL_COMMUNITY): Payer: Medicare Other

## 2015-02-11 ENCOUNTER — Encounter (HOSPITAL_COMMUNITY)
Admission: EM | Disposition: A | Discharge status: Skilled Nursing Facility | Payer: Self-pay | Source: Home / Self Care | Attending: Cardiothoracic Surgery

## 2015-02-11 ENCOUNTER — Other Ambulatory Visit (HOSPITAL_COMMUNITY): Payer: Medicare Other

## 2015-02-11 DIAGNOSIS — I255 Ischemic cardiomyopathy: Secondary | ICD-10-CM

## 2015-02-11 DIAGNOSIS — I959 Hypotension, unspecified: Secondary | ICD-10-CM | POA: Insufficient documentation

## 2015-02-11 LAB — BASIC METABOLIC PANEL
Anion gap: 12 (ref 5–15)
Anion gap: 9 (ref 5–15)
BUN: 32 mg/dL — ABNORMAL HIGH (ref 6–20)
BUN: 36 mg/dL — ABNORMAL HIGH (ref 6–20)
CALCIUM: 9.8 mg/dL (ref 8.9–10.3)
CO2: 22 mmol/L (ref 22–32)
CO2: 25 mmol/L (ref 22–32)
Calcium: 9.4 mg/dL (ref 8.9–10.3)
Chloride: 103 mmol/L (ref 101–111)
Chloride: 104 mmol/L (ref 101–111)
Creatinine, Ser: 1.94 mg/dL — ABNORMAL HIGH (ref 0.61–1.24)
Creatinine, Ser: 2.54 mg/dL — ABNORMAL HIGH (ref 0.61–1.24)
GFR calc Af Amer: 36 mL/min — ABNORMAL LOW (ref >60)
GFR calc Af Amer: 26 mL/min — ABNORMAL LOW (ref >60)
GFR calc non Af Amer: 31 mL/min — ABNORMAL LOW (ref >60)
GFR, EST NON AFRICAN AMERICAN: 22 mL/min — AB (ref >60)
GLUCOSE: 225 mg/dL — AB (ref 65–99)
Glucose, Bld: 239 mg/dL — ABNORMAL HIGH (ref 65–99)
Potassium: 4.3 mmol/L (ref 3.5–5.1)
Potassium: 4.4 mmol/L (ref 3.5–5.1)
Sodium: 137 mmol/L (ref 135–145)
Sodium: 138 mmol/L (ref 135–145)

## 2015-02-11 LAB — CBC
HCT: 39.5 % (ref 39.0–52.0)
HCT: 34.1 % — ABNORMAL LOW (ref 39.0–52.0)
Hemoglobin: 12.9 g/dL — ABNORMAL LOW (ref 13.0–17.0)
Hemoglobin: 11.2 g/dL — ABNORMAL LOW (ref 13.0–17.0)
MCH: 29.9 pg (ref 26.0–34.0)
MCH: 29.9 pg (ref 26.0–34.0)
MCHC: 32.7 g/dL (ref 30.0–36.0)
MCHC: 32.8 g/dL (ref 30.0–36.0)
MCV: 91.6 fL (ref 78.0–100.0)
MCV: 90.9 fL (ref 78.0–100.0)
PLATELETS: 160 10*3/uL (ref 150–400)
Platelets: 168 10*3/uL (ref 150–400)
RBC: 4.31 MIL/uL (ref 4.22–5.81)
RBC: 3.75 MIL/uL — AB (ref 4.22–5.81)
RDW: 13.4 % (ref 11.5–15.5)
RDW: 13.4 % (ref 11.5–15.5)
WBC: 13.1 10*3/uL — ABNORMAL HIGH (ref 4.0–10.5)
WBC: 17.2 10*3/uL — ABNORMAL HIGH (ref 4.0–10.5)

## 2015-02-11 LAB — HEPARIN LEVEL (UNFRACTIONATED)
Heparin Unfractionated: 0.2 IU/mL — ABNORMAL LOW (ref 0.30–0.70)
Heparin Unfractionated: 0.25 IU/mL — ABNORMAL LOW (ref 0.30–0.70)
Heparin Unfractionated: 0.34 IU/mL (ref 0.30–0.70)

## 2015-02-11 LAB — URINE MICROSCOPIC-ADD ON

## 2015-02-11 LAB — URINALYSIS, ROUTINE W REFLEX MICROSCOPIC
BILIRUBIN URINE: NEGATIVE
GLUCOSE, UA: NEGATIVE mg/dL
Ketones, ur: NEGATIVE mg/dL
Nitrite: NEGATIVE
Protein, ur: NEGATIVE mg/dL
Specific Gravity, Urine: 1.008 (ref 1.005–1.030)
Urobilinogen, UA: 0.2 mg/dL (ref 0.0–1.0)
pH: 5 (ref 5.0–8.0)

## 2015-02-11 LAB — LACTIC ACID, PLASMA
Lactic Acid, Venous: 1.9 mmol/L (ref 0.5–2.0)
Lactic Acid, Venous: 1.6 mmol/L (ref 0.5–2.0)

## 2015-02-11 LAB — GLUCOSE, CAPILLARY
Glucose-Capillary: 236 mg/dL — ABNORMAL HIGH (ref 65–99)
Glucose-Capillary: 235 mg/dL — ABNORMAL HIGH (ref 65–99)
Glucose-Capillary: 220 mg/dL — ABNORMAL HIGH (ref 65–99)
Glucose-Capillary: 148 mg/dL — ABNORMAL HIGH (ref 65–99)

## 2015-02-11 LAB — SURGICAL PCR SCREEN
MRSA, PCR: NEGATIVE
Staphylococcus aureus: NEGATIVE

## 2015-02-11 LAB — TROPONIN I
Troponin I: 0.66 ng/mL (ref <0.031)
Troponin I: 5.75 ng/mL (ref <0.031)

## 2015-02-11 LAB — PROCALCITONIN: Procalcitonin: 0.58 ng/mL

## 2015-02-11 SURGERY — CORONARY ARTERY BYPASS GRAFTING (CABG)
Anesthesia: General | Site: Chest

## 2015-02-11 MED ORDER — INSULIN ASPART 100 UNIT/ML ~~LOC~~ SOLN
0.0000 [IU] | SUBCUTANEOUS | Status: DC
  Administered 2015-02-11: 2 [IU] via SUBCUTANEOUS
  Administered 2015-02-12: 3 [IU] via SUBCUTANEOUS
  Administered 2015-02-12: 2 [IU] via SUBCUTANEOUS
  Administered 2015-02-12 (×2): 3 [IU] via SUBCUTANEOUS
  Administered 2015-02-13: 5 [IU] via SUBCUTANEOUS
  Administered 2015-02-13: 3 [IU] via SUBCUTANEOUS
  Administered 2015-02-13: 2 [IU] via SUBCUTANEOUS
  Administered 2015-02-13: 5 [IU] via SUBCUTANEOUS
  Administered 2015-02-14: 8 [IU] via SUBCUTANEOUS
  Administered 2015-02-14: 5 [IU] via SUBCUTANEOUS

## 2015-02-11 MED ORDER — VANCOMYCIN HCL IN DEXTROSE 1-5 GM/200ML-% IV SOLN
1000.0000 mg | INTRAVENOUS | Status: DC
  Filled 2015-02-11: qty 200

## 2015-02-11 MED ORDER — VANCOMYCIN HCL 10 G IV SOLR
1750.0000 mg | Freq: Once | INTRAVENOUS | Status: AC
  Administered 2015-02-11: 1750 mg via INTRAVENOUS
  Filled 2015-02-11: qty 1750

## 2015-02-11 MED ORDER — ACETAMINOPHEN 650 MG RE SUPP: 650.0000 mg | Freq: Four times a day (QID) | RECTAL | Status: DC | PRN

## 2015-02-11 MED ORDER — PIPERACILLIN-TAZOBACTAM 3.375 G IVPB 30 MIN
3.3750 g | Freq: Once | INTRAVENOUS | Status: AC
  Administered 2015-02-11: 3.375 g via INTRAVENOUS
  Filled 2015-02-11: qty 50

## 2015-02-11 MED ORDER — DEXTROSE 5 % IV SOLN
30.0000 ug/min | INTRAVENOUS | Status: DC
  Administered 2015-02-11: 75 ug/min via INTRAVENOUS
  Filled 2015-02-11 (×2): qty 4

## 2015-02-11 MED ORDER — ACETAMINOPHEN 160 MG/5ML PO SOLN
650.0000 mg | Freq: Four times a day (QID) | ORAL | Status: DC | PRN
  Administered 2015-02-11 (×3): 650 mg via ORAL
  Filled 2015-02-11 (×3): qty 20.3

## 2015-02-11 MED ORDER — PIPERACILLIN-TAZOBACTAM 3.375 G IVPB
3.3750 g | Freq: Three times a day (TID) | INTRAVENOUS | Status: DC
  Administered 2015-02-11 – 2015-02-14 (×9): 3.375 g via INTRAVENOUS
  Filled 2015-02-11 (×10): qty 50

## 2015-02-11 MED ORDER — ACETAMINOPHEN 160 MG/5ML PO SOLN: 650.0000 mg | Freq: Four times a day (QID) | ORAL | Status: DC | PRN

## 2015-02-11 MED ORDER — DEXTROSE 5 % IV SOLN
30.0000 ug/min | INTRAVENOUS | Status: DC
  Administered 2015-02-11: 30 ug/min via INTRAVENOUS
  Filled 2015-02-11: qty 1

## 2015-02-11 NOTE — Progress Notes (Signed)
ANTICOAGULATION CONSULT NOTE - Follow Up Consult  Pharmacy Consult for heparin Indication: CAD awaiting CABG   Labs:  Recent Labs  02/09/15 0501 02/09/15 0855 02/09/15 2354 02/10/15 0440 02/10/15 1741 02/11/15 0032  HGB 10.4*  --   --  9.8*  --  12.9*  HCT 32.3*  --   --  30.6*  --  39.5  PLT 140*  --   --  145*  --  168  LABPROT  --  15.0  --   --   --   --   INR  --  1.16  --   --   --   --   HEPARINUNFRC 0.49  --  0.24* 0.33  --  0.20*  CREATININE 1.88*  --   --  1.74*  --  1.94*  TROPONINI  --   --   --   --  0.48*  --      Assessment: 82yo male subtherapeutic on heparin after one level at low end of goal, no gtt issues per RN.  Plan for second case CABG this am.  Goal of Therapy:  Heparin level 0.3-0.7 units/ml   Plan:  Will increaseheparin gtt by 1-2 units/kg/hr to 1600 units/hr and check level in [place], PharmD, BCPS  02/11/2015,1:23 AM

## 2015-02-11 NOTE — Progress Notes (Signed)
eLink Physician-Brief Progress Note Patient Name: Zachary Mcmahon DOB: April 16, 1932 MRN: [id number]   Date of Service  02/11/2015  HPI/Events of Note  Patient intubated today for resp distress following failed BiPAP and episode of vomiting.  Now with temp of 101.8, elevated WBC of 13, and hypotension but on fentanyl and NTG gtt.  eICU Interventions  Plan: Will fully culture but suspect patient may have a pneumonitis from aspiration. Check lactate and PCT - if elevated ABX PCCM to evaluate at bedside.     Intervention Category Intermediate Interventions: Other:  DETERDING,ELIZABETH 02/11/2015, 1:16 AM

## 2015-02-11 NOTE — Progress Notes (Signed)
  Echocardiogram 2D Echocardiogram has been performed.  Zachary Mcmahon 02/11/2015, 3:44 PM

## 2015-02-11 NOTE — Procedures (Signed)
Central Venous Catheter Insertion Procedure Note Zachary Mcmahon [id number] 1931/12/06  Procedure: Insertion of Central Venous Catheter Indications: Assessment of intravascular volume, Drug and/or fluid administration and Frequent blood sampling  Procedure Details Consent: Unable to obtain consent because of altered level of consciousness. Time Out: Verified patient identification, verified procedure, site/side was marked, verified correct patient position, special equipment/implants available, medications/allergies/relevent history reviewed, required imaging and test results available.  Performed  Maximum sterile technique was used including antiseptics, cap, gloves, gown, hand hygiene, mask and sheet. Skin prep: Chlorhexidine; local anesthetic administered A antimicrobial bonded/coated triple lumen catheter was placed in the right internal jugular vein using the Seldinger technique.  Evaluation Blood flow good Complications: No apparent complications Patient did tolerate procedure well. Chest X-ray ordered to verify placement.  CXR: pending.  Procedure performed under direct ultrasound guidance for real time vessel cannulation.      Zachary Mcmahon, [facility] Pager: [phone]  or [phone] 02/11/2015, 3:05 AM

## 2015-02-11 NOTE — Progress Notes (Signed)
ANTICOAGULATION CONSULT NOTE - Follow Up Consult  Pharmacy Consult for Heparin Indication: 3V CAD  Allergies  Allergen Reactions  . Bactrim [Sulfamethoxazole-Trimethoprim] Rash    Patient Measurements: Height: 5\' 9"  (175.3 cm) Weight: 207 lb 8 oz (94.121 kg) IBW/kg (Calculated) : 70.7 Heparin Dosing Weight:    Vital Signs: Temp: 101.1 F (38.4 C) (06/22 0700) Temp Source: Core (Comment) (06/22 0400) BP: 119/44 mmHg (06/22 0744) Pulse Rate: 68 (06/22 0744)  Labs:  Recent Labs  02/09/15 0501 02/09/15 0855  02/10/15 0440 02/10/15 1741 02/11/15 0032 02/11/15 0840  HGB 10.4*  --   --  9.8*  --  12.9* 11.2*  HCT 32.3*  --   --  30.6*  --  39.5 34.1*  PLT 140*  --   --  145*  --  168 160  LABPROT  --  15.0  --   --   --   --   --   INR  --  1.16  --   --   --   --   --   HEPARINUNFRC 0.49  --   < > 0.33  --  0.20* 0.25*  CREATININE 1.88*  --   --  1.74*  --  1.94*  --   TROPONINI  --   --   --   --  0.48* 0.66*  --   < > = values in this interval not displayed.  Estimated Creatinine Clearance: 34.4 mL/min (by C-G formula based on Cr of 1.94).   Assessment: 81 y.o. M presented to [facility] with [facility] and transferred to [facility]. He is now s/p cath with multivessel CAD and on heparin. Heparin level is 0.25 and below goal. Noted that CABG has been delayed with renal function and fever.   Goal of Therapy:  Heparin level 0.3-0.7 units/ml Monitor platelets by anticoagulation protocol: Yes   Plan:  -Increase heparin to 1750 units/hr -Heparin level in 8 hrs -Daily heparin level and CBC  Hildred Laser, Pharm D 02/11/2015 9:43 AM

## 2015-02-11 NOTE — Progress Notes (Signed)
ANTIBIOTIC CONSULT NOTE - INITIAL  Pharmacy Consult for zosyn, vancomycin Indication: PNA  Allergies  Allergen Reactions  . Bactrim [Sulfamethoxazole-Trimethoprim] Rash    Patient Measurements: Height: 5\' 9"  (175.3 cm) Weight: 207 lb 8 oz (94.121 kg) IBW/kg (Calculated) : 70.7   Vital Signs: Temp: 99.7 F (37.6 C) (06/22 1000) Temp Source: Core (Comment) (06/22 0800) BP: 126/50 mmHg (06/22 1000) Pulse Rate: 70 (06/22 1000) Intake/Output from previous day: 06/21 0701 - 06/22 0700 In: 1299.2 [P.O.:480; I.V.:669.2; NG/GT:150] Out: 2300 [Urine:2275; Emesis/NG output:25] Intake/Output from this shift: Total I/O In: -  Out: 102 [Urine:102]  Labs:  Recent Labs  02/10/15 0440 02/11/15 0032 02/11/15 0840  WBC 8.1 13.1* 17.2*  HGB 9.8* 12.9* 11.2*  PLT 145* 168 160  CREATININE 1.74* 1.94* 2.54*   Estimated Creatinine Clearance: 26.3 mL/min (by C-G formula based on Cr of 2.54). No results for input(s): VANCOTROUGH, VANCOPEAK, VANCORANDOM, GENTTROUGH, GENTPEAK, GENTRANDOM, TOBRATROUGH, TOBRAPEAK, TOBRARND, AMIKACINPEAK, AMIKACINTROU, AMIKACIN in the last 72 hours.   Microbiology: Recent Results (from the past 720 hour(s))  MRSA PCR Screening     Status: None   Collection Time: 02/06/15 11:45 PM  Result Value Ref Range Status   MRSA by PCR NEGATIVE NEGATIVE Final    Comment:        The GeneXpert MRSA Assay (FDA approved for NASAL specimens only), is one component of a comprehensive MRSA colonization surveillance program. It is not intended to diagnose MRSA infection nor to guide or monitor treatment for MRSA infections.   Surgical pcr screen     Status: None   Collection Time: 02/10/15 11:32 PM  Result Value Ref Range Status   MRSA, PCR NEGATIVE NEGATIVE Final   Staphylococcus aureus NEGATIVE NEGATIVE Final    Comment:        The Xpert SA Assay (FDA approved for NASAL specimens in patients over 19 years of age), is one component of a comprehensive  surveillance program.  Test performance has been validated by [employer] for patients greater than or equal to 66 year old. It is not intended to diagnose infection nor to guide or monitor treatment.     Medical History: Past Medical History  Diagnosis Date  . Hypertension   . High potassium   . Varicose veins   . Hyperlipidemia   . Basal cell carcinoma of forehead 2016 X 2  . Basal cell carcinoma of left earlobe   . Old myocardial infarct     "sometime in the past; don't know when" (02/07/2015)  . Type II diabetes mellitus   . Pneumonia X 1  . GERD (gastroesophageal reflux disease)   . Arthritis     "legs" (02/07/2015)  . History of gout X 1  . Diabetic peripheral neuropathy     "left foot" (02/07/2015)    Medications:  Scheduled:  . antiseptic oral rinse  7 mL Mouth Rinse QID  . aspirin  81 mg Per Tube Daily  . atorvastatin  20 mg Oral q1800  . chlorhexidine  15 mL Mouth Rinse BID  . cloNIDine  0.2 mg Per Tube BID  . gabapentin  100 mg Per Tube 3 times per day  . insulin aspart  0-15 Units Subcutaneous TID WC  . insulin aspart  0-5 Units Subcutaneous QHS  . insulin detemir  36 Units Subcutaneous QHS  . labetalol  300 mg Oral BID  . pantoprazole (PROTONIX) IV  40 mg Intravenous Q12H  . sodium chloride  3 mL Intravenous Q12H  Assessment: 81 yo male with NSTEMI s/p cath and now noted with fever and leukocytosis. Spoke with Dr. Elsworth Soho and will begin zosyn and vancomycin. WBC= 17.2 (trend up), tmax= 102.6, SCr= 2.54 (trend up) and CrCl ~ 25. CXR on 6/22 also shows concern for worsening pulmonary edema or multifocal pneumonia.  6/22 zosyn>> 6/22 vanc>>  6/22 urine 6/22 blood x2  Goal of Therapy:  Vancomycin trough level 15-20 mcg/ml  Plan:  -zosyn 3.375gm IV q8h -Vancomycin 1750mg  IV x1 followed by 1000mg  IV q48hr -Will follow renal function, cultures and clinical progress  Hildred Laser, Pharm D 02/11/2015 10:36 AM

## 2015-02-11 NOTE — Progress Notes (Signed)
ANTICOAGULATION CONSULT NOTE - Follow Up Consult  Pharmacy Consult for Heparin Indication: 3V CAD  Allergies  Allergen Reactions  . Bactrim [Sulfamethoxazole-Trimethoprim] Rash    Patient Measurements: Height: 5\' 9"  (175.3 cm) Weight: 207 lb 8 oz (94.121 kg) IBW/kg (Calculated) : 70.7 Heparin Dosing Weight:    Vital Signs: Temp: 100.2 F (37.9 C) (06/22 1900) Temp Source: Core (Comment) (06/22 1600) BP: 158/52 mmHg (06/22 1900) Pulse Rate: 77 (06/22 1900)  Labs:  Recent Labs  02/09/15 0855  02/10/15 0440 02/10/15 1741 02/11/15 0032 02/11/15 0840 02/11/15 1830  HGB  --   --  9.8*  --  12.9* 11.2*  --   HCT  --   --  30.6*  --  39.5 34.1*  --   PLT  --   --  145*  --  168 160  --   LABPROT 15.0  --   --   --   --   --   --   INR 1.16  --   --   --   --   --   --   HEPARINUNFRC  --   < > 0.33  --  0.20* 0.25* 0.34  CREATININE  --   --  1.74*  --  1.94* 2.54*  --   TROPONINI  --   --   --  0.48* 0.66* 5.75*  --   < > = values in this interval not displayed.  Estimated Creatinine Clearance: 26.3 mL/min (by C-G formula based on Cr of 2.54).   Assessment: 81 y.o. M presented to [facility] with CP and transferred to [facility]. He is now s/p cath with multivessel CAD and on heparin. Heparin level is 0.25 and below goal. Noted that CABG has been delayed with renal function and fever.   F/u HL is therapeutic at 0.34 on heparin 1750 units/hr. No issues with infusion or bleeding noted.  Goal of Therapy:  Heparin level 0.3-0.7 units/ml Monitor platelets by anticoagulation protocol: Yes   Plan:  -Continue heparin to 1750 units/hr -Daily heparin level and CBC -Monitor s/sx of bleeding  Andrey Cota. Diona Foley, PharmD Clinical Pharmacist Pager [phone]  02/11/2015 7:25 PM

## 2015-02-11 NOTE — Progress Notes (Addendum)
       Patient Name: Zachary Mcmahon Date of Encounter: 02/11/2015    SUBJECTIVE: The patient developed acute pulmonary edema yesterday in the process of having noninvasive studies performed for planned coronary bypass surgery. He ultimately required intubation. Also developed a temperature spike after multiple instrumentation including Foley catheter insertion and intubation.  TELEMETRY:  Normal sinus rhythm Filed Vitals:   02/11/15 1000 02/11/15 1026 02/11/15 1100 02/11/15 1131  BP: 126/50 126/50 126/41 144/52  Pulse: 70 71 64 61  Temp: 99.7 F (37.6 C)  99.3 F (37.4 C)   TempSrc:      Resp: 18 18 10 16   Height:      Weight:      SpO2: 100% 100% 100% 100%    Intake/Output Summary (Last 24 hours) at 02/11/15 1140 Last data filed at 02/11/15 1100  Gross per 24 hour  Intake 1249.72 ml  Output   2402 ml  Net -1152.28 ml   LABS: Basic Metabolic Panel:  Recent Labs  02/11/15 0032 02/11/15 0840  NA 137 138  K 4.3 4.4  CL 103 104  CO2 22 25  GLUCOSE 239* 225*  BUN 32* 36*  CREATININE 1.94* 2.54*  CALCIUM 9.4 9.8   CBC:  Recent Labs  02/11/15 0032 02/11/15 0840  WBC 13.1* 17.2*  HGB 12.9* 11.2*  HCT 39.5 34.1*  MCV 91.6 90.9  PLT 168 160   Cardiac Enzymes:  Recent Labs  02/10/15 1741 02/11/15 0032 02/11/15 0840  TROPONINI 0.48* 0.66* 5.75*   EKG: Normal sinus rhythm, preserved anterior R wave forces. Precordial T-wave inversion.  Radiology/Studies:  Chest x-ray reveals worsening right mid lung and by basilar airspace disease compatible with CHF but can't exclude superimposed pneumonia.  Physical Exam: Blood pressure 144/52, pulse 61, temperature 99.3 F (37.4 C), temperature source Core (Comment), resp. rate 16, height 5\' 9"  (1.753 m), weight 94.121 kg (207 lb 8 oz), SpO2 100 %. Weight change: -0.68 kg (-1 lb 8 oz)  Wt Readings from Last 3 Encounters:  02/11/15 94.121 kg (207 lb 8 oz)  04/23/14 96.163 kg (212 lb)  10/23/12 93.441 kg (206 lb)    Intubated Unable to assess neck veins  Chest with rhonchi anteriorly. Abdomen is soft Extremities reveal no edema Neurological exam is difficult to assess because of sedation   ASSESSMENT:  1. Acute on chronic diastolic heart failure, ischemically mediated. [place] luminary edema developed. 2. Acute non-ST elevation myocardial infarction based upon troponin rise. No evidence of ST elevation/Q-wave infarction 3. Acute on chronic kidney injury. Etiology of kidney injury is unknown. Given the level of hypertension that developed yesterday, the possibility of renal artery stenosis should be considered. 4. Temperature spike, uncertain cause 5. Acute hypercarbic respiratory failure requiring intubation  6. Severe hypertension, rule out renal artery stenosis    Plan:  1. IV heparin  2. Watch for development of atrial arrhythmias. Would be quick to use amiodarone to suppress any tachycardia arrhythmias 3. Consider renal ultrasound/duplex to assess kidney impairment and exclude renal artery stenosis 4. Continue aggressive diuresis 5. IV antibody as needed for possible hospital-acquired infection 6. Time of surgery will now dependent upon stabilization of the patient's pulmonary edema, treatment of possible infection, and kidney function. 7. We'll repeat echocardiogram to determine current EF    Signed, SELASSIE, SANTOPIETRO 02/11/2015, 11:40 AM

## 2015-02-11 NOTE — Progress Notes (Signed)
Patient ID: Zachary Mcmahon, male   DOB: Apr 16, 1932, 81 y.o.   MRN: [id number] TCTS DAILY ICU PROGRESS NOTE                   [address]            [place] [address]          [phone]   2 Days Post-Op Procedure(s) (LRB): Left Heart Cath and Coronary Angiography (N/A)  Total Length of Stay:  LOS: 5 days   Subjective: Now sedated on ventilator , temp over 102 last pm , now with ice packs to cool  Objective: Vital signs in last 24 hours: Temp:  [97.5 F (36.4 C)-102.6 F (39.2 C)] 101.1 F (38.4 C) (06/22 0700) Pulse Rate:  [65-121] 71 (06/22 0700) Cardiac Rhythm:  [-] Normal sinus rhythm (06/22 0400) Resp:  [17-36] 18 (06/22 0700) BP: (74-214)/(34-163) 112/41 mmHg (06/22 0700) SpO2:  [59 %-100 %] 99 % (06/22 0700) FiO2 (%):  [40 %-100 %] 40 % (06/22 0353) Weight:  [207 lb 8 oz (94.121 kg)] 207 lb 8 oz (94.121 kg) (06/22 0341)  Filed Weights   02/09/15 0500 02/10/15 0400 02/11/15 0341  Weight: 201 lb (91.173 kg) 209 lb (94.802 kg) 207 lb 8 oz (94.121 kg)    Weight change: -1 lb 8 oz (-0.68 kg)   Hemodynamic parameters for last 24 hours:    Intake/Output from previous day: 06/21 0701 - 06/22 0700 In: 1299.2 [P.O.:480; I.V.:669.2; NG/GT:150] Out: 2300 [Urine:2275; Emesis/NG output:25]  Intake/Output this shift:    Current Meds: Scheduled Meds: . aminocaproic acid (AMICAR) for OHS   Intravenous To OR  . antiseptic oral rinse  7 mL Mouth Rinse QID  . aspirin  81 mg Per Tube Daily  . atorvastatin  20 mg Oral q1800  . bisacodyl  5 mg Oral Once  . cefUROXime (ZINACEF)  IV  1.5 g Intravenous To OR  . cefUROXime (ZINACEF)  IV  750 mg Intravenous To OR  . chlorhexidine  15 mL Mouth Rinse BID  . cloNIDine  0.2 mg Per Tube BID  . dexmedetomidine  0.1-0.7 mcg/kg/hr Intravenous To OR  . DOPamine  0-10 mcg/kg/min Intravenous To OR  . epinephrine  0-10 mcg/min Intravenous To OR  . gabapentin  100 mg Per Tube 3 times per day  .  heparin-papaverine-plasmalyte irrigation   Irrigation To OR  . heparin 30,000 units/NS 1000 mL solution for CELLSAVER   Other To OR  . insulin aspart  0-15 Units Subcutaneous TID WC  . insulin aspart  0-5 Units Subcutaneous QHS  . insulin detemir  36 Units Subcutaneous QHS  . insulin (NOVOLIN-R) infusion   Intravenous To OR  . labetalol  300 mg Oral BID  . lidocaine (cardiac) 100 mg/78ml      . magnesium sulfate  40 mEq Other To OR  . metoprolol tartrate  12.5 mg Oral Once  . nitroGLYCERIN  2-200 mcg/min Intravenous To OR  . pantoprazole (PROTONIX) IV  40 mg Intravenous Q12H  . phenylephrine (NEO-SYNEPHRINE) Adult infusion  30-200 mcg/min Intravenous To OR  . potassium chloride  80 mEq Other To OR  . rocuronium      . sodium chloride  3 mL Intravenous Q12H  . succinylcholine      . vancomycin  1,500 mg Intravenous To OR   Continuous Infusions: . fentaNYL infusion INTRAVENOUS 75 mcg/hr (02/11/15 0230)  . heparin 1,600 Units/hr (02/11/15 0305)  . nitroGLYCERIN Stopped (02/11/15 0145)  .  phenylephrine (NEO-SYNEPHRINE) Adult infusion 75 mcg/min (02/11/15 0610)   PRN Meds:.sodium chloride, acetaminophen **OR** acetaminophen (TYLENOL) oral liquid 160 mg/5 mL, alum & mag hydroxide-simeth, fentaNYL, hydrALAZINE, metoprolol, midazolam, midazolam, morphine injection, ondansetron (ZOFRAN) IV, sodium chloride  General appearance: sedated on vent Neurologic: unable to acess  Heart: regular rate and rhythm, S1, S2 normal, no murmur, click, rub or gallop Lungs: diminished breath sounds bilaterally Abdomen: mild distention Extremities: edema left greater then right ankle edema  Lab Results: CBC: Recent Labs  02/10/15 0440 02/11/15 0032  WBC 8.1 13.1*  HGB 9.8* 12.9*  HCT 30.6* 39.5  PLT 145* 168   BMET:  Recent Labs  02/10/15 0440 02/11/15 0032  NA 137 137  K 3.8 4.3  CL 106 103  CO2 26 22  GLUCOSE 222* 239*  BUN 27* 32*  CREATININE 1.74* 1.94*  CALCIUM 9.1 9.4    PT/INR:    Recent Labs  02/09/15 0855  LABPROT 15.0  INR 1.16   Lab Results  Component Value Date   TROPONINI 0.66* 02/11/2015   Lactic Acid, Venous elevated 1.6 Prolactin .47  Radiology: US Renal  02/10/2015   CLINICAL DATA:  Chronic kidney disease.  EXAM: RENAL / URINARY TRACT ULTRASOUND COMPLETE  COMPARISON:  None.  FINDINGS: Right Kidney:  Length: 12.6 cm. Cortical thinning. Diffusely increased echotexture. No focal abnormality or hydronephrosis.  Left Kidney:  Length: 12.3 cm. Cortical thinning. Diffusely increased echotexture. Multiple cysts, the largest in the lower pole measuring 4.4 cm. These appear simple. No hydronephrosis.  Bladder:  Appears normal for degree of bladder distention.  IMPRESSION: Cortical thinning with increased echotexture bilaterally. Findings suggestive of chronic medical renal disease. No hydronephrosis.   Electronically Signed   By: Rolm Baptise M.D.   On: 02/10/2015 09:33   Dg Chest Port 1 View  02/11/2015   CLINICAL DATA:  Central line placement.  Initial encounter.  EXAM: PORTABLE CHEST - 1 VIEW  COMPARISON:  Chest radiograph performed 02/10/2015  FINDINGS: The patient's endotracheal tube is seen ending 4 cm above the carina. A right IJ line is noted ending about the mid SVC. An enteric tube is noted extending below the diaphragm.  Worsening bibasilar and right mid lung airspace opacities raise concern for worsening pulmonary edema or multifocal pneumonia. No definite pleural effusion or pneumothorax is seen.  The cardiomediastinal silhouette is borderline normal in size. No acute osseous abnormalities are identified. An external pacing pad is noted.  IMPRESSION: 1. Endotracheal tube seen ending 4 cm above the carina. 2. Right IJ line noted ending about the mid SVC. 3. Worsening bibasilar and right mid lung zone airspace opacities raise concern for worsening pulmonary edema or multifocal pneumonia.   Electronically Signed   By: Garald Balding M.D.   On: 02/11/2015 03:46       Assessment/Plan: S/P Procedure(s) (LRB): Left Heart Cath and Coronary Angiography (N/A) Acute respiratory failure  Fever to 102 with elevated lactate suspicious for sepsis Cr elevated 0.02 higher then yesterday- ckd monitor for acute injury With current situation will not proceed with CABG at this time Critical Care managing potential sepsis    [name] 02/11/2015 7:19 AM

## 2015-02-11 NOTE — Progress Notes (Signed)
Initial Nutrition Assessment  INTERVENTION:   (If pt remains intubated recommend starting enteral nutrition support)  Recommend:  Initiate Osmolite 1.5 @ 25 ml/hr via OG tube and increase by 10 ml every 4 hours to goal rate of 45 ml/hr.   60 ml Prostat BID.    Tube feeding regimen provides 2025 kcal (95% of needs), 127 grams of protein, and 822 ml of H2O.   NUTRITION DIAGNOSIS:  Inadequate oral intake related to inability to eat as evidenced by NPO status.   GOAL:  Patient will meet greater than or equal to 90% of their needs   MONITOR:  Vent status, Labs, I & O's  REASON FOR ASSESSMENT:  Ventilator    ASSESSMENT:  81 y.o with severe Htn, CKD, severe 3V CAD admitted6/17 with nstemI, transferred to ICU for flash pulmonary edema 6/20, on BiPAP pt vomited and failed then intubated.  Fever >102, ice packs placed. Per TCTS no plan for CABG at this time.   Patient is currently intubated on ventilator support. Pt has OG tube.  MV: 9.6 L/min Temp (24hrs), Avg:100.1 F (37.8 C), Min:97.5 F (36.4 C), Max:102.6 F (39.2 C)  Labs reviewed: BUN/Cr elevated Medications reviewed and include: KCl, levemir, novolog Nutrition-Focused physical exam completed. Findings are no fat depletion, no muscle depletion, and no edema.    Height:  Ht Readings from Last 1 Encounters:  02/07/15 5\' 9"  (1.753 m)    Weight:  Wt Readings from Last 1 Encounters:  02/11/15 207 lb 8 oz (94.121 kg)  Admission BMI: 29.9  Ideal Body Weight:  72.7 kg  Wt Readings from Last 10 Encounters:  02/11/15 207 lb 8 oz (94.121 kg)  04/23/14 212 lb (96.163 kg)  10/23/12 206 lb (93.441 kg)  07/11/12 199 lb 8 oz (90.493 kg)  03/21/12 233 lb 14.4 oz (106.096 kg)    BMI:  Body mass index is 30.63 kg/(m^2).  Estimated Nutritional Needs:  Kcal:  2129  Protein:  115-130 grams  Fluid:  >1.5 L/day  Skin:  Reviewed, no issues  Diet Order:  Diet NPO time specified  EDUCATION NEEDS:  No  education needs identified at this time   Intake/Output Summary (Last 24 hours) at 02/11/15 1117 Last data filed at 02/11/15 1100  Gross per 24 hour  Intake 1249.72 ml  Output   2402 ml  Net -1152.28 ml    Last BM:  6/17  Zachary Mcmahon RD, [place], [place] Pager [phone] After Hours Pager

## 2015-02-11 NOTE — Progress Notes (Signed)
[facility] Progress Note Patient Name: Zachary Mcmahon DOB: 09/25/1931 MRN: [id number]   Date of Service  02/11/2015  HPI/Events of Note  Persistent hypotension despite d/c of NTG and reduction in sedation.  Current BP of 75/39.    eICU Interventions  Plan: Start NEO gtt for BP support CVL to be placed F/U on lactate/PCT     Intervention Category Intermediate Interventions: Hypotension - evaluation and management  Fedor Kazmierski 02/11/2015, 2:32 AM

## 2015-02-11 NOTE — Progress Notes (Signed)
Notified MD Dr. Jimmy Footman of patients episode of HR 120's, lungs have coarse crackles throughout and o2 sats dropped 90's on vent, pt temp 100.2.  Pt was suctioned HR 100-110's, sats 94-97% on vent. No new orders at this time. Will continue to monitor.

## 2015-02-11 NOTE — Progress Notes (Signed)
PULMONARY / CRITICAL CARE MEDICINE   Name: Zachary Mcmahon MRN: [id number] DOB: [birth year]    ADMISSION DATE:  02/06/2015 CONSULTATION DATE:  02/11/2015  REFERRING MD :  Clementeen Graham MD  CHIEF COMPLAINT:  resp distress  INITIAL PRESENTATION: 81 y.o with severe Htn, CKD, severe 3V CAD admitted6/17 with nstemI, transferred to ICU for flash pulmonary edema 6/20, on BiPAP  STUDIES:  2-D echo 6/18 >> EF of 45 -50% with akinesis of the mid apical inferior septal myocardium. Also shows grade 1 diastolic dysfunction. PFTs 6/21 >> no obs, DLCO 60% Cath >> Severe 3 vessel CAD with totally occluded RCA and severe LAD and RCA  SIGNIFICANT EVENTS: 6/21 bipap >> ETT   SUBJECTIVE:  Intubated last night Febrile overnight, not currently  No secretions.   VITAL SIGNS: Temp:  [97.5 F (36.4 C)-102.6 F (39.2 C)] 98.6 F (37 C) (06/22 1400) Pulse Rate:  [61-121] 65 (06/22 1400) Resp:  [10-36] 16 (06/22 1400) BP: (74-214)/(34-163) 133/45 mmHg (06/22 1400) SpO2:  [59 %-100 %] 99 % (06/22 1400) FiO2 (%):  [40 %-100 %] 40 % (06/22 1200) Weight:  [94.121 kg (207 lb 8 oz)] 94.121 kg (207 lb 8 oz) (06/22 0341) HEMODYNAMICS:   VENTILATOR SETTINGS: Vent Mode:  [-] PSV;PRVC FiO2 (%):  [40 %-100 %] 40 % Set Rate:  [10 bmp-18 bmp] 18 bmp Vt Set:  [560 mL] 560 mL PEEP:  [5 cmH20] 5 cmH20 Pressure Support:  [8 cmH20] 8 cmH20 Plateau Pressure:  [17 cmH20-21 cmH20] 19 cmH20 INTAKE / OUTPUT:  Intake/Output Summary (Last 24 hours) at 02/11/15 1450 Last data filed at 02/11/15 1300  Gross per 24 hour  Intake 1642.25 ml  Output   2472 ml  Net -829.75 ml    PHYSICAL EXAMINATION: Gen. Appears stated age, well-nourished, in no distress, normal affect ENT - no lesions, no post nasal drip, ETT in place Neck:  JVD +, no thyromegaly, no carotid bruits Lungs:  use of accessory muscle+s, no dullness to percussion,BL scattered rales , no rhonchi  Cardiovascular: Rhythm regular, heart sounds  normal, no murmurs,  1+ peripheral edema Musculoskeletal: No deformities, no cyanosis or clubbing Neuro:  alert, non focal, follows commands Skin:  Warm, no lesions/ rash   LABS:  CBC  Recent Labs Lab 02/10/15 0440 02/11/15 0032 02/11/15 0840  WBC 8.1 13.1* 17.2*  HGB 9.8* 12.9* 11.2*  HCT 30.6* 39.5 34.1*  PLT 145* 168 160   Coag's  Recent Labs Lab 02/06/15 1612 02/09/15 0855  APTT 39*  --   INR 1.16 1.16   BMET  Recent Labs Lab 02/10/15 0440 02/11/15 0032 02/11/15 0840  NA 137 137 138  K 3.8 4.3 4.4  CL 106 103 104  CO2 26 22 25   BUN 27* 32* 36*  CREATININE 1.74* 1.94* 2.54*  GLUCOSE 222* 239* 225*   Electrolytes  Recent Labs Lab 02/07/15 0047  02/10/15 0440 02/11/15 0032 02/11/15 0840  CALCIUM  --   < > 9.1 9.4 9.8  MG 1.7  --   --   --   --   < > = values in this interval not displayed. Sepsis Markers  Recent Labs Lab 02/06/15 2147 02/11/15 0140 02/11/15 0530  LATICACIDVEN 0.8 1.9 1.6  PROCALCITON  --  0.58  --    ABG  Recent Labs Lab 02/10/15 1831 02/10/15 2046  PHART 7.168* 7.325*  PCO2ART 76.5* 45.0  PO2ART 350.0* 140.0*   Liver Enzymes  Recent Labs Lab 02/06/15 1611 02/10/15 0440  AST 13*  16  ALT 10* 12*  ALKPHOS 91 69  BILITOT 0.9 0.8  ALBUMIN 4.0 2.9*   Cardiac Enzymes  Recent Labs Lab 02/10/15 1741 02/11/15 0032 02/11/15 0840  TROPONINI 0.48* 0.66* 5.75*   Glucose  Recent Labs Lab 02/09/15 1642 02/09/15 2050 02/10/15 0951 02/10/15 1139 02/10/15 1714 02/10/15 2133  GLUCAP 203* 164* 137* 172* 179* 236*    Imaging Dg Chest Port 1 View  02/11/2015   CLINICAL DATA:  Central line placement.  Initial encounter.  EXAM: PORTABLE CHEST - 1 VIEW  COMPARISON:  Chest radiograph performed 02/10/2015  FINDINGS: The patient's endotracheal tube is seen ending 4 cm above the carina. A right IJ line is noted ending about the mid SVC. An enteric tube is noted extending below the diaphragm.  Worsening bibasilar and right mid lung  airspace opacities raise concern for worsening pulmonary edema or multifocal pneumonia. No definite pleural effusion or pneumothorax is seen.  The cardiomediastinal silhouette is borderline normal in size. No acute osseous abnormalities are identified. An external pacing pad is noted.  IMPRESSION: 1. Endotracheal tube seen ending 4 cm above the carina. 2. Right IJ line noted ending about the mid SVC. 3. Worsening bibasilar and right mid lung zone airspace opacities raise concern for worsening pulmonary edema or multifocal pneumonia.   Electronically Signed   By: Garald Balding M.D.   On: 02/11/2015 03:46   Portable Chest Xray  02/10/2015   CLINICAL DATA:  Acute respiratory failure  EXAM: PORTABLE CHEST - 1 VIEW  COMPARISON:  02/10/2015  FINDINGS: ET tube tip is above the carina. The heart size appears normal. Nasogastric tube tip is in the stomach. Pulmonary edema pattern is improved from previous exam. Persistent lower lobe interstitial and airspace opacities identified.  IMPRESSION: 1. Interval improvement in pulmonary edema pattern.   Electronically Signed   By: Kerby Moors M.D.   On: 02/10/2015 21:05   Dg Chest Port 1 View  02/10/2015   CLINICAL DATA:  Sudden onset shortness of breath and respiratory distress  EXAM: PORTABLE CHEST - 1 VIEW  COMPARISON:  The 02/06/2015  FINDINGS: Symmetric alveolar and interstitial opacities. There could be small pleural effusions, limited at the right base due to exclusion from view.  Stable mild cardiomegaly. Vascular pedicle widening, increased from previous.  IMPRESSION: CHF with widespread alveolar edema.   Electronically Signed   By: Monte Fantasia M.D.   On: 02/10/2015 18:19     ASSESSMENT / PLAN:  PULMONARY OETT  A:Acute hypoxic and hypercarbic resp failure due to flash pulm edema P:   Tolerating PSV now, assessing for possible extubation CXR now to reassess infiltrates - suspect that these are largely edema but consider also   CARDIOVASCULAR  A:  Acute pulm edema 3V CAD with acute NSTEMI (? primary event vs stress related) Acute systolic CHF Hypertensive emergency Possible HCAP  P:  NTG gtt off since intubation, diuretics and sedation Use hydralazine & lopressor IV prn IV heparin gtt Following troponin for peak CABG on hold until we are comfortable that he does not have active infxn  RENAL A:  Acute on CKD P:   Monitor cr & lytes, replete as needed Expect cr to rise  GASTROINTESTINAL A:  Nausea/ vomiting P:   Zofran npo  HEMATOLOGIC A:  Anemia  P:  Transfuse per ICU criteria  INFECTIOUS Blood Cx 6/22 >>  Resp 6/22 >>   Vanco 6/22 >>  Zosyn 6/22 >>  A:  B infiltrates, consider possible PNA. No  overt clinical signs P:   Empiric vanco and zosyn started 6/22  ENDOCRINE A:  DM - on insulin   P:   SSI  NEUROLOGIC A:  No issues P:   RASS goal: 0 to -1   FAMILY  - Updates: Pt, wife and her caregiver on 6/22   TODAY'S SUMMARY: Acute pulmonary edema, he may be a candidate for CABG. Less likely but consider possible HCAP. Tolerating SBT. Consider extubation 6/22 or 23  Independent CC time 40 minutes.   Baltazar Apo, MD, PhD 02/11/2015, 3:15 PM  Pulmonary and Critical Care [phone] or if no answer [phone]

## 2015-02-12 ENCOUNTER — Inpatient Hospital Stay (HOSPITAL_COMMUNITY): Payer: Medicare Other

## 2015-02-12 DIAGNOSIS — J9601 Acute respiratory failure with hypoxia: Secondary | ICD-10-CM | POA: Diagnosis present

## 2015-02-12 LAB — GLUCOSE, CAPILLARY
GLUCOSE-CAPILLARY: 113 mg/dL — AB (ref 65–99)
GLUCOSE-CAPILLARY: 123 mg/dL — AB (ref 65–99)
GLUCOSE-CAPILLARY: 180 mg/dL — AB (ref 65–99)
GLUCOSE-CAPILLARY: 199 mg/dL — AB (ref 65–99)
Glucose-Capillary: 112 mg/dL — ABNORMAL HIGH (ref 65–99)
Glucose-Capillary: 139 mg/dL — ABNORMAL HIGH (ref 65–99)
Glucose-Capillary: 172 mg/dL — ABNORMAL HIGH (ref 65–99)

## 2015-02-12 LAB — PHOSPHORUS: PHOSPHORUS: 2.9 mg/dL (ref 2.5–4.6)

## 2015-02-12 LAB — BASIC METABOLIC PANEL
ANION GAP: 9 (ref 5–15)
BUN: 39 mg/dL — AB (ref 6–20)
CO2: 24 mmol/L (ref 22–32)
Calcium: 9.6 mg/dL (ref 8.9–10.3)
Chloride: 105 mmol/L (ref 101–111)
Creatinine, Ser: 2.41 mg/dL — ABNORMAL HIGH (ref 0.61–1.24)
GFR calc non Af Amer: 24 mL/min — ABNORMAL LOW (ref >60)
GFR, EST AFRICAN AMERICAN: 28 mL/min — AB (ref >60)
GLUCOSE: 123 mg/dL — AB (ref 65–99)
Potassium: 3.8 mmol/L (ref 3.5–5.1)
Sodium: 138 mmol/L (ref 135–145)

## 2015-02-12 LAB — CBC
HCT: 32.5 % — ABNORMAL LOW (ref 39.0–52.0)
Hemoglobin: 10.6 g/dL — ABNORMAL LOW (ref 13.0–17.0)
MCH: 29.7 pg (ref 26.0–34.0)
MCHC: 32.6 g/dL (ref 30.0–36.0)
MCV: 91 fL (ref 78.0–100.0)
Platelets: 141 10*3/uL — ABNORMAL LOW (ref 150–400)
RBC: 3.57 MIL/uL — ABNORMAL LOW (ref 4.22–5.81)
RDW: 13.5 % (ref 11.5–15.5)
WBC: 11.8 10*3/uL — ABNORMAL HIGH (ref 4.0–10.5)

## 2015-02-12 LAB — HEPARIN LEVEL (UNFRACTIONATED)
HEPARIN UNFRACTIONATED: 0.23 [IU]/mL — AB (ref 0.30–0.70)
Heparin Unfractionated: 0.24 IU/mL — ABNORMAL LOW (ref 0.30–0.70)

## 2015-02-12 LAB — MAGNESIUM: MAGNESIUM: 1.7 mg/dL (ref 1.7–2.4)

## 2015-02-12 LAB — PROCALCITONIN: Procalcitonin: 4.54 ng/mL

## 2015-02-12 LAB — URINE CULTURE: Culture: NO GROWTH

## 2015-02-12 LAB — TROPONIN I: TROPONIN I: 4.98 ng/mL — AB (ref <0.031)

## 2015-02-12 MED ORDER — METOPROLOL TARTRATE 1 MG/ML IV SOLN: 5.0000 mg | Freq: Once | INTRAVENOUS | Status: DC

## 2015-02-12 MED ORDER — METOPROLOL TARTRATE 1 MG/ML IV SOLN
5.0000 mg | Freq: Once | INTRAVENOUS | Status: AC
  Administered 2015-02-12: 5 mg via INTRAVENOUS

## 2015-02-12 MED ORDER — METOPROLOL TARTRATE 1 MG/ML IV SOLN
5.0000 mg | INTRAVENOUS | Status: DC | PRN
  Administered 2015-02-12: 5 mg via INTRAVENOUS
  Filled 2015-02-12: qty 5

## 2015-02-12 MED ORDER — METOPROLOL TARTRATE 25 MG PO TABS
25.0000 mg | ORAL_TABLET | Freq: Two times a day (BID) | ORAL | Status: DC
Start: 2015-02-12 — End: 2015-02-12
  Administered 2015-02-12: 25 mg via ORAL
  Filled 2015-02-12: qty 1

## 2015-02-12 MED ORDER — METOPROLOL TARTRATE 1 MG/ML IV SOLN
INTRAVENOUS | Status: AC
  Filled 2015-02-12: qty 5

## 2015-02-12 MED ORDER — GABAPENTIN 100 MG PO CAPS
100.0000 mg | ORAL_CAPSULE | Freq: Three times a day (TID) | ORAL | Status: DC
  Administered 2015-02-12 – 2015-02-24 (×34): 100 mg via ORAL
  Filled 2015-02-12 (×39): qty 1

## 2015-02-12 MED ORDER — CLONIDINE HCL 0.3 MG PO TABS
0.3000 mg | ORAL_TABLET | Freq: Two times a day (BID) | ORAL | Status: DC
  Filled 2015-02-12 (×2): qty 1

## 2015-02-12 MED ORDER — NITROGLYCERIN IN D5W 200-5 MCG/ML-% IV SOLN
0.0000 ug/min | INTRAVENOUS | Status: DC
  Administered 2015-02-12: 10 ug/min via INTRAVENOUS
  Administered 2015-02-14 – 2015-02-16 (×2): 20 ug/min via INTRAVENOUS
  Administered 2015-02-17: 40 ug/min via INTRAVENOUS
  Filled 2015-02-12 (×5): qty 250

## 2015-02-12 MED ORDER — METOPROLOL TARTRATE 50 MG PO TABS
50.0000 mg | ORAL_TABLET | Freq: Two times a day (BID) | ORAL | Status: DC
  Administered 2015-02-12 – 2015-02-14 (×4): 50 mg via ORAL
  Filled 2015-02-12 (×5): qty 1

## 2015-02-12 MED ORDER — LOSARTAN POTASSIUM 50 MG PO TABS
50.0000 mg | ORAL_TABLET | Freq: Two times a day (BID) | ORAL | Status: DC
  Administered 2015-02-12 (×2): 50 mg via ORAL
  Filled 2015-02-12 (×4): qty 1

## 2015-02-12 MED ORDER — METOPROLOL TARTRATE 1 MG/ML IV SOLN
INTRAVENOUS | Status: AC
  Administered 2015-02-12: 5 mg
  Filled 2015-02-12: qty 5

## 2015-02-12 NOTE — Progress Notes (Signed)
PULMONARY / CRITICAL CARE MEDICINE   Name: Zachary Mcmahon MRN: [id number] DOB: 24-Jun-1932    ADMISSION DATE:  02/06/2015 CONSULTATION DATE:  02/12/2015  REFERRING MD :  Clementeen Graham MD  CHIEF COMPLAINT:  resp distress  INITIAL PRESENTATION: 81 y.o with severe Htn, CKD, severe 3V CAD admitted6/17 with nstemI, transferred to ICU for flash pulmonary edema 6/20, on BiPAP  STUDIES:  2-D echo 6/18 >> EF of 45 -50% with akinesis of the mid apical inferior septal myocardium. Also shows grade 1 diastolic dysfunction. PFTs 6/21 >> no obs, DLCO 60% Cath >> Severe 3 vessel CAD with totally occluded RCA and severe LAD and RCA  SIGNIFICANT EVENTS: 6/21 bipap >> ETT   SUBJECTIVE:  Tolerating PSV Has been hypertensive last 18h  VITAL SIGNS: Temp:  [98.6 F (37 C)-100.6 F (38.1 C)] 99.3 F (37.4 C) (06/23 1027) Pulse Rate:  [61-138] 138 (06/23 1027) Resp:  [10-29] 29 (06/23 1027) BP: (111-166)/(38-65) 165/58 mmHg (06/23 0845) SpO2:  [93 %-100 %] 93 % (06/23 1027) FiO2 (%):  [30 %-40 %] 30 % (06/23 0845) Weight:  [94.53 kg (208 lb 6.4 oz)] 94.53 kg (208 lb 6.4 oz) (06/23 0412) HEMODYNAMICS:   VENTILATOR SETTINGS: Vent Mode:  [-] CPAP;PSV FiO2 (%):  [30 %-40 %] 30 % Set Rate:  [18 bmp] 18 bmp Vt Set:  [560 mL] 560 mL PEEP:  [5 cmH20] 5 cmH20 Pressure Support:  [5 cmH20-8 cmH20] 5 cmH20 Plateau Pressure:  [16 cmH20-17 cmH20] 16 cmH20 INTAKE / OUTPUT:  Intake/Output Summary (Last 24 hours) at 02/12/15 1038 Last data filed at 02/12/15 0700  Gross per 24 hour  Intake 1297.33 ml  Output   1005 ml  Net 292.33 ml    PHYSICAL EXAMINATION: Gen. Appears stated age, well-nourished, in no distress, normal affect ENT - no lesions, no post nasal drip, ETT in place Neck:  JVD +, no thyromegaly, no carotid bruits Lungs:  use of accessory muscle+s, no dullness to percussion,BL scattered rales , no rhonchi  Cardiovascular: Rhythm regular, heart sounds  normal, no murmurs, 1+ peripheral  edema Musculoskeletal: No deformities, no cyanosis or clubbing Neuro:  alert, non focal, follows commands Skin:  Warm, no lesions/ rash   LABS:  CBC  Recent Labs Lab 02/11/15 0032 02/11/15 0840 02/12/15 0355  WBC 13.1* 17.2* 11.8*  HGB 12.9* 11.2* 10.6*  HCT 39.5 34.1* 32.5*  PLT 168 160 141*   Coag's  Recent Labs Lab 02/06/15 1612 02/09/15 0855  APTT 39*  --   INR 1.16 1.16   BMET  Recent Labs Lab 02/11/15 0032 02/11/15 0840 02/12/15 0355  NA 137 138 138  K 4.3 4.4 3.8  CL 103 104 105  CO2 22 25 24   BUN 32* 36* 39*  CREATININE 1.94* 2.54* 2.41*  GLUCOSE 239* 225* 123*   Electrolytes  Recent Labs Lab 02/07/15 0047  02/11/15 0032 02/11/15 0840 02/12/15 0355  CALCIUM  --   < > 9.4 9.8 9.6  MG 1.7  --   --   --  1.7  PHOS  --   --   --   --  2.9  < > = values in this interval not displayed. Sepsis Markers  Recent Labs Lab 02/06/15 2147 02/11/15 0140 02/11/15 0530  LATICACIDVEN 0.8 1.9 1.6  PROCALCITON  --  0.58  --    ABG  Recent Labs Lab 02/10/15 1831 02/10/15 2046  PHART 7.168* 7.325*  PCO2ART 76.5* 45.0  PO2ART 350.0* 140.0*   Liver Enzymes  Recent  Labs Lab 02/06/15 1611 02/10/15 0440  AST 13* 16  ALT 10* 12*  ALKPHOS 91 69  BILITOT 0.9 0.8  ALBUMIN 4.0 2.9*   Cardiac Enzymes  Recent Labs Lab 02/11/15 0032 02/11/15 0840 02/12/15 0355  TROPONINI 0.66* 5.75* 4.98*   Glucose  Recent Labs Lab 02/11/15 1151 02/11/15 1553 02/11/15 1952 02/12/15 0014 02/12/15 0354 02/12/15 0716  GLUCAP 220* 148* 112* 113* 123* 139*    Imaging Dg Chest Port 1 View  02/12/2015   CLINICAL DATA:  Hypoxia  EXAM: PORTABLE CHEST - 1 VIEW  COMPARISON:  February 11, 2015  FINDINGS: Endotracheal tube tip is 4.6 cm above the carina. Nasogastric tube tip and side port are below the diaphragm. Central catheter tip is in the superior vena cava. No pneumothorax. There is patchy interstitial and alveolar opacity throughout the right mid and lower  lung zones, stable. There is patchy interstitial opacity in the left lower lobe. The left lung is otherwise clear. Heart is upper normal in size with pulmonary vascularity within normal limits. No adenopathy.  IMPRESSION: Areas of primarily interstitial opacity in both mid and lower lung zones with patchy airspace disease on the right in these areas. These areas appear stable compared to 1 day prior. No new opacity. No change in cardiac silhouette. Tube and catheter positions as described without pneumothorax. The appearance suggests a degree of congestive heart failure, although superimposed pneumonia may well be present as well.   Electronically Signed   By: Lowella Grip III M.D.   On: 02/12/2015 07:42   Dg Chest Port 1 View  02/11/2015   CLINICAL DATA:  Acute respiratory failure  EXAM: PORTABLE CHEST - 1 VIEW  COMPARISON:  02/11/2015  FINDINGS: Endotracheal tube in good position. NG tube in the stomach. Right jugular catheter tip in the SVC unchanged. No pneumothorax  Bilateral airspace disease with mild interval improvement. No significant effusion.  IMPRESSION: Support lines in good position.  Diffuse bilateral airspace disease with mild interval improvement.   Electronically Signed   By: Franchot Gallo M.D.   On: 02/11/2015 15:08   CXR from 6/23 reviewed personally by me. No real change in his basilar infiltrates.   ASSESSMENT / PLAN:  PULMONARY OETT 6/21 >> 6/23 A:Acute hypoxic and hypercarbic resp failure due to flash pulm edema P:   Tolerating PSV now, assessing for possible extubation CXR now to reassess infiltrates - suspect that these are largely edema but consider also   CARDIOVASCULAR  A: Acute pulm edema 3V CAD with acute NSTEMI (? primary event vs stress related) Acute systolic CHF Hypertensive emergency Possible HCAP  P:  Restart home clonidine and cozaar Consider restart home b-blocker once current episode CHF stabilized.  NTG gtt off since intubation, may need  restart Hydralazine & lopressor IV prn IV heparin gtt CABG on hold until we are comfortable that he does not have active infxn  RENAL A:  Acute on CKD P:   Monitor cr & lytes, replete as needed Expect cr to rise  GASTROINTESTINAL A:  Nausea/ vomiting P:   Zofran npo  HEMATOLOGIC A:  Anemia  P:  Transfuse per ICU criteria  INFECTIOUS Blood Cx 6/22 >>  Resp 6/22 >> GPC, GNR >>  Urine 6/22 >> negative  Vanco 6/22 >>  Zosyn 6/22 >>  A:  B infiltrates, consider possible PNA. No overt clinical signs P:   Empiric vanco and zosyn started 6/22  ENDOCRINE A:  DM - on insulin   P:  SSI  NEUROLOGIC A:  No issues P:   RASS goal: 0   FAMILY  - Updates: Pt, wife and her caregiver on 6/22   TODAY'S SUMMARY: Acute pulmonary edema, he may be a candidate for CABG. Less likely but consider possible HCAP.  Extubated 6/23  Independent CC time 40 minutes.   Baltazar Apo, MD, PhD 02/12/2015, 10:38 AM [facility] [phone] or if no answer [phone]

## 2015-02-12 NOTE — Progress Notes (Signed)
ANTICOAGULATION CONSULT NOTE - Follow Up Consult  Pharmacy Consult for Heparin Indication: 3V CAD  Allergies  Allergen Reactions  . Bactrim [Sulfamethoxazole-Trimethoprim] Rash    Patient Measurements: Height: 5\' 9"  (175.3 cm) Weight: 208 lb 6.4 oz (94.53 kg) IBW/kg (Calculated) : 70.7  Heparin dosing weight: 90kg  Vital Signs: Temp: 99.3 F (37.4 C) (06/23 1027) Temp Source: Core (Comment) (06/23 0400) BP: 185/92 mmHg (06/23 1151) Pulse Rate: 119 (06/23 1151)  Labs:  Recent Labs  02/11/15 0032 02/11/15 0840 02/11/15 1830 02/12/15 0355 02/12/15 1428  HGB 12.9* 11.2*  --  10.6*  --   HCT 39.5 34.1*  --  32.5*  --   PLT 168 160  --  141*  --   HEPARINUNFRC 0.20* 0.25* 0.34 0.24* 0.23*  CREATININE 1.94* 2.54*  --  2.41*  --   TROPONINI 0.66* 5.75*  --  4.98*  --     Estimated Creatinine Clearance: 27.7 mL/min (by C-G formula based on Cr of 2.41).   Assessment: 81 y.o. M presented to [facility] with CP and transferred to [facility]. He is now s/p cath with multivessel CAD and on heparin. Heparin level is 0.23 and below goal after increase to 1900 units/hr. Noted that CABG has been delayed with renal function and fever.    Goal of Therapy:  Heparin level 0.3-0.7 units/ml Monitor platelets by anticoagulation protocol: Yes   Plan:  -Increase heparin to 2100 units/hr -Heparin level in 8 hrs -Daily heparin level and CBC  Hildred Laser, Pharm D 02/12/2015 3:33 PM

## 2015-02-12 NOTE — Procedures (Signed)
Extubation Procedure Note  Patient Details:   Name: Zachary Mcmahon DOB: Jan 27, 1932 MRN: [id number]   Airway Documentation:     Evaluation  O2 sats: 93 Complications: No apparent complications Patient did tolerate procedure well. Bilateral Breath Sounds: Clear Suctioning: Airway Yes.  Pt is awake and can follow commands. Deep oral sxn done.  Pt has positive cuff leak. Per Dr Lamonte Sakai ok to extubate.  Extubated pt to 4L [place]. Pt tol well. BBS CLR DIM. No stridor. IS done 750   Constance Holster 02/12/2015, 11:03 AM

## 2015-02-12 NOTE — Progress Notes (Addendum)
135 CCs IV Fentanyl wasted in sink and witness by Lillia Abed. [name], R.N.  Witnessed, Carol Ada, RN

## 2015-02-12 NOTE — Progress Notes (Signed)
EKG CRITICAL VALUE     12 lead EKG performed.  Critical value noted. T.Shelton RN notified.   Yehuda Mao, [place] 02/12/2015 10:44 AM

## 2015-02-12 NOTE — Progress Notes (Signed)
ANTICOAGULATION CONSULT NOTE - Follow Up Consult  Pharmacy Consult for heparin Indication: CAD awaiting CABG   Labs:  Recent Labs  02/09/15 0855  02/11/15 0032 02/11/15 0840 02/11/15 1830 02/12/15 0355  HGB  --   < > 12.9* 11.2*  --  10.6*  HCT  --   < > 39.5 34.1*  --  32.5*  PLT  --   < > 168 160  --  141*  LABPROT 15.0  --   --   --   --   --   INR 1.16  --   --   --   --   --   HEPARINUNFRC  --   < > 0.20* 0.25* 0.34 0.24*  CREATININE  --   < > 1.94* 2.54*  --  2.41*  TROPONINI  --   < > 0.66* 5.75*  --  4.98*  < > = values in this interval not displayed.   Assessment: 81yo male subtherapeutic on heparin after one level at low end of goal, no gtt issues per RN.  CABG has been canceled for now.  Goal of Therapy:  Heparin level 0.3-0.7 units/ml   Plan:  Will increase heparin gtt by 1-2 units/kg/hr to 1900 units/hr and check level in [place], PharmD, BCPS  02/12/2015,5:02 AM

## 2015-02-12 NOTE — Procedures (Signed)
Extubation Procedure Note  Patient Details:   Name: Zachary Mcmahon DOB: [birth year] MRN: [id number]   Airway Documentation:     Evaluation  O2 sats: stable throughout Complications: No apparent complications Patient did tolerate procedure well. Bilateral Breath Sounds: Clear Suctioning: Airway Yes  Pt extubated to a 4lpm [place], tolerated well  Cordella Register 02/12/2015, 10:29 AM

## 2015-02-12 NOTE — Progress Notes (Signed)
       Patient Name: Zachary Mcmahon Date of Encounter: 02/12/2015    SUBJECTIVE: Still intubated but now awake. Plan is for extubation later this morning.  TELEMETRY:  Gradual increase in heart rate from the 90s to 130 as patient has awakened from sedation. Filed Vitals:   02/12/15 0600 02/12/15 0700 02/12/15 0845 02/12/15 1027  BP: 161/65 166/51 165/58   Pulse: 66 64 86 138  Temp: 100.2 F (37.9 C) 100 F (37.8 C) 100.2 F (37.9 C) 99.3 F (37.4 C)  TempSrc:      Resp: 18 18  29   Height:      Weight:      SpO2: 100% 100% 100% 93%    Intake/Output Summary (Last 24 hours) at 02/12/15 1057 Last data filed at 02/12/15 0700  Gross per 24 hour  Intake 1297.33 ml  Output   1005 ml  Net 292.33 ml   LABS: Basic Metabolic Panel:  Recent Labs  02/11/15 0840 02/12/15 0355  NA 138 138  K 4.4 3.8  CL 104 105  CO2 25 24  GLUCOSE 225* 123*  BUN 36* 39*  CREATININE 2.54* 2.41*  CALCIUM 9.8 9.6  MG  --  1.7  PHOS  --  2.9   CBC:  Recent Labs  02/11/15 0840 02/12/15 0355  WBC 17.2* 11.8*  HGB 11.2* 10.6*  HCT 34.1* 32.5*  MCV 90.9 91.0  PLT 160 141*   Cardiac Enzymes:  Recent Labs  02/11/15 0032 02/11/15 0840 02/12/15 0355  TROPONINI 0.66* 5.75* 4.98*   BNP    Component Value Date/Time   BNP 279.0* 02/06/2015 1612    Radiology/Studies:  [id number]: Areas of primarily interstitial opacity in both mid and lower lung zones with patchy airspace disease on the right in these areas. These areas appear stable compared to 1 day prior. No new opacity. No change in cardiac silhouette. Tube and catheter positions as described without pneumothorax. The appearance suggests a degree of congestive heart failure, although superimposed pneumonia may well be present as well.  Physical Exam: Blood pressure 165/58, pulse 138, temperature 99.3 F (37.4 C), temperature source Core (Comment), resp. rate 29, height 5\' 9"  (1.753 m), weight 94.53 kg (208 lb 6.4 oz),  SpO2 93 %. Weight change: 0.408 kg (14.4 oz)  Wt Readings from Last 3 Encounters:  02/12/15 94.53 kg (208 lb 6.4 oz)  04/23/14 96.163 kg (212 lb)  10/23/12 93.441 kg (206 lb)   Clear lungs anteriorly on lung auscultation Cardiac exam reveals tachycardia Extremities reveal no edema  ASSESSMENT:  1. Ischemic cardiomyopathy, with repeat echo yesterday demonstrating EF of 45% 2. Severe hypertension, renal artery stenosis is suspected 3. Resolving pulmonary edema, radiographically lagging behind clinical improvement 4. Sinus tachycardia needs to be treated aggressively with beta blocker therapy to prevent recurrence of ischemia and LV ischemic paralysis. He is having rebound from beta blocker withdrawal.  Plan:  1. IV metoprolol 5 mg every 10-15 minutes yet heart rate less than 100 bpm 2. Resume oral beta blocker therapy 3. Continue IV diuresis to improve pulmonary status 4. Continue broad-spectrum anabiotic therapy 5. Resume IV nitroglycerin  Signed, EIVAN, STEPPER 02/12/2015, 10:57 AM

## 2015-02-13 ENCOUNTER — Inpatient Hospital Stay (HOSPITAL_COMMUNITY): Payer: Medicare Other

## 2015-02-13 DIAGNOSIS — N289 Disorder of kidney and ureter, unspecified: Secondary | ICD-10-CM | POA: Insufficient documentation

## 2015-02-13 DIAGNOSIS — I25110 Atherosclerotic heart disease of native coronary artery with unstable angina pectoris: Secondary | ICD-10-CM

## 2015-02-13 DIAGNOSIS — I5023 Acute on chronic systolic (congestive) heart failure: Secondary | ICD-10-CM

## 2015-02-13 DIAGNOSIS — I509 Heart failure, unspecified: Secondary | ICD-10-CM | POA: Insufficient documentation

## 2015-02-13 DIAGNOSIS — I10 Essential (primary) hypertension: Secondary | ICD-10-CM

## 2015-02-13 LAB — GLUCOSE, CAPILLARY
GLUCOSE-CAPILLARY: 105 mg/dL — AB (ref 65–99)
GLUCOSE-CAPILLARY: 168 mg/dL — AB (ref 65–99)
GLUCOSE-CAPILLARY: 202 mg/dL — AB (ref 65–99)
Glucose-Capillary: 121 mg/dL — ABNORMAL HIGH (ref 65–99)
Glucose-Capillary: 167 mg/dL — ABNORMAL HIGH (ref 65–99)
Glucose-Capillary: 226 mg/dL — ABNORMAL HIGH (ref 65–99)

## 2015-02-13 LAB — CBC
HCT: 34.5 % — ABNORMAL LOW (ref 39.0–52.0)
Hemoglobin: 11.3 g/dL — ABNORMAL LOW (ref 13.0–17.0)
MCH: 29.7 pg (ref 26.0–34.0)
MCHC: 32.8 g/dL (ref 30.0–36.0)
MCV: 90.6 fL (ref 78.0–100.0)
PLATELETS: 177 10*3/uL (ref 150–400)
RBC: 3.81 MIL/uL — AB (ref 4.22–5.81)
RDW: 13.5 % (ref 11.5–15.5)
WBC: 16.6 10*3/uL — AB (ref 4.0–10.5)

## 2015-02-13 LAB — BASIC METABOLIC PANEL
ANION GAP: 9 (ref 5–15)
BUN: 47 mg/dL — AB (ref 6–20)
CALCIUM: 9.8 mg/dL (ref 8.9–10.3)
CO2: 25 mmol/L (ref 22–32)
CREATININE: 2.1 mg/dL — AB (ref 0.61–1.24)
Chloride: 104 mmol/L (ref 101–111)
GFR calc Af Amer: 33 mL/min — ABNORMAL LOW (ref >60)
GFR, EST NON AFRICAN AMERICAN: 28 mL/min — AB (ref >60)
Glucose, Bld: 139 mg/dL — ABNORMAL HIGH (ref 65–99)
Potassium: 4.1 mmol/L (ref 3.5–5.1)
SODIUM: 138 mmol/L (ref 135–145)

## 2015-02-13 LAB — HEPARIN LEVEL (UNFRACTIONATED)
Heparin Unfractionated: 0.58 IU/mL (ref 0.30–0.70)
Heparin Unfractionated: 0.44 IU/mL (ref 0.30–0.70)

## 2015-02-13 LAB — MAGNESIUM: Magnesium: 1.9 mg/dL (ref 1.7–2.4)

## 2015-02-13 LAB — PROCALCITONIN: Procalcitonin: 2.56 ng/mL

## 2015-02-13 LAB — CULTURE, RESPIRATORY

## 2015-02-13 LAB — PHOSPHORUS: PHOSPHORUS: 3.3 mg/dL (ref 2.5–4.6)

## 2015-02-13 LAB — CULTURE, RESPIRATORY W GRAM STAIN: Special Requests: NORMAL

## 2015-02-13 MED ORDER — FUROSEMIDE 10 MG/ML IJ SOLN
40.0000 mg | Freq: Two times a day (BID) | INTRAMUSCULAR | Status: AC
  Administered 2015-02-13 (×2): 40 mg via INTRAVENOUS
  Filled 2015-02-13 (×2): qty 4

## 2015-02-13 NOTE — Progress Notes (Signed)
*  PRELIMINARY RESULTS* Vascular Ultrasound Renal Artery Duplex has been completed.  Preliminary findings: Technically difficult study due to body habitus. No evidence of Right RAS. Left proximal renal artery appears to have 60-99% stenosis, based on velocities.   Landry Mellow, RDMS, RVT  02/13/2015, 4:00 PM

## 2015-02-13 NOTE — Progress Notes (Signed)
PULMONARY / CRITICAL CARE MEDICINE   Name: Marcial Thai MRN: [id number] DOB: 1931-09-12    ADMISSION DATE:  02/06/2015 CONSULTATION DATE:  02/13/2015  REFERRING MD :  Clementeen Graham MD  CHIEF COMPLAINT:  resp distress  INITIAL PRESENTATION: 81 y.o with severe Htn, CKD, severe 3V CAD admitted6/17 with nstemI, transferred to ICU for flash pulmonary edema 6/20, on BiPAP  STUDIES:  2-D echo 6/18 >> EF of 45 -50% with akinesis of the mid apical inferior septal myocardium. Also shows grade 1 diastolic dysfunction. PFTs 6/21 >> no obs, DLCO 60% Cath >> Severe 3 vessel CAD with totally occluded RCA and severe LAD and RCA  SIGNIFICANT EVENTS: 6/21 bipap >> ETT   SUBJECTIVE:  Extubated Comfortable and up to chair Eating breakfast   VITAL SIGNS: Temp:  [96.4 F (35.8 C)-99.3 F (37.4 C)] 98.8 F (37.1 C) (06/24 0900) Pulse Rate:  [74-137] 90 (06/24 0900) Resp:  [16-32] 21 (06/24 0900) BP: (124-201)/(54-106) 154/82 mmHg (06/24 0900) SpO2:  [92 %-99 %] 97 % (06/24 0900) Weight:  [93.6 kg (206 lb 5.6 oz)] 93.6 kg (206 lb 5.6 oz) (06/24 0359) HEMODYNAMICS:   VENTILATOR SETTINGS:   INTAKE / OUTPUT:  Intake/Output Summary (Last 24 hours) at 02/13/15 1050 Last data filed at 02/13/15 1000  Gross per 24 hour  Intake 1113.75 ml  Output    790 ml  Net 323.75 ml    PHYSICAL EXAMINATION: Gen. Appears stated age, well-nourished, in no distress, normal affect ENT - no lesions, no post nasal drip Neck:  JVD +, no thyromegaly, no carotid bruits Lungs:  Clear bilaterally, crackles are improved Cardiovascular: Rhythm regular, heart sounds  normal, no murmurs, 1+ peripheral edema Musculoskeletal: No deformities, no cyanosis or clubbing Neuro:  alert, non focal, follows commands Skin:  Warm, no lesions/ rash   LABS:  CBC  Recent Labs Lab 02/11/15 0840 02/12/15 0355 02/13/15 0355  WBC 17.2* 11.8* 16.6*  HGB 11.2* 10.6* 11.3*  HCT 34.1* 32.5* 34.5*  PLT 160 141* 177    Coag's  Recent Labs Lab 02/06/15 1612 02/09/15 0855  APTT 39*  --   INR 1.16 1.16   BMET  Recent Labs Lab 02/11/15 0840 02/12/15 0355 02/13/15 0355  NA 138 138 138  K 4.4 3.8 4.1  CL 104 105 104  CO2 25 24 25   BUN 36* 39* 47*  CREATININE 2.54* 2.41* 2.10*  GLUCOSE 225* 123* 139*   Electrolytes  Recent Labs Lab 02/07/15 0047  02/11/15 0840 02/12/15 0355 02/13/15 0355  CALCIUM  --   < > 9.8 9.6 9.8  MG 1.7  --   --  1.7 1.9  PHOS  --   --   --  2.9 3.3  < > = values in this interval not displayed. Sepsis Markers  Recent Labs Lab 02/06/15 2147 02/11/15 0140 02/11/15 0530 02/12/15 0355 02/13/15 0355  LATICACIDVEN 0.8 1.9 1.6  --   --   PROCALCITON  --  0.58  --  4.54 2.56   ABG  Recent Labs Lab 02/10/15 1831 02/10/15 2046  PHART 7.168* 7.325*  PCO2ART 76.5* 45.0  PO2ART 350.0* 140.0*   Liver Enzymes  Recent Labs Lab 02/06/15 1611 02/10/15 0440  AST 13* 16  ALT 10* 12*  ALKPHOS 91 69  BILITOT 0.9 0.8  ALBUMIN 4.0 2.9*   Cardiac Enzymes  Recent Labs Lab 02/11/15 0032 02/11/15 0840 02/12/15 0355  TROPONINI 0.66* 5.75* 4.98*   Glucose  Recent Labs Lab 02/12/15 0716 02/12/15 1135 02/12/15 1606  02/12/15 2034 02/13/15 0346 02/13/15 0727  GLUCAP 139* 199* 172* 180* 121* 105*    Imaging Dg Chest Port 1 View  02/13/2015   CLINICAL DATA:  Acute respiratory failure.  EXAM: PORTABLE CHEST - 1 VIEW  COMPARISON:  02/12/2015.  FINDINGS: Interim removal of endotracheal tube and NG tube. Right IJ line in stable position. Cardiomegaly with bilateral pulmonary interstitial infiltrates, right side greater than left suggesting persistent congestive heart failure. Pneumonia cannot be excluded. Low lung volumes with bibasilar atelectasis. Small right pleural effusion cannot be excluded. No pneumothorax.  IMPRESSION: 1. Interim removal of endotracheal tube and NG tube. Right IJ line in stable position. 2. Cardiomegaly with persist bilateral  pulmonary interstitial infiltrates, right side greater than left. Findings suggest congestive heart failure pulmonary interstitial edema. Pneumonia cannot be excluded. Associated small right pleural effusion. 3. Low lung volumes with bibasilar atelectasis.   Electronically Signed   By: Marcello Moores  Register   On: 02/13/2015 07:43   CXR from 6/23 reviewed personally by me. No real change in his basilar infiltrates.   ASSESSMENT / PLAN:  PULMONARY OETT 6/21 >> 6/23 A:Acute hypoxic and hypercarbic resp failure due to flash pulm edema Possible HCAP P:   Push pulm hygiene Diuresis as he can tolerate Continue empiric abx as below  CARDIOVASCULAR  A: Acute pulm edema 3V CAD with acute NSTEMI (? primary event vs stress related) Acute systolic CHF Hypertensive emergency P:  Clonidine and cozaar stopped, metoprolol started 6/23 in addition to NTG gtt Hydralazine & lopressor IV prn IV heparin gtt Discussed status and CABG plan with Dr Servando Snare 6/24. If pt remains stable, no CP or resp decline, fever resolves then will plan for OR on 6/28.   RENAL A:  Acute on CKD, improving P:   Monitor cr & lytes, replete as needed Additional lasix given 6/24, 2 doses and reassess am  GASTROINTESTINAL A:  Nausea/ vomiting P:   Zofran PO diet started  HEMATOLOGIC A:  Anemia  P:  Transfuse per ICU criteria  INFECTIOUS Blood Cx 6/22 >>  Resp 6/22 >> GPC, GNR >> normal flora Urine 6/22 >> negative  Vanco 6/22 >> 6/24 Zosyn 6/22 >>   A:  B infiltrates, consider possible PNA. No overt clinical signs P:   Empiric vanco and zosyn started 6/22, peel off vanco 6/24.   ENDOCRINE A:  DM - on insulin   P:   SSI  NEUROLOGIC A:  No issues P:   RASS goal: 0   FAMILY  - Updates: Pt, wife and her caregiver on 6/22; pt on 6/23-24  TODAY'S SUMMARY: Acute pulmonary edema, he may be a candidate for CABG. Less likely but consider possible HCAP.  Extubated 6/23   Baltazar Apo, MD, PhD 02/13/2015,  10:50 AM [facility] [phone] or if no answer [phone]

## 2015-02-13 NOTE — Progress Notes (Signed)
[place] for Heparin Indication: 3V CAD  Allergies  Allergen Reactions  . Bactrim [Sulfamethoxazole-Trimethoprim] Rash    Patient Measurements: Height: 5\' 9"  (175.3 cm) Weight: 208 lb 6.4 oz (94.53 kg) IBW/kg (Calculated) : 70.7  Heparin dosing weight: 90kg  Vital Signs: Temp: 99 F (37.2 C) (06/24 0200) BP: 145/82 mmHg (06/24 0200) Pulse Rate: 86 (06/24 0200)  Labs:  Recent Labs  02/11/15 0032 02/11/15 0840  02/12/15 0355 02/12/15 1428 02/13/15 0130  HGB 12.9* 11.2*  --  10.6*  --   --   HCT 39.5 34.1*  --  32.5*  --   --   PLT 168 160  --  141*  --   --   HEPARINUNFRC 0.20* 0.25*  < > 0.24* 0.23* 0.58  CREATININE 1.94* 2.54*  --  2.41*  --   --   TROPONINI 0.66* 5.75*  --  4.98*  --   --   < > = values in this interval not displayed.  Estimated Creatinine Clearance: 27.7 mL/min (by C-G formula based on Cr of 2.41).   Assessment: 81 y.o. male with NSTEMI s/p cath, awaiting CABG, for heparin    Goal of Therapy:  Heparin level 0.3-0.7 units/ml Monitor platelets by anticoagulation protocol: Yes   Plan:  Continue Heparin at current rate  Phillis Knack, PharmD, BCPS   02/13/2015 3:20 AM

## 2015-02-13 NOTE — Progress Notes (Signed)
Patient Name: Zachary Mcmahon Date of Encounter: 02/13/2015    SUBJECTIVE: Lying flay. Extubated and no chest pain or dyspnea.  TELEMETRY:  Normal sinus rhythm now back on standard beta blocker dosing Filed Vitals:   02/13/15 0500 02/13/15 0600 02/13/15 0700 02/13/15 0728  BP: 137/70 141/78 147/77 147/77  Pulse: 84 84 86 85  Temp: 99.1 F (37.3 C) 98.8 F (37.1 C) 98.8 F (37.1 C) 98.6 F (37 C)  TempSrc:    Core (Comment)  Resp: 22 32 25 19  Height:      Weight:      SpO2: 98% 95% 97% 95%    Intake/Output Summary (Last 24 hours) at 02/13/15 0907 Last data filed at 02/13/15 0700  Gross per 24 hour  Intake 837.75 ml  Output    850 ml  Net -12.25 ml   LABS: Basic Metabolic Panel:  Recent Labs  02/12/15 0355 02/13/15 0355  NA 138 138  K 3.8 4.1  CL 105 104  CO2 24 25  GLUCOSE 123* 139*  BUN 39* 47*  CREATININE 2.41* 2.10*  CALCIUM 9.6 9.8  MG 1.7 1.9  PHOS 2.9 3.3   CBC:  Recent Labs  02/12/15 0355 02/13/15 0355  WBC 11.8* 16.6*  HGB 10.6* 11.3*  HCT 32.5* 34.5*  MCV 91.0 90.6  PLT 141* 177   Cardiac Enzymes:  Recent Labs  02/11/15 0032 02/11/15 0840 02/12/15 0355  TROPONINI 0.66* 5.75* 4.98*   BNP    Component Value Date/Time   BNP 279.0* 02/06/2015 1612      Radiology/Studies:  CXR from yesterday  IMPRESSION: 1. Interim removal of endotracheal tube and NG tube. Right IJ line in stable position. 2. Cardiomegaly with persist bilateral pulmonary interstitial infiltrates, right side greater than left. Findings suggest congestive heart failure pulmonary interstitial edema. Pneumonia cannot be excluded. Associated small right pleural effusion. 3. Low lung volumes with bibasilar atelectasis.  Physical Exam: Blood pressure 147/77, pulse 85, temperature 98.6 F (37 C), temperature source Core (Comment), resp. rate 19, height 5\' 9"  (1.753 m), weight 93.6 kg (206 lb 5.6 oz), SpO2 95 %. Weight change: -0.93 kg (-2 lb 0.8 oz)  Wt  Readings from Last 3 Encounters:  02/13/15 93.6 kg (206 lb 5.6 oz)  04/23/14 96.163 kg (212 lb)  10/23/12 93.441 kg (206 lb)    Lying flat in bed in no distress S4 gallop on auscultation Midlung field rales No peripheral edema Neurologically intact  ASSESSMENT:  1. Severe three-vessel coronary disease with total occlusion of the RCA and high-grade diffuse LAD disease as well as significant circumflex disease. He has suffered a non-ST elevation MI on presentation, continued to have post infarct angina. He had additional injury based upon enzymes but no EKG evidence of significant myocardial loss. Repeat echo demonstrates persistently low normal LV function with EF of 45 to 50% postevent. 2. Acute on chronic kidney injury, improving 3. Pulmonary edema, clinically improving 4. Hypertension under much better control on the current medical regimen which includes metoprolol and IV nitroglycerin 5. Possible infection, persistent elevation in white count, but no growth in cultures. Also now afebrile.   Plan: 1. Continue anabiotic therapy 2. Continue aggressive blood pressure control and maintain IV nitroglycerin and beta blocker therapy 3. Coronary bypass grafting at some point, hopefully next week 4. Continue to track kidney function 5. Resume diuresis 6. Hold ARB therapy 7. Kidney duplex and ultrasound study to rule out renal artery stenosis  Signed, Olen Pel  W 02/13/2015, 9:07 AM

## 2015-02-13 NOTE — Progress Notes (Signed)
Patient ID: Zachary Mcmahon, male   DOB: 04/24/1932, 81 y.o.   MRN: [id number] TCTS DAILY ICU PROGRESS NOTE                   [address]            [place] [address]          [phone]   4 Days Post-Op Procedure(s) (LRB): Left Heart Cath and Coronary Angiography (N/A)  Total Length of Stay:  LOS: 7 days   Subjective: Patient awake and alert, still appears fatigued midly sob but improved from yesterday after extubation  Objective: Vital signs in last 24 hours: Temp:  [96.4 F (35.8 C)-99.3 F (37.4 C)] 98.8 F (37.1 C) (06/24 0900) Pulse Rate:  [74-137] 90 (06/24 0900) Cardiac Rhythm:  [-] Normal sinus rhythm (06/23 2100) Resp:  [16-32] 21 (06/24 0900) BP: (124-201)/(54-106) 154/82 mmHg (06/24 0900) SpO2:  [92 %-99 %] 97 % (06/24 0900) Weight:  [206 lb 5.6 oz (93.6 kg)] 206 lb 5.6 oz (93.6 kg) (06/24 0359)  Filed Weights   02/11/15 0341 02/12/15 0412 02/13/15 0359  Weight: 207 lb 8 oz (94.121 kg) 208 lb 6.4 oz (94.53 kg) 206 lb 5.6 oz (93.6 kg)    Weight change: -2 lb 0.8 oz (-0.93 kg)   Hemodynamic parameters for last 24 hours:    Intake/Output from previous day: 06/23 0701 - 06/24 0700 In: 935.8 [I.V.:693.3; NG/GT:80; IV Piggyback:162.5] Out: 1000 [Urine:1000]  Intake/Output this shift: Total I/O In: 360 [P.O.:240; I.V.:120] Out: -   Current Meds: Scheduled Meds: . antiseptic oral rinse  7 mL Mouth Rinse QID  . aspirin  81 mg Per Tube Daily  . atorvastatin  20 mg Oral q1800  . chlorhexidine  15 mL Mouth Rinse BID  . furosemide  40 mg Intravenous BID  . gabapentin  100 mg Oral TID  . insulin aspart  0-15 Units Subcutaneous 6 times per day  . insulin detemir  36 Units Subcutaneous QHS  . metoprolol tartrate  50 mg Oral BID  . pantoprazole (PROTONIX) IV  40 mg Intravenous Q12H  . piperacillin-tazobactam (ZOSYN)  IV  3.375 g Intravenous Q8H  . sodium chloride  3 mL Intravenous Q12H  . vancomycin  1,000 mg Intravenous Q48H   Continuous  Infusions: . heparin 2,100 Units/hr (02/13/15 1030)  . nitroGLYCERIN 30 mcg/min (02/12/15 1900)   PRN Meds:.sodium chloride, acetaminophen **OR** acetaminophen (TYLENOL) oral liquid 160 mg/5 mL, alum & mag hydroxide-simeth, fentaNYL, metoprolol, midazolam, midazolam, morphine injection, ondansetron (ZOFRAN) IV, sodium chloride  General appearance: alert, cooperative, appears older than stated age and no distress Neurologic: intact Heart: regular rate and rhythm, S1, S2 normal, no murmur, click, rub or gallop Lungs: diminished breath sounds bilaterally Abdomen: soft, non-tender; bowel sounds normal; no masses,  no organomegaly Extremities: unchanged bilaterial edema, severe varicosities on the left , no usable vein on the left  Lab Results: CBC: Recent Labs  02/12/15 0355 02/13/15 0355  WBC 11.8* 16.6*  HGB 10.6* 11.3*  HCT 32.5* 34.5*  PLT 141* 177   BMET:  Recent Labs  02/12/15 0355 02/13/15 0355  NA 138 138  K 3.8 4.1  CL 105 104  CO2 24 25  GLUCOSE 123* 139*  BUN 39* 47*  CREATININE 2.41* 2.10*  CALCIUM 9.6 9.8    PT/INR: No results for input(s): LABPROT, INR in the last 72 hours. Radiology: Dg Chest Port 1 View  02/13/2015   CLINICAL DATA:  Acute respiratory  failure.  EXAM: PORTABLE CHEST - 1 VIEW  COMPARISON:  02/12/2015.  FINDINGS: Interim removal of endotracheal tube and NG tube. Right IJ line in stable position. Cardiomegaly with bilateral pulmonary interstitial infiltrates, right side greater than left suggesting persistent congestive heart failure. Pneumonia cannot be excluded. Low lung volumes with bibasilar atelectasis. Small right pleural effusion cannot be excluded. No pneumothorax.  IMPRESSION: 1. Interim removal of endotracheal tube and NG tube. Right IJ line in stable position. 2. Cardiomegaly with persist bilateral pulmonary interstitial infiltrates, right side greater than left. Findings suggest congestive heart failure pulmonary interstitial edema.  Pneumonia cannot be excluded. Associated small right pleural effusion. 3. Low lung volumes with bibasilar atelectasis.   Electronically Signed   By: Marcello Moores  Register   On: 02/13/2015 07:43   US Renal  02/10/2015   CLINICAL DATA:  Chronic kidney disease.  EXAM: RENAL / URINARY TRACT ULTRASOUND COMPLETE  COMPARISON:  None.  FINDINGS: Right Kidney:  Length: 12.6 cm. Cortical thinning. Diffusely increased echotexture. No focal abnormality or hydronephrosis.  Left Kidney:  Length: 12.3 cm. Cortical thinning. Diffusely increased echotexture. Multiple cysts, the largest in the lower pole measuring 4.4 cm. These appear simple. No hydronephrosis.  Bladder:  Appears normal for degree of bladder distention.  IMPRESSION: Cortical thinning with increased echotexture bilaterally. Findings suggestive of chronic medical renal disease. No hydronephrosis.   Electronically Signed   By: Rolm Baptise M.D.   On: 02/10/2015 09:33   Chronic Kidney Disease   Stage I     GFR >90  Stage II    GFR 60-89  Stage IIIA GFR 45-59  Stage IIIB GFR 30-44  Stage IV   GFR 15-29  Stage V    GFR  <15  Lab Results  Component Value Date   CREATININE 2.10* 02/13/2015   Estimated Creatinine Clearance: 31.7 mL/min (by C-G formula based on Cr of 2.1).  Currently: Risk Model and Variables - STS Adult Cardiac Surgery Database Version 2.81 RISK SCORES About the STS Risk Calculator Procedure: CAB Only  Risk of Mortality: 15.346%  Morbidity or Mortality: 57.775%  Long Length of Stay: 39.961%  Short Length of Stay: 5.275%  Permanent Stroke: 4.643%  Prolonged Ventilation: 41.818%  DSW Infection: 1.833%  Renal Failure: 32.997%  Reoperation: 16.804%    Assessment/Plan: S/P Procedure(s) (LRB): Left Heart Cath and Coronary Angiography (N/A) Overall improving toward considering cabg, but still low grade temps, elevated WBC and persist bilateral pulmonary interstitial infiltrates, right side greater than left. Stage IIIb ckd improved  from yesterday Slowly improving , Consider CABG Tuesday if pulmonary and poss infection clears     [name] 02/13/2015 10:50 AM

## 2015-02-13 NOTE — Evaluation (Signed)
Physical Therapy Evaluation Patient Details Name: Zachary Mcmahon MRN: [id number] DOB: October 03, 1931 Today's Date: 02/13/2015   History of Present Illness  81 yo male h/o dm, htn, abnormal myoview testing in 2013, no previous heart cath comes in with chest pain across his lower chest along with generalized abdominal pain. Pt with NSTEMI, and pulmonary edema, VDRF 6/21-6/23  Clinical Impression  Pt moving well with son and visitors pleasant. Pt has cane and RW at home he uses throughout the day but typically very independent and fishing frequently. Pt with decreased activity tolerance today and educated for goals, RW use and encouraged increased mobility with nursing. Pt will benefit from acute therapy to maximize mobility, gait and function to decrease burden of care and return pt to Mod I level of function.     Follow Up Recommendations Home health PT    Equipment Recommendations  None recommended by PT    Recommendations for Other Services       Precautions / Restrictions Precautions Precautions: Fall      Mobility  Bed Mobility               General bed mobility comments: EOB on arrival  Transfers Overall transfer level: Needs assistance   Transfers: Sit to/from Stand Sit to Stand: Min assist         General transfer comment: cues for hand placement, sequence and safety  Ambulation/Gait Ambulation/Gait assistance: Min guard Ambulation Distance (Feet): 90 Feet Assistive device: Rolling walker (2 wheeled) Gait Pattern/deviations: Step-through pattern;Decreased stride length;Trunk flexed   Gait velocity interpretation: Below normal speed for age/gender General Gait Details: cues for posture and position in RW  Stairs            Wheelchair Mobility    Modified Rankin (Stroke Patients Only)       Balance Overall balance assessment: Needs assistance   Sitting balance-Leahy Scale: Good       Standing balance-Leahy Scale: Fair                                Pertinent Vitals/Pain Pain Assessment: No/denies pain  HR 95-99 sats 92-97% on 1L BP 146/73    Home Living Family/patient expects to be discharged to:: Private residence Living Arrangements: Spouse/significant other Available Help at Discharge: Personal care attendant Type of Home: House Home Access: Stairs to enter   [employer] of Steps: 2 Home Layout: One level Home Equipment: Environmental consultant - 2 wheels;Cane - single point;Electric scooter Additional Comments: pt with neuropathy and reports gait distance limited by foot pain at baseline    Prior Function Level of Independence: Independent with assistive device(s)         Comments: Wife has Alzheimers with caretaker at home who can assist pt as needed     Hand Dominance        Extremity/Trunk Assessment   Upper Extremity Assessment: Overall WFL for tasks assessed           Lower Extremity Assessment: Overall WFL for tasks assessed      Cervical / Trunk Assessment: Kyphotic (forward head with maintained cervical flexion)  Communication   Communication: No difficulties  Cognition Arousal/Alertness: Awake/alert Behavior During Therapy: Flat affect Overall Cognitive Status: Within Functional Limits for tasks assessed                      General Comments      Exercises  Assessment/Plan    PT Assessment Patient needs continued PT services  PT Diagnosis Difficulty walking   PT Problem List Decreased activity tolerance;Decreased balance;Decreased knowledge of use of DME;Cardiopulmonary status limiting activity;Decreased mobility  PT Treatment Interventions Gait training;Stair training;Functional mobility training;Therapeutic activities;Therapeutic exercise;DME instruction;Patient/family education   PT Goals (Current goals can be found in the Care Plan section) Acute Rehab PT Goals Patient Stated Goal: go fishing PT Goal Formulation: With patient Time For Goal  Achievement: 02/27/15 Potential to Achieve Goals: Fair    Frequency Min 3X/week   Barriers to discharge Decreased caregiver support      Co-evaluation               End of Session Equipment Utilized During Treatment: Oxygen Activity Tolerance: Patient tolerated treatment well Patient left: in chair;with call bell/phone within reach;with nursing/sitter in room;with family/visitor present Nurse Communication: Mobility status         Time: 1026-1050 PT Time Calculation (min) (ACUTE ONLY): 24 min   Charges:   PT Evaluation $Initial PT Evaluation Tier I: 1 Procedure PT Treatments $Gait Training: 8-22 mins   PT G CodesMelford Aase 02/13/2015, 11:00 AM Elwyn Reach, [place]

## 2015-02-13 NOTE — Progress Notes (Signed)
ANTICOAGULATION CONSULT NOTE - Follow Up Consult  Pharmacy Consult for Heparin Indication: 3V CAD  Allergies  Allergen Reactions  . Bactrim [Sulfamethoxazole-Trimethoprim] Rash    Patient Measurements: Height: 5\' 9"  (175.3 cm) Weight: 206 lb 5.6 oz (93.6 kg) IBW/kg (Calculated) : 70.7  Heparin dosing weight: 90kg  Vital Signs: Temp: 98.8 F (37.1 C) (06/24 0900) Temp Source: Core (Comment) (06/24 0728) BP: 154/82 mmHg (06/24 0900) Pulse Rate: 95 (06/24 1057)  Labs:  Recent Labs  02/11/15 0032 02/11/15 0840  02/12/15 0355 02/12/15 1428 02/13/15 0130 02/13/15 0355  HGB 12.9* 11.2*  --  10.6*  --   --  11.3*  HCT 39.5 34.1*  --  32.5*  --   --  34.5*  PLT 168 160  --  141*  --   --  177  HEPARINUNFRC 0.20* 0.25*  < > 0.24* 0.23* 0.58 0.44  CREATININE 1.94* 2.54*  --  2.41*  --   --  2.10*  TROPONINI 0.66* 5.75*  --  4.98*  --   --   --   < > = values in this interval not displayed.  Estimated Creatinine Clearance: 31.7 mL/min (by C-G formula based on Cr of 2.1).   Assessment: 81 y.o. M presented to [facility] with CP and transferred to [facility]. He is now s/p cath with multivessel CAD and on heparin. Heparin level is 0.44 and at goal. Noted for possible CABG on Tuesday.   Goal of Therapy:  Heparin level 0.3-0.7 units/ml Monitor platelets by anticoagulation protocol: Yes   Plan:  -No heparin changes needed -Daily heparin level and CBC  Hildred Laser, Pharm D 02/13/2015 11:22 AM

## 2015-02-14 ENCOUNTER — Inpatient Hospital Stay (HOSPITAL_COMMUNITY): Payer: Medicare Other

## 2015-02-14 DIAGNOSIS — L8990 Pressure ulcer of unspecified site, unspecified stage: Secondary | ICD-10-CM | POA: Insufficient documentation

## 2015-02-14 DIAGNOSIS — R0789 Other chest pain: Secondary | ICD-10-CM

## 2015-02-14 DIAGNOSIS — I5032 Chronic diastolic (congestive) heart failure: Secondary | ICD-10-CM

## 2015-02-14 LAB — CBC
HCT: 33.2 % — ABNORMAL LOW (ref 39.0–52.0)
Hemoglobin: 10.7 g/dL — ABNORMAL LOW (ref 13.0–17.0)
MCH: 29.3 pg (ref 26.0–34.0)
MCHC: 32.2 g/dL (ref 30.0–36.0)
MCV: 91 fL (ref 78.0–100.0)
Platelets: 186 10*3/uL (ref 150–400)
RBC: 3.65 MIL/uL — ABNORMAL LOW (ref 4.22–5.81)
RDW: 13.5 % (ref 11.5–15.5)
WBC: 12.2 10*3/uL — ABNORMAL HIGH (ref 4.0–10.5)

## 2015-02-14 LAB — GLUCOSE, CAPILLARY
GLUCOSE-CAPILLARY: 94 mg/dL (ref 65–99)
GLUCOSE-CAPILLARY: 65 mg/dL (ref 65–99)
GLUCOSE-CAPILLARY: 215 mg/dL — AB (ref 65–99)
Glucose-Capillary: 269 mg/dL — ABNORMAL HIGH (ref 65–99)
Glucose-Capillary: 159 mg/dL — ABNORMAL HIGH (ref 65–99)

## 2015-02-14 LAB — BASIC METABOLIC PANEL
ANION GAP: 9 (ref 5–15)
BUN: 47 mg/dL — ABNORMAL HIGH (ref 6–20)
CALCIUM: 9.9 mg/dL (ref 8.9–10.3)
CHLORIDE: 104 mmol/L (ref 101–111)
CO2: 28 mmol/L (ref 22–32)
CREATININE: 2.16 mg/dL — AB (ref 0.61–1.24)
GFR, EST AFRICAN AMERICAN: 31 mL/min — AB (ref >60)
GFR, EST NON AFRICAN AMERICAN: 27 mL/min — AB (ref >60)
Glucose, Bld: 94 mg/dL (ref 65–99)
Potassium: 3.4 mmol/L — ABNORMAL LOW (ref 3.5–5.1)
Sodium: 141 mmol/L (ref 135–145)

## 2015-02-14 LAB — HEPARIN LEVEL (UNFRACTIONATED): HEPARIN UNFRACTIONATED: 0.33 [IU]/mL (ref 0.30–0.70)

## 2015-02-14 MED ORDER — POTASSIUM CHLORIDE CRYS ER 20 MEQ PO TBCR
40.0000 meq | EXTENDED_RELEASE_TABLET | Freq: Once | ORAL | Status: AC
  Administered 2015-02-14: 40 meq via ORAL
  Filled 2015-02-14: qty 2

## 2015-02-14 MED ORDER — ASPIRIN 81 MG PO CHEW
81.0000 mg | CHEWABLE_TABLET | Freq: Every day | ORAL | Status: DC
  Administered 2015-02-15 – 2015-02-16 (×2): 81 mg
  Filled 2015-02-14 (×2): qty 1

## 2015-02-14 MED ORDER — CARVEDILOL 12.5 MG PO TABS
12.5000 mg | ORAL_TABLET | Freq: Two times a day (BID) | ORAL | Status: DC
  Administered 2015-02-14 – 2015-02-16 (×4): 12.5 mg via ORAL
  Filled 2015-02-14 (×6): qty 1

## 2015-02-14 MED ORDER — INSULIN ASPART 100 UNIT/ML ~~LOC~~ SOLN
0.0000 [IU] | Freq: Every day | SUBCUTANEOUS | Status: DC
  Administered 2015-02-15: 2 [IU] via SUBCUTANEOUS
  Administered 2015-02-16: 3 [IU] via SUBCUTANEOUS

## 2015-02-14 MED ORDER — FUROSEMIDE 10 MG/ML IJ SOLN: 40.0000 mg | Freq: Two times a day (BID) | INTRAMUSCULAR | Status: DC

## 2015-02-14 MED ORDER — INSULIN ASPART 100 UNIT/ML ~~LOC~~ SOLN
0.0000 [IU] | Freq: Three times a day (TID) | SUBCUTANEOUS | Status: DC
  Administered 2015-02-15: 8 [IU] via SUBCUTANEOUS
  Administered 2015-02-15: 5 [IU] via SUBCUTANEOUS
  Administered 2015-02-15 – 2015-02-16 (×4): 3 [IU] via SUBCUTANEOUS

## 2015-02-14 MED ORDER — CEFTRIAXONE SODIUM IN DEXTROSE 20 MG/ML IV SOLN
1.0000 g | INTRAVENOUS | Status: DC
  Administered 2015-02-14 – 2015-02-16 (×3): 1 g via INTRAVENOUS
  Filled 2015-02-14 (×4): qty 50

## 2015-02-14 MED ORDER — ATORVASTATIN CALCIUM 80 MG PO TABS
80.0000 mg | ORAL_TABLET | Freq: Every day | ORAL | Status: DC
  Administered 2015-02-14 – 2015-02-23 (×10): 80 mg via ORAL
  Filled 2015-02-14 (×12): qty 1

## 2015-02-14 MED ORDER — CEFTRIAXONE SODIUM IN DEXTROSE 20 MG/ML IV SOLN: 1.0000 g | INTRAVENOUS | Status: DC

## 2015-02-14 MED ORDER — ASPIRIN 81 MG PO CHEW: 81.0000 mg | CHEWABLE_TABLET | Freq: Every day | ORAL | Status: DC

## 2015-02-14 MED ORDER — FUROSEMIDE 10 MG/ML IJ SOLN
40.0000 mg | Freq: Two times a day (BID) | INTRAMUSCULAR | Status: DC
  Administered 2015-02-14 (×2): 40 mg via INTRAVENOUS
  Filled 2015-02-14 (×4): qty 4

## 2015-02-14 NOTE — Progress Notes (Addendum)
Patient ID: Zachary Mcmahon, male   DOB: 1932-04-28, 81 y.o.   MRN: [id number]   SUBJECTIVE: No chest pain.  Remains on NTG gtt and heparin gtt.  Creatinine stable, diuresed some with IV Lasix yesterday. Afebrile.    Scheduled Meds: . aspirin  81 mg Per Tube Daily  . atorvastatin  80 mg Oral q1800  . carvedilol  12.5 mg Oral BID WC  . furosemide  40 mg Intravenous BID  . gabapentin  100 mg Oral TID  . insulin aspart  0-15 Units Subcutaneous 6 times per day  . insulin detemir  36 Units Subcutaneous QHS  . piperacillin-tazobactam (ZOSYN)  IV  3.375 g Intravenous Q8H  . potassium chloride  40 mEq Oral Once  . sodium chloride  3 mL Intravenous Q12H   Continuous Infusions: . heparin 2,100 Units/hr (02/13/15 2201)  . nitroGLYCERIN 30 mcg/min (02/12/15 1900)   PRN Meds:.sodium chloride, acetaminophen **OR** acetaminophen (TYLENOL) oral liquid 160 mg/5 mL, alum & mag hydroxide-simeth, metoprolol, morphine injection, ondansetron (ZOFRAN) IV, sodium chloride    Filed Vitals:   02/14/15 0500 02/14/15 0600 02/14/15 0700 02/14/15 0751  BP:  157/82    Pulse: 79 86 86 95  Temp:    98.4 F (36.9 C)  TempSrc:    Oral  Resp: 15 16 16 17   Height:      Weight:      SpO2: 99% 96% 99% 97%    Intake/Output Summary (Last 24 hours) at 02/14/15 1119 Last data filed at 02/14/15 0600  Gross per 24 hour  Intake   1045 ml  Output   2100 ml  Net  -1055 ml    LABS: Basic Metabolic Panel:  Recent Labs  02/12/15 0355 02/13/15 0355 02/14/15 0443  NA 138 138 141  K 3.8 4.1 3.4*  CL 105 104 104  CO2 24 25 28   GLUCOSE 123* 139* 94  BUN 39* 47* 47*  CREATININE 2.41* 2.10* 2.16*  CALCIUM 9.6 9.8 9.9  MG 1.7 1.9  --   PHOS 2.9 3.3  --    Liver Function Tests: No results for input(s): AST, ALT, ALKPHOS, BILITOT, PROT, ALBUMIN in the last 72 hours. No results for input(s): LIPASE, AMYLASE in the last 72 hours. CBC:  Recent Labs  02/13/15 0355 02/14/15 0443  WBC 16.6* 12.2*  HGB 11.3*  10.7*  HCT 34.5* 33.2*  MCV 90.6 91.0  PLT 177 186   Cardiac Enzymes:  Recent Labs  02/12/15 0355  TROPONINI 4.98*   BNP: Invalid input(s): POCBNP D-Dimer: No results for input(s): DDIMER in the last 72 hours. Hemoglobin A1C: No results for input(s): HGBA1C in the last 72 hours. Fasting Lipid Panel: No results for input(s): CHOL, HDL, LDLCALC, TRIG, CHOLHDL, LDLDIRECT in the last 72 hours. Thyroid Function Tests: No results for input(s): TSH, T4TOTAL, T3FREE, THYROIDAB in the last 72 hours.  Invalid input(s): FREET3 Anemia Panel: No results for input(s): VITAMINB12, FOLATE, FERRITIN, TIBC, IRON, RETICCTPCT in the last 72 hours.  RADIOLOGY: Dg Chest 2 View  02/14/2015   CLINICAL DATA:  Congestive heart failure  EXAM: CHEST  2 VIEW  COMPARISON:  02/13/2015; 02/12/2015; 02/11/2015  FINDINGS: Grossly unchanged enlarged cardiac silhouette and mediastinal contours given persistently reduced lung volumes. The pulmonary vasculature remains distinct with cephalization of flow. Minimally improved aeration of the lung bases with residual bibasilar opacities, left greater than right. No new focal airspace opacities. Trace bilateral effusions are suspected. Stable position of support apparatus. No pneumothorax. Unchanged bones.  IMPRESSION:  Similar findings of hypoventilation and pulmonary edema with no change to minimally improved bibasilar atelectasis, left greater than right.   Electronically Signed   By: Sandi Mariscal M.D.   On: 02/14/2015 08:07   US Renal  02/10/2015   CLINICAL DATA:  Chronic kidney disease.  EXAM: RENAL / URINARY TRACT ULTRASOUND COMPLETE  COMPARISON:  None.  FINDINGS: Right Kidney:  Length: 12.6 cm. Cortical thinning. Diffusely increased echotexture. No focal abnormality or hydronephrosis.  Left Kidney:  Length: 12.3 cm. Cortical thinning. Diffusely increased echotexture. Multiple cysts, the largest in the lower pole measuring 4.4 cm. These appear simple. No hydronephrosis.   Bladder:  Appears normal for degree of bladder distention.  IMPRESSION: Cortical thinning with increased echotexture bilaterally. Findings suggestive of chronic medical renal disease. No hydronephrosis.   Electronically Signed   By: Rolm Baptise M.D.   On: 02/10/2015 09:33   Dg Chest Port 1 View  02/13/2015   CLINICAL DATA:  Acute respiratory failure.  EXAM: PORTABLE CHEST - 1 VIEW  COMPARISON:  02/12/2015.  FINDINGS: Interim removal of endotracheal tube and NG tube. Right IJ line in stable position. Cardiomegaly with bilateral pulmonary interstitial infiltrates, right side greater than left suggesting persistent congestive heart failure. Pneumonia cannot be excluded. Low lung volumes with bibasilar atelectasis. Small right pleural effusion cannot be excluded. No pneumothorax.  IMPRESSION: 1. Interim removal of endotracheal tube and NG tube. Right IJ line in stable position. 2. Cardiomegaly with persist bilateral pulmonary interstitial infiltrates, right side greater than left. Findings suggest congestive heart failure pulmonary interstitial edema. Pneumonia cannot be excluded. Associated small right pleural effusion. 3. Low lung volumes with bibasilar atelectasis.   Electronically Signed   By: Marcello Moores  Register   On: 02/13/2015 07:43   Dg Chest Port 1 View  02/12/2015   CLINICAL DATA:  Hypoxia  EXAM: PORTABLE CHEST - 1 VIEW  COMPARISON:  February 11, 2015  FINDINGS: Endotracheal tube tip is 4.6 cm above the carina. Nasogastric tube tip and side port are below the diaphragm. Central catheter tip is in the superior vena cava. No pneumothorax. There is patchy interstitial and alveolar opacity throughout the right mid and lower lung zones, stable. There is patchy interstitial opacity in the left lower lobe. The left lung is otherwise clear. Heart is upper normal in size with pulmonary vascularity within normal limits. No adenopathy.  IMPRESSION: Areas of primarily interstitial opacity in both mid and lower lung zones  with patchy airspace disease on the right in these areas. These areas appear stable compared to 1 day prior. No new opacity. No change in cardiac silhouette. Tube and catheter positions as described without pneumothorax. The appearance suggests a degree of congestive heart failure, although superimposed pneumonia may well be present as well.   Electronically Signed   By: Lowella Grip III M.D.   On: 02/12/2015 07:42   Dg Chest Port 1 View  02/11/2015   CLINICAL DATA:  Acute respiratory failure  EXAM: PORTABLE CHEST - 1 VIEW  COMPARISON:  02/11/2015  FINDINGS: Endotracheal tube in good position. NG tube in the stomach. Right jugular catheter tip in the SVC unchanged. No pneumothorax  Bilateral airspace disease with mild interval improvement. No significant effusion.  IMPRESSION: Support lines in good position.  Diffuse bilateral airspace disease with mild interval improvement.   Electronically Signed   By: Franchot Gallo M.D.   On: 02/11/2015 15:08   Dg Chest Port 1 View  02/11/2015   CLINICAL DATA:  Central line placement.  Initial encounter.  EXAM: PORTABLE CHEST - 1 VIEW  COMPARISON:  Chest radiograph performed 02/10/2015  FINDINGS: The patient's endotracheal tube is seen ending 4 cm above the carina. A right IJ line is noted ending about the mid SVC. An enteric tube is noted extending below the diaphragm.  Worsening bibasilar and right mid lung airspace opacities raise concern for worsening pulmonary edema or multifocal pneumonia. No definite pleural effusion or pneumothorax is seen.  The cardiomediastinal silhouette is borderline normal in size. No acute osseous abnormalities are identified. An external pacing pad is noted.  IMPRESSION: 1. Endotracheal tube seen ending 4 cm above the carina. 2. Right IJ line noted ending about the mid SVC. 3. Worsening bibasilar and right mid lung zone airspace opacities raise concern for worsening pulmonary edema or multifocal pneumonia.   Electronically Signed   By:  Garald Balding M.D.   On: 02/11/2015 03:46   Portable Chest Xray  02/10/2015   CLINICAL DATA:  Acute respiratory failure  EXAM: PORTABLE CHEST - 1 VIEW  COMPARISON:  02/10/2015  FINDINGS: ET tube tip is above the carina. The heart size appears normal. Nasogastric tube tip is in the stomach. Pulmonary edema pattern is improved from previous exam. Persistent lower lobe interstitial and airspace opacities identified.  IMPRESSION: 1. Interval improvement in pulmonary edema pattern.   Electronically Signed   By: Kerby Moors M.D.   On: 02/10/2015 21:05   Dg Chest Port 1 View  02/10/2015   CLINICAL DATA:  Sudden onset shortness of breath and respiratory distress  EXAM: PORTABLE CHEST - 1 VIEW  COMPARISON:  The 02/06/2015  FINDINGS: Symmetric alveolar and interstitial opacities. There could be small pleural effusions, limited at the right base due to exclusion from view.  Stable mild cardiomegaly. Vascular pedicle widening, increased from previous.  IMPRESSION: CHF with widespread alveolar edema.   Electronically Signed   By: Monte Fantasia M.D.   On: 02/10/2015 18:19   Dg Chest Portable 1 View  02/06/2015   CLINICAL DATA:  Chest pain radiating to both arms beginning today  EXAM: PORTABLE CHEST - 1 VIEW  COMPARISON:  03/20/2012  FINDINGS: Mild enlargement of the cardiomediastinal silhouette with central vascular congestion reidentified. A few interstitial Kerley B-lines are noted at the lung bases which could indicate interstitial edema. No focal pulmonary opacity allowing for technique. No pleural effusion. No acute osseous finding.  IMPRESSION: Mild enlargement of the cardiomediastinal silhouette with suggestion of early interstitial edema. If symptoms persist, consider PA and lateral chest radiographs obtained at full inspiration when the patient is clinically able.   Electronically Signed   By: Conchita Paris M.D.   On: 02/06/2015 16:45   Dg Abd 2 Views  02/06/2015   CLINICAL DATA:  81 year old male  with generalized abdominal pain and chest pain radiating to the arms since 1330 hrs. Initial encounter.  EXAM: ABDOMEN - 2 VIEW  COMPARISON:  Portable chest radiograph 1620 hr today. Lumbar MRI 10/21/2009.  FINDINGS: Upright and supine views of the abdomen and pelvis. Moderate gaseous distension of the stomach. Mild elevation of the left hemidiaphragm. No pneumoperitoneum.  Gas-filled but nondilated small and large bowel loops throughout the abdomen and pelvis. Non obstructed pattern. Osteopenia. Postoperative changes to the proximal right femur. No definite acute osseous abnormality.  IMPRESSION: 1. Gaseous distension of the stomach, and gas throughout nondilated small and large bowel loops. The appearance might reflect ileus but does not suggest a mechanical bowel obstruction. 2. No free air.   Electronically  Signed   By: Genevie Ann M.D.   On: 02/06/2015 19:52   Dg Abd Portable 1v  02/08/2015   CLINICAL DATA:  Abdominal distention.  EXAM: PORTABLE ABDOMEN - 1 VIEW  COMPARISON:  February 06, 2015.  FINDINGS: Mild gaseous distention of the stomach is again noted. Stool is noted in the right colon. No abnormal dilatation of large or small bowel is seen on this study. Stool is noted in the rectum. No abnormal calcifications are noted. Status post right hip surgery.  IMPRESSION: No evidence of bowel obstruction or ileus. Mild gastric distention of the stomach is again noted.   Electronically Signed   By: Marijo Conception, M.D.   On: 02/08/2015 10:49    PHYSICAL EXAM General: NAD Neck: JVP 12 cm, no thyromegaly or thyroid nodule.  Lungs: Slight crackles at bases bilaterally CV: Nondisplaced PMI.  Heart regular S1/S2, no S3/S4, no murmur.  1+ ankle edema.    Abdomen: Soft, nontender, no hepatosplenomegaly, no distention.  Neurologic: Alert and oriented x 3.  Psych: Normal affect. Extremities: No clubbing or cyanosis.   TELEMETRY: Reviewed telemetry pt in NSR  ASSESSMENT AND PLAN: 81 yo presented with NSTEMI,  found to have diffuse 3VD.  Developed AKI and acute primarily diastolic CHF.   1. CAD: NSTEMI.  LHC with occluded RCA, high grade LAD and LCx disease as well.  Plan for CABG when stable, possibly Tuesday.  - Continue ASA 81 and atorvastatin 80 daily.  2. Acute primarily diastolic CHF: EF [id number].  Diuresed yesterday with IV Lasix.  Remains volume overloaded.  - Will give Lasix 40 mg IV bid today, replete K.  - Follow CVP.  3. AKI on CKD: Creatinine stable.  Improved from peak.  Follow closely with diuresis.  4. ID: ?HCAP.  He is on Zosyn per CCM. WBCs coming down.  5. HTN: Will use Coreg rather than metoprolol.  Loralie Champagne 02/14/2015 11:23 AM

## 2015-02-14 NOTE — Progress Notes (Signed)
PULMONARY / CRITICAL CARE MEDICINE   Name: Zachary Mcmahon MRN: [id number] DOB: [birth year]    ADMISSION DATE:  02/06/2015 CONSULTATION DATE:  02/14/2015  REFERRING MD :  Clementeen Graham MD  CHIEF COMPLAINT:  resp distress  INITIAL PRESENTATION: 81 y.o with severe Htn, CKD, severe 3V CAD admitted6/17 with nstemI, transferred to ICU for flash pulmonary edema 6/20, on BiPAP  STUDIES:  2-D echo 6/18 >> EF of 45 -50% with akinesis of the mid apical inferior septal myocardium. Also shows grade 1 diastolic dysfunction. PFTs 6/21 >> no obs, DLCO 60% Cath >> Severe 3 vessel CAD with totally occluded RCA and severe LAD and RCA  SIGNIFICANT EVENTS: 6/21 bipap >> ETT   SUBJECTIVE:  Comfortable and up to chair Neg 851 cc  VITAL SIGNS: Temp:  [98 F (36.7 C)-100.4 F (38 C)] 98 F (36.7 C) (06/25 1219) Pulse Rate:  [66-103] 94 (06/25 1219) Resp:  [14-23] 19 (06/25 1219) BP: (124-163)/(53-82) 161/70 mmHg (06/25 1219) SpO2:  [87 %-99 %] 99 % (06/25 1219) Weight:  [93.396 kg (205 lb 14.4 oz)] 93.396 kg (205 lb 14.4 oz) (06/25 0242) HEMODYNAMICS:   VENTILATOR SETTINGS:   INTAKE / OUTPUT:  Intake/Output Summary (Last 24 hours) at 02/14/15 1248 Last data filed at 02/14/15 1108  Gross per 24 hour  Intake 1252.5 ml  Output   1600 ml  Net -347.5 ml    PHYSICAL EXAMINATION: Gen. Appears stated age, well-nourished, in no distress in chair eating ENT jvd remains, perr Neck:  JVD + Lungs:  Coarse fine Cardiovascular: Rhythm regular, heart sounds  normal, no murmurs, 1+ peripheral edema Musculoskeletal: No deformities, no cyanosis or clubbing Neuro:  alert, non focal, follows commands Skin:  Warm, no lesions/ rash   LABS:  CBC  Recent Labs Lab 02/12/15 0355 02/13/15 0355 02/14/15 0443  WBC 11.8* 16.6* 12.2*  HGB 10.6* 11.3* 10.7*  HCT 32.5* 34.5* 33.2*  PLT 141* 177 186   Coag's  Recent Labs Lab 02/09/15 0855  INR 1.16   BMET  Recent Labs Lab 02/12/15 0355 02/13/15 0355  02/14/15 0443  NA 138 138 141  K 3.8 4.1 3.4*  CL 105 104 104  CO2 24 25 28   BUN 39* 47* 47*  CREATININE 2.41* 2.10* 2.16*  GLUCOSE 123* 139* 94   Electrolytes  Recent Labs Lab 02/12/15 0355 02/13/15 0355 02/14/15 0443  CALCIUM 9.6 9.8 9.9  MG 1.7 1.9  --   PHOS 2.9 3.3  --    Sepsis Markers  Recent Labs Lab 02/11/15 0140 02/11/15 0530 02/12/15 0355 02/13/15 0355  LATICACIDVEN 1.9 1.6  --   --   PROCALCITON 0.58  --  4.54 2.56   ABG  Recent Labs Lab 02/10/15 1831 02/10/15 2046  PHART 7.168* 7.325*  PCO2ART 76.5* 45.0  PO2ART 350.0* 140.0*   Liver Enzymes  Recent Labs Lab 02/10/15 0440  AST 16  ALT 12*  ALKPHOS 69  BILITOT 0.8  ALBUMIN 2.9*   Cardiac Enzymes  Recent Labs Lab 02/11/15 0032 02/11/15 0840 02/12/15 0355  TROPONINI 0.66* 5.75* 4.98*   Glucose  Recent Labs Lab 02/13/15 1615 02/13/15 2021 02/13/15 2339 02/14/15 0424 02/14/15 0749 02/14/15 1217  GLUCAP 167* 226* 202* 94 65 215*    Imaging Dg Chest 2 View  02/14/2015   CLINICAL DATA:  Congestive heart failure  EXAM: CHEST  2 VIEW  COMPARISON:  02/13/2015; 02/12/2015; 02/11/2015  FINDINGS: Grossly unchanged enlarged cardiac silhouette and mediastinal contours given persistently reduced lung volumes. The pulmonary vasculature  remains distinct with cephalization of flow. Minimally improved aeration of the lung bases with residual bibasilar opacities, left greater than right. No new focal airspace opacities. Trace bilateral effusions are suspected. Stable position of support apparatus. No pneumothorax. Unchanged bones.  IMPRESSION: Similar findings of hypoventilation and pulmonary edema with no change to minimally improved bibasilar atelectasis, left greater than right.   Electronically Signed   By: Sandi Mariscal M.D.   On: 02/14/2015 08:07   CXR from 6/23 reviewed personally by me. No real change in his basilar infiltrates.   ASSESSMENT / PLAN:  PULMONARY OETT 6/21 >> 6/23 A:Acute  hypoxic and hypercarbic resp failure due to flash pulm edema Possible HCAP pulm edema P:   Push pulm hygiene Diuresis, has more neg balance to give  CARDIOVASCULAR  A: Acute pulm edema 3V CAD with acute NSTEMI (? primary event vs stress related) Acute systolic CHF Hypertensive emergency P:  Clonidine and cozaar stopped, metoprolol started 6/23 in addition to NTG gtt Hydralazine & lopressor IV prn IV heparin gtt NTG drip tolerated, keep plan for OR on 6/28.   RENAL A:  Acute on CKD, hypoK P:   Monitor cr & lytes, replete as needed Lasix per cards, has more to give k supp  GASTROINTESTINAL A:  Nausea/ vomiting - none P:   Zofran PO diet tolerated  HEMATOLOGIC A:  Anemia  P:  Transfuse per ICU criteria  INFECTIOUS R/o PNA  Blood Cx 6/22 >>  Resp 6/22 >> GPC, GNR >> normal flora Urine 6/22 >> negative  Vanco 6/22 >> 6/24 Zosyn 6/22 >>   A:  B infiltrates, consider possible PNA. No overt clinical signs (pct was up in setting renal insuff, fevers prior) P:   Empiric vanco and zosyn started 6/22, peel off vanco 6/24.  i think I would treat him for PNA for 7 days but narrow abx to ceftriaxone as remains culture neg Will add stop date  ENDOCRINE A:  DM - on insulin   P:   SSI No lantus with low glu 94  NEUROLOGIC A:  No issues P:   RASS goal: 0 To chair   FAMILY  - Updates: Pt, wife and her caregiver on 6/22; pt on 6/23-24, 6/25  Lavon Paganini. Titus Mould, MD, [place] Pgr: [facility] 02/14/2015 12:49 PM

## 2015-02-14 NOTE — Progress Notes (Deleted)
Spoke with [place] regarding the walker that pt was ordered for home.  Informed that wallker should be delivered to room shortly.    Carol Ada, RN

## 2015-02-14 NOTE — Progress Notes (Signed)
ANTICOAGULATION CONSULT NOTE - Follow Up Consult  Pharmacy Consult for Heparin Indication: 3V CAD  Allergies  Allergen Reactions  . Bactrim [Sulfamethoxazole-Trimethoprim] Rash    Patient Measurements: Height: 5\' 9"  (175.3 cm) Weight: 205 lb 14.4 oz (93.396 kg) IBW/kg (Calculated) : 70.7  Heparin dosing weight: 90kg  Vital Signs: Temp: 98 F (36.7 C) (06/25 1219) Temp Source: Oral (06/25 1219) BP: 161/70 mmHg (06/25 1219) Pulse Rate: 94 (06/25 1219)  Labs:  Recent Labs  02/12/15 0355  02/13/15 0130 02/13/15 0355 02/14/15 0443 02/14/15 0444  HGB 10.6*  --   --  11.3* 10.7*  --   HCT 32.5*  --   --  34.5* 33.2*  --   PLT 141*  --   --  177 186  --   HEPARINUNFRC 0.24*  < > 0.58 0.44  --  0.33  CREATININE 2.41*  --   --  2.10* 2.16*  --   TROPONINI 4.98*  --   --   --   --   --   < > = values in this interval not displayed.  Estimated Creatinine Clearance: 30.8 mL/min (by C-G formula based on Cr of 2.16).   Assessment: 81 y.o. M presented [facility] and [facility].. S/p cath (02/09/15) with multivessel CAD and on heparin. For possible CABG on Tuesday (02/17/15).  Heparin level is low therapeutic (0.33) on 2100 units/hr.  Goal of Therapy:  Heparin level 0.3-0.7 units/ml Monitor platelets by anticoagulation protocol: Yes   Plan:   Continue heparin drip at 2100 units/hr.  Daily heparin level and CBC while on heparin.   Arty Baumgartner, [place] Pager: [phone]  02/14/2015 1:35 PM

## 2015-02-15 LAB — GLUCOSE, CAPILLARY
GLUCOSE-CAPILLARY: 203 mg/dL — AB (ref 65–99)
GLUCOSE-CAPILLARY: 236 mg/dL — AB (ref 65–99)
Glucose-Capillary: 261 mg/dL — ABNORMAL HIGH (ref 65–99)
Glucose-Capillary: 160 mg/dL — ABNORMAL HIGH (ref 65–99)

## 2015-02-15 LAB — CBC
HEMATOCRIT: 33 % — AB (ref 39.0–52.0)
Hemoglobin: 10.6 g/dL — ABNORMAL LOW (ref 13.0–17.0)
MCH: 29.2 pg (ref 26.0–34.0)
MCHC: 32.1 g/dL (ref 30.0–36.0)
MCV: 90.9 fL (ref 78.0–100.0)
PLATELETS: 193 10*3/uL (ref 150–400)
RBC: 3.63 MIL/uL — ABNORMAL LOW (ref 4.22–5.81)
RDW: 13.4 % (ref 11.5–15.5)
WBC: 9 10*3/uL (ref 4.0–10.5)

## 2015-02-15 LAB — HEPARIN LEVEL (UNFRACTIONATED): Heparin Unfractionated: 0.35 IU/mL (ref 0.30–0.70)

## 2015-02-15 LAB — BASIC METABOLIC PANEL
ANION GAP: 8 (ref 5–15)
BUN: 42 mg/dL — AB (ref 6–20)
CO2: 29 mmol/L (ref 22–32)
Calcium: 9.7 mg/dL (ref 8.9–10.3)
Chloride: 104 mmol/L (ref 101–111)
Creatinine, Ser: 2.04 mg/dL — ABNORMAL HIGH (ref 0.61–1.24)
GFR calc Af Amer: 34 mL/min — ABNORMAL LOW (ref >60)
GFR calc non Af Amer: 29 mL/min — ABNORMAL LOW (ref >60)
Glucose, Bld: 163 mg/dL — ABNORMAL HIGH (ref 65–99)
Potassium: 3.4 mmol/L — ABNORMAL LOW (ref 3.5–5.1)
Sodium: 141 mmol/L (ref 135–145)

## 2015-02-15 MED ORDER — ISOSORBIDE MONONITRATE ER 30 MG PO TB24: 30.0000 mg | ORAL_TABLET | Freq: Every day | ORAL | Status: DC

## 2015-02-15 MED ORDER — POTASSIUM CHLORIDE CRYS ER 20 MEQ PO TBCR
40.0000 meq | EXTENDED_RELEASE_TABLET | Freq: Once | ORAL | Status: AC
  Administered 2015-02-15: 40 meq via ORAL
  Filled 2015-02-15: qty 2

## 2015-02-15 MED ORDER — FUROSEMIDE 10 MG/ML IJ SOLN
60.0000 mg | Freq: Two times a day (BID) | INTRAMUSCULAR | Status: DC
  Administered 2015-02-15 – 2015-02-16 (×5): 60 mg via INTRAVENOUS
  Filled 2015-02-15 (×5): qty 6

## 2015-02-15 MED ORDER — HYDRALAZINE HCL 25 MG PO TABS
25.0000 mg | ORAL_TABLET | Freq: Three times a day (TID) | ORAL | Status: DC
  Administered 2015-02-15 – 2015-02-17 (×6): 25 mg via ORAL
  Filled 2015-02-15 (×9): qty 1

## 2015-02-15 MED ORDER — FUROSEMIDE 10 MG/ML IJ SOLN
INTRAMUSCULAR | Status: AC
  Administered 2015-02-16: 60 mg via INTRAVENOUS
  Filled 2015-02-15: qty 2

## 2015-02-15 MED ORDER — HYPROMELLOSE (GONIOSCOPIC) 2.5 % OP SOLN
1.0000 [drp] | Freq: Three times a day (TID) | OPHTHALMIC | Status: DC | PRN
  Administered 2015-02-15 – 2015-02-16 (×2): 1 [drp] via OPHTHALMIC
  Filled 2015-02-15: qty 15

## 2015-02-15 MED ORDER — FUROSEMIDE 10 MG/ML IJ SOLN
INTRAMUSCULAR | Status: AC
  Administered 2015-02-15: 60 mg via INTRAVENOUS
  Filled 2015-02-15: qty 2

## 2015-02-15 NOTE — Progress Notes (Signed)
Patient ID: Zachary Mcmahon, male   DOB: 12-15-1931, 81 y.o.   MRN: [id number]   SUBJECTIVE: Episode of "GERD" last night.  No chest pain today.  Remains on NTG gtt and heparin gtt.  Creatinine stable, diuresed some with IV Lasix yesterday but not vigorously.  BP high.  CVP 10.  Antibiotics transitioned over to ceftriaxone.   Scheduled Meds: . aspirin  81 mg Per Tube Daily  . atorvastatin  80 mg Oral q1800  . carvedilol  12.5 mg Oral BID WC  . cefTRIAXone (ROCEPHIN)  IV  1 g Intravenous Q24H  . furosemide  60 mg Intravenous BID  . gabapentin  100 mg Oral TID  . hydrALAZINE  25 mg Oral 3 times per day  . insulin aspart  0-15 Units Subcutaneous TID WC  . insulin aspart  0-5 Units Subcutaneous QHS  . insulin detemir  36 Units Subcutaneous QHS  . isosorbide mononitrate  30 mg Oral Daily  . potassium chloride  40 mEq Oral Once  . sodium chloride  3 mL Intravenous Q12H   Continuous Infusions: . heparin 2,100 Units/hr (02/15/15 0700)  . nitroGLYCERIN 20 mcg/min (02/15/15 0700)   PRN Meds:.sodium chloride, acetaminophen **OR** acetaminophen (TYLENOL) oral liquid 160 mg/5 mL, alum & mag hydroxide-simeth, metoprolol, morphine injection, ondansetron (ZOFRAN) IV, sodium chloride    Filed Vitals:   02/15/15 0400 02/15/15 0600 02/15/15 0733 02/15/15 0819  BP: 153/67 170/87 173/80   Pulse: 70 87 86 116  Temp:    99 F (37.2 C)  TempSrc:    Oral  Resp: 16 17 20 19   Height:      Weight:      SpO2: 98% 99% 99% 98%    Intake/Output Summary (Last 24 hours) at 02/15/15 0938 Last data filed at 02/15/15 0800  Gross per 24 hour  Intake 1387.5 ml  Output   2275 ml  Net -887.5 ml    LABS: Basic Metabolic Panel:  Recent Labs  02/13/15 0355 02/14/15 0443 02/15/15 0315  NA 138 141 141  K 4.1 3.4* 3.4*  CL 104 104 104  CO2 25 28 29   GLUCOSE 139* 94 163*  BUN 47* 47* 42*  CREATININE 2.10* 2.16* 2.04*  CALCIUM 9.8 9.9 9.7  MG 1.9  --   --   PHOS 3.3  --   --    Liver Function  Tests: No results for input(s): AST, ALT, ALKPHOS, BILITOT, PROT, ALBUMIN in the last 72 hours. No results for input(s): LIPASE, AMYLASE in the last 72 hours. CBC:  Recent Labs  02/14/15 0443 02/15/15 0315  WBC 12.2* 9.0  HGB 10.7* 10.6*  HCT 33.2* 33.0*  MCV 91.0 90.9  PLT 186 193   Cardiac Enzymes: No results for input(s): CKTOTAL, CKMB, CKMBINDEX, TROPONINI in the last 72 hours. BNP: Invalid input(s): POCBNP D-Dimer: No results for input(s): DDIMER in the last 72 hours. Hemoglobin A1C: No results for input(s): HGBA1C in the last 72 hours. Fasting Lipid Panel: No results for input(s): CHOL, HDL, LDLCALC, TRIG, CHOLHDL, LDLDIRECT in the last 72 hours. Thyroid Function Tests: No results for input(s): TSH, T4TOTAL, T3FREE, THYROIDAB in the last 72 hours.  Invalid input(s): FREET3 Anemia Panel: No results for input(s): VITAMINB12, FOLATE, FERRITIN, TIBC, IRON, RETICCTPCT in the last 72 hours.  RADIOLOGY: Dg Chest 2 View  02/14/2015   CLINICAL DATA:  Congestive heart failure  EXAM: CHEST  2 VIEW  COMPARISON:  02/13/2015; 02/12/2015; 02/11/2015  FINDINGS: Grossly unchanged enlarged cardiac silhouette and mediastinal contours  given persistently reduced lung volumes. The pulmonary vasculature remains distinct with cephalization of flow. Minimally improved aeration of the lung bases with residual bibasilar opacities, left greater than right. No new focal airspace opacities. Trace bilateral effusions are suspected. Stable position of support apparatus. No pneumothorax. Unchanged bones.  IMPRESSION: Similar findings of hypoventilation and pulmonary edema with no change to minimally improved bibasilar atelectasis, left greater than right.   Electronically Signed   By: Sandi Mariscal M.D.   On: 02/14/2015 08:07   US Renal  02/10/2015   CLINICAL DATA:  Chronic kidney disease.  EXAM: RENAL / URINARY TRACT ULTRASOUND COMPLETE  COMPARISON:  None.  FINDINGS: Right Kidney:  Length: 12.6 cm. Cortical  thinning. Diffusely increased echotexture. No focal abnormality or hydronephrosis.  Left Kidney:  Length: 12.3 cm. Cortical thinning. Diffusely increased echotexture. Multiple cysts, the largest in the lower pole measuring 4.4 cm. These appear simple. No hydronephrosis.  Bladder:  Appears normal for degree of bladder distention.  IMPRESSION: Cortical thinning with increased echotexture bilaterally. Findings suggestive of chronic medical renal disease. No hydronephrosis.   Electronically Signed   By: Rolm Baptise M.D.   On: 02/10/2015 09:33   Dg Chest Port 1 View  02/13/2015   CLINICAL DATA:  Acute respiratory failure.  EXAM: PORTABLE CHEST - 1 VIEW  COMPARISON:  02/12/2015.  FINDINGS: Interim removal of endotracheal tube and NG tube. Right IJ line in stable position. Cardiomegaly with bilateral pulmonary interstitial infiltrates, right side greater than left suggesting persistent congestive heart failure. Pneumonia cannot be excluded. Low lung volumes with bibasilar atelectasis. Small right pleural effusion cannot be excluded. No pneumothorax.  IMPRESSION: 1. Interim removal of endotracheal tube and NG tube. Right IJ line in stable position. 2. Cardiomegaly with persist bilateral pulmonary interstitial infiltrates, right side greater than left. Findings suggest congestive heart failure pulmonary interstitial edema. Pneumonia cannot be excluded. Associated small right pleural effusion. 3. Low lung volumes with bibasilar atelectasis.   Electronically Signed   By: Marcello Moores  Register   On: 02/13/2015 07:43   Dg Chest Port 1 View  02/12/2015   CLINICAL DATA:  Hypoxia  EXAM: PORTABLE CHEST - 1 VIEW  COMPARISON:  February 11, 2015  FINDINGS: Endotracheal tube tip is 4.6 cm above the carina. Nasogastric tube tip and side port are below the diaphragm. Central catheter tip is in the superior vena cava. No pneumothorax. There is patchy interstitial and alveolar opacity throughout the right mid and lower lung zones, stable.  There is patchy interstitial opacity in the left lower lobe. The left lung is otherwise clear. Heart is upper normal in size with pulmonary vascularity within normal limits. No adenopathy.  IMPRESSION: Areas of primarily interstitial opacity in both mid and lower lung zones with patchy airspace disease on the right in these areas. These areas appear stable compared to 1 day prior. No new opacity. No change in cardiac silhouette. Tube and catheter positions as described without pneumothorax. The appearance suggests a degree of congestive heart failure, although superimposed pneumonia may well be present as well.   Electronically Signed   By: Lowella Grip III M.D.   On: 02/12/2015 07:42   Dg Chest Port 1 View  02/11/2015   CLINICAL DATA:  Acute respiratory failure  EXAM: PORTABLE CHEST - 1 VIEW  COMPARISON:  02/11/2015  FINDINGS: Endotracheal tube in good position. NG tube in the stomach. Right jugular catheter tip in the SVC unchanged. No pneumothorax  Bilateral airspace disease with mild interval improvement. No significant  effusion.  IMPRESSION: Support lines in good position.  Diffuse bilateral airspace disease with mild interval improvement.   Electronically Signed   By: Franchot Gallo M.D.   On: 02/11/2015 15:08   Dg Chest Port 1 View  02/11/2015   CLINICAL DATA:  Central line placement.  Initial encounter.  EXAM: PORTABLE CHEST - 1 VIEW  COMPARISON:  Chest radiograph performed 02/10/2015  FINDINGS: The patient's endotracheal tube is seen ending 4 cm above the carina. A right IJ line is noted ending about the mid SVC. An enteric tube is noted extending below the diaphragm.  Worsening bibasilar and right mid lung airspace opacities raise concern for worsening pulmonary edema or multifocal pneumonia. No definite pleural effusion or pneumothorax is seen.  The cardiomediastinal silhouette is borderline normal in size. No acute osseous abnormalities are identified. An external pacing pad is noted.   IMPRESSION: 1. Endotracheal tube seen ending 4 cm above the carina. 2. Right IJ line noted ending about the mid SVC. 3. Worsening bibasilar and right mid lung zone airspace opacities raise concern for worsening pulmonary edema or multifocal pneumonia.   Electronically Signed   By: Garald Balding M.D.   On: 02/11/2015 03:46   Portable Chest Xray  02/10/2015   CLINICAL DATA:  Acute respiratory failure  EXAM: PORTABLE CHEST - 1 VIEW  COMPARISON:  02/10/2015  FINDINGS: ET tube tip is above the carina. The heart size appears normal. Nasogastric tube tip is in the stomach. Pulmonary edema pattern is improved from previous exam. Persistent lower lobe interstitial and airspace opacities identified.  IMPRESSION: 1. Interval improvement in pulmonary edema pattern.   Electronically Signed   By: Kerby Moors M.D.   On: 02/10/2015 21:05   Dg Chest Port 1 View  02/10/2015   CLINICAL DATA:  Sudden onset shortness of breath and respiratory distress  EXAM: PORTABLE CHEST - 1 VIEW  COMPARISON:  The 02/06/2015  FINDINGS: Symmetric alveolar and interstitial opacities. There could be small pleural effusions, limited at the right base due to exclusion from view.  Stable mild cardiomegaly. Vascular pedicle widening, increased from previous.  IMPRESSION: CHF with widespread alveolar edema.   Electronically Signed   By: Monte Fantasia M.D.   On: 02/10/2015 18:19   Dg Chest Portable 1 View  02/06/2015   CLINICAL DATA:  Chest pain radiating to both arms beginning today  EXAM: PORTABLE CHEST - 1 VIEW  COMPARISON:  03/20/2012  FINDINGS: Mild enlargement of the cardiomediastinal silhouette with central vascular congestion reidentified. A few interstitial Kerley B-lines are noted at the lung bases which could indicate interstitial edema. No focal pulmonary opacity allowing for technique. No pleural effusion. No acute osseous finding.  IMPRESSION: Mild enlargement of the cardiomediastinal silhouette with suggestion of early  interstitial edema. If symptoms persist, consider PA and lateral chest radiographs obtained at full inspiration when the patient is clinically able.   Electronically Signed   By: Conchita Paris M.D.   On: 02/06/2015 16:45   Dg Abd 2 Views  02/06/2015   CLINICAL DATA:  80 year old male with generalized abdominal pain and chest pain radiating to the arms since 1330 hrs. Initial encounter.  EXAM: ABDOMEN - 2 VIEW  COMPARISON:  Portable chest radiograph 1620 hr today. Lumbar MRI 10/21/2009.  FINDINGS: Upright and supine views of the abdomen and pelvis. Moderate gaseous distension of the stomach. Mild elevation of the left hemidiaphragm. No pneumoperitoneum.  Gas-filled but nondilated small and large bowel loops throughout the abdomen and pelvis. Non obstructed  pattern. Osteopenia. Postoperative changes to the proximal right femur. No definite acute osseous abnormality.  IMPRESSION: 1. Gaseous distension of the stomach, and gas throughout nondilated small and large bowel loops. The appearance might reflect ileus but does not suggest a mechanical bowel obstruction. 2. No free air.   Electronically Signed   By: Genevie Ann M.D.   On: 02/06/2015 19:52   Dg Abd Portable 1v  02/08/2015   CLINICAL DATA:  Abdominal distention.  EXAM: PORTABLE ABDOMEN - 1 VIEW  COMPARISON:  February 06, 2015.  FINDINGS: Mild gaseous distention of the stomach is again noted. Stool is noted in the right colon. No abnormal dilatation of large or small bowel is seen on this study. Stool is noted in the rectum. No abnormal calcifications are noted. Status post right hip surgery.  IMPRESSION: No evidence of bowel obstruction or ileus. Mild gastric distention of the stomach is again noted.   Electronically Signed   By: Marijo Conception, M.D.   On: 02/08/2015 10:49    PHYSICAL EXAM General: NAD Neck: JVP 10 cm, no thyromegaly or thyroid nodule.  Lungs: Slight crackles at bases bilaterally CV: Nondisplaced PMI.  Heart regular S1/S2, no S3/S4, no  murmur.  Trace ankle edema.    Abdomen: Soft, nontender, no hepatosplenomegaly, no distention.  Neurologic: Alert and oriented x 3.  Psych: Normal affect. Extremities: No clubbing or cyanosis.   TELEMETRY: Reviewed telemetry pt in NSR  ASSESSMENT AND PLAN: 81 yo presented with NSTEMI, found to have diffuse 3VD.  Developed AKI and acute primarily diastolic CHF.   1. CAD: NSTEMI.  LHC with occluded RCA, high grade LAD and LCx disease as well.  Plan for CABG when stable, possibly Tuesday.  - Continue ASA 81 and atorvastatin 80 daily.  2. Acute primarily diastolic CHF: EF [id number].  Some diuresis yesterday, weight down 1 lb.  Remains volume overloaded.  - Lasix 60 mg IV bid today, replace K. - Follow CVP.  3. AKI on CKD: Creatinine stable.  Improved from peak.  Follow closely with diuresis.  4. ID: ?HCAP.  He has been transitioned to ceftriaxone to complete 7 days abx.  5. HTN: Add hydralazine 25 mg tid today.  Loralie Champagne 02/15/2015 9:38 AM

## 2015-02-15 NOTE — Progress Notes (Signed)
ANTICOAGULATION CONSULT NOTE - Follow Up Consult  Pharmacy Consult for Heparin Indication: 3V CAD  Allergies  Allergen Reactions  . Bactrim [Sulfamethoxazole-Trimethoprim] Rash    Patient Measurements: Height: 5\' 9"  (175.3 cm) Weight: 204 lb 2.3 oz (92.6 kg) IBW/kg (Calculated) : 70.7  Heparin dosing weight: 90kg  Vital Signs: Temp: 98.3 F (36.8 C) (06/26 1132) Temp Source: Oral (06/26 1132) BP: 170/83 mmHg (06/26 1132) Pulse Rate: 97 (06/26 1132)  Labs:  Recent Labs  02/13/15 0355 02/14/15 0443 02/14/15 0444 02/15/15 0315 02/15/15 1350  HGB 11.3* 10.7*  --  10.6*  --   HCT 34.5* 33.2*  --  33.0*  --   PLT 177 186  --  193  --   HEPARINUNFRC 0.44  --  0.33 <0.10* 0.35  CREATININE 2.10* 2.16*  --  2.04*  --     Estimated Creatinine Clearance: 32.5 mL/min (by C-G formula based on Cr of 2.04).   Assessment: 81 y.o. M presented to [facility] with CP and transferred to [facility].. S/p cath (02/09/15) with multivessel CAD and on heparin. For possible CABG on Tuesday (02/17/15).  Heparin level undetectable (< 0.1) this am, but RN reported need to change IV site overnight. Heparin continued at same rate. Level this afternoon is remains low therapeutic (0.35) on 2100 units/hr.  Goal of Therapy:  Heparin level 0.3-0.7 units/ml Monitor platelets by anticoagulation protocol: Yes   Plan:   Continue heparin drip at 2100 units/hr.  Daily heparin level and CBC while on heparin.   Arty Baumgartner, [place] Pager: [phone]  02/15/2015 3:55 PM

## 2015-02-15 NOTE — Progress Notes (Signed)
6 Days Post-Op Procedure(s) (LRB): Left Heart Cath and Coronary Angiography (N/A) Subjective: Had some "reflux" last night No complaints this AM  Objective: Vital signs in last 24 hours: Temp:  [98 F (36.7 C)-99 F (37.2 C)] 99 F (37.2 C) (06/26 0819) Pulse Rate:  [66-116] 116 (06/26 0819) Cardiac Rhythm:  [-] Normal sinus rhythm (06/26 0800) Resp:  [15-24] 19 (06/26 0819) BP: (135-178)/(57-87) 173/80 mmHg (06/26 0733) SpO2:  [93 %-99 %] 98 % (06/26 0819) Weight:  [204 lb 2.3 oz (92.6 kg)] 204 lb 2.3 oz (92.6 kg) (06/26 0311)  Hemodynamic parameters for last 24 hours: CVP:  [1 mmHg-10 mmHg] 10 mmHg  Intake/Output from previous day: 06/25 0701 - 06/26 0700 In: 1440.5 [P.O.:540; I.V.:800.5; IV Piggyback:100] Out: 2075 [Urine:2075] Intake/Output this shift: Total I/O In: 27 [I.V.:27] Out: 200 [Urine:200]  General appearance: alert, cooperative and no distress Lungs: clear to auscultation bilaterally  Lab Results:  Recent Labs  02/14/15 0443 02/15/15 0315  WBC 12.2* 9.0  HGB 10.7* 10.6*  HCT 33.2* 33.0*  PLT 186 193   BMET:  Recent Labs  02/14/15 0443 02/15/15 0315  NA 141 141  K 3.4* 3.4*  CL 104 104  CO2 28 29  GLUCOSE 94 163*  BUN 47* 42*  CREATININE 2.16* 2.04*  CALCIUM 9.9 9.7    PT/INR: No results for input(s): LABPROT, INR in the last 72 hours. ABG    Component Value Date/Time   PHART 7.325* 02/10/2015 2046   HCO3 23.4 02/10/2015 2046   TCO2 25 02/10/2015 2046   ACIDBASEDEF 3.0* 02/10/2015 2046   O2SAT 99.0 02/10/2015 2046   CBG (last 3)   Recent Labs  02/14/15 1634 02/14/15 2155 02/15/15 0911  GLUCAP 269* 159* 203*    Assessment/Plan: S/P Procedure(s) (LRB): Left Heart Cath and Coronary Angiography (N/A) -  Creatinine continues to improve Afebrile on ceftriaxone Lungs sound reasonably good on exam Recheck CXR in AM   LOS: 9 days    Melrose Nakayama 02/15/2015

## 2015-02-16 ENCOUNTER — Inpatient Hospital Stay (HOSPITAL_COMMUNITY): Payer: Medicare Other

## 2015-02-16 DIAGNOSIS — I25110 Atherosclerotic heart disease of native coronary artery with unstable angina pectoris: Secondary | ICD-10-CM

## 2015-02-16 LAB — POCT I-STAT 3, ART BLOOD GAS (G3+)
ACID-BASE EXCESS: 7 mmol/L — AB (ref 0.0–2.0)
Bicarbonate: 30.6 mEq/L — ABNORMAL HIGH (ref 20.0–24.0)
O2 Saturation: 95 %
Patient temperature: 98.1
TCO2: 32 mmol/L (ref 0–100)
pCO2 arterial: 38.3 mmHg (ref 35.0–45.0)
pH, Arterial: 7.51 — ABNORMAL HIGH (ref 7.350–7.450)
pO2, Arterial: 68 mmHg — ABNORMAL LOW (ref 80.0–100.0)

## 2015-02-16 LAB — PROTIME-INR
INR: 1.21 (ref 0.00–1.49)
Prothrombin Time: 15.5 seconds — ABNORMAL HIGH (ref 11.6–15.2)

## 2015-02-16 LAB — CULTURE, BLOOD (ROUTINE X 2)
CULTURE: NO GROWTH
Culture: NO GROWTH

## 2015-02-16 LAB — CBC
HEMATOCRIT: 33.9 % — AB (ref 39.0–52.0)
HEMOGLOBIN: 10.8 g/dL — AB (ref 13.0–17.0)
MCH: 29.3 pg (ref 26.0–34.0)
MCHC: 31.9 g/dL (ref 30.0–36.0)
MCV: 91.9 fL (ref 78.0–100.0)
Platelets: 196 10*3/uL (ref 150–400)
RBC: 3.69 MIL/uL — ABNORMAL LOW (ref 4.22–5.81)
RDW: 13.6 % (ref 11.5–15.5)
WBC: 9.8 10*3/uL (ref 4.0–10.5)

## 2015-02-16 LAB — GLUCOSE, CAPILLARY
GLUCOSE-CAPILLARY: 189 mg/dL — AB (ref 65–99)
Glucose-Capillary: 153 mg/dL — ABNORMAL HIGH (ref 65–99)
Glucose-Capillary: 190 mg/dL — ABNORMAL HIGH (ref 65–99)
Glucose-Capillary: 261 mg/dL — ABNORMAL HIGH (ref 65–99)

## 2015-02-16 LAB — BASIC METABOLIC PANEL
ANION GAP: 8 (ref 5–15)
BUN: 36 mg/dL — AB (ref 6–20)
CO2: 30 mmol/L (ref 22–32)
Calcium: 9.8 mg/dL (ref 8.9–10.3)
Chloride: 103 mmol/L (ref 101–111)
Creatinine, Ser: 1.8 mg/dL — ABNORMAL HIGH (ref 0.61–1.24)
GFR, EST AFRICAN AMERICAN: 39 mL/min — AB (ref >60)
GFR, EST NON AFRICAN AMERICAN: 34 mL/min — AB (ref >60)
Glucose, Bld: 161 mg/dL — ABNORMAL HIGH (ref 65–99)
POTASSIUM: 3.4 mmol/L — AB (ref 3.5–5.1)
Sodium: 141 mmol/L (ref 135–145)

## 2015-02-16 LAB — MAGNESIUM: MAGNESIUM: 1.8 mg/dL (ref 1.7–2.4)

## 2015-02-16 LAB — HEPARIN LEVEL (UNFRACTIONATED): HEPARIN UNFRACTIONATED: 0.49 [IU]/mL (ref 0.30–0.70)

## 2015-02-16 LAB — APTT: aPTT: 186 seconds — ABNORMAL HIGH (ref 24–37)

## 2015-02-16 MED ORDER — POTASSIUM CHLORIDE CRYS ER 20 MEQ PO TBCR
40.0000 meq | EXTENDED_RELEASE_TABLET | Freq: Once | ORAL | Status: AC
  Administered 2015-02-16: 40 meq via ORAL

## 2015-02-16 MED ORDER — POTASSIUM CHLORIDE CRYS ER 10 MEQ PO TBCR
EXTENDED_RELEASE_TABLET | ORAL | Status: AC
  Filled 2015-02-16: qty 4

## 2015-02-16 MED ORDER — CARVEDILOL 25 MG PO TABS
25.0000 mg | ORAL_TABLET | Freq: Two times a day (BID) | ORAL | Status: DC
  Administered 2015-02-16: 25 mg via ORAL
  Filled 2015-02-16 (×4): qty 1

## 2015-02-16 MED ORDER — DEXTROSE 5 % IV SOLN
1.5000 g | INTRAVENOUS | Status: AC
  Administered 2015-02-17: .75 g via INTRAVENOUS
  Administered 2015-02-17: 1.5 g via INTRAVENOUS
  Filled 2015-02-16 (×2): qty 1.5

## 2015-02-16 MED ORDER — SODIUM CHLORIDE 0.9 % IV SOLN
INTRAVENOUS | Status: AC
  Administered 2015-02-17: 1 [IU]/h via INTRAVENOUS
  Filled 2015-02-16: qty 2.5

## 2015-02-16 MED ORDER — PLASMA-LYTE 148 IV SOLN
INTRAVENOUS | Status: AC
  Administered 2015-02-17: 500 mL
  Filled 2015-02-16: qty 2.5

## 2015-02-16 MED ORDER — TEMAZEPAM 15 MG PO CAPS
15.0000 mg | ORAL_CAPSULE | Freq: Once | ORAL | Status: AC | PRN
  Administered 2015-02-16: 15 mg via ORAL
  Filled 2015-02-16: qty 1

## 2015-02-16 MED ORDER — EPINEPHRINE HCL 1 MG/ML IJ SOLN
0.0000 ug/min | INTRAVENOUS | Status: DC
  Filled 2015-02-16: qty 4

## 2015-02-16 MED ORDER — BISACODYL 5 MG PO TBEC
5.0000 mg | DELAYED_RELEASE_TABLET | Freq: Once | ORAL | Status: AC
  Administered 2015-02-16: 5 mg via ORAL
  Filled 2015-02-16: qty 1

## 2015-02-16 MED ORDER — POTASSIUM CHLORIDE 2 MEQ/ML IV SOLN
80.0000 meq | INTRAVENOUS | Status: DC
  Filled 2015-02-16: qty 40

## 2015-02-16 MED ORDER — CHLORHEXIDINE GLUCONATE CLOTH 2 % EX PADS
6.0000 | MEDICATED_PAD | Freq: Once | CUTANEOUS | Status: AC
  Administered 2015-02-16: 6 via TOPICAL

## 2015-02-16 MED ORDER — SODIUM CHLORIDE 0.9 % IV SOLN
INTRAVENOUS | Status: AC
  Administered 2015-02-17: 69.8 mL/h via INTRAVENOUS
  Filled 2015-02-16: qty 40

## 2015-02-16 MED ORDER — DEXTROSE 5 % IV SOLN
30.0000 ug/min | INTRAVENOUS | Status: DC
  Filled 2015-02-16: qty 2

## 2015-02-16 MED ORDER — MAGNESIUM SULFATE 50 % IJ SOLN
40.0000 meq | INTRAMUSCULAR | Status: DC
Start: 2015-02-17 — End: 2015-02-17
  Filled 2015-02-16: qty 10

## 2015-02-16 MED ORDER — VANCOMYCIN HCL 10 G IV SOLR
1500.0000 mg | INTRAVENOUS | Status: AC
  Administered 2015-02-17: 1500 mg via INTRAVENOUS
  Filled 2015-02-16 (×2): qty 1500

## 2015-02-16 MED ORDER — NITROGLYCERIN IN D5W 200-5 MCG/ML-% IV SOLN
2.0000 ug/min | INTRAVENOUS | Status: DC
  Filled 2015-02-16: qty 250

## 2015-02-16 MED ORDER — DOPAMINE-DEXTROSE 3.2-5 MG/ML-% IV SOLN
0.0000 ug/kg/min | INTRAVENOUS | Status: DC
  Filled 2015-02-16: qty 250

## 2015-02-16 MED ORDER — DEXTROSE 5 % IV SOLN
750.0000 mg | INTRAVENOUS | Status: DC
  Filled 2015-02-16: qty 750

## 2015-02-16 MED ORDER — DEXMEDETOMIDINE HCL IN NACL 400 MCG/100ML IV SOLN
0.1000 ug/kg/h | INTRAVENOUS | Status: AC
  Administered 2015-02-17: .2 ug/kg/h via INTRAVENOUS
  Filled 2015-02-16: qty 100

## 2015-02-16 MED ORDER — CHLORHEXIDINE GLUCONATE CLOTH 2 % EX PADS
6.0000 | MEDICATED_PAD | Freq: Once | CUTANEOUS | Status: AC
  Administered 2015-02-17: 6 via TOPICAL

## 2015-02-16 MED ORDER — SODIUM CHLORIDE 0.9 % IV SOLN
INTRAVENOUS | Status: DC
  Filled 2015-02-16: qty 30

## 2015-02-16 NOTE — Progress Notes (Signed)
Radiology to pick up 2h27.  Pt unable to go at this time r/t runs VT and labs pending.  Day Rn will call radiology when able to go.  Will continue to monitor. Saunders Revel T

## 2015-02-16 NOTE — Progress Notes (Signed)
Patient ID: Zachary Mcmahon, male   DOB: 16-Jul-1932, 81 y.o.   MRN: [id number] [facility] DAILY ICU PROGRESS NOTE                   [address]            [place] [address]          [phone]   7 Days Post-Op Procedure(s) (LRB): Left Heart Cath and Coronary Angiography (N/A)  Total Length of Stay:  LOS: 10 days   Subjective: Up in chair, no complaints this evening,respiratory improved  Objective: Vital signs in last 24 hours: Temp:  [98.2 F (36.8 C)-98.4 F (36.9 C)] 98.3 F (36.8 C) (06/27 1100) Pulse Rate:  [68-115] 83 (06/27 1600) Cardiac Rhythm:  [-] Normal sinus rhythm (06/27 0800) Resp:  [8-28] 15 (06/27 1600) BP: (125-184)/(45-122) 153/63 mmHg (06/27 1600) SpO2:  [96 %-100 %] 96 % (06/27 1600) Weight:  [201 lb 8 oz (91.4 kg)] 201 lb 8 oz (91.4 kg) (06/27 0313)  Filed Weights   02/14/15 0242 02/15/15 0311 02/16/15 0313  Weight: 205 lb 14.4 oz (93.396 kg) 204 lb 2.3 oz (92.6 kg) 201 lb 8 oz (91.4 kg)    Weight change: -2 lb 10.3 oz (-1.2 kg)   Hemodynamic parameters for last 24 hours: CVP:  [4 mmHg-7 mmHg] 5 mmHg  Intake/Output from previous day: 06/26 0701 - 06/27 0700 In: 956.6 [P.O.:600; I.V.:356.6] Out: 2078 [Urine:2076; Stool:2]  Intake/Output this shift: Total I/O In: 297 [I.V.:297] Out: 725 [Urine:725]  Current Meds: Scheduled Meds: . [START ON 02/17/2015] aminocaproic acid (AMICAR) for OHS   Intravenous To OR  . aspirin  81 mg Per Tube Daily  . atorvastatin  80 mg Oral q1800  . carvedilol  25 mg Oral BID WC  . cefTRIAXone (ROCEPHIN)  IV  1 g Intravenous Q24H  . [START ON 02/17/2015] cefUROXime (ZINACEF)  IV  1.5 g Intravenous To OR  . [START ON 02/17/2015] cefUROXime (ZINACEF)  IV  750 mg Intravenous To OR  . [START ON 02/17/2015] dexmedetomidine  0.1-0.7 mcg/kg/hr Intravenous To OR  . [START ON 02/17/2015] DOPamine  0-10 mcg/kg/min Intravenous To OR  . [START ON 02/17/2015] epinephrine  0-10 mcg/min Intravenous To OR  . furosemide  60  mg Intravenous BID  . gabapentin  100 mg Oral TID  . [START ON 02/17/2015] heparin-papaverine-plasmalyte irrigation   Irrigation To OR  . [START ON 02/17/2015] heparin 30,000 units/NS 1000 mL solution for CELLSAVER   Other To OR  . hydrALAZINE  25 mg Oral Q8H  . insulin aspart  0-15 Units Subcutaneous TID WC  . insulin aspart  0-5 Units Subcutaneous QHS  . insulin detemir  36 Units Subcutaneous QHS  . [START ON 02/17/2015] insulin (NOVOLIN-R) infusion   Intravenous To OR  . [START ON 02/17/2015] magnesium sulfate  40 mEq Other To OR  . [START ON 02/17/2015] nitroGLYCERIN  2-200 mcg/min Intravenous To OR  . [START ON 02/17/2015] phenylephrine (NEO-SYNEPHRINE) Adult infusion  30-200 mcg/min Intravenous To OR  . [START ON 02/17/2015] potassium chloride  80 mEq Other To OR  . sodium chloride  3 mL Intravenous Q12H  . [START ON 02/17/2015] vancomycin  1,500 mg Intravenous To OR   Continuous Infusions: . heparin 2,100 Units/hr (02/16/15 0800)  . nitroGLYCERIN 40 mcg/min (02/16/15 0800)   PRN Meds:.sodium chloride, acetaminophen **OR** acetaminophen (TYLENOL) oral liquid 160 mg/5 mL, alum & mag hydroxide-simeth, hydroxypropyl methylcellulose / hypromellose, metoprolol, morphine injection, ondansetron (ZOFRAN) IV,  sodium chloride, temazepam  General appearance: alert, cooperative, appears older than stated age and no distress Neurologic: intact Heart: regular rate and rhythm, S1, S2 normal, no murmur, click, rub or gallop Lungs: diminished breath sounds bibasilar Abdomen: soft, non-tender; bowel sounds normal; no masses,  no organomegaly Extremities: extremities normal, atraumatic, no cyanosis or edema and Homans sign is negative, no sign of DVT  Lab Results: CBC: Recent Labs  02/15/15 0315 02/16/15 0315  WBC 9.0 9.8  HGB 10.6* 10.8*  HCT 33.0* 33.9*  PLT 193 196   BMET:  Recent Labs  02/15/15 0315 02/16/15 0315  NA 141 141  K 3.4* 3.4*  CL 104 103  CO2 29 30  GLUCOSE 163* 161*  BUN  42* 36*  CREATININE 2.04* 1.80*  CALCIUM 9.7 9.8    PT/INR: No results for input(s): LABPROT, INR in the last 72 hours. Radiology: Dg Chest Port 1v Same Day  02/16/2015   CLINICAL DATA:  CHF  EXAM: PORTABLE CHEST - 1 VIEW SAME DAY  COMPARISON:  02/14/2015  FINDINGS: Right central line remains in place, unchanged. Heart is borderline in size. Vascular congestion and interstitial prominence, slightly improved since prior study. No confluent opacity or visible effusions. No acute bony abnormality.  IMPRESSION: Slight improvement in pulmonary edema pattern.   Electronically Signed   By: Rolm Baptise M.D.   On: 02/16/2015 10:00     Assessment/Plan: S/P Procedure(s) (LRB): Left Heart Cath and Coronary Angiography (N/A) Has improved some from several days ago, with cr back to baseline, still high risk CABG but doubt he will get better. Patient and family are aware of increased risk of cabg patient is willing to proceed.  The goals risks and alternatives of the planned surgical procedure cabg have been discussed with the patient in detail. The risks of the procedure including death, infection, stroke, myocardial infarction, bleeding, blood transfusion have all been discussed specifically.  I have quoted Zachary Mcmahon a 8-10 % % of perioperative mortality and a complication rate as high as 60 %. The patient's questions have been answered.Zachary Mcmahon is willing  to proceed with the planned procedure.  [name] MD Beeper [phone] Office [phone] 02/16/2015 6:49 PM       [name] 02/16/2015 6:44 PM

## 2015-02-16 NOTE — Progress Notes (Signed)
Pt's bp 158/76.  Pain free.  Notified lab to add on mag level.  Will continue to monitor. Saunders Revel T

## 2015-02-16 NOTE — Progress Notes (Signed)
  RN paged stating patient had a 12 beat run of NSVT. K is 3.4. Order given for 40 mEq of PO K. Mg lab ordered.   SIMMONS, Silas Flood 02/16/2015

## 2015-02-16 NOTE — Progress Notes (Signed)
ANTICOAGULATION CONSULT NOTE - Follow Up Consult  Pharmacy Consult for Heparin Indication: 3V CAD  Allergies  Allergen Reactions  . Bactrim [Sulfamethoxazole-Trimethoprim] Rash    Patient Measurements: Height: 5\' 9"  (175.3 cm) Weight: 201 lb 8 oz (91.4 kg) IBW/kg (Calculated) : 70.7  Heparin dosing weight: 90kg  Vital Signs: Temp: 98.2 F (36.8 C) (06/27 0730) Temp Source: Oral (06/27 0730) BP: 125/62 mmHg (06/27 1100) Pulse Rate: 84 (06/27 1100)  Labs:  Recent Labs  02/14/15 0443  02/15/15 0315 02/15/15 1350 02/16/15 0315  HGB 10.7*  --  10.6*  --  10.8*  HCT 33.2*  --  33.0*  --  33.9*  PLT 186  --  193  --  196  HEPARINUNFRC  --   < > <0.10* 0.35 0.49  CREATININE 2.16*  --  2.04*  --  1.80*  < > = values in this interval not displayed.  Estimated Creatinine Clearance: 36.6 mL/min (by C-G formula based on Cr of 1.8).   Assessment: 81 y.o. M presented to [facility] with CP and transferred to [facility].. S/p cath (02/09/15) with multivessel CAD and on heparin. For CABG on Tuesday (02/17/15).   -Heparin level is at goal (HL= 0.49) on 2100 units/hr, Hg= 10.8 and stable, plt= 196.  Goal of Therapy:  Heparin level 0.3-0.7 units/ml Monitor platelets by anticoagulation protocol: Yes   Plan:   Continue heparin drip at 2100 units/hr.  Daily heparin level and CBC while on heparin.  Hildred Laser, Pharm D 02/16/2015 11:31 AM

## 2015-02-16 NOTE — Progress Notes (Signed)
Inpatient Diabetes Program Recommendations  AACE/ADA: New Consensus Statement on Inpatient Glycemic Control (2013)  Target Ranges:  Prepandial:   less than 140 mg/dL      Peak postprandial:   less than 180 mg/dL (1-2 hours)      Critically ill patients:  140 - 180 mg/dL   Results for SERAFINO, HADEN (MRN [id number]) as of 02/16/2015 09:31  Ref. Range 02/15/2015 09:11 02/15/2015 11:34 02/15/2015 16:23 02/15/2015 21:24 02/16/2015 08:03  Glucose-Capillary Latest Ref Range: 65-99 mg/dL 203 (H) 261 (H) 160 (H) 236 (H) 153 (H)   Current orders for Inpatient glycemic control: Levemir 36 units QHS, Novolog 0-15 units TID with meals, Novolog 0-5 units HS  Inpatient Diabetes Program Recommendations Insulin - Meal Coverage: According to the chart, patient is eating at least 50% of meals and post prandial glucose is consistently elevated. Please consider ordering Novolog 4 units TID with meals for meal coverage (in addition to Novolog correction scale).  Thanks, Barnie Alderman, RN, MSN, CCRN, CDE Diabetes Coordinator Inpatient Diabetes Program [phone] (Team Pager from [place] to [place]) [phone] ([facility] office) [phone] [facility] office) [phone] [facility] office)

## 2015-02-16 NOTE — Progress Notes (Signed)
Patient ID: Zachary Mcmahon, male   DOB: Sep 10, 1931, 81 y.o.   MRN: [id number]   SUBJECTIVE: Episode of "GERD" this am.  Took Maalox and IV Ntg increased with relief. Became hypertensive with pain and had a 21 beat run of NSVT.   Remains on NTG gtt and heparin gtt.  Now pain free.  Scheduled Meds: . aspirin  81 mg Per Tube Daily  . atorvastatin  80 mg Oral q1800  . carvedilol  25 mg Oral BID WC  . cefTRIAXone (ROCEPHIN)  IV  1 g Intravenous Q24H  . furosemide  60 mg Intravenous BID  . gabapentin  100 mg Oral TID  . hydrALAZINE  25 mg Oral Q8H  . insulin aspart  0-15 Units Subcutaneous TID WC  . insulin aspart  0-5 Units Subcutaneous QHS  . insulin detemir  36 Units Subcutaneous QHS  . potassium chloride      . sodium chloride  3 mL Intravenous Q12H   Continuous Infusions: . heparin 2,100 Units/hr (02/15/15 2248)  . nitroGLYCERIN 40 mcg/min (02/16/15 0608)   PRN Meds:.sodium chloride, acetaminophen **OR** acetaminophen (TYLENOL) oral liquid 160 mg/5 mL, alum & mag hydroxide-simeth, hydroxypropyl methylcellulose / hypromellose, metoprolol, morphine injection, ondansetron (ZOFRAN) IV, sodium chloride    Filed Vitals:   02/16/15 0645 02/16/15 0700 02/16/15 0715 02/16/15 0730  BP: 170/76 170/83 161/74 166/69  Pulse: 90 95 88 85  Temp:    98.2 F (36.8 C)  TempSrc:    Oral  Resp: 17 16 15 15   Height:      Weight:      SpO2: 100% 100% 98% 99%    Intake/Output Summary (Last 24 hours) at 02/16/15 0845 Last data filed at 02/16/15 [id number]  Gross per 24 hour  Intake  898.2 ml  Output   1878 ml  Net -979.8 ml    LABS: Basic Metabolic Panel:  Recent Labs  02/15/15 0315 02/16/15 0315  NA 141 141  K 3.4* 3.4*  CL 104 103  CO2 29 30  GLUCOSE 163* 161*  BUN 42* 36*  CREATININE 2.04* 1.80*  CALCIUM 9.7 9.8  MG  --  1.8   Liver Function Tests: No results for input(s): AST, ALT, ALKPHOS, BILITOT, PROT, ALBUMIN in the last 72 hours. No results for input(s): LIPASE, AMYLASE in  the last 72 hours. CBC:  Recent Labs  02/15/15 0315 02/16/15 0315  WBC 9.0 9.8  HGB 10.6* 10.8*  HCT 33.0* 33.9*  MCV 90.9 91.9  PLT 193 196   Cardiac Enzymes: No results for input(s): CKTOTAL, CKMB, CKMBINDEX, TROPONINI in the last 72 hours. BNP: Invalid input(s): POCBNP D-Dimer: No results for input(s): DDIMER in the last 72 hours. Hemoglobin A1C: No results for input(s): HGBA1C in the last 72 hours. Fasting Lipid Panel: No results for input(s): CHOL, HDL, LDLCALC, TRIG, CHOLHDL, LDLDIRECT in the last 72 hours. Thyroid Function Tests: No results for input(s): TSH, T4TOTAL, T3FREE, THYROIDAB in the last 72 hours.  Invalid input(s): FREET3 Anemia Panel: No results for input(s): VITAMINB12, FOLATE, FERRITIN, TIBC, IRON, RETICCTPCT in the last 72 hours.  RADIOLOGY: Dg Chest 2 View  02/14/2015   CLINICAL DATA:  Congestive heart failure  EXAM: CHEST  2 VIEW  COMPARISON:  02/13/2015; 02/12/2015; 02/11/2015  FINDINGS: Grossly unchanged enlarged cardiac silhouette and mediastinal contours given persistently reduced lung volumes. The pulmonary vasculature remains distinct with cephalization of flow. Minimally improved aeration of the lung bases with residual bibasilar opacities, left greater than right. No new focal airspace opacities.  Trace bilateral effusions are suspected. Stable position of support apparatus. No pneumothorax. Unchanged bones.  IMPRESSION: Similar findings of hypoventilation and pulmonary edema with no change to minimally improved bibasilar atelectasis, left greater than right.   Electronically Signed   By: Sandi Mariscal M.D.   On: 02/14/2015 08:07   US Renal  02/10/2015   CLINICAL DATA:  Chronic kidney disease.  EXAM: RENAL / URINARY TRACT ULTRASOUND COMPLETE  COMPARISON:  None.  FINDINGS: Right Kidney:  Length: 12.6 cm. Cortical thinning. Diffusely increased echotexture. No focal abnormality or hydronephrosis.  Left Kidney:  Length: 12.3 cm. Cortical thinning. Diffusely  increased echotexture. Multiple cysts, the largest in the lower pole measuring 4.4 cm. These appear simple. No hydronephrosis.  Bladder:  Appears normal for degree of bladder distention.  IMPRESSION: Cortical thinning with increased echotexture bilaterally. Findings suggestive of chronic medical renal disease. No hydronephrosis.   Electronically Signed   By: Rolm Baptise M.D.   On: 02/10/2015 09:33   Dg Chest Port 1 View  02/13/2015   CLINICAL DATA:  Acute respiratory failure.  EXAM: PORTABLE CHEST - 1 VIEW  COMPARISON:  02/12/2015.  FINDINGS: Interim removal of endotracheal tube and NG tube. Right IJ line in stable position. Cardiomegaly with bilateral pulmonary interstitial infiltrates, right side greater than left suggesting persistent congestive heart failure. Pneumonia cannot be excluded. Low lung volumes with bibasilar atelectasis. Small right pleural effusion cannot be excluded. No pneumothorax.  IMPRESSION: 1. Interim removal of endotracheal tube and NG tube. Right IJ line in stable position. 2. Cardiomegaly with persist bilateral pulmonary interstitial infiltrates, right side greater than left. Findings suggest congestive heart failure pulmonary interstitial edema. Pneumonia cannot be excluded. Associated small right pleural effusion. 3. Low lung volumes with bibasilar atelectasis.   Electronically Signed   By: Marcello Moores  Register   On: 02/13/2015 07:43   Dg Chest Port 1 View  02/12/2015   CLINICAL DATA:  Hypoxia  EXAM: PORTABLE CHEST - 1 VIEW  COMPARISON:  February 11, 2015  FINDINGS: Endotracheal tube tip is 4.6 cm above the carina. Nasogastric tube tip and side port are below the diaphragm. Central catheter tip is in the superior vena cava. No pneumothorax. There is patchy interstitial and alveolar opacity throughout the right mid and lower lung zones, stable. There is patchy interstitial opacity in the left lower lobe. The left lung is otherwise clear. Heart is upper normal in size with pulmonary  vascularity within normal limits. No adenopathy.  IMPRESSION: Areas of primarily interstitial opacity in both mid and lower lung zones with patchy airspace disease on the right in these areas. These areas appear stable compared to 1 day prior. No new opacity. No change in cardiac silhouette. Tube and catheter positions as described without pneumothorax. The appearance suggests a degree of congestive heart failure, although superimposed pneumonia may well be present as well.   Electronically Signed   By: Lowella Grip III M.D.   On: 02/12/2015 07:42   Dg Chest Port 1 View  02/11/2015   CLINICAL DATA:  Acute respiratory failure  EXAM: PORTABLE CHEST - 1 VIEW  COMPARISON:  02/11/2015  FINDINGS: Endotracheal tube in good position. NG tube in the stomach. Right jugular catheter tip in the SVC unchanged. No pneumothorax  Bilateral airspace disease with mild interval improvement. No significant effusion.  IMPRESSION: Support lines in good position.  Diffuse bilateral airspace disease with mild interval improvement.   Electronically Signed   By: Franchot Gallo M.D.   On: 02/11/2015 15:08  Dg Chest Port 1 View  02/11/2015   CLINICAL DATA:  Central line placement.  Initial encounter.  EXAM: PORTABLE CHEST - 1 VIEW  COMPARISON:  Chest radiograph performed 02/10/2015  FINDINGS: The patient's endotracheal tube is seen ending 4 cm above the carina. A right IJ line is noted ending about the mid SVC. An enteric tube is noted extending below the diaphragm.  Worsening bibasilar and right mid lung airspace opacities raise concern for worsening pulmonary edema or multifocal pneumonia. No definite pleural effusion or pneumothorax is seen.  The cardiomediastinal silhouette is borderline normal in size. No acute osseous abnormalities are identified. An external pacing pad is noted.  IMPRESSION: 1. Endotracheal tube seen ending 4 cm above the carina. 2. Right IJ line noted ending about the mid SVC. 3. Worsening bibasilar and  right mid lung zone airspace opacities raise concern for worsening pulmonary edema or multifocal pneumonia.   Electronically Signed   By: Garald Balding M.D.   On: 02/11/2015 03:46   Portable Chest Xray  02/10/2015   CLINICAL DATA:  Acute respiratory failure  EXAM: PORTABLE CHEST - 1 VIEW  COMPARISON:  02/10/2015  FINDINGS: ET tube tip is above the carina. The heart size appears normal. Nasogastric tube tip is in the stomach. Pulmonary edema pattern is improved from previous exam. Persistent lower lobe interstitial and airspace opacities identified.  IMPRESSION: 1. Interval improvement in pulmonary edema pattern.   Electronically Signed   By: Kerby Moors M.D.   On: 02/10/2015 21:05   Dg Chest Port 1 View  02/10/2015   CLINICAL DATA:  Sudden onset shortness of breath and respiratory distress  EXAM: PORTABLE CHEST - 1 VIEW  COMPARISON:  The 02/06/2015  FINDINGS: Symmetric alveolar and interstitial opacities. There could be small pleural effusions, limited at the right base due to exclusion from view.  Stable mild cardiomegaly. Vascular pedicle widening, increased from previous.  IMPRESSION: CHF with widespread alveolar edema.   Electronically Signed   By: Monte Fantasia M.D.   On: 02/10/2015 18:19   Dg Chest Portable 1 View  02/06/2015   CLINICAL DATA:  Chest pain radiating to both arms beginning today  EXAM: PORTABLE CHEST - 1 VIEW  COMPARISON:  03/20/2012  FINDINGS: Mild enlargement of the cardiomediastinal silhouette with central vascular congestion reidentified. A few interstitial Kerley B-lines are noted at the lung bases which could indicate interstitial edema. No focal pulmonary opacity allowing for technique. No pleural effusion. No acute osseous finding.  IMPRESSION: Mild enlargement of the cardiomediastinal silhouette with suggestion of early interstitial edema. If symptoms persist, consider PA and lateral chest radiographs obtained at full inspiration when the patient is clinically able.    Electronically Signed   By: Conchita Paris M.D.   On: 02/06/2015 16:45   Dg Abd 2 Views  02/06/2015   CLINICAL DATA:  81 year old male with generalized abdominal pain and chest pain radiating to the arms since 1330 hrs. Initial encounter.  EXAM: ABDOMEN - 2 VIEW  COMPARISON:  Portable chest radiograph 1620 hr today. Lumbar MRI 10/21/2009.  FINDINGS: Upright and supine views of the abdomen and pelvis. Moderate gaseous distension of the stomach. Mild elevation of the left hemidiaphragm. No pneumoperitoneum.  Gas-filled but nondilated small and large bowel loops throughout the abdomen and pelvis. Non obstructed pattern. Osteopenia. Postoperative changes to the proximal right femur. No definite acute osseous abnormality.  IMPRESSION: 1. Gaseous distension of the stomach, and gas throughout nondilated small and large bowel loops. The appearance might reflect  ileus but does not suggest a mechanical bowel obstruction. 2. No free air.   Electronically Signed   By: Genevie Ann M.D.   On: 02/06/2015 19:52   Dg Abd Portable 1v  02/08/2015   CLINICAL DATA:  Abdominal distention.  EXAM: PORTABLE ABDOMEN - 1 VIEW  COMPARISON:  February 06, 2015.  FINDINGS: Mild gaseous distention of the stomach is again noted. Stool is noted in the right colon. No abnormal dilatation of large or small bowel is seen on this study. Stool is noted in the rectum. No abnormal calcifications are noted. Status post right hip surgery.  IMPRESSION: No evidence of bowel obstruction or ileus. Mild gastric distention of the stomach is again noted.   Electronically Signed   By: Marijo Conception, M.D.   On: 02/08/2015 10:49    PHYSICAL EXAM General: NAD Neck: JVP 7 cm, no thyromegaly or thyroid nodule.  Lungs: Slight crackles at bases bilaterally CV: Nondisplaced PMI.  Heart regular S1/S2, no S3/S4, no murmur.  Trace ankle edema.    Abdomen: Soft, nontender, no hepatosplenomegaly, no distention.  Neurologic: Alert and oriented x 3.  Psych: Normal  affect. Extremities: No clubbing or cyanosis.   TELEMETRY: Reviewed telemetry pt in NSR, NSVT as noted.  ASSESSMENT AND PLAN: 81 yo presented with NSTEMI, found to have diffuse 3VD.  Developed AKI and acute primarily diastolic CHF.   1. CAD: NSTEMI.  LHC with occluded RCA, high grade LAD and LCx disease as well.  Plan for CABG when stable, probably Tuesday. Patient continues to have daily chest pain that he thinks is GERD but I suspect is more angina. IV Ntg increased. Will increase Coreg to 25 mg bid.  - Continue ASA 81 and atorvastatin 80 daily.  2. Acute primarily diastolic CHF: EF [id number].  Some diuresis yesterday, weight down 2.5 lb.  CVP down to 7 mm Hg.  - Lasix 60 mg IV bid today, replace K. - Follow CVP.  3. AKI on CKD: Creatinine improved.  Follow closely with diuresis.  4. ID: ?HCAP.  He has been transitioned to ceftriaxone to complete 7 days abx.  5. HTN: increase Coreg today. 6. NSVT secondary to ischemia. Increase beta blocker. Potassium repleted.   Luana Shu, [facility] 02/16/2015 8:45 AM

## 2015-02-16 NOTE — Anesthesia Preprocedure Evaluation (Addendum)
Anesthesia Evaluation  Patient identified by MRN, date of birth, ID band Patient awake    Reviewed: Allergy & Precautions, H&P , NPO status , Patient's Chart, lab work & pertinent test results  History of Anesthesia Complications Negative for: history of anesthetic complications  Airway Mallampati: II  TM Distance: >3 FB Neck ROM: Full    Dental no notable dental hx. (+) Teeth Intact, Missing   Pulmonary pneumonia -,  breath sounds clear to auscultation  Pulmonary exam normal       Cardiovascular hypertension, Pt. on medications + angina + CAD, + Past MI, + Peripheral Vascular Disease and +CHF Normal cardiovascular examRhythm:Regular Rate:Normal  Echo 01/2015 - Left ventricle: The cavity size was normal. There was mild focalbasal hypertrophy of the septum. Systolic function was mildlyreduced. The estimated ejection fraction was in the range of 45%to 50%. There is akinesis of the mid-apicalinferoseptalmyocardium. Doppler parameters are consistent with abnormal leftventricular relaxation (grade 1 diastolic dysfunction). - Mitral valve: There was mild regurgitation. - Left atrium: The atrium was mildly dilated. - Right atrium: The atrium was mildly dilated.  Impressions: - EF is slightly improved when compared to prior echocardiogram.     Neuro/Psych    GI/Hepatic GERD-  Controlled,  Endo/Other  diabetes, Well Controlled, Type 2, Insulin Dependent  Renal/GU Renal InsufficiencyRenal disease     Musculoskeletal  (+) Arthritis -,   Abdominal   Peds  Hematology   Anesthesia Other Findings   Reproductive/Obstetrics                           Anesthesia Physical  Anesthesia Plan  ASA: IV  Anesthesia Plan: General   Post-op Pain Management:    Induction: Intravenous  Airway Management Planned: Oral ETT  Additional Equipment: Arterial line, PA Cath, 3D TEE, Ultrasound Guidance Line  Placement, TEE and CVP  Intra-op Plan:   Post-operative Plan: Post-operative intubation/ventilation  Informed Consent: I have reviewed the patients History and Physical, chart, labs and discussed the procedure including the risks, benefits and alternatives for the proposed anesthesia with the patient or authorized representative who has indicated his/her understanding and acceptance.   Dental advisory given  Plan Discussed with: CRNA  Anesthesia Plan Comments:         Anesthesia Quick Evaluation

## 2015-02-16 NOTE — Progress Notes (Signed)
PT Cancellation Note  Patient Details Name: Zachary Mcmahon MRN: [id number] DOB: 08-May-1932   Cancelled Treatment:    Reason Eval/Treat Not Completed: Other (comment) (pt with SVT this am and chest pain, currently resting and RN request defer til later today. Will reattempt as time allows)   Lanetta Inch [facility] 02/16/2015, 7:59 AM Elwyn Reach, [place]

## 2015-02-16 NOTE — Progress Notes (Addendum)
Nutrition Follow-up  DOCUMENTATION CODES:  Not applicable  INTERVENTION:   (Continue with Heart Healthy diet)  NUTRITION DIAGNOSIS:  Inadequate oral intake related to inability to eat as evidenced by NPO status.  Resolved  GOAL:  Patient will meet greater than or equal to 90% of their needs  Progressing  MONITOR:  PO intake, Supplement acceptance, Labs, Weight trends, I & O's, Skin  REASON FOR ASSESSMENT:  Ventilator    ASSESSMENT: 81 y.o with severe Htn, CKD, severe 3V CAD admitted6/17 with nstemI, transferred to ICU for flash pulmonary edema 6/20, on BiPAP pt vomited and failed then intubated.   Pt extubated on 02/12/15. Nutritional needs re-estimated due to change in status.   Pt has been advanced a Heart Healthy diet; tolerating well, consuming 50-75% of meals.  Pt is continuing to diurese on IV lasix, noted a 6# wt loss since 02/11/15.   Per cardiology notes, plan for possible CABG on 02/17/15 if infection and pulmonary function improve.   Labs reviewed. K: 3.4 (on supplement)  Height:  Ht Readings from Last 1 Encounters:  02/07/15 5\' 9"  (1.753 m)    Weight:  Wt Readings from Last 1 Encounters:  02/16/15 201 lb 8 oz (91.4 kg)    Ideal Body Weight:  72.7 kg  Wt Readings from Last 10 Encounters:  02/16/15 201 lb 8 oz (91.4 kg)  04/23/14 212 lb (96.163 kg)  10/23/12 206 lb (93.441 kg)  07/11/12 199 lb 8 oz (90.493 kg)  03/21/12 233 lb 14.4 oz (106.096 kg)    BMI:  Body mass index is 29.74 kg/(m^2).  Estimated Nutritional Needs:  Kcal:  1800-2000  Protein:  90-100 grams  Fluid:  1.8-2.0 L  Skin:  Reviewed, no issues (ecchymosis)  Diet Order:  Diet Heart Room service appropriate?: Yes; Fluid consistency:: Thin  EDUCATION NEEDS:  No education needs identified at this time   Intake/Output Summary (Last 24 hours) at 02/16/15 1058 Last data filed at 02/16/15 1000  Gross per 24 hour  Intake 1028.6 ml  Output   2052 ml  Net -1023.4 ml     Last BM:  02/16/15  Trevino Wyatt A. Jimmye Norman, RD, LDN, CDE Pager: [phone] After hours Pager: [phone]

## 2015-02-16 NOTE — Progress Notes (Signed)
md paged Pt c/o heart burn reflux requesting maalox.  maalox given.  Pt had 12 beat run.  Pt sbp 180's.  Placed on 4l [place], titrated  ntg gtt to 30 mcg.  Pt c/o of lower abd to mid discomfort.   sbp 162/122 discomfort improving 1/10.  Increased ntg gtt to 40 mcg.   0610 sbp = 160/82.  Pt alert, talking.and pain free.  Will continue to monitor. Saunders Revel T

## 2015-02-16 NOTE — Progress Notes (Signed)
Physical Therapy Treatment Patient Details Name: Zachary Mcmahon MRN: [id number] DOB: [birth year] Today's Date: 03-10-15    History of Present Illness 81 yo male h/o dm, htn, abnormal myoview testing in 2013, no previous heart cath comes in with chest pain across his lower chest along with generalized abdominal pain. Pt with NSTEMI, and pulmonary edema, VDRF 6/21-6/23    PT Comments    Pt moving well with increased gait distance and activity today. Pt with improved cognition and more interractive and conversational today. HR 98-115 with gait with sats 93-96% on RA, BP 138/68 end of session. Pt educated for sternal precautions and mobility post CABG. Will continue to follow. Max assist for pericare after toileting. Pt reports incontinence at baseline with pad generally used daily.   Follow Up Recommendations  Home health PT     Equipment Recommendations       Recommendations for Other Services       Precautions / Restrictions Precautions Precautions: Fall    Mobility  Bed Mobility               General bed mobility comments: BSC on arrival  Transfers       Sit to Stand: Supervision         General transfer comment: cues for hand placement with practice x 2 without UE assist for preparation for CABG, x 2 with UE assist  Ambulation/Gait Ambulation/Gait assistance: Supervision Ambulation Distance (Feet): 400 Feet Assistive device: Rolling walker (2 wheeled) Gait Pattern/deviations: Step-through pattern;Decreased stride length;Trunk flexed   Gait velocity interpretation: Below normal speed for age/gender General Gait Details: cues for posture and position in RW   Stairs            Wheelchair Mobility    Modified Rankin (Stroke Patients Only)       Balance Overall balance assessment: Needs assistance   Sitting balance-Leahy Scale: Good       Standing balance-Leahy Scale: Fair                      Cognition Arousal/Alertness:  Awake/alert Behavior During Therapy: WFL for tasks assessed/performed Overall Cognitive Status: Within Functional Limits for tasks assessed                      Exercises      General Comments        Pertinent Vitals/Pain Pain Assessment: No/denies pain    Home Living                      Prior Function            PT Goals (current goals can now be found in the care plan section) Progress towards PT goals: Progressing toward goals    Frequency       PT Plan Current plan remains appropriate    Co-evaluation             End of Session   Activity Tolerance: Patient tolerated treatment well Patient left: in chair;with call bell/phone within reach     Time: 1355-1431 PT Time Calculation (min) (ACUTE ONLY): 36 min  Charges:  $Gait Training: 8-22 mins $Therapeutic Activity: 8-22 mins                    G Codes:      Melford Aase 03-10-15, 2:36 PM Elwyn Reach, [place]

## 2015-02-17 ENCOUNTER — Inpatient Hospital Stay (HOSPITAL_COMMUNITY): Payer: Medicare Other

## 2015-02-17 ENCOUNTER — Inpatient Hospital Stay (HOSPITAL_COMMUNITY): Payer: Medicare Other | Admitting: Anesthesiology

## 2015-02-17 ENCOUNTER — Encounter (HOSPITAL_COMMUNITY): Payer: Self-pay | Admitting: Anesthesiology

## 2015-02-17 ENCOUNTER — Encounter (HOSPITAL_COMMUNITY)
Admission: EM | Disposition: A | Discharge status: Skilled Nursing Facility | Payer: Medicare Other | Source: Home / Self Care | Attending: Cardiothoracic Surgery

## 2015-02-17 DIAGNOSIS — Z951 Presence of aortocoronary bypass graft: Secondary | ICD-10-CM

## 2015-02-17 HISTORY — PX: TEE WITHOUT CARDIOVERSION: SHX5443

## 2015-02-17 HISTORY — PX: CORONARY ARTERY BYPASS GRAFT: SHX141

## 2015-02-17 HISTORY — PX: OTHER SURGICAL HISTORY: SHX169

## 2015-02-17 LAB — CBC
HCT: 29.6 % — ABNORMAL LOW (ref 39.0–52.0)
HCT: 30.6 % — ABNORMAL LOW (ref 39.0–52.0)
HEMATOCRIT: 33.1 % — AB (ref 39.0–52.0)
HEMOGLOBIN: 9.8 g/dL — AB (ref 13.0–17.0)
Hemoglobin: 10.7 g/dL — ABNORMAL LOW (ref 13.0–17.0)
Hemoglobin: 9.9 g/dL — ABNORMAL LOW (ref 13.0–17.0)
MCH: 30.1 pg (ref 26.0–34.0)
MCH: 30.2 pg (ref 26.0–34.0)
MCH: 29.6 pg (ref 26.0–34.0)
MCHC: 32.3 g/dL (ref 30.0–36.0)
MCHC: 33.1 g/dL (ref 30.0–36.0)
MCHC: 32.4 g/dL (ref 30.0–36.0)
MCV: 93 fL (ref 78.0–100.0)
MCV: 91.1 fL (ref 78.0–100.0)
MCV: 91.6 fL (ref 78.0–100.0)
PLATELETS: 175 10*3/uL (ref 150–400)
Platelets: 198 10*3/uL (ref 150–400)
Platelets: 148 10*3/uL — ABNORMAL LOW (ref 150–400)
RBC: 3.56 MIL/uL — ABNORMAL LOW (ref 4.22–5.81)
RBC: 3.25 MIL/uL — AB (ref 4.22–5.81)
RBC: 3.34 MIL/uL — ABNORMAL LOW (ref 4.22–5.81)
RDW: 13.6 % (ref 11.5–15.5)
RDW: 13.6 % (ref 11.5–15.5)
RDW: 13.6 % (ref 11.5–15.5)
WBC: 12.4 10*3/uL — ABNORMAL HIGH (ref 4.0–10.5)
WBC: 21.5 10*3/uL — AB (ref 4.0–10.5)
WBC: 17 10*3/uL — ABNORMAL HIGH (ref 4.0–10.5)

## 2015-02-17 LAB — POCT I-STAT 4, (NA,K, GLUC, HGB,HCT)
Glucose, Bld: 163 mg/dL — ABNORMAL HIGH (ref 65–99)
HEMATOCRIT: 30 % — AB (ref 39.0–52.0)
HEMOGLOBIN: 10.2 g/dL — AB (ref 13.0–17.0)
POTASSIUM: 3.3 mmol/L — AB (ref 3.5–5.1)
Sodium: 140 mmol/L (ref 135–145)

## 2015-02-17 LAB — POCT I-STAT 3, ART BLOOD GAS (G3+)
ACID-BASE EXCESS: 5 mmol/L — AB (ref 0.0–2.0)
ACID-BASE EXCESS: 5 mmol/L — AB (ref 0.0–2.0)
ACID-BASE EXCESS: 3 mmol/L — AB (ref 0.0–2.0)
Acid-Base Excess: 1 mmol/L (ref 0.0–2.0)
BICARBONATE: 28 meq/L — AB (ref 20.0–24.0)
BICARBONATE: 28.7 meq/L — AB (ref 20.0–24.0)
BICARBONATE: 28 meq/L — AB (ref 20.0–24.0)
Bicarbonate: 26 mEq/L — ABNORMAL HIGH (ref 20.0–24.0)
Bicarbonate: 25.4 mEq/L — ABNORMAL HIGH (ref 20.0–24.0)
O2 SAT: 97 %
O2 SAT: 94 %
O2 Saturation: 100 %
O2 Saturation: 100 %
O2 Saturation: 92 %
PCO2 ART: 43.2 mmHg (ref 35.0–45.0)
PH ART: 7.434 (ref 7.350–7.450)
PO2 ART: 357 mmHg — AB (ref 80.0–100.0)
PO2 ART: 60 mmHg — AB (ref 80.0–100.0)
PO2 ART: 80 mmHg (ref 80.0–100.0)
Patient temperature: 36.1
Patient temperature: 37.6
Patient temperature: 37.9
TCO2: 29 mmol/L (ref 0–100)
TCO2: 30 mmol/L (ref 0–100)
TCO2: 29 mmol/L (ref 0–100)
TCO2: 27 mmol/L (ref 0–100)
TCO2: 27 mmol/L (ref 0–100)
pCO2 arterial: 35.3 mmHg (ref 35.0–45.0)
pCO2 arterial: 36.8 mmHg (ref 35.0–45.0)
pCO2 arterial: 41.4 mmHg (ref 35.0–45.0)
pCO2 arterial: 46.3 mmHg — ABNORMAL HIGH (ref 35.0–45.0)
pH, Arterial: 7.506 — ABNORMAL HIGH (ref 7.350–7.450)
pH, Arterial: 7.5 — ABNORMAL HIGH (ref 7.350–7.450)
pH, Arterial: 7.389 (ref 7.350–7.450)
pH, Arterial: 7.352 (ref 7.350–7.450)
pO2, Arterial: 298 mmHg — ABNORMAL HIGH (ref 80.0–100.0)
pO2, Arterial: 90 mmHg (ref 80.0–100.0)

## 2015-02-17 LAB — CREATININE, SERUM
Creatinine, Ser: 1.95 mg/dL — ABNORMAL HIGH (ref 0.61–1.24)
GFR calc Af Amer: 36 mL/min — ABNORMAL LOW (ref >60)
GFR calc non Af Amer: 31 mL/min — ABNORMAL LOW (ref >60)

## 2015-02-17 LAB — GLUCOSE, CAPILLARY
GLUCOSE-CAPILLARY: 130 mg/dL — AB (ref 65–99)
GLUCOSE-CAPILLARY: 122 mg/dL — AB (ref 65–99)
GLUCOSE-CAPILLARY: 119 mg/dL — AB (ref 65–99)
Glucose-Capillary: 129 mg/dL — ABNORMAL HIGH (ref 65–99)
Glucose-Capillary: 106 mg/dL — ABNORMAL HIGH (ref 65–99)
Glucose-Capillary: 170 mg/dL — ABNORMAL HIGH (ref 65–99)
Glucose-Capillary: 182 mg/dL — ABNORMAL HIGH (ref 65–99)
Glucose-Capillary: 202 mg/dL — ABNORMAL HIGH (ref 65–99)
Glucose-Capillary: 184 mg/dL — ABNORMAL HIGH (ref 65–99)

## 2015-02-17 LAB — POCT I-STAT, CHEM 8
BUN: 33 mg/dL — ABNORMAL HIGH (ref 6–20)
BUN: 33 mg/dL — AB (ref 6–20)
BUN: 31 mg/dL — AB (ref 6–20)
BUN: 32 mg/dL — AB (ref 6–20)
BUN: 31 mg/dL — ABNORMAL HIGH (ref 6–20)
BUN: 32 mg/dL — ABNORMAL HIGH (ref 6–20)
CALCIUM ION: 1.38 mmol/L — AB (ref 1.13–1.30)
CALCIUM ION: 1.35 mmol/L — AB (ref 1.13–1.30)
CALCIUM ION: 1.23 mmol/L (ref 1.13–1.30)
CALCIUM ION: 1.32 mmol/L — AB (ref 1.13–1.30)
CALCIUM ION: 1.28 mmol/L (ref 1.13–1.30)
CHLORIDE: 100 mmol/L — AB (ref 101–111)
CHLORIDE: 100 mmol/L — AB (ref 101–111)
CHLORIDE: 99 mmol/L — AB (ref 101–111)
CREATININE: 1.6 mg/dL — AB (ref 0.61–1.24)
CREATININE: 1.5 mg/dL — AB (ref 0.61–1.24)
CREATININE: 1.9 mg/dL — AB (ref 0.61–1.24)
Calcium, Ion: 1.35 mmol/L — ABNORMAL HIGH (ref 1.13–1.30)
Chloride: 100 mmol/L — ABNORMAL LOW (ref 101–111)
Chloride: 98 mmol/L — ABNORMAL LOW (ref 101–111)
Chloride: 102 mmol/L (ref 101–111)
Creatinine, Ser: 1.6 mg/dL — ABNORMAL HIGH (ref 0.61–1.24)
Creatinine, Ser: 1.6 mg/dL — ABNORMAL HIGH (ref 0.61–1.24)
Creatinine, Ser: 1.7 mg/dL — ABNORMAL HIGH (ref 0.61–1.24)
GLUCOSE: 152 mg/dL — AB (ref 65–99)
GLUCOSE: 156 mg/dL — AB (ref 65–99)
GLUCOSE: 180 mg/dL — AB (ref 65–99)
Glucose, Bld: 163 mg/dL — ABNORMAL HIGH (ref 65–99)
Glucose, Bld: 134 mg/dL — ABNORMAL HIGH (ref 65–99)
Glucose, Bld: 169 mg/dL — ABNORMAL HIGH (ref 65–99)
HCT: 28 % — ABNORMAL LOW (ref 39.0–52.0)
HCT: 23 % — ABNORMAL LOW (ref 39.0–52.0)
HCT: 25 % — ABNORMAL LOW (ref 39.0–52.0)
HEMATOCRIT: 32 % — AB (ref 39.0–52.0)
HEMATOCRIT: 27 % — AB (ref 39.0–52.0)
HEMATOCRIT: 28 % — AB (ref 39.0–52.0)
HEMOGLOBIN: 10.9 g/dL — AB (ref 13.0–17.0)
HEMOGLOBIN: 8.5 g/dL — AB (ref 13.0–17.0)
Hemoglobin: 9.5 g/dL — ABNORMAL LOW (ref 13.0–17.0)
Hemoglobin: 7.8 g/dL — ABNORMAL LOW (ref 13.0–17.0)
Hemoglobin: 9.2 g/dL — ABNORMAL LOW (ref 13.0–17.0)
Hemoglobin: 9.5 g/dL — ABNORMAL LOW (ref 13.0–17.0)
POTASSIUM: 4.3 mmol/L (ref 3.5–5.1)
Potassium: 3.5 mmol/L (ref 3.5–5.1)
Potassium: 3.5 mmol/L (ref 3.5–5.1)
Potassium: 3.3 mmol/L — ABNORMAL LOW (ref 3.5–5.1)
Potassium: 3.5 mmol/L (ref 3.5–5.1)
Potassium: 3.3 mmol/L — ABNORMAL LOW (ref 3.5–5.1)
SODIUM: 139 mmol/L (ref 135–145)
Sodium: 139 mmol/L (ref 135–145)
Sodium: 139 mmol/L (ref 135–145)
Sodium: 139 mmol/L (ref 135–145)
Sodium: 139 mmol/L (ref 135–145)
Sodium: 139 mmol/L (ref 135–145)
TCO2: 32 mmol/L (ref 0–100)
TCO2: 31 mmol/L (ref 0–100)
TCO2: 32 mmol/L (ref 0–100)
TCO2: 30 mmol/L (ref 0–100)
TCO2: 28 mmol/L (ref 0–100)
TCO2: 23 mmol/L (ref 0–100)

## 2015-02-17 LAB — URINALYSIS, ROUTINE W REFLEX MICROSCOPIC
BILIRUBIN URINE: NEGATIVE
Glucose, UA: NEGATIVE mg/dL
Ketones, ur: 15 mg/dL — AB
Nitrite: NEGATIVE
PH: 5 (ref 5.0–8.0)
PROTEIN: 30 mg/dL — AB
Specific Gravity, Urine: 1.017 (ref 1.005–1.030)
Urobilinogen, UA: 0.2 mg/dL (ref 0.0–1.0)

## 2015-02-17 LAB — PREPARE RBC (CROSSMATCH)

## 2015-02-17 LAB — URINE MICROSCOPIC-ADD ON

## 2015-02-17 LAB — BASIC METABOLIC PANEL
Anion gap: 7 (ref 5–15)
BUN: 36 mg/dL — AB (ref 6–20)
CHLORIDE: 100 mmol/L — AB (ref 101–111)
CO2: 32 mmol/L (ref 22–32)
Calcium: 9.6 mg/dL (ref 8.9–10.3)
Creatinine, Ser: 1.79 mg/dL — ABNORMAL HIGH (ref 0.61–1.24)
GFR, EST AFRICAN AMERICAN: 40 mL/min — AB (ref >60)
GFR, EST NON AFRICAN AMERICAN: 34 mL/min — AB (ref >60)
Glucose, Bld: 177 mg/dL — ABNORMAL HIGH (ref 65–99)
Potassium: 3.6 mmol/L (ref 3.5–5.1)
Sodium: 139 mmol/L (ref 135–145)

## 2015-02-17 LAB — PROTIME-INR
INR: 1.36 (ref 0.00–1.49)
Prothrombin Time: 16.9 seconds — ABNORMAL HIGH (ref 11.6–15.2)

## 2015-02-17 LAB — APTT: aPTT: 36 seconds (ref 24–37)

## 2015-02-17 LAB — HEMOGLOBIN AND HEMATOCRIT, BLOOD
HCT: 26.9 % — ABNORMAL LOW (ref 39.0–52.0)
Hemoglobin: 8.8 g/dL — ABNORMAL LOW (ref 13.0–17.0)

## 2015-02-17 LAB — MAGNESIUM: Magnesium: 2 mg/dL (ref 1.7–2.4)

## 2015-02-17 LAB — HEPARIN LEVEL (UNFRACTIONATED): Heparin Unfractionated: 0.62 IU/mL (ref 0.30–0.70)

## 2015-02-17 LAB — PLATELET COUNT: Platelets: 166 10*3/uL (ref 150–400)

## 2015-02-17 SURGERY — CORONARY ARTERY BYPASS GRAFTING (CABG)
Anesthesia: General | Site: Chest

## 2015-02-17 MED ORDER — FENTANYL CITRATE (PF) 250 MCG/5ML IJ SOLN
INTRAMUSCULAR | Status: AC
  Filled 2015-02-17: qty 5

## 2015-02-17 MED ORDER — ALBUMIN HUMAN 5 % IV SOLN
INTRAVENOUS | Status: DC | PRN
Start: 2015-02-17 — End: 2015-02-17
  Administered 2015-02-17: 12:00:00 via INTRAVENOUS

## 2015-02-17 MED ORDER — DOCUSATE SODIUM 100 MG PO CAPS
200.0000 mg | ORAL_CAPSULE | Freq: Every day | ORAL | Status: DC
  Administered 2015-02-19 – 2015-02-20 (×2): 200 mg via ORAL
  Filled 2015-02-17 (×2): qty 2

## 2015-02-17 MED ORDER — ONDANSETRON HCL 4 MG/2ML IJ SOLN
4.0000 mg | Freq: Four times a day (QID) | INTRAMUSCULAR | Status: DC | PRN
  Administered 2015-02-19: 4 mg via INTRAVENOUS
  Filled 2015-02-17: qty 2

## 2015-02-17 MED ORDER — MIDAZOLAM HCL 10 MG/2ML IJ SOLN
INTRAMUSCULAR | Status: AC
  Filled 2015-02-17: qty 2

## 2015-02-17 MED ORDER — SODIUM CHLORIDE 0.9 % IV SOLN
INTRAVENOUS | Status: DC | PRN
  Administered 2015-02-17: 13:00:00 via INTRAVENOUS

## 2015-02-17 MED ORDER — VANCOMYCIN HCL IN DEXTROSE 1-5 GM/200ML-% IV SOLN
1000.0000 mg | Freq: Once | INTRAVENOUS | Status: AC
Start: 2015-02-17 — End: 2015-02-17
  Administered 2015-02-17: 1000 mg via INTRAVENOUS
  Filled 2015-02-17: qty 200

## 2015-02-17 MED ORDER — ONDANSETRON HCL 4 MG/2ML IJ SOLN
INTRAMUSCULAR | Status: AC
  Filled 2015-02-17: qty 2

## 2015-02-17 MED ORDER — TRAMADOL HCL 50 MG PO TABS: 50.0000 mg | ORAL_TABLET | ORAL | Status: DC | PRN

## 2015-02-17 MED ORDER — METOPROLOL TARTRATE 1 MG/ML IV SOLN: 2.5000 mg | INTRAVENOUS | Status: DC | PRN

## 2015-02-17 MED ORDER — SODIUM CHLORIDE 0.9 % IJ SOLN
OROMUCOSAL | Status: DC | PRN
  Administered 2015-02-17 (×3): 4 mL via TOPICAL

## 2015-02-17 MED ORDER — FENTANYL CITRATE (PF) 100 MCG/2ML IJ SOLN
INTRAMUSCULAR | Status: AC
  Filled 2015-02-17: qty 2

## 2015-02-17 MED ORDER — DEXTROSE 5 % IV SOLN
0.0000 ug/min | INTRAVENOUS | Status: DC
  Filled 2015-02-17: qty 2

## 2015-02-17 MED ORDER — METOCLOPRAMIDE HCL 5 MG/ML IJ SOLN
10.0000 mg | Freq: Four times a day (QID) | INTRAMUSCULAR | Status: AC
  Administered 2015-02-17 – 2015-02-18 (×4): 10 mg via INTRAVENOUS
  Filled 2015-02-17 (×4): qty 2

## 2015-02-17 MED ORDER — STERILE WATER FOR INJECTION IJ SOLN
INTRAMUSCULAR | Status: AC
  Filled 2015-02-17: qty 10

## 2015-02-17 MED ORDER — MIDAZOLAM HCL 2 MG/2ML IJ SOLN: 2.0000 mg | INTRAMUSCULAR | Status: DC | PRN

## 2015-02-17 MED ORDER — ROCURONIUM BROMIDE 50 MG/5ML IV SOLN
INTRAVENOUS | Status: AC
Start: 2015-02-17 — End: 2015-02-17
  Filled 2015-02-17: qty 1

## 2015-02-17 MED ORDER — FUROSEMIDE 10 MG/ML IJ SOLN
INTRAMUSCULAR | Status: AC
  Filled 2015-02-17: qty 8

## 2015-02-17 MED ORDER — SODIUM CHLORIDE 0.9 % IJ SOLN: 3.0000 mL | INTRAMUSCULAR | Status: DC | PRN

## 2015-02-17 MED ORDER — LACTATED RINGERS IV SOLN: INTRAVENOUS | Status: DC

## 2015-02-17 MED ORDER — BISACODYL 10 MG RE SUPP: 10.0000 mg | Freq: Every day | RECTAL | Status: DC

## 2015-02-17 MED ORDER — VECURONIUM BROMIDE 10 MG IV SOLR
INTRAVENOUS | Status: AC
  Filled 2015-02-17: qty 10

## 2015-02-17 MED ORDER — GLYCOPYRROLATE 0.2 MG/ML IJ SOLN
INTRAMUSCULAR | Status: AC
  Filled 2015-02-17: qty 1

## 2015-02-17 MED ORDER — SODIUM CHLORIDE 0.9 % IV SOLN: INTRAVENOUS | Status: DC

## 2015-02-17 MED ORDER — DEXTROSE 5 % IV SOLN
1.5000 g | Freq: Two times a day (BID) | INTRAVENOUS | Status: AC
  Administered 2015-02-18 – 2015-02-19 (×4): 1.5 g via INTRAVENOUS
  Filled 2015-02-17 (×4): qty 1.5

## 2015-02-17 MED ORDER — DOPAMINE-DEXTROSE 3.2-5 MG/ML-% IV SOLN: 2.5000 ug/kg/min | INTRAVENOUS | Status: AC

## 2015-02-17 MED ORDER — FUROSEMIDE 10 MG/ML IJ SOLN
INTRAMUSCULAR | Status: DC | PRN
  Administered 2015-02-17: 60 mg via INTRAMUSCULAR

## 2015-02-17 MED ORDER — DOPAMINE-DEXTROSE 3.2-5 MG/ML-% IV SOLN
INTRAVENOUS | Status: DC | PRN
  Administered 2015-02-17: 3 ug/kg/min via INTRAVENOUS

## 2015-02-17 MED ORDER — 0.9 % SODIUM CHLORIDE (POUR BTL) OPTIME
TOPICAL | Status: DC | PRN
  Administered 2015-02-17: 6000 mL
  Administered 2015-02-17: 1000 mL

## 2015-02-17 MED ORDER — LACTATED RINGERS IV SOLN: 500.0000 mL | Freq: Once | INTRAVENOUS | Status: AC | PRN

## 2015-02-17 MED ORDER — FENTANYL CITRATE (PF) 100 MCG/2ML IJ SOLN
INTRAMUSCULAR | Status: DC | PRN
  Administered 2015-02-17: 200 ug via INTRAVENOUS
  Administered 2015-02-17: 50 ug via INTRAVENOUS
  Administered 2015-02-17: 200 ug via INTRAVENOUS
  Administered 2015-02-17: 100 ug via INTRAVENOUS
  Administered 2015-02-17: 200 ug via INTRAVENOUS
  Administered 2015-02-17: 150 ug via INTRAVENOUS
  Administered 2015-02-17: 50 ug via INTRAVENOUS
  Administered 2015-02-17: 100 ug via INTRAVENOUS
  Administered 2015-02-17: 50 ug via INTRAVENOUS
  Administered 2015-02-17: 100 ug via INTRAVENOUS
  Administered 2015-02-17: 200 ug via INTRAVENOUS

## 2015-02-17 MED ORDER — ACETAMINOPHEN 160 MG/5ML PO SOLN: 1000.0000 mg | Freq: Four times a day (QID) | ORAL | Status: DC

## 2015-02-17 MED ORDER — SODIUM CHLORIDE 0.9 % IJ SOLN
INTRAMUSCULAR | Status: AC
  Filled 2015-02-17: qty 10

## 2015-02-17 MED ORDER — PROPOFOL 10 MG/ML IV BOLUS
INTRAVENOUS | Status: AC
  Filled 2015-02-17: qty 20

## 2015-02-17 MED ORDER — DEXMEDETOMIDINE HCL IN NACL 200 MCG/50ML IV SOLN
0.0000 ug/kg/h | INTRAVENOUS | Status: DC
Start: 2015-02-17 — End: 2015-02-20

## 2015-02-17 MED ORDER — EPHEDRINE SULFATE 50 MG/ML IJ SOLN
INTRAMUSCULAR | Status: AC
  Filled 2015-02-17: qty 1

## 2015-02-17 MED ORDER — OXYCODONE HCL 5 MG PO TABS
5.0000 mg | ORAL_TABLET | ORAL | Status: DC | PRN
  Administered 2015-02-17: 5 mg via ORAL
  Administered 2015-02-18 (×2): 10 mg via ORAL
  Filled 2015-02-17: qty 1
  Filled 2015-02-17 (×2): qty 2

## 2015-02-17 MED ORDER — PHENYLEPHRINE HCL 10 MG/ML IJ SOLN
10.0000 mg | INTRAMUSCULAR | Status: DC | PRN
  Administered 2015-02-17: 25 ug/min via INTRAVENOUS

## 2015-02-17 MED ORDER — MILRINONE IN DEXTROSE 20 MG/100ML IV SOLN
0.1250 ug/kg/min | INTRAVENOUS | Status: AC
Start: 2015-02-17 — End: 2015-02-19
  Administered 2015-02-17 – 2015-02-19 (×4): 0.3 ug/kg/min via INTRAVENOUS
  Filled 2015-02-17 (×4): qty 100

## 2015-02-17 MED ORDER — MORPHINE SULFATE 2 MG/ML IJ SOLN: 1.0000 mg | INTRAMUSCULAR | Status: AC | PRN

## 2015-02-17 MED ORDER — VECURONIUM BROMIDE 10 MG IV SOLR
INTRAVENOUS | Status: DC | PRN
  Administered 2015-02-17: 2 mg via INTRAVENOUS
  Administered 2015-02-17 (×3): 4 mg via INTRAVENOUS

## 2015-02-17 MED ORDER — BISACODYL 5 MG PO TBEC
10.0000 mg | DELAYED_RELEASE_TABLET | Freq: Every day | ORAL | Status: DC
  Administered 2015-02-19 – 2015-02-20 (×2): 10 mg via ORAL
  Filled 2015-02-17 (×2): qty 2

## 2015-02-17 MED ORDER — HEPARIN SODIUM (PORCINE) 1000 UNIT/ML IJ SOLN
INTRAMUSCULAR | Status: AC
  Filled 2015-02-17: qty 1

## 2015-02-17 MED ORDER — ASPIRIN 81 MG PO CHEW: 324.0000 mg | CHEWABLE_TABLET | Freq: Every day | ORAL | Status: DC

## 2015-02-17 MED ORDER — PROPOFOL 10 MG/ML IV BOLUS
INTRAVENOUS | Status: DC | PRN
  Administered 2015-02-17: 40 mg via INTRAVENOUS

## 2015-02-17 MED ORDER — MORPHINE SULFATE 2 MG/ML IJ SOLN
2.0000 mg | INTRAMUSCULAR | Status: DC | PRN
  Administered 2015-02-17 – 2015-02-18 (×2): 2 mg via INTRAVENOUS
  Filled 2015-02-17 (×2): qty 1

## 2015-02-17 MED ORDER — MIDAZOLAM HCL 2 MG/2ML IJ SOLN
INTRAMUSCULAR | Status: AC
  Filled 2015-02-17: qty 2

## 2015-02-17 MED ORDER — PANTOPRAZOLE SODIUM 40 MG PO TBEC
40.0000 mg | DELAYED_RELEASE_TABLET | Freq: Every day | ORAL | Status: DC
  Administered 2015-02-19 – 2015-02-20 (×2): 40 mg via ORAL
  Filled 2015-02-17 (×2): qty 1

## 2015-02-17 MED ORDER — NITROGLYCERIN IN D5W 200-5 MCG/ML-% IV SOLN: 0.0000 ug/min | INTRAVENOUS | Status: DC

## 2015-02-17 MED ORDER — ASPIRIN EC 325 MG PO TBEC
325.0000 mg | DELAYED_RELEASE_TABLET | Freq: Every day | ORAL | Status: DC
  Administered 2015-02-18 – 2015-02-20 (×3): 325 mg via ORAL
  Filled 2015-02-17 (×3): qty 1

## 2015-02-17 MED ORDER — LACTATED RINGERS IV SOLN
INTRAVENOUS | Status: DC | PRN
  Administered 2015-02-17: 07:00:00 via INTRAVENOUS

## 2015-02-17 MED ORDER — ACETAMINOPHEN 500 MG PO TABS
1000.0000 mg | ORAL_TABLET | Freq: Four times a day (QID) | ORAL | Status: DC
  Administered 2015-02-17 – 2015-02-19 (×9): 1000 mg via ORAL
  Filled 2015-02-17 (×14): qty 2

## 2015-02-17 MED ORDER — ALBUMIN HUMAN 5 % IV SOLN
250.0000 mL | INTRAVENOUS | Status: AC | PRN
  Administered 2015-02-17 (×2): 250 mL via INTRAVENOUS

## 2015-02-17 MED ORDER — SODIUM CHLORIDE 0.45 % IV SOLN
INTRAVENOUS | Status: DC | PRN
  Administered 2015-02-17: 14:00:00 via INTRAVENOUS

## 2015-02-17 MED ORDER — PROTAMINE SULFATE 10 MG/ML IV SOLN
INTRAVENOUS | Status: AC
  Filled 2015-02-17: qty 50

## 2015-02-17 MED ORDER — SODIUM CHLORIDE 0.9 % IV SOLN: 250.0000 mL | INTRAVENOUS | Status: DC

## 2015-02-17 MED ORDER — ACETAMINOPHEN 160 MG/5ML PO SOLN: 650.0000 mg | Freq: Once | ORAL | Status: AC

## 2015-02-17 MED ORDER — SODIUM CHLORIDE 0.9 % IJ SOLN
INTRAMUSCULAR | Status: AC
Start: 2015-02-17 — End: 2015-02-17
  Filled 2015-02-17: qty 10

## 2015-02-17 MED ORDER — ROCURONIUM BROMIDE 100 MG/10ML IV SOLN
INTRAVENOUS | Status: DC | PRN
  Administered 2015-02-17: 50 mg via INTRAVENOUS

## 2015-02-17 MED ORDER — PROTAMINE SULFATE 10 MG/ML IV SOLN
INTRAVENOUS | Status: DC | PRN
  Administered 2015-02-17: 350 mg via INTRAVENOUS

## 2015-02-17 MED ORDER — METOPROLOL TARTRATE 12.5 MG HALF TABLET
12.5000 mg | ORAL_TABLET | Freq: Two times a day (BID) | ORAL | Status: DC
  Administered 2015-02-17 – 2015-02-19 (×5): 12.5 mg via ORAL
  Filled 2015-02-17 (×7): qty 1

## 2015-02-17 MED ORDER — INSULIN REGULAR BOLUS VIA INFUSION
0.0000 [IU] | Freq: Three times a day (TID) | INTRAVENOUS | Status: DC
  Filled 2015-02-17: qty 10

## 2015-02-17 MED ORDER — FAMOTIDINE IN NACL 20-0.9 MG/50ML-% IV SOLN
20.0000 mg | Freq: Two times a day (BID) | INTRAVENOUS | Status: AC
  Administered 2015-02-17 (×2): 20 mg via INTRAVENOUS
  Filled 2015-02-17: qty 50

## 2015-02-17 MED ORDER — POTASSIUM CHLORIDE 10 MEQ/50ML IV SOLN
10.0000 meq | INTRAVENOUS | Status: AC
  Administered 2015-02-17 (×2): 10 meq via INTRAVENOUS

## 2015-02-17 MED ORDER — ACETAMINOPHEN 650 MG RE SUPP
650.0000 mg | Freq: Once | RECTAL | Status: AC
  Administered 2015-02-17: 650 mg via RECTAL

## 2015-02-17 MED ORDER — PROTAMINE SULFATE 10 MG/ML IV SOLN
INTRAVENOUS | Status: AC
  Filled 2015-02-17: qty 5

## 2015-02-17 MED ORDER — HEPARIN SODIUM (PORCINE) 1000 UNIT/ML IJ SOLN
INTRAMUSCULAR | Status: DC | PRN
  Administered 2015-02-17: 37000 [IU] via INTRAVENOUS

## 2015-02-17 MED ORDER — SUCCINYLCHOLINE CHLORIDE 20 MG/ML IJ SOLN
INTRAMUSCULAR | Status: AC
  Filled 2015-02-17: qty 1

## 2015-02-17 MED ORDER — INSULIN REGULAR HUMAN 100 UNIT/ML IJ SOLN
INTRAMUSCULAR | Status: DC
  Administered 2015-02-17: 1.2 [IU]/h via INTRAVENOUS
  Filled 2015-02-17 (×2): qty 2.5

## 2015-02-17 MED ORDER — PROTAMINE SULFATE 10 MG/ML IV SOLN
INTRAVENOUS | Status: AC
  Filled 2015-02-17: qty 10

## 2015-02-17 MED ORDER — HEMOSTATIC AGENTS (NO CHARGE) OPTIME
TOPICAL | Status: DC | PRN
  Administered 2015-02-17: 1 via TOPICAL

## 2015-02-17 MED ORDER — MIDAZOLAM HCL 5 MG/5ML IJ SOLN
INTRAMUSCULAR | Status: DC | PRN
  Administered 2015-02-17: 1 mg via INTRAVENOUS
  Administered 2015-02-17: 2 mg via INTRAVENOUS
  Administered 2015-02-17: 5 mg via INTRAVENOUS
  Administered 2015-02-17: 1 mg via INTRAVENOUS
  Administered 2015-02-17: 2 mg via INTRAVENOUS

## 2015-02-17 MED ORDER — MILRINONE IN DEXTROSE 20 MG/100ML IV SOLN
0.3750 ug/kg/min | INTRAVENOUS | Status: AC
  Administered 2015-02-17: .375 ug/kg/min via INTRAVENOUS
  Filled 2015-02-17: qty 100

## 2015-02-17 MED ORDER — MAGNESIUM SULFATE 4 GM/100ML IV SOLN: 4.0000 g | Freq: Once | INTRAVENOUS | Status: DC

## 2015-02-17 MED ORDER — METOPROLOL TARTRATE 25 MG/10 ML ORAL SUSPENSION
12.5000 mg | Freq: Two times a day (BID) | ORAL | Status: DC
  Filled 2015-02-17 (×7): qty 5

## 2015-02-17 MED ORDER — SODIUM CHLORIDE 0.9 % IJ SOLN
3.0000 mL | Freq: Two times a day (BID) | INTRAMUSCULAR | Status: DC
  Administered 2015-02-18 – 2015-02-20 (×5): 3 mL via INTRAVENOUS

## 2015-02-17 MED FILL — Sodium Chloride IV Soln 0.9%: INTRAVENOUS | Qty: 2000 | Status: AC

## 2015-02-17 MED FILL — Sodium Bicarbonate IV Soln 8.4%: INTRAVENOUS | Qty: 50 | Status: AC

## 2015-02-17 MED FILL — Electrolyte-R (PH 7.4) Solution: INTRAVENOUS | Qty: 3000 | Status: AC

## 2015-02-17 MED FILL — Mannitol IV Soln 20%: INTRAVENOUS | Qty: 500 | Status: AC

## 2015-02-17 MED FILL — Heparin Sodium (Porcine) Inj 1000 Unit/ML: INTRAMUSCULAR | Qty: 10 | Status: AC

## 2015-02-17 MED FILL — Lidocaine HCl IV Inj 20 MG/ML: INTRAVENOUS | Qty: 5 | Status: AC

## 2015-02-17 SURGICAL SUPPLY — 69 items
BAG DECANTER FOR FLEXI CONT (MISCELLANEOUS) ×3 IMPLANT
BANDAGE ELASTIC 4 VELCRO ST LF (GAUZE/BANDAGES/DRESSINGS) ×3 IMPLANT
BANDAGE ELASTIC 6 VELCRO ST LF (GAUZE/BANDAGES/DRESSINGS) ×3 IMPLANT
BLADE STERNUM SYSTEM 6 (BLADE) ×3 IMPLANT
BLADE SURG 11 STRL SS (BLADE) ×3 IMPLANT
BNDG GAUZE ELAST 4 BULKY (GAUZE/BANDAGES/DRESSINGS) ×3 IMPLANT
CANISTER SUCTION 2500CC (MISCELLANEOUS) ×3 IMPLANT
CATH CPB KIT GERHARDT (MISCELLANEOUS) ×3 IMPLANT
CATH THORACIC 28FR (CATHETERS) ×3 IMPLANT
CRADLE DONUT ADULT HEAD (MISCELLANEOUS) ×3 IMPLANT
DRAIN CHANNEL 28F RND 3/8 FF (WOUND CARE) ×3 IMPLANT
DRAPE CARDIOVASCULAR INCISE (DRAPES) ×1
DRAPE SLUSH/WARMER DISC (DRAPES) ×3 IMPLANT
DRAPE SRG 135X102X78XABS (DRAPES) ×2 IMPLANT
DRSG AQUACEL AG ADV 3.5X14 (GAUZE/BANDAGES/DRESSINGS) ×3 IMPLANT
ELECT BLADE 4.0 EZ CLEAN MEGAD (MISCELLANEOUS) ×3
ELECT REM PT RETURN 9FT ADLT (ELECTROSURGICAL) ×6
ELECTRODE BLDE 4.0 EZ CLN MEGD (MISCELLANEOUS) ×2 IMPLANT
ELECTRODE REM PT RTRN 9FT ADLT (ELECTROSURGICAL) ×4 IMPLANT
GAUZE SPONGE 4X4 12PLY STRL (GAUZE/BANDAGES/DRESSINGS) ×6 IMPLANT
GLOVE BIO SURGEON STRL SZ 6 (GLOVE) ×6 IMPLANT
GLOVE BIO SURGEON STRL SZ 6.5 (GLOVE) ×24 IMPLANT
GLOVE BIOGEL PI IND STRL 6 (GLOVE) ×2 IMPLANT
GLOVE BIOGEL PI IND STRL 6.5 (GLOVE) ×8 IMPLANT
GLOVE BIOGEL PI IND STRL 7.0 (GLOVE) ×4 IMPLANT
GLOVE BIOGEL PI INDICATOR 6 (GLOVE) ×1
GLOVE BIOGEL PI INDICATOR 6.5 (GLOVE) ×4
GLOVE BIOGEL PI INDICATOR 7.0 (GLOVE) ×2
GOWN STRL REUS W/ TWL LRG LVL3 (GOWN DISPOSABLE) ×16 IMPLANT
GOWN STRL REUS W/TWL LRG LVL3 (GOWN DISPOSABLE) ×8
HEMOSTAT POWDER SURGIFOAM 1G (HEMOSTASIS) ×9 IMPLANT
HEMOSTAT SURGICEL 2X14 (HEMOSTASIS) ×3 IMPLANT
KIT BASIN OR (CUSTOM PROCEDURE TRAY) ×3 IMPLANT
KIT CATH SUCT 8FR (CATHETERS) ×3 IMPLANT
KIT ROOM TURNOVER OR (KITS) ×3 IMPLANT
KIT SUCTION CATH 14FR (SUCTIONS) ×9 IMPLANT
KIT VASOVIEW W/TROCAR VH 2000 (KITS) ×3 IMPLANT
LEAD PACING MYOCARDI (MISCELLANEOUS) ×3 IMPLANT
MARKER GRAFT CORONARY BYPASS (MISCELLANEOUS) ×9 IMPLANT
NS IRRIG 1000ML POUR BTL (IV SOLUTION) ×18 IMPLANT
PACK OPEN HEART (CUSTOM PROCEDURE TRAY) ×3 IMPLANT
PAD ARMBOARD 7.5X6 YLW CONV (MISCELLANEOUS) ×6 IMPLANT
PAD ELECT DEFIB RADIOL ZOLL (MISCELLANEOUS) ×3 IMPLANT
PENCIL BUTTON HOLSTER BLD 10FT (ELECTRODE) ×3 IMPLANT
PUNCH AORTIC ROT 4.0MM RCL 40 (MISCELLANEOUS) ×3 IMPLANT
SENSOR MYOCARDIAL TEMP (MISCELLANEOUS) ×3 IMPLANT
SOLUTION ANTI FOG 6CC (MISCELLANEOUS) ×3 IMPLANT
SPONGE LAP 18X18 X RAY DECT (DISPOSABLE) ×6 IMPLANT
SUT BONE WAX W31G (SUTURE) ×3 IMPLANT
SUT MNCRL AB 4-0 PS2 18 (SUTURE) ×3 IMPLANT
SUT PROLENE 3 0 SH1 36 (SUTURE) ×3 IMPLANT
SUT PROLENE 4 0 TF (SUTURE) ×6 IMPLANT
SUT PROLENE 6 0 CC (SUTURE) ×9 IMPLANT
SUT PROLENE 7 0 BV1 MDA (SUTURE) ×6 IMPLANT
SUT PROLENE 8 0 BV175 6 (SUTURE) ×3 IMPLANT
SUT STEEL 6MS V (SUTURE) ×3 IMPLANT
SUT STEEL SZ 6 DBL 3X14 BALL (SUTURE) ×3 IMPLANT
SUT VIC AB 1 CTX 18 (SUTURE) ×6 IMPLANT
SUT VIC AB 2-0 CT1 27 (SUTURE) ×1
SUT VIC AB 2-0 CT1 TAPERPNT 27 (SUTURE) ×2 IMPLANT
SUTURE E-PAK OPEN HEART (SUTURE) ×3 IMPLANT
SYSTEM SAHARA CHEST DRAIN ATS (WOUND CARE) ×3 IMPLANT
TAPE CLOTH SURG 4X10 WHT LF (GAUZE/BANDAGES/DRESSINGS) ×3 IMPLANT
TOWEL OR 17X24 6PK STRL BLUE (TOWEL DISPOSABLE) ×6 IMPLANT
TOWEL OR 17X26 10 PK STRL BLUE (TOWEL DISPOSABLE) ×6 IMPLANT
TRAY FOLEY IC TEMP SENS 16FR (CATHETERS) ×3 IMPLANT
TUBING INSUFFLATION (TUBING) ×3 IMPLANT
UNDERPAD 30X30 INCONTINENT (UNDERPADS AND DIAPERS) ×3 IMPLANT
WATER STERILE IRR 1000ML POUR (IV SOLUTION) ×6 IMPLANT

## 2015-02-17 NOTE — Progress Notes (Signed)
Chaplain responded to staff request to visit pt who is scheduled for CABG in the morning. Pt freely expressed both his faith in God and his confidence in the surgeon. Pt was quite talkative, especially about his family.  We had prayer together. Pt expressed great appreciation for the visit.

## 2015-02-17 NOTE — Procedures (Signed)
Extubation Procedure Note  Patient Details:   Name: Othar Nevills DOB: 1931/10/03 MRN: [id number]   Patient extubated to 4L [place]. NIF -25, VC 1.4L. Patient tolerated well. Patient able to vocalize and clear secretions. No stridor noted. RT will continue to monitor.    Evaluation  O2 sats: stable throughout Complications: No apparent complications Patient did tolerate procedure well. Bilateral Breath Sounds: Clear, Diminished Suctioning: Airway Yes  Kelle Darting 02/17/2015, 5:03 PM

## 2015-02-17 NOTE — Anesthesia Procedure Notes (Addendum)
Procedure Name: Intubation Date/Time: 02/17/2015 7:48 AM Performed by: Rush Farmer E Pre-anesthesia Checklist: Patient identified, Timeout performed, Emergency Drugs available, Suction available and Patient being monitored Patient Re-evaluated:Patient Re-evaluated prior to inductionOxygen Delivery Method: Circle system utilized Preoxygenation: Pre-oxygenation with 100% oxygen Intubation Type: IV induction Ventilation: Mask ventilation without difficulty Laryngoscope Size: Miller and 2 Grade View: Grade II Tube type: Oral Tube size: 8.0 mm Number of attempts: 1 Airway Equipment and Method: Stylet Placement Confirmation: ETT inserted through vocal cords under direct vision,  breath sounds checked- equal and bilateral,  positive ETCO2 and CO2 detector Secured at: 23 cm Tube secured with: Tape Dental Injury: Teeth and Oropharynx as per pre-operative assessment  Comments: AOI per Sherley Bounds, SRNA with MDA supervision.

## 2015-02-17 NOTE — OR Nursing (Signed)
Off-pump call to SICU charge nurse at 1152.

## 2015-02-17 NOTE — Brief Op Note (Signed)
02/06/2015 - 02/17/2015  11:10 AM  PATIENT:  Zachary Mcmahon  81 y.o. male  PRE-OPERATIVE DIAGNOSIS:  CAD  POST-OPERATIVE DIAGNOSIS:  CAD  PROCEDURE:  Procedure(s):  CORONARY ARTERY BYPASS GRAFTING x 5 -LIMA to LAD -SVG to DIAGONAL - SEQ SVG to OM1 and PLB -SVG to PDA  ENDOSCOPIC HARVEST GREATER SAPHENOUS VEIN - Right Leg  TRANSESOPHAGEAL ECHOCARDIOGRAM (TEE) (N/A)  SURGEON:  Surgeon(s) and Role:    * [name], MD - Primary  PHYSICIAN ASSISTANT: Romelia Bromell PA-C  ANESTHESIA:   general  EBL:  Total I/O In: 800 [I.V.:800] Out: 270 [Urine:270]  BLOOD ADMINISTERED: CELLSAVER  DRAINS: Left Pleural Chest Tube, Mediastinal Chest Drains   LOCAL MEDICATIONS USED:  NONE  SPECIMEN:  No Specimen  DISPOSITION OF SPECIMEN:  N/A  COUNTS:  YES  TOURNIQUET:  * No tourniquets in log *  DICTATION: .Dragon Dictation  PLAN OF CARE: Admit to inpatient   PATIENT DISPOSITION:  ICU - intubated and hemodynamically stable.   Delay start of Pharmacological VTE agent (>24hrs) due to surgical blood loss or risk of bleeding: yes

## 2015-02-17 NOTE — OR Nursing (Signed)
20 minute call to SICU charge nurse at 1239.

## 2015-02-17 NOTE — Progress Notes (Signed)
EKG CRITICAL VALUE     12 lead EKG performed.  Critical value noted.  Hurshel Party, RN notified.   Esti Demello C, CCT 02/17/2015 1:51 PM

## 2015-02-17 NOTE — OR Nursing (Signed)
40 minute call to SICU charge nurse at 1210.

## 2015-02-17 NOTE — Progress Notes (Signed)
  Echocardiogram Echocardiogram Transesophageal has been performed.  Zachary Mcmahon 02/17/2015, 8:20 AM

## 2015-02-17 NOTE — Progress Notes (Signed)
Patient ID: Zachary Mcmahon, male   DOB: 02-25-1932, 81 y.o.   MRN: [id number] EVENING ROUNDS NOTE :     [address]       [place] [address]             [phone]                 Day of Surgery Procedure(s) (LRB): CORONARY ARTERY BYPASS GRAFTING (CABG), ON PUMP, TIMES FIVE, USING LEFT INTERNAL MAMMARY ARTERY, RIGHT GREATER SAPHENOUS VEIN HARVESTED ENDOSCOPICALLY (N/A) TRANSESOPHAGEAL ECHOCARDIOGRAM (TEE) (N/A)  Total Length of Stay:  LOS: 11 days  BP 121/49 mmHg  Pulse 117  Temp(Src) 100.2 F (37.9 C) (Core (Comment))  Resp 15  Ht 5\' 9"  (1.753 m)  Wt 202 lb 13.2 oz (92 kg)  BMI 29.94 kg/m2  SpO2 97%  .Intake/Output      06/27 0701 - 06/28 0700 06/28 0701 - 06/29 0700   P.O.     I.V. (mL/kg) 763.8 (8.3) 2048.9 (22.3)   Blood  339   IV Piggyback 50 1150   Total Intake(mL/kg) 813.8 (8.8) 3537.9 (38.5)   Urine (mL/kg/hr) 1575 (0.7) 995 (0.9)   Stool 0 (0)    Blood  700 (0.7)   Chest Tube  128 (0.1)   Total Output 1575 1823   Net -761.3 +1714.9        Urine Occurrence 6 x    Stool Occurrence 1 x      . sodium chloride 20 mL/hr at 02/17/15 1800  . [START ON 02/18/2015] sodium chloride    . sodium chloride 20 mL/hr at 02/17/15 1800  . dexmedetomidine Stopped (02/17/15 1550)  . DOPamine 3.014 mcg/kg/min (02/17/15 1800)  . insulin (NOVOLIN-R) infusion Stopped (02/17/15 1800)  . lactated ringers    . lactated ringers Stopped (02/17/15 1330)  . milrinone 0.3 mcg/kg/min (02/17/15 1800)  . nitroGLYCERIN Stopped (02/17/15 1325)  . phenylephrine (NEO-SYNEPHRINE) Adult infusion Stopped (02/17/15 1530)     Lab Results  Component Value Date   WBC 21.5* 02/17/2015   HGB 9.8* 02/17/2015   HCT 29.6* 02/17/2015   PLT 175 02/17/2015   GLUCOSE 163* 02/17/2015   CHOL 83 02/07/2015   TRIG 91 02/07/2015   HDL 17* 02/07/2015   LDLCALC 48 02/07/2015   ALT 12* 02/10/2015   AST 16 02/10/2015   NA 140 02/17/2015   K 3.3* 02/17/2015   CL 99* 02/17/2015   CREATININE 1.70* 02/17/2015   BUN 31* 02/17/2015   CO2 32 02/17/2015   TSH 1.364 02/07/2015   INR 1.36 02/17/2015   HGBA1C 7.5* 02/07/2015   Stable renal function, baseline renal insufficiency is known Now extubated , neuro intact not bleeding  With decreased lv function continue low dose dopamine and milrinone for now   [name] MD  Beeper [phone] Office [phone] 02/17/2015 6:40 PM

## 2015-02-17 NOTE — Progress Notes (Signed)
Pt son at bedside upon going to OR. Pt's son given pt's wedding ring, cell phone and glasses upon pt request. Pt and son informed all other belongings will be taken to 2S.

## 2015-02-17 NOTE — Transfer of Care (Signed)
Immediate Anesthesia Transfer of Care Note  Patient: Zachary Mcmahon  Procedure(s) Performed: Procedure(s) with comments: CORONARY ARTERY BYPASS GRAFTING (CABG), ON PUMP, TIMES FIVE, USING LEFT INTERNAL MAMMARY ARTERY, RIGHT GREATER SAPHENOUS VEIN HARVESTED ENDOSCOPICALLY (N/A) - -LIMA to LAD -SVG to DIAGONAL - SEQ SVG to OM1 and PLB -SVG to PDA TRANSESOPHAGEAL ECHOCARDIOGRAM (TEE) (N/A)  Patient Location: SICU  Anesthesia Type:General  Level of Consciousness: sedated, unresponsive and Patient remains intubated per anesthesia plan  Airway & Oxygen Therapy: Patient placed on Ventilator (see vital sign flow sheet for setting)  Post-op Assessment: Report given to RN and Post -op Vital signs reviewed and stable  Post vital signs: Reviewed and stable  Last Vitals:  Filed Vitals:   02/17/15 0400  BP: 143/67  Pulse: 84  Temp: 36.4 C  Resp: 12    Complications: No apparent anesthesia complications

## 2015-02-18 ENCOUNTER — Inpatient Hospital Stay (HOSPITAL_COMMUNITY): Payer: Medicare Other

## 2015-02-18 ENCOUNTER — Encounter (HOSPITAL_COMMUNITY): Payer: Self-pay | Admitting: Cardiothoracic Surgery

## 2015-02-18 LAB — GLUCOSE, CAPILLARY
GLUCOSE-CAPILLARY: 111 mg/dL — AB (ref 65–99)
GLUCOSE-CAPILLARY: 116 mg/dL — AB (ref 65–99)
GLUCOSE-CAPILLARY: 129 mg/dL — AB (ref 65–99)
GLUCOSE-CAPILLARY: 134 mg/dL — AB (ref 65–99)
GLUCOSE-CAPILLARY: 144 mg/dL — AB (ref 65–99)
GLUCOSE-CAPILLARY: 150 mg/dL — AB (ref 65–99)
GLUCOSE-CAPILLARY: 155 mg/dL — AB (ref 65–99)
Glucose-Capillary: 115 mg/dL — ABNORMAL HIGH (ref 65–99)
Glucose-Capillary: 129 mg/dL — ABNORMAL HIGH (ref 65–99)
Glucose-Capillary: 138 mg/dL — ABNORMAL HIGH (ref 65–99)
Glucose-Capillary: 138 mg/dL — ABNORMAL HIGH (ref 65–99)
Glucose-Capillary: 146 mg/dL — ABNORMAL HIGH (ref 65–99)
Glucose-Capillary: 148 mg/dL — ABNORMAL HIGH (ref 65–99)
Glucose-Capillary: 152 mg/dL — ABNORMAL HIGH (ref 65–99)
Glucose-Capillary: 157 mg/dL — ABNORMAL HIGH (ref 65–99)

## 2015-02-18 LAB — URINE CULTURE: Culture: NO GROWTH

## 2015-02-18 LAB — POCT I-STAT, CHEM 8
BUN: 33 mg/dL — ABNORMAL HIGH (ref 6–20)
CREATININE: 2 mg/dL — AB (ref 0.61–1.24)
Calcium, Ion: 1.42 mmol/L — ABNORMAL HIGH (ref 1.13–1.30)
Chloride: 101 mmol/L (ref 101–111)
GLUCOSE: 137 mg/dL — AB (ref 65–99)
HCT: 30 % — ABNORMAL LOW (ref 39.0–52.0)
HEMOGLOBIN: 10.2 g/dL — AB (ref 13.0–17.0)
POTASSIUM: 4.2 mmol/L (ref 3.5–5.1)
Sodium: 136 mmol/L (ref 135–145)
TCO2: 22 mmol/L (ref 0–100)

## 2015-02-18 LAB — BASIC METABOLIC PANEL
ANION GAP: 10 (ref 5–15)
BUN: 34 mg/dL — ABNORMAL HIGH (ref 6–20)
CALCIUM: 9.6 mg/dL (ref 8.9–10.3)
CO2: 26 mmol/L (ref 22–32)
Chloride: 102 mmol/L (ref 101–111)
Creatinine, Ser: 2.13 mg/dL — ABNORMAL HIGH (ref 0.61–1.24)
GFR calc non Af Amer: 28 mL/min — ABNORMAL LOW (ref >60)
GFR, EST AFRICAN AMERICAN: 32 mL/min — AB (ref >60)
Glucose, Bld: 138 mg/dL — ABNORMAL HIGH (ref 65–99)
Potassium: 4.2 mmol/L (ref 3.5–5.1)
Sodium: 138 mmol/L (ref 135–145)

## 2015-02-18 LAB — CBC
HCT: 31.7 % — ABNORMAL LOW (ref 39.0–52.0)
HEMATOCRIT: 30.6 % — AB (ref 39.0–52.0)
HEMOGLOBIN: 10.1 g/dL — AB (ref 13.0–17.0)
HEMOGLOBIN: 10 g/dL — AB (ref 13.0–17.0)
MCH: 29.5 pg (ref 26.0–34.0)
MCH: 30.5 pg (ref 26.0–34.0)
MCHC: 31.9 g/dL (ref 30.0–36.0)
MCHC: 32.7 g/dL (ref 30.0–36.0)
MCV: 92.7 fL (ref 78.0–100.0)
MCV: 93.3 fL (ref 78.0–100.0)
Platelets: 161 10*3/uL (ref 150–400)
Platelets: 150 10*3/uL (ref 150–400)
RBC: 3.42 MIL/uL — AB (ref 4.22–5.81)
RBC: 3.28 MIL/uL — ABNORMAL LOW (ref 4.22–5.81)
RDW: 13.9 % (ref 11.5–15.5)
RDW: 13.8 % (ref 11.5–15.5)
WBC: 17.7 10*3/uL — AB (ref 4.0–10.5)
WBC: 20.3 10*3/uL — AB (ref 4.0–10.5)

## 2015-02-18 LAB — MAGNESIUM
MAGNESIUM: 2.2 mg/dL (ref 1.7–2.4)
Magnesium: 2.3 mg/dL (ref 1.7–2.4)

## 2015-02-18 LAB — CREATININE, SERUM
CREATININE: 2.13 mg/dL — AB (ref 0.61–1.24)
GFR, EST AFRICAN AMERICAN: 32 mL/min — AB (ref >60)
GFR, EST NON AFRICAN AMERICAN: 28 mL/min — AB (ref >60)

## 2015-02-18 MED ORDER — INSULIN DETEMIR 100 UNIT/ML ~~LOC~~ SOLN: 10.0000 [IU] | Freq: Every day | SUBCUTANEOUS | Status: DC

## 2015-02-18 MED ORDER — INSULIN ASPART 100 UNIT/ML ~~LOC~~ SOLN
0.0000 [IU] | SUBCUTANEOUS | Status: DC
  Administered 2015-02-18 (×3): 2 [IU] via SUBCUTANEOUS
  Administered 2015-02-19 (×2): 4 [IU] via SUBCUTANEOUS
  Administered 2015-02-19 (×3): 2 [IU] via SUBCUTANEOUS
  Administered 2015-02-19: 4 [IU] via SUBCUTANEOUS

## 2015-02-18 MED ORDER — INSULIN DETEMIR 100 UNIT/ML ~~LOC~~ SOLN
10.0000 [IU] | Freq: Every day | SUBCUTANEOUS | Status: DC
  Administered 2015-02-18 – 2015-02-24 (×7): 10 [IU] via SUBCUTANEOUS
  Filled 2015-02-18 (×7): qty 0.1

## 2015-02-18 NOTE — Op Note (Signed)
[name], Zachary Mcmahon NO.:  [id number]  MEDICAL RECORD NO.:  [id number]  LOCATION:  2S08C                        FACILITY:  [facility]  PHYSICIAN:  Lanelle Bal, MD    DATE OF BIRTH:  1932-02-05  DATE OF PROCEDURE:  02/17/2015 DATE OF DISCHARGE:                              OPERATIVE REPORT   PREOPERATIVE DIAGNOSIS:  Severe coronary artery disease with recent myocardial infarction.  POSTOPERATIVE DIAGNOSIS:  Severe coronary artery disease with recent myocardial infarction.  SURGICAL PROCEDURE:  Coronary artery bypass grafting x5 with left internal mammary to the left anterior descending coronary artery, reverse saphenous vein graft to the diagonal coronary artery, sequential reverse saphenous vein graft to the obtuse marginal and to the posterolateral branch of the right coronary artery, reverse saphenous vein graft to the posterior descending coronary artery with right leg endo-vein harvesting thigh and calf greater saphenous vein, right.  SURGEON:  Lanelle Bal, MD.  FIRST ASSISTANT:  Ellwood Handler, PA.  BRIEF HISTORY:  The patient is an 81 year old male who presented on June 17 with chest pain and shortness of breath.  Enzymes were positive.  The patient was admitted to Cardiology on the 19th.  He underwent cardiac catheterization by Dr. [name] which demonstrated severe three-vessel disease.  In addition, the patient has chronic renal disease with a baseline creatinine of 1.8.  The patient was being considered for a bypass surgery and went into respiratory failure and worsening renal failure, requiring intubation and transferred to the intensive care unit, remained on the ventilator for several days, gradually improved. X-rays cleared on antibiotic therapy and diuresis.  Creatinine rose to 2.5 and gradually improved.  The patient returned to his baseline, and coronary artery bypass grafting was recommended, high risk in nature because of the patient's  underlying medical disease.  The patient agreed and signed informed consent.  DESCRIPTION OF PROCEDURE:  The patient underwent general endotracheal anesthesia without incident.  Skin of chest and legs was prepped with Betadine and draped in usual sterile manner.  TEE probe was placed, confirmed of approximately 40% ejection fraction with anterior and septal hypokinesis, trace mitral regurgitation.  Because of the patient's preoperative mapping and no usable vein in the left leg, after appropriate time-out was performed, Guidant endo-vein harvesting system was used to harvest the right greater saphenous in the thigh and calf and was of adequate quality and caliber.  Median sternotomy was performed.  Left internal mammary artery was dissected down as pedicle graft.  Distal artery was divided and had a good free flow.  Pericardium was opened.  Overall, ventricular function was globally depressed with LV enlargement.  The patient was systemically heparinized.  Ascending aorta was cannulated.  The right atrium was cannulated, and aortic root vent cardioplegia needle was introduced into the ascending aorta.  The patient was placed on cardiopulmonary bypass 2.4 L/min/m2.  Sites of anastomosis were selected and dissected out of the epicardium.  The patient's body temperature was cooled to 32 degrees.  Aortic crossclamp was applied, and 600 mL of cold blood potassium cardioplegia was administered with diastolic arrest of the heart.  Myocardial septal temperature was monitored throughout the cross-clamp.  Attention was turned first  to the posterior descending coronary artery.  This vessel was diffusely diseased in the proximal third of the vessel, was opened. The distal vessel was admitted a 1-mm probe using a running 7-0 Prolene, and distal anastomosis was performed.  Attention was then turned to the heart, it was elevated.  The posterolateral branch which was totally occluded on preop films as  was the right coronary artery was a reasonable size vessel.  We then proceeded to open the obtuse marginal vessel which was 1.5 mm in size.  A diamond-type side-to-side anastomosis was carried out.  Distal extent of the same vein was then carried to the posterolateral branch which was opened and was also approximately 1.3-1.4 mm in size.  Using a running 7-0 Prolene, distal anastomosis was performed.  The very distal circumflex was a very small vessel as it arose from the AV groove and was too small to bypass. Attention was then turned to the diagonal coronary artery which was opened, admitted 1.5 mm probe.  Using a running 7-0 Prolene, distal anastomosis was performed.  The left internal mammary artery which had been dissected down as pedicle graft was used to bypass to the LAD.  The LAD was opened in the midportion.  A 1.5 mm probe was passed distally. Using a running 8-0 Prolene, left internal mammary artery was anastomosed to left anterior descending coronary artery.  With the crossclamp still in place, three punch aortotomies were performed and each of the three vein grafts were anastomosed to the ascending aorta. Air was evacuated from the grafts.  Bulldog was removed from the mammary artery with prompt rise in myocardial septal temperature.  Aortic crossclamp was removed.  Total crossclamp time was of 103 minutes.  The patient initially had brief heart block but spontaneously converted to a sinus rhythm.  Atrial and ventricular pacing wires were applied.  Sites of anastomoses were inspected and were free of bleeding.  The patient was started on low-dose dopamine and milrinone infusion.  He was then ventilated; and when the body temperature rewarmed to 37 degrees, he was weaned from cardiopulmonary bypass without difficulty.  TEE showed overall ejection fraction of 40% without MR.  The patient remained hemodynamically stable.  He was decannulated in usual fashion. Protamine sulfate  was administered with operative field hemostatic.  A left pleural tube and a Blake mediastinal drain were left in place. Pericardium was closed with #6 stainless steel wire.  Fascia was closed with interrupted 0 Vicryl, running 3-0 Vicryl in the subcutaneous tissue, 4-0 subcuticular stitch in skin edges.  Dry dressings were applied.  The patient was then transferred to the Surgical Intensive Care Unit having tolerated the procedure without obvious complication. Total pump time was 133 minutes.  The patient did not require any blood bank blood products during the operative procedure.     Lanelle Bal, MD     EG/MEDQ  D:  02/18/2015  T:  02/18/2015  Job:  [id number]

## 2015-02-18 NOTE — Progress Notes (Signed)
Seen post op CABG. Doing well. Alert and without complaints. NSR with some PACs and PVCs. On low dose dopamine and milrinone. BP stable. Mild increase in creatinine post op with good urine output.   Peter Martinique MD, [facility]  02/18/2015 1:22 PM

## 2015-02-18 NOTE — Progress Notes (Addendum)
TCTS DAILY ICU PROGRESS NOTE                   [address]            [place] [address]          [phone]   1 Day Post-Op Procedure(s) (LRB): CORONARY ARTERY BYPASS GRAFTING (CABG), ON PUMP, TIMES FIVE, USING LEFT INTERNAL MAMMARY ARTERY, RIGHT GREATER SAPHENOUS VEIN HARVESTED ENDOSCOPICALLY (N/A) TRANSESOPHAGEAL ECHOCARDIOGRAM (TEE) (N/A)  Total Length of Stay:  LOS: 12 days   Subjective: Comfortable, no complaints.     Objective: Vital signs in last 24 hours: Temp:  [96.8 F (36 C)-100.6 F (38.1 C)] 97.5 F (36.4 C) (06/29 0757) Pulse Rate:  [76-121] 111 (06/29 0800) Cardiac Rhythm:  [-] Sinus tachycardia (06/29 0800) Resp:  [11-24] 19 (06/29 0800) BP: (109-148)/(49-69) 147/58 mmHg (06/29 0800) SpO2:  [94 %-100 %] 96 % (06/29 0800) Arterial Line BP: (117-192)/(31-61) 164/60 mmHg (06/29 0800) FiO2 (%):  [40 %-50 %] 40 % (06/28 1630) Weight:  [207 lb 8 oz (94.121 kg)] 207 lb 8 oz (94.121 kg) (06/29 0433)  Filed Weights   02/16/15 0313 02/17/15 0500 02/18/15 0433  Weight: 201 lb 8 oz (91.4 kg) 202 lb 13.2 oz (92 kg) 207 lb 8 oz (94.121 kg)    Weight change: 4 lb 10.8 oz (2.121 kg)   Hemodynamic parameters for last 24 hours: PAP: (14-36)/(1-17) 16/13 mmHg CO:  [4.1 L/min-8.1 L/min] 8.1 L/min CI:  [2 L/min/m2-3.9 L/min/m2] 3.9 L/min/m2  Intake/Output from previous day: 06/28 0701 - 06/29 0700 In: 4210.6 [I.V.:2621.6; Blood:339; IV Piggyback:1250] Out: 2868 [Urine:1890; Blood:700; Chest Tube:278]  Intake/Output this shift: Total I/O In: 40.5 [I.V.:40.5] Out: 60 [Urine:60]  CBGs 170-182-202-184-150-146-155-138-148-138-152  Current Meds: Scheduled Meds: . acetaminophen  1,000 mg Oral 4 times per day   Or  . acetaminophen (TYLENOL) oral liquid 160 mg/5 mL  1,000 mg Per Tube 4 times per day  . aspirin EC  325 mg Oral Daily   Or  . aspirin  324 mg Per Tube Daily  . atorvastatin  80 mg Oral q1800  . bisacodyl  10 mg Oral Daily   Or  .  bisacodyl  10 mg Rectal Daily  . cefUROXime (ZINACEF)  IV  1.5 g Intravenous Q12H  . docusate sodium  200 mg Oral Daily  . gabapentin  100 mg Oral TID  . insulin aspart  0-24 Units Subcutaneous 6 times per day  . insulin detemir  10 Units Subcutaneous Daily  . insulin regular  0-10 Units Intravenous TID WC  . magnesium sulfate  4 g Intravenous Once  . metoprolol tartrate  12.5 mg Oral BID   Or  . metoprolol tartrate  12.5 mg Per Tube BID  . [START ON 02/19/2015] pantoprazole  40 mg Oral Daily  . sodium chloride  3 mL Intravenous Q12H   Continuous Infusions: . sodium chloride Stopped (02/17/15 2000)  . sodium chloride    . sodium chloride 20 mL/hr at 02/17/15 1900  . dexmedetomidine Stopped (02/17/15 1550)  . DOPamine 3.014 mcg/kg/min (02/18/15 0800)  . insulin (NOVOLIN-R) infusion 2.2 Units/hr (02/18/15 0854)  . lactated ringers    . lactated ringers Stopped (02/17/15 1330)  . milrinone 0.3 mcg/kg/min (02/18/15 0800)  . nitroGLYCERIN Stopped (02/17/15 1325)  . phenylephrine (NEO-SYNEPHRINE) Adult infusion Stopped (02/17/15 1530)   PRN Meds:.sodium chloride, albumin human, hydroxypropyl methylcellulose / hypromellose, metoprolol, midazolam, morphine injection, ondansetron (ZOFRAN) IV, oxyCODONE, sodium chloride, traMADol  Physical Exam: General appearance: alert, cooperative and no distress Heart: regular rate and rhythm, mildly tachy Lungs: clear to auscultation bilaterally Extremities: no edema, redness or tenderness in the calves or thighs Wound: Dressed and dry   Lab Results: CBC: Recent Labs  02/17/15 1850 02/17/15 1858 02/18/15 0500  WBC 17.0*  --  17.7*  HGB 9.9* 9.5* 10.1*  HCT 30.6* 28.0* 31.7*  PLT 148*  --  161   BMET:  Recent Labs  02/17/15 0353  02/17/15 1858 02/18/15 0500  NA 139  < > 139 138  K 3.6  < > 4.3 4.2  CL 100*  < > 102 102  CO2 32  --   --  26  GLUCOSE 177*  < > 169* 138*  BUN 36*  < > 32* 34*  CREATININE 1.79*  < > 1.90* 2.13*    CALCIUM 9.6  --   --  9.6  < > = values in this interval not displayed.  PT/INR:  Recent Labs  02/17/15 1322  LABPROT 16.9*  INR 1.36   Radiology: Dg Chest Port 1 View  02/18/2015   CLINICAL DATA:  coronary artery bypass grafting.  EXAM: PORTABLE CHEST - 1 VIEW  COMPARISON:  02/17/2015.  FINDINGS: Support apparatus: Interval extubation. Removal of enteric tube. Mediastinal drain remains present. RIGHT IJ central line remains present. The Swan-Ganz catheter has been removed. LEFT subclavian vascular sheath remains present. LEFT thoracostomy tube remains present. Monitoring leads project over the chest.  Cardiomediastinal Silhouette: CABG. Mildly enlarged. Size accentuated by lower volumes on today's exam.  Lungs: Basilar atelectasis, expected consequence of extubation. No pneumothorax.  Effusions:  None visible.  Other:  None.  IMPRESSION: 1. Interval extubation with expected lower lung volumes. 2. Mild basilar atelectasis. 3. Accentuation of the cardiopericardial silhouette which appears mildly enlarged, likely due to the lower lung volumes.   Electronically Signed   By: Dereck Ligas M.D.   On: 02/18/2015 07:53   Dg Chest Port 1 View  02/17/2015   CLINICAL DATA:  Status post coronary artery bypass graft x5.  EXAM: PORTABLE CHEST - 1 VIEW  COMPARISON:  February 16, 2015.  FINDINGS: Status post coronary artery bypass graft. Endotracheal tube is in grossly good position with distal tip 5.5 cm above the carina. Nasogastric tube is seen entering the stomach. Left subclavian Swan-Ganz catheter is noted with distal tip in the right pulmonary artery. Right internal jugular catheter is noted with distal tip in expected position of the SVC. Left-sided chest tube is noted without pneumothorax. Mild central pulmonary vascular congestion is noted.  IMPRESSION: No pneumothorax seen status post coronary artery bypass graft. Endotracheal and nasogastric tubes in grossly good position.   Electronically Signed   By:  Marijo Conception, M.D.   On: 02/17/2015 14:01     Assessment/Plan: S/P Procedure(s) (LRB): CORONARY ARTERY BYPASS GRAFTING (CABG), ON PUMP, TIMES FIVE, USING LEFT INTERNAL MAMMARY ARTERY, RIGHT GREATER SAPHENOUS VEIN HARVESTED ENDOSCOPICALLY (N/A) TRANSESOPHAGEAL ECHOCARDIOGRAM (TEE) (N/A)  CV- SR, mildly tachy low 100s.  BPs trending up. Start beta blocker, hold on ACE-I for now due to increased Cr. Wean Milrinone as able.  Renal- Cr up this am, continue Dopamine.  Follow labs in am.  DM- transition to Levemir, SSI this am.  Expected postop blood loss anemia- H/H fairly stable.  D/c MT, mobilize, advance diet. Routine POD #1 progression.  COLLINS,GINA H 02/18/2015 9:04 AM  I have seen and examined Zachary Mcmahon and agree with the above assessment  and plan.  [name] MD Beeper [phone] Office [phone] 02/18/2015 4:10 PM

## 2015-02-18 NOTE — Progress Notes (Signed)
TCTS BRIEF SICU PROGRESS NOTE  1 Day Post-Op  S/P Procedure(s) (LRB): CORONARY ARTERY BYPASS GRAFTING (CABG), ON PUMP, TIMES FIVE, USING LEFT INTERNAL MAMMARY ARTERY, RIGHT GREATER SAPHENOUS VEIN HARVESTED ENDOSCOPICALLY (N/A) TRANSESOPHAGEAL ECHOCARDIOGRAM (TEE) (N/A)   Stable day Maintaining NSR - sinus tach w/ stable BP O2 sats 97% on 1 L/min UOP adequate  Plan: Continue current plan  Rexene Alberts 02/18/2015 9:34 PM

## 2015-02-18 NOTE — Care Management (Signed)
Important Message  Patient Details  Name: Zachary Mcmahon MRN: [id number] Date of Birth: [birth year]   Medicare Important Message Given:  Other (see comment) (IM not given patient in ICU)    Jenaro Souder, [place] 02/18/2015, 2:43 PM

## 2015-02-19 ENCOUNTER — Inpatient Hospital Stay (HOSPITAL_COMMUNITY): Payer: Medicare Other

## 2015-02-19 LAB — GLUCOSE, CAPILLARY
GLUCOSE-CAPILLARY: 121 mg/dL — AB (ref 65–99)
GLUCOSE-CAPILLARY: 162 mg/dL — AB (ref 65–99)
GLUCOSE-CAPILLARY: 161 mg/dL — AB (ref 65–99)
GLUCOSE-CAPILLARY: 170 mg/dL — AB (ref 65–99)
Glucose-Capillary: 134 mg/dL — ABNORMAL HIGH (ref 65–99)
Glucose-Capillary: 126 mg/dL — ABNORMAL HIGH (ref 65–99)

## 2015-02-19 LAB — BASIC METABOLIC PANEL
Anion gap: 7 (ref 5–15)
BUN: 33 mg/dL — ABNORMAL HIGH (ref 6–20)
CO2: 27 mmol/L (ref 22–32)
Calcium: 9.6 mg/dL (ref 8.9–10.3)
Chloride: 101 mmol/L (ref 101–111)
Creatinine, Ser: 2.1 mg/dL — ABNORMAL HIGH (ref 0.61–1.24)
GFR calc Af Amer: 33 mL/min — ABNORMAL LOW (ref >60)
GFR calc non Af Amer: 28 mL/min — ABNORMAL LOW (ref >60)
Glucose, Bld: 138 mg/dL — ABNORMAL HIGH (ref 65–99)
Potassium: 4.1 mmol/L (ref 3.5–5.1)
Sodium: 135 mmol/L (ref 135–145)

## 2015-02-19 LAB — CBC
HCT: 29 % — ABNORMAL LOW (ref 39.0–52.0)
Hemoglobin: 9.3 g/dL — ABNORMAL LOW (ref 13.0–17.0)
MCH: 29.4 pg (ref 26.0–34.0)
MCHC: 32.1 g/dL (ref 30.0–36.0)
MCV: 91.8 fL (ref 78.0–100.0)
Platelets: 157 10*3/uL (ref 150–400)
RBC: 3.16 MIL/uL — ABNORMAL LOW (ref 4.22–5.81)
RDW: 13.7 % (ref 11.5–15.5)
WBC: 18.6 10*3/uL — ABNORMAL HIGH (ref 4.0–10.5)

## 2015-02-19 MED ORDER — OXYCODONE HCL 5 MG PO TABS
5.0000 mg | ORAL_TABLET | ORAL | Status: DC | PRN
  Administered 2015-02-19: 5 mg via ORAL
  Filled 2015-02-19: qty 1

## 2015-02-19 MED ORDER — ENOXAPARIN SODIUM 30 MG/0.3ML ~~LOC~~ SOLN
30.0000 mg | SUBCUTANEOUS | Status: DC
  Administered 2015-02-19 – 2015-02-24 (×6): 30 mg via SUBCUTANEOUS
  Filled 2015-02-19 (×6): qty 0.3

## 2015-02-19 MED ORDER — TRAMADOL HCL 50 MG PO TABS
50.0000 mg | ORAL_TABLET | ORAL | Status: DC | PRN
  Administered 2015-02-21 – 2015-02-23 (×5): 50 mg via ORAL
  Filled 2015-02-19 (×5): qty 1

## 2015-02-19 MED ORDER — INSULIN ASPART 100 UNIT/ML ~~LOC~~ SOLN
0.0000 [IU] | Freq: Three times a day (TID) | SUBCUTANEOUS | Status: DC
  Administered 2015-02-20: 2 [IU] via SUBCUTANEOUS
  Administered 2015-02-20 (×2): 4 [IU] via SUBCUTANEOUS
  Administered 2015-02-20 – 2015-02-21 (×3): 2 [IU] via SUBCUTANEOUS
  Administered 2015-02-21: 8 [IU] via SUBCUTANEOUS
  Administered 2015-02-21 – 2015-02-22 (×2): 4 [IU] via SUBCUTANEOUS
  Administered 2015-02-22: 8 [IU] via SUBCUTANEOUS
  Administered 2015-02-22 – 2015-02-23 (×3): 2 [IU] via SUBCUTANEOUS
  Administered 2015-02-23: 4 [IU] via SUBCUTANEOUS
  Administered 2015-02-23 – 2015-02-24 (×3): 2 [IU] via SUBCUTANEOUS

## 2015-02-19 MED ORDER — FUROSEMIDE 10 MG/ML IJ SOLN
40.0000 mg | Freq: Once | INTRAMUSCULAR | Status: AC
  Administered 2015-02-19: 40 mg via INTRAVENOUS
  Filled 2015-02-19: qty 4

## 2015-02-19 NOTE — Progress Notes (Signed)
Patient ID: Zachary Mcmahon, male   DOB: March 08, 1932, 81 y.o.   MRN: [id number] EVENING ROUNDS NOTE :     [address]       [place], [address]             [phone]                 2 Days Post-Op Procedure(s) (LRB): CORONARY ARTERY BYPASS GRAFTING (CABG), ON PUMP, TIMES FIVE, USING LEFT INTERNAL MAMMARY ARTERY, RIGHT GREATER SAPHENOUS VEIN HARVESTED ENDOSCOPICALLY (N/A) TRANSESOPHAGEAL ECHOCARDIOGRAM (TEE) (N/A)  Total Length of Stay:  LOS: 13 days  BP 132/63 mmHg  Pulse 102  Temp(Src) 98.4 F (36.9 C) (Oral)  Resp 20  Ht 5\' 9"  (1.753 m)  Wt 208 lb 8.9 oz (94.6 kg)  BMI 30.78 kg/m2  SpO2 99%  .Intake/Output      06/29 0701 - 06/30 0700 06/30 0701 - 07/01 0700   P.O. 720 540   I.V. (mL/kg) 784.6 (8.3) 290.9 (3.1)   Blood     IV Piggyback 100    Total Intake(mL/kg) 1604.6 (17) 830.9 (8.8)   Urine (mL/kg/hr) 1070 (0.5) 935 (0.8)   Blood     Chest Tube 440 (0.2) 50 (0)   Total Output 1510 985   Net +94.6 -154.1          . sodium chloride Stopped (02/17/15 2000)  . sodium chloride    . sodium chloride 20 mL/hr at 02/17/15 1900  . dexmedetomidine Stopped (02/17/15 1550)  . lactated ringers    . lactated ringers Stopped (02/17/15 1330)  . nitroGLYCERIN Stopped (02/17/15 1325)  . phenylephrine (NEO-SYNEPHRINE) Adult infusion Stopped (02/17/15 1530)     Lab Results  Component Value Date   WBC 18.6* 02/19/2015   HGB 9.3* 02/19/2015   HCT 29.0* 02/19/2015   PLT 157 02/19/2015   GLUCOSE 138* 02/19/2015   CHOL 83 02/07/2015   TRIG 91 02/07/2015   HDL 17* 02/07/2015   LDLCALC 48 02/07/2015   ALT 12* 02/10/2015   AST 16 02/10/2015   NA 135 02/19/2015   K 4.1 02/19/2015   CL 101 02/19/2015   CREATININE 2.10* 02/19/2015   BUN 33* 02/19/2015   CO2 27 02/19/2015   TSH 1.364 02/07/2015   INR 1.36 02/17/2015   HGBA1C 7.5* 02/07/2015   Stable day , pt worked on mobility today  [name] MD  Beeper [phone] Office [phone] 02/19/2015 6:44  PM

## 2015-02-19 NOTE — Evaluation (Signed)
Physical Therapy Re-Evaluation Patient Details Name: Zachary Mcmahon MRN: [id number] DOB: [birth year] Today's Date: 02/19/2015   History of Present Illness  81 yo male h/o dm, htn, abnormal myoview testing in 2013, no previous heart cath comes in with chest pain across his lower chest along with generalized abdominal pain. Pt with NSTEMI, and pulmonary edema, VDRF 6/21-6/23. s/p CABG x5 on 6/28.  Clinical Impression  Patient is re-evaluated by physical therapy following the above procedure, presenting with functional limitations due to the deficits listed below (see PT Problem List). Required Mod assist for bed mobility and transfer today, Min guard to ambulate up to 20 feet. Vitals stable throughout although easily fatigued and reporting intermittent dizziness. Goals and recommendations have been updated appropriately. Patient will benefit from skilled PT to increase their independence and safety with mobility to allow discharge to the venue listed below.       Follow Up Recommendations SNF;Supervision/Assistance - 24 hour (Wants to stay at facility near his home in [place])    Equipment Recommendations   (TBD at next venue of care.)    Recommendations for Other Services       Precautions / Restrictions Precautions Precautions: Fall;Sternal Restrictions Weight Bearing Restrictions: Yes (stenral precautions) Other Position/Activity Restrictions: Sternal precautions      Mobility  Bed Mobility Overal bed mobility: Needs Assistance Bed Mobility: Rolling;Sidelying to Sit Rolling: Mod assist Sidelying to sit: Mod assist       General bed mobility comments: Mod assist for trunk support to roll onto side, VC for LEs to come off of bed with log roll technique and trunk support to assume seated position on edge of bed. Pad used to scoot pt forward as he could not do this himself.  Transfers Overall transfer level: Needs assistance Equipment used: Rolling walker (2 wheeled) Transfers: Sit  to/from Stand Sit to Stand: Mod assist         General transfer comment: Cues for hand placement lightly on knees or holding pillow to maintain sternal precautions. Mod assist for boost to stand from lowest bed setting. Performed x2. Reported lightheadedness first attempt.  Ambulation/Gait Ambulation/Gait assistance: Min guard Ambulation Distance (Feet): 20 Feet Assistive device: Rolling walker (2 wheeled) Gait Pattern/deviations: Step-through pattern;Decreased stride length;Shuffle;Trunk flexed Gait velocity: slow Gait velocity interpretation: <1.8 ft/sec, indicative of risk for recurrent falls General Gait Details: Close guard for safety. Frequent VC for upright posture with forward gaze, and for walker control. Small shuffled steps but no overt loss of balance while steadying self with RW. Cues for light touch through UEs to maintain precautions.  Stairs            Wheelchair Mobility    Modified Rankin (Stroke Patients Only)       Balance Overall balance assessment: Needs assistance Sitting-balance support: No upper extremity supported;Feet supported Sitting balance-Leahy Scale: Fair Sitting balance - Comments: leaning heavily to left initially    Standing balance support: No upper extremity supported Standing balance-Leahy Scale: Fair                               Pertinent Vitals/Pain Pain Assessment: No/denies pain (when I cough only)    Home Living Family/patient expects to be discharged to:: Private residence Living Arrangements: Spouse/significant other Available Help at Discharge: Personal care attendant Type of Home: House Home Access: Ramped entrance     Home Layout: One level Home Equipment: Environmental consultant - 2 wheels;Cane - single point;Electric scooter;Tub  bench Additional Comments: pt with neuropathy and reports gait distance limited by foot pain at baseline. Care attendend present 7A-5P 5 days/week    Prior Function Level of Independence:  Independent with assistive device(s)         Comments: Wife has Alzheimers with caretaker at home who can assist pt as needed. Used cane and walker to ambulate.     Hand Dominance   Dominant Hand: Right    Extremity/Trunk Assessment   Upper Extremity Assessment: Defer to OT evaluation           Lower Extremity Assessment: Generalized weakness         Communication   Communication: No difficulties  Cognition Arousal/Alertness: Awake/alert Behavior During Therapy: WFL for tasks assessed/performed Overall Cognitive Status: Within Functional Limits for tasks assessed                      General Comments General comments (skin integrity, edema, etc.): At rest: HR 105, SpO2 100% on 1L supplemental O2, BP 134/67. Sitting BP 140/63. after ambulating BP 133/61. HR up to 116 while ambulating. SpO2 99% on room air.    Exercises General Exercises - Lower Extremity Ankle Circles/Pumps: AROM;Both;20 reps;Seated Quad Sets: AROM;Both;15 reps;Seated      Assessment/Plan    PT Assessment Patient needs continued PT services  PT Diagnosis Difficulty walking;Generalized weakness;Abnormality of gait   PT Problem List Decreased activity tolerance;Decreased balance;Decreased knowledge of use of DME;Cardiopulmonary status limiting activity;Decreased mobility;Decreased strength;Decreased knowledge of precautions  PT Treatment Interventions Gait training;Functional mobility training;Therapeutic activities;Therapeutic exercise;DME instruction;Patient/family education;Balance training;Neuromuscular re-education;Modalities   PT Goals (Current goals can be found in the Care Plan section) Acute Rehab PT Goals Patient Stated Goal: go fishing PT Goal Formulation: With patient Time For Goal Achievement: 03/05/15 Potential to Achieve Goals: Fair    Frequency Min 3X/week   Barriers to discharge Decreased caregiver support cares for wife    Co-evaluation               End  of Session Equipment Utilized During Treatment: Oxygen Activity Tolerance: Patient limited by fatigue Patient left: in chair;with call bell/phone within reach Nurse Communication: Mobility status         Time: [id number] PT Time Calculation (min) (ACUTE ONLY): 43 min   Charges:   PT Evaluation $Initial PT Evaluation Tier I: 1 Procedure PT Treatments $Gait Training: 8-22 mins $Therapeutic Activity: 8-22 mins   PT G CodesEllouise Newer 02/19/2015, 6:31 PM Camille [place], [place]

## 2015-02-19 NOTE — Anesthesia Postprocedure Evaluation (Signed)
Anesthesia Post Note  Patient: Zachary Mcmahon  Procedure(s) Performed: Procedure(s) (LRB): CORONARY ARTERY BYPASS GRAFTING (CABG), ON PUMP, TIMES FIVE, USING LEFT INTERNAL MAMMARY ARTERY, RIGHT GREATER SAPHENOUS VEIN HARVESTED ENDOSCOPICALLY (N/A) TRANSESOPHAGEAL ECHOCARDIOGRAM (TEE) (N/A)  Anesthesia type: General  Patient location: SICU  Post pain: Pain level controlled  Post assessment: Post-op Vital signs reviewed  Last Vitals: BP 134/58 mmHg  Pulse 113  Temp(Src) 36.7 C (Oral)  Resp 17  Ht 5\' 9"  (1.753 m)  Wt 208 lb 8.9 oz (94.6 kg)  BMI 30.78 kg/m2  SpO2 98%  Post vital signs: Reviewed  Level of consciousness: sedated/intubated  Complications: No apparent anesthesia complications

## 2015-02-19 NOTE — Progress Notes (Signed)
Patient ID: Zachary Mcmahon, male   DOB: 1931/10/06, 81 y.o.   MRN: [id number] TCTS DAILY ICU PROGRESS NOTE                   [address]            [place] [address]          [phone]   2 Days Post-Op Procedure(s) (LRB): CORONARY ARTERY BYPASS GRAFTING (CABG), ON PUMP, TIMES FIVE, USING LEFT INTERNAL MAMMARY ARTERY, RIGHT GREATER SAPHENOUS VEIN HARVESTED ENDOSCOPICALLY (N/A) TRANSESOPHAGEAL ECHOCARDIOGRAM (TEE) (N/A)  Total Length of Stay:  LOS: 13 days   Subjective: Up to chair, alert no confusion   Objective: Vital signs in last 24 hours: Temp:  [98 F (36.7 C)-98.8 F (37.1 C)] 98 F (36.7 C) (06/30 0751) Pulse Rate:  [98-113] 111 (06/30 0700) Cardiac Rhythm:  [-] Sinus tachycardia (06/30 0600) Resp:  [12-21] 15 (06/30 0700) BP: (111-156)/(49-71) 148/61 mmHg (06/30 0700) SpO2:  [91 %-99 %] 96 % (06/30 0700) Arterial Line BP: (150-249)/(60-69) 216/69 mmHg (06/29 1100) Weight:  [208 lb 8.9 oz (94.6 kg)] 208 lb 8.9 oz (94.6 kg) (06/30 0500)  Filed Weights   02/17/15 0500 02/18/15 0433 02/19/15 0500  Weight: 202 lb 13.2 oz (92 kg) 207 lb 8 oz (94.121 kg) 208 lb 8.9 oz (94.6 kg)    Weight change: 1 lb 0.9 oz (0.479 kg)   Hemodynamic parameters for last 24 hours:    Intake/Output from previous day: 06/29 0701 - 06/30 0700 In: 1604.6 [P.O.:720; I.V.:784.6; IV Piggyback:100] Out: 1510 [Urine:1070; Chest Tube:440]  Intake/Output this shift:    Current Meds: Scheduled Meds: . acetaminophen  1,000 mg Oral 4 times per day   Or  . acetaminophen (TYLENOL) oral liquid 160 mg/5 mL  1,000 mg Per Tube 4 times per day  . aspirin EC  325 mg Oral Daily   Or  . aspirin  324 mg Per Tube Daily  . atorvastatin  80 mg Oral q1800  . bisacodyl  10 mg Oral Daily   Or  . bisacodyl  10 mg Rectal Daily  . cefUROXime (ZINACEF)  IV  1.5 g Intravenous Q12H  . docusate sodium  200 mg Oral Daily  . gabapentin  100 mg Oral TID  . insulin aspart  0-24 Units  Subcutaneous 6 times per day  . insulin detemir  10 Units Subcutaneous Daily  . magnesium sulfate  4 g Intravenous Once  . metoprolol tartrate  12.5 mg Oral BID   Or  . metoprolol tartrate  12.5 mg Per Tube BID  . pantoprazole  40 mg Oral Daily  . sodium chloride  3 mL Intravenous Q12H   Continuous Infusions: . sodium chloride Stopped (02/17/15 2000)  . sodium chloride    . sodium chloride 20 mL/hr at 02/17/15 1900  . dexmedetomidine Stopped (02/17/15 1550)  . DOPamine 2 mcg/kg/min (02/18/15 0900)  . lactated ringers    . lactated ringers Stopped (02/17/15 1330)  . milrinone 0.3 mcg/kg/min (02/19/15 0745)  . nitroGLYCERIN Stopped (02/17/15 1325)  . phenylephrine (NEO-SYNEPHRINE) Adult infusion Stopped (02/17/15 1530)   PRN Meds:.sodium chloride, hydroxypropyl methylcellulose / hypromellose, metoprolol, midazolam, morphine injection, ondansetron (ZOFRAN) IV, oxyCODONE, sodium chloride, traMADol  General appearance: alert, cooperative and no distress Neurologic: intact Heart: regular rate and rhythm, S1, S2 normal, no murmur, click, rub or gallop Lungs: diminished breath sounds bibasilar Abdomen: soft, non-tender; bowel sounds normal; no masses,  no organomegaly Extremities: extremities normal, atraumatic, no cyanosis  or edema and Homans sign is negative, no sign of DVT Wound: sternum stable  Lab Results: CBC: Recent Labs  02/18/15 1610 02/19/15 0350  WBC 20.3* 18.6*  HGB 10.0*  10.2* 9.3*  HCT 30.6*  30.0* 29.0*  PLT 150 157   BMET:  Recent Labs  02/18/15 0500 02/18/15 1610 02/19/15 0350  NA 138 136 135  K 4.2 4.2 4.1  CL 102 101 101  CO2 26  --  27  GLUCOSE 138* 137* 138*  BUN 34* 33* 33*  CREATININE 2.13* 2.13*  2.00* 2.10*  CALCIUM 9.6  --  9.6    PT/INR:  Recent Labs  02/17/15 1322  LABPROT 16.9*  INR 1.36   Radiology: Dg Chest Port 1 View  02/19/2015   CLINICAL DATA:  Status post CABG 2 days ago  EXAM: PORTABLE CHEST - 1 VIEW  COMPARISON:   Portable chest x-ray of February 18, 2015  FINDINGS: The lungs are well-expanded. The interstitial markings have improved. The left-sided chest tube is unchanged in position. There is no pneumothorax or significant pleural effusion. The cardiac silhouette remains enlarged. The pulmonary vascularity is normal. There are 7 intact sternal wires. The right internal jugular venous catheter tip projects over the midportion of the SVC.  IMPRESSION: Improved appearance of the pulmonary interstitium and decreased left pleural effusion. The support tubes are stable.   Electronically Signed   By: Zachary  Mcmahon M.D.   On: 02/19/2015 07:52   Chronic Kidney Disease   Stage I     GFR >90  Stage II    GFR 60-89  Stage IIIA GFR 45-59  Stage IIIB GFR 30-44  Stage IV   GFR 15-29  Stage V    GFR  <15  Lab Results  Component Value Date   CREATININE 2.10* 02/19/2015   Estimated Creatinine Clearance: 31.9 mL/min (by C-G formula based on Cr of 2.1). Stage IIIb  Assessment/Plan: S/P Procedure(s) (LRB): CORONARY ARTERY BYPASS GRAFTING (CABG), ON PUMP, TIMES FIVE, USING LEFT INTERNAL MAMMARY ARTERY, RIGHT GREATER SAPHENOUS VEIN HARVESTED ENDOSCOPICALLY (N/A) TRANSESOPHAGEAL ECHOCARDIOGRAM (TEE) (N/A) Mobilize Diuresis d/c tubes/lines Continue foley due to strict I&O, patient in ICU and urinary output monitoring Needs pt  Wean off drips   [name] 02/19/2015 7:58 AM

## 2015-02-20 ENCOUNTER — Inpatient Hospital Stay (HOSPITAL_COMMUNITY): Payer: Medicare Other

## 2015-02-20 LAB — TYPE AND SCREEN
ABO/RH(D): A NEG
ANTIBODY SCREEN: NEGATIVE
UNIT DIVISION: 0
Unit division: 0

## 2015-02-20 LAB — BASIC METABOLIC PANEL
Anion gap: 6 (ref 5–15)
BUN: 43 mg/dL — ABNORMAL HIGH (ref 6–20)
CO2: 27 mmol/L (ref 22–32)
Calcium: 9.3 mg/dL (ref 8.9–10.3)
Chloride: 102 mmol/L (ref 101–111)
Creatinine, Ser: 2.08 mg/dL — ABNORMAL HIGH (ref 0.61–1.24)
GFR calc Af Amer: 33 mL/min — ABNORMAL LOW (ref >60)
GFR calc non Af Amer: 28 mL/min — ABNORMAL LOW (ref >60)
Glucose, Bld: 149 mg/dL — ABNORMAL HIGH (ref 65–99)
Potassium: 4.2 mmol/L (ref 3.5–5.1)
Sodium: 135 mmol/L (ref 135–145)

## 2015-02-20 LAB — GLUCOSE, CAPILLARY
GLUCOSE-CAPILLARY: 166 mg/dL — AB (ref 65–99)
GLUCOSE-CAPILLARY: 195 mg/dL — AB (ref 65–99)
GLUCOSE-CAPILLARY: 155 mg/dL — AB (ref 65–99)
Glucose-Capillary: 157 mg/dL — ABNORMAL HIGH (ref 65–99)

## 2015-02-20 LAB — CBC
HCT: 28.9 % — ABNORMAL LOW (ref 39.0–52.0)
Hemoglobin: 9.1 g/dL — ABNORMAL LOW (ref 13.0–17.0)
MCH: 29.3 pg (ref 26.0–34.0)
MCHC: 31.5 g/dL (ref 30.0–36.0)
MCV: 92.9 fL (ref 78.0–100.0)
Platelets: 186 10*3/uL (ref 150–400)
RBC: 3.11 MIL/uL — ABNORMAL LOW (ref 4.22–5.81)
RDW: 14 % (ref 11.5–15.5)
WBC: 15.3 10*3/uL — ABNORMAL HIGH (ref 4.0–10.5)

## 2015-02-20 MED ORDER — ONDANSETRON HCL 4 MG PO TABS
4.0000 mg | ORAL_TABLET | Freq: Four times a day (QID) | ORAL | Status: DC | PRN
  Administered 2015-02-21: 4 mg via ORAL
  Filled 2015-02-20: qty 1

## 2015-02-20 MED ORDER — SODIUM CHLORIDE 0.9 % IJ SOLN
3.0000 mL | Freq: Two times a day (BID) | INTRAMUSCULAR | Status: DC
  Administered 2015-02-20 – 2015-02-24 (×7): 3 mL via INTRAVENOUS

## 2015-02-20 MED ORDER — PANTOPRAZOLE SODIUM 40 MG PO TBEC
40.0000 mg | DELAYED_RELEASE_TABLET | Freq: Every day | ORAL | Status: DC
  Administered 2015-02-21 – 2015-02-24 (×4): 40 mg via ORAL
  Filled 2015-02-20 (×4): qty 1

## 2015-02-20 MED ORDER — ONDANSETRON HCL 4 MG/2ML IJ SOLN: 4.0000 mg | Freq: Four times a day (QID) | INTRAMUSCULAR | Status: DC | PRN

## 2015-02-20 MED ORDER — METOPROLOL TARTRATE 25 MG PO TABS
25.0000 mg | ORAL_TABLET | Freq: Two times a day (BID) | ORAL | Status: DC
  Administered 2015-02-20 – 2015-02-24 (×9): 25 mg via ORAL
  Filled 2015-02-20 (×10): qty 1

## 2015-02-20 MED ORDER — BISACODYL 10 MG RE SUPP: 10.0000 mg | Freq: Every day | RECTAL | Status: DC | PRN

## 2015-02-20 MED ORDER — DOCUSATE SODIUM 100 MG PO CAPS
200.0000 mg | ORAL_CAPSULE | Freq: Every day | ORAL | Status: DC
  Filled 2015-02-20 (×3): qty 2

## 2015-02-20 MED ORDER — FUROSEMIDE 40 MG PO TABS
40.0000 mg | ORAL_TABLET | Freq: Every day | ORAL | Status: AC
  Administered 2015-02-20 – 2015-02-22 (×3): 40 mg via ORAL
  Filled 2015-02-20 (×3): qty 1

## 2015-02-20 MED ORDER — ALUM & MAG HYDROXIDE-SIMETH 200-200-20 MG/5ML PO SUSP
30.0000 mL | ORAL | Status: DC | PRN
  Administered 2015-02-20 – 2015-02-22 (×6): 30 mL via ORAL
  Filled 2015-02-20 (×7): qty 30

## 2015-02-20 MED ORDER — BISACODYL 5 MG PO TBEC: 10.0000 mg | DELAYED_RELEASE_TABLET | Freq: Every day | ORAL | Status: DC | PRN

## 2015-02-20 MED ORDER — METOPROLOL TARTRATE 25 MG/10 ML ORAL SUSPENSION
12.5000 mg | Freq: Two times a day (BID) | ORAL | Status: DC
  Filled 2015-02-20 (×10): qty 5

## 2015-02-20 MED ORDER — CLONIDINE HCL 0.2 MG PO TABS
0.2000 mg | ORAL_TABLET | Freq: Two times a day (BID) | ORAL | Status: DC
  Administered 2015-02-20 – 2015-02-24 (×9): 0.2 mg via ORAL
  Filled 2015-02-20 (×10): qty 1

## 2015-02-20 MED ORDER — MOVING RIGHT ALONG BOOK
Freq: Once | Status: AC
  Administered 2015-02-20: 10:00:00
  Filled 2015-02-20: qty 1

## 2015-02-20 MED ORDER — SODIUM CHLORIDE 0.9 % IV SOLN: 250.0000 mL | INTRAVENOUS | Status: DC | PRN

## 2015-02-20 MED ORDER — SODIUM CHLORIDE 0.9 % IJ SOLN: 3.0000 mL | INTRAMUSCULAR | Status: DC | PRN

## 2015-02-20 MED ORDER — ASPIRIN EC 81 MG PO TBEC
81.0000 mg | DELAYED_RELEASE_TABLET | Freq: Every day | ORAL | Status: DC
  Administered 2015-02-21 – 2015-02-24 (×4): 81 mg via ORAL
  Filled 2015-02-20 (×4): qty 1

## 2015-02-20 MED FILL — Potassium Chloride Inj 2 mEq/ML: INTRAVENOUS | Qty: 40 | Status: AC

## 2015-02-20 MED FILL — Magnesium Sulfate Inj 50%: INTRAMUSCULAR | Qty: 10 | Status: AC

## 2015-02-20 MED FILL — Heparin Sodium (Porcine) Inj 1000 Unit/ML: INTRAMUSCULAR | Qty: 30 | Status: AC

## 2015-02-20 NOTE — Progress Notes (Addendum)
TCTS DAILY ICU PROGRESS NOTE                   [name]            [place] [address]          [phone]   3 Days Post-Op Procedure(s) (LRB): CORONARY ARTERY BYPASS GRAFTING (CABG), ON PUMP, TIMES FIVE, USING LEFT INTERNAL MAMMARY ARTERY, RIGHT GREATER SAPHENOUS VEIN HARVESTED ENDOSCOPICALLY (N/A) TRANSESOPHAGEAL ECHOCARDIOGRAM (TEE) (N/A)  Total Length of Stay:  LOS: 14 days   Subjective: Feels well. Walked already this am. No complaints.   Objective: Vital signs in last 24 hours: Temp:  [98.1 F (36.7 C)-98.5 F (36.9 C)] 98.4 F (36.9 C) (07/01 0359) Pulse Rate:  [87-117] 95 (07/01 0700) Cardiac Rhythm:  [-] Normal sinus rhythm (07/01 0600) Resp:  [12-23] 16 (07/01 0700) BP: (119-153)/(50-79) 151/79 mmHg (07/01 0700) SpO2:  [94 %-100 %] 94 % (07/01 0700) Weight:  [209 lb (94.802 kg)] 209 lb (94.802 kg) (07/01 0500)  Filed Weights   02/18/15 0433 02/19/15 0500 02/20/15 0500  Weight: 207 lb 8 oz (94.121 kg) 208 lb 8.9 oz (94.6 kg) 209 lb (94.802 kg)    Weight change: 7.1 oz (0.202 kg)   Hemodynamic parameters for last 24 hours:    Intake/Output from previous day: 06/30 0701 - 07/01 0700 In: 1410.9 [P.O.:900; I.V.:510.9] Out: 1310 [Urine:1260; Chest Tube:50]  CBGs [id number]     Current Meds: Scheduled Meds: . acetaminophen  1,000 mg Oral 4 times per day   Or  . acetaminophen (TYLENOL) oral liquid 160 mg/5 mL  1,000 mg Per Tube 4 times per day  . aspirin EC  325 mg Oral Daily   Or  . aspirin  324 mg Per Tube Daily  . atorvastatin  80 mg Oral q1800  . bisacodyl  10 mg Oral Daily   Or  . bisacodyl  10 mg Rectal Daily  . docusate sodium  200 mg Oral Daily  . enoxaparin (LOVENOX) injection  30 mg Subcutaneous Q24H  . gabapentin  100 mg Oral TID  . insulin aspart  0-24 Units Subcutaneous TID AC & HS  . insulin detemir  10 Units Subcutaneous Daily  . magnesium sulfate  4 g Intravenous Once  . metoprolol tartrate  12.5 mg Oral BID   Or  . metoprolol tartrate  12.5 mg Per Tube BID  . pantoprazole  40 mg Oral Daily  . sodium chloride  3 mL Intravenous Q12H   Continuous Infusions: . sodium chloride Stopped (02/17/15 2000)  . sodium chloride    . sodium chloride Stopped (02/20/15 0500)  . dexmedetomidine Stopped (02/17/15 1550)  . lactated ringers    . lactated ringers Stopped (02/17/15 1330)  . nitroGLYCERIN Stopped (02/17/15 1325)  . phenylephrine (NEO-SYNEPHRINE) Adult infusion Stopped (02/17/15 1530)   PRN Meds:.sodium chloride, alum & mag hydroxide-simeth, hydroxypropyl methylcellulose / hypromellose, metoprolol, midazolam, ondansetron (ZOFRAN) IV, oxyCODONE, sodium chloride, traMADol  Physical Exam: General appearance: alert, cooperative and no distress Heart: regular rate and rhythm Lungs: clear to auscultation bilaterally Extremities: Mild LE edema Wound: Clean and dry    Lab Results: CBC: Recent Labs  02/19/15 0350 02/20/15 0500  WBC 18.6* 15.3*  HGB 9.3* 9.1*  HCT 29.0* 28.9*  PLT 157 186   BMET:  Recent Labs  02/19/15 0350 02/20/15 0500  NA 135 135  K 4.1 4.2  CL 101 102  CO2 27 27  GLUCOSE 138* 149*  BUN 33* 43*  CREATININE 2.10* 2.08*  CALCIUM 9.6 9.3    PT/INR:  Recent Labs  02/17/15 1322  LABPROT 16.9*  INR 1.36   Radiology: Dg Chest Port 1 View  02/20/2015   CLINICAL DATA:  Status post CABG on February 17, 2015  EXAM: PORTABLE CHEST - 1 VIEW  COMPARISON:  Portable chest x-ray of February 19, 2015  FINDINGS: The cardiopericardial silhouette remains enlarged. The pulmonary vascularity is mildly prominent centrally. The left hemidiaphragm is less well demonstrated today. There is a small left pleural effusion. The right lung is clear. There are 7 intact sternal wires. The right internal jugular venous catheter tip projects over the midportion of the SVC.  IMPRESSION: Slight interval increase in parenchymal density at the left lung base suggests subsegmental atelectasis and/or a small  pleural effusion. There is stable enlargement of the cardiac silhouette with mild central pulmonary vascular prominence.   Electronically Signed   By: David  Martinique M.D.   On: 02/20/2015 07:56     Assessment/Plan: S/P Procedure(s) (LRB): CORONARY ARTERY BYPASS GRAFTING (CABG), ON PUMP, TIMES FIVE, USING LEFT INTERNAL MAMMARY ARTERY, RIGHT GREATER SAPHENOUS VEIN HARVESTED ENDOSCOPICALLY (N/A) TRANSESOPHAGEAL ECHOCARDIOGRAM (TEE) (N/A)  CV- SR, BPs trending up. Will increase beta blocker, hold on restarting ARB for now due to increased Cr. Off all gtts.  Renal- Cr stabilizing.  UOP good.  Continue to monitor. Baseline Cr prior to admission was 1.7.  DM-CBGs stable on Levemir, SSI.  Expected postop blood loss anemia- H/H fairly stable.  Leukocytosis, no fever.  WBC trending down. Urine cx negative.  Vol overload- continue diuresis.  Hopefully can d/c Foley and central line and tx stepdown today.  Disp- PT recommends SNF at d/c. Pt initially wanted to pursue SNF as his wife has Alzheimer's , but now feels he may have adequate help at home. Will consult CM and SW to help with d/c planning.   COLLINS,GINA H 02/20/2015 8:14 AM  Continues to improve.  Has help at home prefers come care with home nurse and home pt To step down I have seen and examined Zachary Mcmahon and agree with the above assessment  and plan.  [name] MD [facility] [phone] Office [phone] 02/20/2015 10:02 AM

## 2015-02-20 NOTE — Clinical Social Work Placement (Signed)
   CLINICAL SOCIAL WORK PLACEMENT  NOTE  Date:  02/20/2015  Patient Details  Name: Zachary Mcmahon MRN: [id number] Date of Birth: Dec 23, 1931  Clinical Social Work is seeking post-discharge placement for this patient at the [place] level of care (*CSW will initial, date and re-position this form in  chart as items are completed):  Yes   Patient/family provided with [employer] Work Department's list of facilities offering this level of care within the geographic area requested by the patient (or if unable, by the patient's family).  Yes   Patient/family informed of their freedom to choose among providers that offer the needed level of care, that participate in Medicare, Medicaid or managed care program needed by the patient, have an available bed and are willing to accept the patient.  Yes   Patient/family informed of 's ownership interest in [facility] and [facility], as well as of the fact that they are under no obligation to receive care at these facilities.  PASRR submitted to EDS on 02/20/15     PASRR number received on       Existing PASRR number confirmed on 02/20/15     FL2 transmitted to all facilities in geographic area requested by pt/family on 02/20/15     FL2 transmitted to all facilities within larger geographic area on       Patient informed that his/her managed care company has contracts with or will negotiate with certain facilities, including the following:            Patient/family informed of bed offers received.  Patient chooses bed at       Physician recommends and patient chooses bed at      Patient to be transferred to   on  .  Patient to be transferred to facility by       Patient family notified on   of transfer.  Name of family member notified:        PHYSICIAN Please sign FL2     Additional Comment:    _______________________________________________ Ross Ludwig, LCSWA 02/20/2015,  12:58 PM

## 2015-02-20 NOTE — Discharge Summary (Signed)
Physician Discharge Summary  Patient ID: Zachary Mcmahon MRN: [id number] DOB/AGE: Dec 15, 1931 81 y.o.  Admit date: 02/06/2015 Discharge date: 02/24/2015  Admission Diagnoses:  Patient Active Problem List   Diagnosis Date Noted  . Pressure ulcer 02/14/2015  . CHF (congestive heart failure)   . Renal insufficiency   . Acute respiratory failure with hypoxia 02/12/2015  . Arterial hypotension   . Pulmonary edema   . Acute on chronic combined systolic and diastolic HF (heart failure)   . Abdominal distension   . Acute renal failure superimposed on stage 3 chronic kidney disease   . NSTEMI (non-ST elevated myocardial infarction)   . Non-STEMI (non-ST elevated myocardial infarction) 02/07/2015  . Unstable angina 02/06/2015  . CKD (chronic kidney disease) stage 3, GFR 30-59 ml/min 02/06/2015  . Chest pain 02/06/2015  . Abdominal pain, acute, generalized 02/06/2015  . Diarrhea 02/06/2015  . Swelling of extremity, left, chronic 02/06/2015  . Ileus 02/06/2015  . Hyperlipidemia 04/23/2014  . Coronary artery disease 04/23/2014  . Varicose veins of lower extremities with other complications [id number]  . Unspecified hemorrhoids with other complication [id number]  . Intertrochanteric fracture of right hip 03/20/2012  . HTN (hypertension), malignant 03/20/2012  . DM type 2 (diabetes mellitus, type 2) 03/20/2012  . Acute renal failure 03/20/2012  . Thrombocytopenia 03/20/2012  . Hyperkalemia 03/20/2012   Discharge Diagnoses:   Patient Active Problem List   Diagnosis Date Noted  . 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 Pressure ulcer 02/14/2015  . CHF (congestive heart failure)   . Renal insufficiency   . Acute respiratory failure with hypoxia 02/12/2015  . Arterial hypotension   . Pulmonary edema   . Acute on chronic combined systolic and diastolic HF (heart failure)   . Abdominal distension   . Acute renal failure superimposed on stage 3 chronic kidney disease   . NSTEMI (non-ST elevated  myocardial infarction)   . Non-STEMI (non-ST elevated myocardial infarction) 02/07/2015  . Unstable angina 02/06/2015  . CKD (chronic kidney disease) stage 3, GFR 30-59 ml/min 02/06/2015  . Chest pain 02/06/2015  . Abdominal pain, acute, generalized 02/06/2015  . Diarrhea 02/06/2015  . Swelling of extremity, left, chronic 02/06/2015  . Ileus 02/06/2015  . Hyperlipidemia 04/23/2014  . Coronary artery disease 04/23/2014  . Varicose veins of lower extremities with other complications [id number]  . Unspecified hemorrhoids with other complication [id number]  . Intertrochanteric fracture of right hip 03/20/2012  . HTN (hypertension), malignant 03/20/2012  . DM type 2 (diabetes mellitus, type 2) 03/20/2012  . Acute renal failure 03/20/2012  . Thrombocytopenia 03/20/2012  . Hyperkalemia 03/20/2012   Discharged Condition: good  History of Present Illness:  [name] is a very pleasant 81 yo white male with known history of DM, Hyperlipidemia, HTN, CKD, and H/O MI in the past. He presented to [facility] ED with complaints of chest pain. These developed at home, with patient feeling like it could be reflux. However, it was also similar in nature to his previous MI symptoms. The pain was located in his upper chest and radiated down both arms. There was no associated dyspnea, nausea, vomiting or palpitations. EMS was called and treated patient with ASA. In ED he was given NTG with relief in discomfort. Workup revealed EKG with no ST changes but positive cardiac enzymes. He was ruled in for NSTEMI and transferred to [facility] for further care. Upon arrival patient was admitted via Cardiology. He stated he had a stress test before, but no cardiac catheterization.  He underwent cardiac catheterization which showed severe multivessel CAD with a preserved EF. It was felt Coronary Bypass Grafting would be indicated and TCTS consult was obtained.   Hospital Course:   He was evaluated by  Dr. Servando Snare for Coronary Bypass Grafting procedure.  It was felt coronary bypass grafting would be indicated.  However on exam patient was found to have bilateral varicose veins. He also has chronic swelling in his LLE. He has been ruled out for DVT. 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  It was felt the patient should undergo Lower Extremity vein mapping to ensure Saphenous vein would be suitable for conduit.  These were done and showed the left saphenous vein was partially thrombosed from groin to ankle.  The right saphenous vein was suitable for harvest.  However while in the vascular lab the patient developed chest pain.  Further studies were canceled and the patient was taken to the CCU for closer monitoring.  He later developed further chest pain and shortness of breath.  He was started on Lasix, Bipap, NTG, and Labetalol.  He later vomited a large amount of undigested food.  Despite these measures the patient developed worsening shortness of breath requiring intubation. He spiked a temperature of 102 and was started on Empiric ABX and critical care helped with the patients treatment.  The patients situation improved without further complications.  He was extubated on 02/12/2015.  He continued to receive treatment with IV ABX and his fever and leukocytosis slowly resolved.    He was felt medically stable to proceed with CABG procedure.  He was taken to the operating room on 02/17/2015.  He underwent CABG x 5 utilizing LIMA to LAD, SVG to DX, SEQ SVG to OM1 and PLB, and SVG to PDA.  He also underwent Endoscopic harvest of the greater saphenous vein from the right leg.  He tolerated the procedure without significant difficulty and was taken to the SICU in stable condition.  The patient was extubated the evening of surgery.  During his stay in  the SICU the patient was weaned off Dopamine and Milrinone as tolerated.  His chest tubes and arterial lines were removed without difficulty.  His creatinine was mildly elevated and all nephrotoxic agents were held.  He was maintaining NSR but was tachycardic and hypertensive and his Lopressor was titrated as tolerated.  He was ambulating around the SICU with minimal difficulty and felt medically stable for transfer to the step down unit. His creatinine gradually decreased. It was last 1.64. He is ambulating on room air. He is tolerating a diet and has had a bowel movement. He was started on Ferrous and folic acid as he was anemic. His last H and H was 8.8 and 27.2 Epicardial pacing wires will be removed today. Chest tube sutures will be removed prior to discharge. He is felt surgically stable for discharge to the SNF today.  We ask that the SNF please do the following: 1. Please obtain vital signs at least one time daily 2.Please weigh the patient daily. If he or she continues to gain weight or develops lower extremity edema, contact the office at [phone]. 3. Ambulate patient at least three times daily and please use sternal precautions.  Consults: cardiology and pulmonary/intensive care  Significant Diagnostic Studies: angiography:    Severe multivessel CAD  Prox RCA to Mid RCA lesion,  100% stenosed. L-R collaterals from Septal perforators & OM2-dCx.  Ost LAD lesion, 80% stenosed. Mid LAD lesion, 95% stenosed. 1st Diag-1 lesion, tandem 80% & 90% stenoses  Ost Cx lesion, 40% stenosed. Prox Cx lesion, 70% stenosed at OM1, Mid Cx lesion, 95% stenosed just beyond the takeoff of OM2.  Known mild-moderately reduced LVEF - normal LVEDP.  Treatments: surgery:   Coronary artery bypass grafting x5 with left internal mammary to the left anterior descending coronary artery, reverse saphenous vein graft to the diagonal coronary artery, sequential reverse saphenous vein graft to the obtuse  marginal and to the posterolateral branch of the right coronary artery, reverse saphenous vein graft to the posterior descending coronary artery with right leg endo-vein harvesting thigh and calf greater saphenous vein, right.  Disposition: 01-Home or Self Care   Discharge Medications:   Medication List    STOP taking these medications        insulin aspart 100 UNIT/ML injection  Commonly known as:  novoLOG     labetalol 300 MG tablet  Commonly known as:  NORMODYNE     losartan 50 MG tablet  Commonly known as:  COZAAR      TAKE these medications        aspirin EC 81 MG tablet  Take 81 mg by mouth every morning.     atorvastatin 20 MG tablet  Commonly known as:  LIPITOR  TAKE 1 TABLET BY MOUTH DAILY AT 6:00 P.M. ( MUST SCHEDULE AN APPOINTMENT FOR FUTURE REFILLS )     cloNIDine 0.2 MG tablet  Commonly known as:  CATAPRES  Take 0.2 mg by mouth 2 (two) times daily.     ferrous sulfate 325 (65 FE) MG tablet  Take 1 tablet (325 mg total) by mouth daily with breakfast.     furosemide 40 MG tablet  Commonly known as:  LASIX  Take 1 tablet (40 mg total) by mouth daily.     gabapentin 100 MG capsule  Commonly known as:  NEURONTIN  Take 100 mg by mouth 3 (three) times daily.     LEVEMIR FLEXTOUCH 100 UNIT/ML Pen  Generic drug:  Insulin Detemir  Inject 36 Units into the skin at bedtime.     metoprolol tartrate 25 MG tablet  Commonly known as:  LOPRESSOR  Take 1 tablet (25 mg total) by mouth 2 (two) times daily.     omeprazole 20 MG capsule  Commonly known as:  PRILOSEC  Take 20 mg by mouth daily.     potassium chloride 10 MEQ tablet  Commonly known as:  K-DUR,KLOR-CON  Take 1 tablet (10 mEq total) by mouth daily.     traMADol 50 MG tablet  Commonly known as:  ULTRAM  Take 1 tablet (50 mg total) by mouth every 6 (six) hours as needed for moderate pain.       The patient has been discharged on:   1.Beta Blocker:  Yes [ x  ]                              No   [    ]                              If No, reason:  2.Ace Inhibitor/ARB: Yes [   ]  No  [   x ]                                     If No, reason: Elevated Creatinine  3.Statin:   Yes [ x  ]                  No  [   ]                  If No, reason:  4.Ecasa:  Yes  [ x  ]                  No   [   ]                  If No, reason:    Follow-up Information    Follow up with [name], MD On 04/02/2015.   Specialty:  Cardiothoracic Surgery   Why:  Appointment is at 10:30   Contact information:   [place] [address] [phone]       Follow up with [facility] On 04/02/2015.   Why:  Please get CXR at 10:00   Contact information:   [facility]       Follow up with Quay Burow, MD.   Specialties:  Cardiology, Radiology   Why:  Call for a follow up appointment for 2 weeks   Contact information:   [address] [place] [address] [phone]       Signed: Nani Skillern PA-C 02/24/2015, 7:37 AM

## 2015-02-20 NOTE — Progress Notes (Signed)
MD [name] was made aware of pt's decreasing UOP. Will monitor  Zachary Mcmahon

## 2015-02-20 NOTE — Progress Notes (Signed)
Nutrition Follow-up   INTERVENTION:  Continue with Heart Healthy diet  NUTRITION DIAGNOSIS:  Increased nutrient needs related to  (recent surgery) as evidenced by estimated needs.  ongoing  GOAL:  Patient will meet greater than or equal to 90% of their needs  Met.   MONITOR:  PO intake, Supplement acceptance, Labs, Weight trends, I & O's, Skin   ASSESSMENT:  81 y.o with severe Htn, CKD, severe 3V CAD admitted6/17 with nstemI, transferred to ICU for flash pulmonary edema 6/20, on BiPAP pt vomited and failed then intubated.   Pt extubated 6/23 CABG on 6/28  Appetite good, consuming 100% of his meals. Labs reviewed: cbg's: 161-170 Medications reviewed and include: colace, lasix     Height:  Ht Readings from Last 1 Encounters:  02/07/15 '5\' 9"'  (1.753 m)    Weight:  Wt Readings from Last 1 Encounters:  02/20/15 209 lb (94.802 kg)    Ideal Body Weight:  72.7 kg  Wt Readings from Last 10 Encounters:  02/20/15 209 lb (94.802 kg)  04/23/14 212 lb (96.163 kg)  10/23/12 206 lb (93.441 kg)  07/11/12 199 lb 8 oz (90.493 kg)  03/21/12 233 lb 14.4 oz (106.096 kg)    BMI:  Body mass index is 30.85 kg/(m^2).  Estimated Nutritional Needs:  Kcal:  1800-2000  Protein:  90-100 grams  Fluid:  1.8-2.0 L  Skin:  Reviewed, no issues  Diet Order:  Diet heart healthy/carb modified Room service appropriate?: Yes; Fluid consistency:: Thin  EDUCATION NEEDS:  No education needs identified at this time   Intake/Output Summary (Last 24 hours) at 02/20/15 1335 Last data filed at 02/20/15 1200  Gross per 24 hour  Intake  880.1 ml  Output    870 ml  Net   10.1 ml    Last BM:  6/28  Maylon Peppers RD, [place], [place] Pager [phone] After Hours Pager

## 2015-02-20 NOTE — Progress Notes (Signed)
SICU p.m. Rounds   Patient examined and record reviewed.Hemodynamics stable,labs satisfactory.Patient had stable day.Continue current care. 1 dose IV Lasix ordered this p.m. for urine output trending down with stable blood pressure Tharon Aquas Trigt III 02/20/2015

## 2015-02-20 NOTE — Clinical Social Work Note (Signed)
Patient gave permission for CSW to fax out patients information in case he does decide to go to a SNF instead of home with home health.  FL2 on chart to be signed by physician, patient's information has been sent out to SNF facilities.  Jones Broom. [place], MSW, [place] 02/20/2015 12:57 PM

## 2015-02-20 NOTE — Clinical Social Work Note (Signed)
Clinical Social Work Assessment  Patient Details  Name: Zachary Mcmahon MRN: [id number] Date of Birth: 1931/09/11  Date of referral:  02/20/15               Reason for consult:  Facility Placement                Permission sought to share information with:  Family Supports Permission granted to share information::  Yes, Verbal Permission Granted  Name::        Agency::  SNF admissions workers  Relationship::     Contact Information:     Housing/Transportation Living arrangements for the past 2 months:  Single Family Home Source of Information:  Patient Patient Interpreter Needed:  None Criminal Activity/Legal Involvement Pertinent to Current Situation/Hospitalization:  No - Comment as needed Significant Relationships:  Adult Children, Spouse Lives with:  Spouse, Other (Comment) (Caregiver helps take care of wife who has Alzheimer's) Do you feel safe going back to the place where you live?  Yes Need for family participation in patient care:  No (Coment)  Care giving concerns: Patient was initially saying he would like to go to SNF in [place], but now he feels he has enough help at home.   Social Worker assessment / plan:  CSW met with patient and his family who were at bedside to discuss SNF options.  Patient stated he was talking to the physician today and patient feels like he has enough help at home.  Patient states his wife has Alzheimer's and he would rather have someone help him in his home, and have home health PT and nursing.  Patient is alert and oriented x4 and able to make his own decisions.  Patient's family are in agreement that he will have enough help in his home.  Patient has a private caregiver that helps out within their home.  Patient states it is okay to send his information to look for a SNF in [place] in case he changes his mind.  CSW will begin SNF placement and search process.   Employment status:  Retired Nurse, adult PT Recommendations:   [place] / Referral to community resources:     Patient/Family's Response to care:  Patient would prefer to go home and has private caregivers, but is open to going to SNF if decides he wants to go.  Patient/Family's Understanding of and Emotional Response to Diagnosis, Current Treatment, and Prognosis:  Patient expresses understanding of his diagnosis, and his recovery process.  Patient still feels like he would like to go home with home health instead of a SNF for short term rehab.  Emotional Assessment Appearance:  Appears stated age Attitude/Demeanor/Rapport:    Affect (typically observed):  Pleasant, Appropriate, Calm, Stable Orientation:  Oriented to Self, Oriented to Place, Oriented to  Time, Oriented to Situation Alcohol / Substance use:  Not Applicable Psych involvement (Current and /or in the community):  No (Comment)  Discharge Needs  Concerns to be addressed:  Other (Comment Required (Patient would like some home health) Readmission within the last 30 days:  No Current discharge risk:  None Barriers to Discharge:  No Barriers Identified   Ross Ludwig, LCSWA 02/20/2015, 12:50 PM

## 2015-02-21 ENCOUNTER — Inpatient Hospital Stay (HOSPITAL_COMMUNITY): Payer: Medicare Other

## 2015-02-21 LAB — BASIC METABOLIC PANEL
Anion gap: 6 (ref 5–15)
BUN: 48 mg/dL — ABNORMAL HIGH (ref 6–20)
CO2: 26 mmol/L (ref 22–32)
Calcium: 9.1 mg/dL (ref 8.9–10.3)
Chloride: 101 mmol/L (ref 101–111)
Creatinine, Ser: 1.81 mg/dL — ABNORMAL HIGH (ref 0.61–1.24)
GFR calc Af Amer: 39 mL/min — ABNORMAL LOW (ref >60)
GFR calc non Af Amer: 34 mL/min — ABNORMAL LOW (ref >60)
Glucose, Bld: 172 mg/dL — ABNORMAL HIGH (ref 65–99)
Potassium: 4.1 mmol/L (ref 3.5–5.1)
Sodium: 133 mmol/L — ABNORMAL LOW (ref 135–145)

## 2015-02-21 LAB — CBC
HCT: 28.4 % — ABNORMAL LOW (ref 39.0–52.0)
Hemoglobin: 9.1 g/dL — ABNORMAL LOW (ref 13.0–17.0)
MCH: 29.4 pg (ref 26.0–34.0)
MCHC: 32 g/dL (ref 30.0–36.0)
MCV: 91.6 fL (ref 78.0–100.0)
Platelets: 213 10*3/uL (ref 150–400)
RBC: 3.1 MIL/uL — ABNORMAL LOW (ref 4.22–5.81)
RDW: 13.8 % (ref 11.5–15.5)
WBC: 14.5 10*3/uL — ABNORMAL HIGH (ref 4.0–10.5)

## 2015-02-21 LAB — GLUCOSE, CAPILLARY
GLUCOSE-CAPILLARY: 212 mg/dL — AB (ref 65–99)
Glucose-Capillary: 133 mg/dL — ABNORMAL HIGH (ref 65–99)
Glucose-Capillary: 165 mg/dL — ABNORMAL HIGH (ref 65–99)
Glucose-Capillary: 131 mg/dL — ABNORMAL HIGH (ref 65–99)

## 2015-02-21 MED ORDER — FUROSEMIDE 10 MG/ML IJ SOLN
40.0000 mg | Freq: Once | INTRAMUSCULAR | Status: AC
  Administered 2015-02-21: 40 mg via INTRAVENOUS

## 2015-02-21 NOTE — Progress Notes (Signed)
4 Days Post-Op Procedure(s) (LRB): CORONARY ARTERY BYPASS GRAFTING (CABG), ON PUMP, TIMES FIVE, USING LEFT INTERNAL MAMMARY ARTERY, RIGHT GREATER SAPHENOUS VEIN HARVESTED ENDOSCOPICALLY (N/A) TRANSESOPHAGEAL ECHOCARDIOGRAM (TEE) (N/A) Subjective: Unsteady on feet due to neuropathy CXR clear Creat better Objective: Vital signs in last 24 hours: Temp:  [96.7 F (35.9 C)-98.3 F (36.8 C)] 97.9 F (36.6 C) (07/02 0809) Pulse Rate:  [49-116] 116 (07/02 0900) Cardiac Rhythm:  [-] Normal sinus rhythm (07/02 0800) Resp:  [13-22] 14 (07/02 0900) BP: (111-147)/(39-74) 147/72 mmHg (07/02 0900) SpO2:  [94 %-100 %] 98 % (07/02 0900) Weight:  [209 lb 3.5 oz (94.9 kg)] 209 lb 3.5 oz (94.9 kg) (07/02 0600)  Hemodynamic parameters for last 24 hours:   nsr Intake/Output from previous day: 07/01 0701 - 07/02 0700 In: 120 [P.O.:120] Out: 1050 [Urine:1050] Intake/Output this shift: Total I/O In: -  Out: 350 [Urine:350]  Incisions clean  Lab Results:  Recent Labs  02/20/15 0500 02/21/15 0212  WBC 15.3* 14.5*  HGB 9.1* 9.1*  HCT 28.9* 28.4*  PLT 186 213   BMET:  Recent Labs  02/20/15 0500 02/21/15 0212  NA 135 133*  K 4.2 4.1  CL 102 101  CO2 27 26  GLUCOSE 149* 172*  BUN 43* 48*  CREATININE 2.08* 1.81*  CALCIUM 9.3 9.1    PT/INR: No results for input(s): LABPROT, INR in the last 72 hours. ABG    Component Value Date/Time   PHART 7.352 02/17/2015 1805   HCO3 25.4* 02/17/2015 1805   TCO2 22 02/18/2015 1610   ACIDBASEDEF 3.0* 02/10/2015 2046   O2SAT 94.0 02/17/2015 1805   CBG (last 3)   Recent Labs  02/20/15 1553 02/20/15 2231 02/21/15 0806  GLUCAP 157* 155* 133*    Assessment/Plan: S/P Procedure(s) (LRB): CORONARY ARTERY BYPASS GRAFTING (CABG), ON PUMP, TIMES FIVE, USING LEFT INTERNAL MAMMARY ARTERY, RIGHT GREATER SAPHENOUS VEIN HARVESTED ENDOSCOPICALLY (N/A) TRANSESOPHAGEAL ECHOCARDIOGRAM (TEE) (N/A) Mobilize Diuresis Diabetes control Plan for transfer to  step-down: see transfer orders   LOS: 15 days    Zachary Mcmahon 02/21/2015

## 2015-02-21 NOTE — Progress Notes (Signed)
Pt t/x to 2W22 in wheelchair on monitor. Pt's glasses, cellphone and charger in his possession. Pts son, Zachary Mcmahon made aware of room change. Gae Bon RN present for pt arrival and tele hookup Lorre Munroe

## 2015-02-22 ENCOUNTER — Inpatient Hospital Stay (HOSPITAL_COMMUNITY): Payer: Medicare Other

## 2015-02-22 LAB — BASIC METABOLIC PANEL
Anion gap: 7 (ref 5–15)
BUN: 46 mg/dL — ABNORMAL HIGH (ref 6–20)
CO2: 27 mmol/L (ref 22–32)
Calcium: 9.1 mg/dL (ref 8.9–10.3)
Chloride: 101 mmol/L (ref 101–111)
Creatinine, Ser: 1.76 mg/dL — ABNORMAL HIGH (ref 0.61–1.24)
GFR calc Af Amer: 40 mL/min — ABNORMAL LOW (ref >60)
GFR calc non Af Amer: 35 mL/min — ABNORMAL LOW (ref >60)
Glucose, Bld: 152 mg/dL — ABNORMAL HIGH (ref 65–99)
Potassium: 4.2 mmol/L (ref 3.5–5.1)
Sodium: 135 mmol/L (ref 135–145)

## 2015-02-22 LAB — CBC
HCT: 27.2 % — ABNORMAL LOW (ref 39.0–52.0)
Hemoglobin: 8.8 g/dL — ABNORMAL LOW (ref 13.0–17.0)
MCH: 29.5 pg (ref 26.0–34.0)
MCHC: 32.4 g/dL (ref 30.0–36.0)
MCV: 91.3 fL (ref 78.0–100.0)
Platelets: 249 10*3/uL (ref 150–400)
RBC: 2.98 MIL/uL — ABNORMAL LOW (ref 4.22–5.81)
RDW: 13.8 % (ref 11.5–15.5)
WBC: 13.2 10*3/uL — ABNORMAL HIGH (ref 4.0–10.5)

## 2015-02-22 LAB — GLUCOSE, CAPILLARY
GLUCOSE-CAPILLARY: 157 mg/dL — AB (ref 65–99)
GLUCOSE-CAPILLARY: 220 mg/dL — AB (ref 65–99)
Glucose-Capillary: 171 mg/dL — ABNORMAL HIGH (ref 65–99)
Glucose-Capillary: 150 mg/dL — ABNORMAL HIGH (ref 65–99)

## 2015-02-22 MED ORDER — FOLIC ACID 1 MG PO TABS
1.0000 mg | ORAL_TABLET | Freq: Every day | ORAL | Status: DC
  Administered 2015-02-22 – 2015-02-24 (×3): 1 mg via ORAL
  Filled 2015-02-22 (×3): qty 1

## 2015-02-22 MED ORDER — FERROUS SULFATE 325 (65 FE) MG PO TABS
325.0000 mg | ORAL_TABLET | Freq: Every day | ORAL | Status: DC
  Administered 2015-02-22 – 2015-02-24 (×3): 325 mg via ORAL
  Filled 2015-02-22 (×4): qty 1

## 2015-02-22 NOTE — Discharge Instructions (Signed)
We ask the SNF to please do the following: 1. Please obtain vital signs at least one time daily 2.Please weigh the patient daily. If he or she continues to gain weight or develops lower extremity edema, contact the office at [phone]. 3. Ambulate patient at least three times daily and please use sternal precautions.   Coronary Artery Bypass Grafting, Care After Refer to this sheet in the next few weeks. These instructions provide you with information on caring for yourself after your procedure. Your health care provider may also give you more specific instructions. Your treatment has been planned according to current medical practices, but problems sometimes occur. Call your health care provider if you have any problems or questions after your procedure. WHAT TO EXPECT AFTER THE PROCEDURE Recovery from surgery will be different for everyone. Some people feel well after 3 or 4 weeks, while for others it takes longer. After your procedure, it is typical to have the following:  Nausea and a lack of appetite.   Constipation.  Weakness and fatigue.   Depression or irritability.   Pain or discomfort at your incision site. HOME CARE INSTRUCTIONS  Take medicines only as directed by your health care provider. Do not stop taking medicines or start any new medicines without first checking with your health care provider.  Take your pulse as directed by your health care provider.  Perform deep breathing as directed by your health care provider. If you were given a device called an incentive spirometer, use it to practice deep breathing several times a day. Support your chest with a pillow or your arms when you take deep breaths or cough.  Keep incision areas clean, dry, and protected. Remove or change any bandages (dressings) only as directed by your health care provider. You may have skin adhesive strips over the incision areas. Do not take the strips off. They will fall off on their  own.  Check incision areas daily for any swelling, redness, or drainage.  If incisions were made in your legs, do the following:  Avoid crossing your legs.   Avoid sitting for long periods of time. Change positions every 30 minutes.   Elevate your legs when you are sitting.  Wear compression stockings as directed by your health care provider. These stockings help keep blood clots from forming in your legs.  Take showers once your health care provider approves. Until then, only take sponge baths. Pat incisions dry. Do not rub incisions with a washcloth or towel. Do not take baths, swim, or use a hot tub until your health care provider approves.  Eat foods that are high in fiber, such as raw fruits and vegetables, whole grains, beans, and nuts. Meats should be lean cut. Avoid canned, processed, and fried foods.  Drink enough fluid to keep your urine clear or pale yellow.  Weigh yourself every day. This helps identify if you are retaining fluid that may make your heart and lungs work harder.  Rest and limit activity as directed by your health care provider. You may be instructed to:  Stop any activity at once if you have chest pain, shortness of breath, irregular heartbeats, or dizziness. Get help right away if you have any of these symptoms.  Move around frequently for short periods or take short walks as directed by your health care provider. Increase your activities gradually. You may need physical therapy or cardiac rehabilitation to help strengthen your muscles and build your endurance.  Avoid lifting, pushing, or pulling anything  heavier than 10 lb (4.5 kg) for at least 6 weeks after surgery.  Do not drive until your health care provider approves.  Ask your health care provider when you may return to work.  Ask your health care provider when you may resume sexual activity.  Keep all follow-up visits as directed by your health care provider. This is important. SEEK MEDICAL  CARE IF:  You have swelling, redness, increasing pain, or drainage at the site of an incision.  You have a fever.  You have swelling in your ankles or legs.  You have pain in your legs.   You gain 2 or more pounds (0.9 kg) a day.  You are nauseous or vomit.  You have diarrhea. SEEK IMMEDIATE MEDICAL CARE IF:  You have chest pain that goes to your jaw or arms.  You have shortness of breath.   You have a fast or irregular heartbeat.   You notice a "clicking" in your breastbone (sternum) when you move.   You have numbness or weakness in your arms or legs.  You feel dizzy or light-headed.  MAKE SURE YOU:  Understand these instructions.  Will watch your condition.  Will get help right away if you are not doing well or get worse. Document Released: 02/25/2005 Document Revised: 12/23/2013 Document Reviewed: 01/15/2013 [facility] Patient Information 2015 [place], [place]. This information is not intended to replace advice given to you by your health care provider. Make sure you discuss any questions you have with your health care provider.  Endoscopic Saphenous Vein Harvesting Care After Refer to this sheet in the next few weeks. These instructions provide you with information on caring for yourself after your procedure. Your health care provider may also give you more specific instructions. Your treatment has been planned according to current medical practices, but problems sometimes occur. Call your health care provider if you have any problems or questions after your procedure. HOME CARE INSTRUCTIONS Medicine  Take whatever pain medicine your surgeon prescribes. Follow the directions carefully. Do not take over-the-counter pain medicine unless your surgeon says it is okay. Some pain medicine can cause bleeding problems for several weeks after surgery.  Follow your surgeon's instructions about driving. You will probably not be permitted to drive after heart surgery.  Take  any medicines your surgeon prescribes. Any medicines you took before your heart surgery should be checked with your health care provider before you start taking them again. Wound care  If your surgeon has prescribed an elastic bandage or stocking, ask how long you should wear it.  Check the area around your surgical cuts (incisions) whenever your bandages (dressings) are changed. Look for any redness or swelling.  You will need to return to have the stitches (sutures) or staples taken out. Ask your surgeon when to do that.  Ask your surgeon when you can shower or bathe. Activity  Try to keep your legs raised when you are sitting.  Do any exercises your health care providers have given you. These may include deep breathing exercises, coughing, walking, or other exercises. SEEK MEDICAL CARE IF:  You have any questions about your medicines.  You have more leg pain, especially if your pain medicine stops working.  New or growing bruises develop on your leg.  Your leg swells, feels tight, or becomes red.  You have numbness in your leg. SEEK IMMEDIATE MEDICAL CARE IF:  Your pain gets much worse.  Blood or fluid leaks from any of the incisions.  Your incisions become  warm, swollen, or red.  You have chest pain.  You have trouble breathing.  You have a fever.  You have more pain near your leg incision. MAKE SURE YOU:  Understand these instructions.  Will watch your condition.  Will get help right away if you are not doing well or get worse. Document Released: 04/20/2011 Document Revised: 08/13/2013 Document Reviewed: 04/20/2011 [facility] Patient Information 2015 [place], [place]. This information is not intended to replace advice given to you by your health care provider. Make sure you discuss any questions you have with your health care provider.

## 2015-02-22 NOTE — Progress Notes (Signed)
Pt ambulated in hall with front wheel walker, 150 feet. He was limited by left foot pain. Assisted to chair for supper.

## 2015-02-22 NOTE — Progress Notes (Signed)
Pt ambulated in hall with front wheel walker very slowly. He is limited by neuropathic pain with "stinging and burning" just above left ankle. He denied chest pain or dyspnea. Returned to chair with legs elevated, call bell in reach.

## 2015-02-22 NOTE — Progress Notes (Signed)
Epicardial pacing wires X 2 removed per protocol without difficulty. VSS and heart rhythm stable. Pt to maintain bedrest with VS monitoring for 1 hour. Pt educated and verbalizes understanding.

## 2015-02-22 NOTE — Progress Notes (Addendum)
      [address]       [place] [id number]             [phone]        5 Days Post-Op Procedure(s) (LRB): CORONARY ARTERY BYPASS GRAFTING (CABG), ON PUMP, TIMES FIVE, USING LEFT INTERNAL MAMMARY ARTERY, RIGHT GREATER SAPHENOUS VEIN HARVESTED ENDOSCOPICALLY (N/A) TRANSESOPHAGEAL ECHOCARDIOGRAM (TEE) (N/A)  Subjective: Patient a little tired this am.  Objective: Vital signs in last 24 hours: Temp:  [97.5 F (36.4 C)-98.2 F (36.8 C)] 98.2 F (36.8 C) (07/03 0531) Pulse Rate:  [73-116] 81 (07/03 0531) Cardiac Rhythm:  [-] Normal sinus rhythm (07/02 1930) Resp:  [11-20] 18 (07/03 0531) BP: (109-159)/(50-72) 121/50 mmHg (07/03 0531) SpO2:  [96 %-100 %] 96 % (07/03 0531) Weight:  [198 lb 12.8 oz (90.175 kg)] 198 lb 12.8 oz (90.175 kg) (07/03 0531)  Pre op weight 91.6 kg Current Weight  02/22/15 198 lb 12.8 oz (90.175 kg)      Intake/Output from previous day: 07/02 0701 - 07/03 0700 In: 150 [P.O.:150] Out: 2295 [Urine:2295]   Physical Exam:  Cardiovascular: RRR Pulmonary: Diminished at bases; no rales, wheezes, or rhonchi. Abdomen: Soft, non tender, bowel sounds present. Extremities: Mild bilateral lower extremity edema. Wounds: Clean and dry.  No erythema or signs of infection.  Lab Results: CBC: Recent Labs  02/21/15 0212 02/22/15 0322  WBC 14.5* 13.2*  HGB 9.1* 8.8*  HCT 28.4* 27.2*  PLT 213 249   BMET:  Recent Labs  02/21/15 0212 02/22/15 0322  NA 133* 135  K 4.1 4.2  CL 101 101  CO2 26 27  GLUCOSE 172* 152*  BUN 48* 46*  CREATININE 1.81* 1.76*  CALCIUM 9.1 9.1    PT/INR:  Lab Results  Component Value Date   INR 1.36 02/17/2015   INR 1.21 02/16/2015   INR 1.16 02/09/2015   ABG:  INR: Will add last result for INR, ABG once components are confirmed Will add last 4 CBG results once components are confirmed  Assessment/Plan:  1. CV - SR in the 80's this am.On Clonidine 0.2 mg bid, Lopressor 25 mg bid. Will not  restart ARB yet as creatinine still elevated. 2.  Pulmonary - On room air. CXR appears to show no pneumothorax, bibasilar atelectasis, and small bilateral pleural effusions. Encourage incentive spirometer. 3. Volume Overload - On Lasix 40 mg daily 4.  Acute blood loss anemia - H and H 8.8 and 27.2. Start Ferrous and folic acid. 5.Creatinine slightly decreased from 1.81 to 1.76. Re check in am 6. DM-CBGs 165/131/157. On Insulin. Pre op HGA1C 7.5. Will need follow up with medical doctor after discharge. 7. Remove EPW 8. Will remove foley as discussed with Dr. [name] 9. Will need SNF [facility]) when ready for discharge  ZIMMERMAN,DONIELLE MPA-C 02/22/2015,8:19 AM  Walking with walker Creatinine improved- 1.7 nsr  DC foley  patient examined and medical record reviewed,agree with above note. Tharon Aquas Trigt III 02/22/2015  SNF tues/wed

## 2015-02-22 NOTE — Progress Notes (Addendum)
Pt strip this PM labeled as accelerated junctional by CCMD.  EKG performed showed NSR with 1st degree AVB, HR 83.  Pt is asymptomatic, pain free, and resting comfortably, VSS.  Will continue to monitor pt closely.  Claudette Stapler, RN

## 2015-02-22 NOTE — Progress Notes (Signed)
Pt ambulated in hall 300 feet with front wheel walker taking 2 brief standing rest breaks. He tolerated well. Back to bed for epicardial wire removal.

## 2015-02-23 DIAGNOSIS — Z951 Presence of aortocoronary bypass graft: Secondary | ICD-10-CM

## 2015-02-23 LAB — BASIC METABOLIC PANEL
Anion gap: 5 (ref 5–15)
BUN: 47 mg/dL — ABNORMAL HIGH (ref 6–20)
CO2: 27 mmol/L (ref 22–32)
Calcium: 9.3 mg/dL (ref 8.9–10.3)
Chloride: 103 mmol/L (ref 101–111)
Creatinine, Ser: 1.64 mg/dL — ABNORMAL HIGH (ref 0.61–1.24)
GFR calc Af Amer: 44 mL/min — ABNORMAL LOW (ref >60)
GFR calc non Af Amer: 38 mL/min — ABNORMAL LOW (ref >60)
GLUCOSE: 152 mg/dL — AB (ref 65–99)
Potassium: 4.4 mmol/L (ref 3.5–5.1)
Sodium: 135 mmol/L (ref 135–145)

## 2015-02-23 LAB — GLUCOSE, CAPILLARY
GLUCOSE-CAPILLARY: 166 mg/dL — AB (ref 65–99)
Glucose-Capillary: 135 mg/dL — ABNORMAL HIGH (ref 65–99)
Glucose-Capillary: 156 mg/dL — ABNORMAL HIGH (ref 65–99)
Glucose-Capillary: 162 mg/dL — ABNORMAL HIGH (ref 65–99)

## 2015-02-23 NOTE — Progress Notes (Signed)
Physical Therapy Treatment Patient Details Name: Zachary Mcmahon MRN: [id number] DOB: [birth year] Today's Date: 02/23/2015    History of Present Illness 81 yo male h/o dm, htn, abnormal myoview testing in 2013, no previous heart cath comes in with chest pain across his lower chest along with generalized abdominal pain. Pt with NSTEMI, and pulmonary edema, VDRF 6/21-6/23. s/p CABG x5 on 6/28.    PT Comments    Discussed with pt discharge plan and he reports he does not have enough assist for himself to d/c home and plans to go to short-term SNF. Pt making good progress, however does continue to need mod assist to stand from elevated surface. Also continues with decr balance when not using bil UE support. Will benefit from continued therapy.    Follow Up Recommendations  SNF;Supervision/Assistance - 24 hour (Wants to stay at facility near his home in [place])     Equipment Recommendations   (TBD at next venue of care.)    Recommendations for Other Services       Precautions / Restrictions Precautions Precautions: Fall;Sternal Precaution Comments: pt able to state sternal precautions; required cues x 3 to adhere Restrictions Other Position/Activity Restrictions: Sternal precautions    Mobility  Bed Mobility                  Transfers Overall transfer level: Needs assistance Equipment used: Rolling walker (2 wheeled) Transfers: Sit to/from Stand Sit to Stand: Mod assist;From elevated surface         General transfer comment: Cues for hand placement lightly on knees or holding pillow to maintain sternal precautions. Mod assist for boost to stand from chair with pillow in seat. vc for hand placement during stand to sit (off RW to knees)  Ambulation/Gait Ambulation/Gait assistance: Min guard Ambulation Distance (Feet): 150 Feet Assistive device: Rolling walker (2 wheeled) Gait Pattern/deviations: Step-through pattern;Decreased stride length;Drifts right/left;Trunk  flexed Gait velocity: slow   General Gait Details: Close guard for safety. Frequent VC for upright posture with forward gaze, and for walker control. Small shuffled steps but no overt loss of balance while steadying self with RW. Slight drift Lt and Rt with no overt stagger or LOB   Stairs            Wheelchair Mobility    Modified Rankin (Stroke Patients Only)       Balance Overall balance assessment: Needs assistance Sitting-balance support: No upper extremity supported;Feet supported Sitting balance-Leahy Scale: Fair     Standing balance support: No upper extremity supported Standing balance-Leahy Scale: Fair Standing balance comment: stood x 30 sec static with no UE support                    Cognition Arousal/Alertness: Awake/alert Behavior During Therapy: WFL for tasks assessed/performed Overall Cognitive Status: Within Functional Limits for tasks assessed                      Exercises General Exercises - Lower Extremity Ankle Circles/Pumps: AROM;Both;20 reps;Seated Quad Sets: AROM;Both;15 reps;Seated Long Arc Quad: AROM;Both;10 reps;Seated Hip Flexion/Marching: AAROM;Both;10 reps;Standing (assist for balance when uses no UE support) Toe Raises: AROM;AAROM;Both;20 reps;10 reps;Seated;Standing (AROM seated pre-standing; AAROM stnding for balance) Heel Raises: AROM;AAROM;Both;20 reps;10 reps;Seated;Standing Mini-Sqauts: AROM;Both;10 reps;Standing Other Exercises Other Exercises: turning 360 degrees no UE support--each way required 12 steps and 7 seconds (indicative of incr fall risk)    General Comments        Pertinent Vitals/Pain HR 90-121 with activity  Pain Assessment: No/denies pain    Home Living                      Prior Function            PT Goals (current goals can now be found in the care plan section) Acute Rehab PT Goals Patient Stated Goal: go fishing Time For Goal Achievement: 03/05/15 Progress towards PT  goals: Progressing toward goals    Frequency  Min 3X/week    PT Plan Current plan remains appropriate    Co-evaluation             End of Session Equipment Utilized During Treatment: Gait belt Activity Tolerance: Patient tolerated treatment well Patient left: in chair;with call bell/phone within reach     Time: 1128-1154 PT Time Calculation (min) (ACUTE ONLY): 26 min  Charges:  $Gait Training: 8-22 mins $Therapeutic Exercise: 8-22 mins                    G Codes:      Zachary Mcmahon Mar 09, 2015, 12:28 PM Pager [phone]

## 2015-02-23 NOTE — Progress Notes (Signed)
Subjective:  POD #5 CABG X 5, NSTEMI. Looks great. No CP/SOB  Objective:  Temp:  [97.7 F (36.5 C)-98 F (36.7 C)] 98 F (36.7 C) (07/04 0506) Pulse Rate:  [80-96] 80 (07/04 0506) Resp:  [16-19] 16 (07/04 0506) BP: (125-149)/(53-70) 130/59 mmHg (07/04 0506) SpO2:  [96 %-99 %] 96 % (07/04 0506) Weight:  [209 lb 7 oz (95 kg)] 209 lb 7 oz (95 kg) (07/04 0506) Weight change: 10 lb 10.2 oz (4.825 kg)  Intake/Output from previous day: 07/03 0701 - 07/04 0700 In: 1095 [P.O.:720] Out: 225 [Urine:225]  Intake/Output from this shift: Total I/O In: 240 [P.O.:240] Out: 200 [Urine:200]  Physical Exam: General appearance: alert and no distress Neck: no adenopathy, no carotid bruit, no JVD, supple, symmetrical, trachea midline and thyroid not enlarged, symmetric, no tenderness/mass/nodules Lungs: clear to auscultation bilaterally Heart: regular rate and rhythm, S1, S2 normal, no murmur, click, rub or gallop Extremities: extremities normal, atraumatic, no cyanosis or edema  Lab Results: Results for orders placed or performed during the hospital encounter of 02/06/15 (from the past 48 hour(s))  Glucose, capillary     Status: Abnormal   Collection Time: 02/21/15 11:40 AM  Result Value Ref Range   Glucose-Capillary 212 (H) 65 - 99 mg/dL   Comment 1 Capillary Specimen    Comment 2 Notify RN   Glucose, capillary     Status: Abnormal   Collection Time: 02/21/15  4:02 PM  Result Value Ref Range   Glucose-Capillary 165 (H) 65 - 99 mg/dL  Glucose, capillary     Status: Abnormal   Collection Time: 02/21/15  9:03 PM  Result Value Ref Range   Glucose-Capillary 131 (H) 65 - 99 mg/dL   Comment 1 Notify RN   CBC     Status: Abnormal   Collection Time: 02/22/15  3:22 AM  Result Value Ref Range   WBC 13.2 (H) 4.0 - 10.5 K/uL   RBC 2.98 (L) 4.22 - 5.81 MIL/uL   Hemoglobin 8.8 (L) 13.0 - 17.0 g/dL   HCT 27.2 (L) 39.0 - 52.0 %   MCV 91.3 78.0 - 100.0 fL   MCH 29.5 26.0 - 34.0 pg   MCHC  32.4 30.0 - 36.0 g/dL   RDW 13.8 11.5 - 15.5 %   Platelets 249 150 - 400 K/uL  Basic metabolic panel     Status: Abnormal   Collection Time: 02/22/15  3:22 AM  Result Value Ref Range   Sodium 135 135 - 145 mmol/L   Potassium 4.2 3.5 - 5.1 mmol/L   Chloride 101 101 - 111 mmol/L   CO2 27 22 - 32 mmol/L   Glucose, Bld 152 (H) 65 - 99 mg/dL   BUN 46 (H) 6 - 20 mg/dL   Creatinine, Ser 1.76 (H) 0.61 - 1.24 mg/dL   Calcium 9.1 8.9 - 10.3 mg/dL   GFR calc non Af Amer 35 (L) >60 mL/min   GFR calc Af Amer 40 (L) >60 mL/min    Comment: (NOTE) The eGFR has been calculated using the CKD EPI equation. This calculation has not been validated in all clinical situations. eGFR's persistently <60 mL/min signify possible Chronic Kidney Disease.    Anion gap 7 5 - 15  Glucose, capillary     Status: Abnormal   Collection Time: 02/22/15  6:37 AM  Result Value Ref Range   Glucose-Capillary 157 (H) 65 - 99 mg/dL  Glucose, capillary     Status: Abnormal   Collection Time:  02/22/15 11:05 AM  Result Value Ref Range   Glucose-Capillary 220 (H) 65 - 99 mg/dL  Glucose, capillary     Status: Abnormal   Collection Time: 02/22/15  4:05 PM  Result Value Ref Range   Glucose-Capillary 171 (H) 65 - 99 mg/dL  Glucose, capillary     Status: Abnormal   Collection Time: 02/22/15  9:32 PM  Result Value Ref Range   Glucose-Capillary 150 (H) 65 - 99 mg/dL   Comment 1 Notify RN   Basic metabolic panel     Status: Abnormal   Collection Time: 02/23/15  3:48 AM  Result Value Ref Range   Sodium 135 135 - 145 mmol/L   Potassium 4.4 3.5 - 5.1 mmol/L   Chloride 103 101 - 111 mmol/L   CO2 27 22 - 32 mmol/L   Glucose, Bld 152 (H) 65 - 99 mg/dL   BUN 47 (H) 6 - 20 mg/dL   Creatinine, Ser 1.64 (H) 0.61 - 1.24 mg/dL   Calcium 9.3 8.9 - 10.3 mg/dL   GFR calc non Af Amer 38 (L) >60 mL/min   GFR calc Af Amer 44 (L) >60 mL/min    Comment: (NOTE) The eGFR has been calculated using the CKD EPI equation. This calculation has  not been validated in all clinical situations. eGFR's persistently <60 mL/min signify possible Chronic Kidney Disease.    Anion gap 5 5 - 15  Glucose, capillary     Status: Abnormal   Collection Time: 02/23/15  6:16 AM  Result Value Ref Range   Glucose-Capillary 135 (H) 65 - 99 mg/dL    Imaging: Imaging results have been reviewed Tele: NSR 90s  Assessment/Plan:   1. Principal Problem: 2.   Chest pain 3. Active Problems: 4.   HTN (hypertension), malignant 5.   Hyperlipidemia 6.   Coronary artery disease 7.   CKD (chronic kidney disease) stage 3, GFR 30-59 ml/min 8.   Abdominal pain, acute, generalized 9.   Diarrhea 10.   Swelling of extremity, left, chronic 11.   Ileus 12.   Non-STEMI (non-ST elevated myocardial infarction) 13.   Abdominal distension 14.   Acute renal failure superimposed on stage 3 chronic kidney disease 15.   NSTEMI (non-ST elevated myocardial infarction) 16.   Pulmonary edema 17.   Acute on chronic combined systolic and diastolic HF (heart failure) 18.   Arterial hypotension 19.   Acute respiratory failure with hypoxia 20.   CHF (congestive heart failure) 21.   Renal insufficiency 22.   Pressure ulcer 23.   S/P CABG x 5 24.   Time Spent Directly with Patient:  20 minutes  Length of Stay:  LOS: 17 days   POD # 5 CABG X5 in setting of NSTEMI, 3VD with mild LV dysfunction. Looks great!!!! Ambulating w/o difficulty. VSS. Scr actually coming down. Nl progression. Per TCTS. Apparently going to the [facility] tomorrow for rehab (he's been there before) prior to returning home in [place]. He can follow up with [place] in our [place] or [place] office. Will S/O.   Quay Burow 02/23/2015, 8:16 AM

## 2015-02-23 NOTE — Progress Notes (Addendum)
       [address]       [place] [address]             [phone]          6 Days Post-Op Procedure(s) (LRB): CORONARY ARTERY BYPASS GRAFTING (CABG), ON PUMP, TIMES FIVE, USING LEFT INTERNAL MAMMARY ARTERY, RIGHT GREATER SAPHENOUS VEIN HARVESTED ENDOSCOPICALLY (N/A) TRANSESOPHAGEAL ECHOCARDIOGRAM (TEE) (N/A)  Subjective: Feels well.  Appetite good, voiding without difficulty since Foley d/c'ed.  No complaints.   Objective: Vital signs in last 24 hours: Patient Vitals for the past 24 hrs:  BP Temp Temp src Pulse Resp SpO2 Weight  02/23/15 0506 (!) 130/59 mmHg 98 F (36.7 C) Oral 80 16 96 % 209 lb 7 oz (95 kg)  02/23/15 0000 (!) 147/60 mmHg - - 85 16 99 % -  02/22/15 2117 (!) 145/67 mmHg - - - - - -  02/22/15 1936 (!) 141/68 mmHg 98 F (36.7 C) Oral 84 18 98 % -  02/22/15 1454 (!) 148/63 mmHg 97.7 F (36.5 C) Oral 88 19 96 % -  02/22/15 1435 138/60 mmHg - - 91 - - -  02/22/15 1420 (!) 125/54 mmHg - - 90 - - -  02/22/15 1405 133/64 mmHg - - 90 - - -  02/22/15 1350 139/70 mmHg - - 93 - - -  02/22/15 1335 135/60 mmHg - - 96 - 98 % -  02/22/15 1329 (!) 149/67 mmHg - - 95 18 99 % -  02/22/15 1056 - - - 92 - - -  02/22/15 1055 (!) 129/53 mmHg - - - - - -   Current Weight  02/23/15 209 lb 7 oz (95 kg)  Pre op weight 91.6 kg   Intake/Output from previous day: 07/03 0701 - 07/04 0700 In: 1095 [P.O.:720] Out: 225 [Urine:225]  CBGs 171-150-152-135   PHYSICAL EXAM:  Heart: RRR Lungs: Clear Wound: Clean and dry Extremities: Mild RLE edema    Lab Results: CBC: Recent Labs  02/21/15 0212 02/22/15 0322  WBC 14.5* 13.2*  HGB 9.1* 8.8*  HCT 28.4* 27.2*  PLT 213 249   BMET:  Recent Labs  02/22/15 0322 02/23/15 0348  NA 135 135  K 4.2 4.4  CL 101 103  CO2 27 27  GLUCOSE 152* 152*  BUN 46* 47*  CREATININE 1.76* 1.64*  CALCIUM 9.1 9.3    PT/INR: No results for input(s): LABPROT, INR in the last 72 hours.    Assessment/Plan: S/P  Procedure(s) (LRB): CORONARY ARTERY BYPASS GRAFTING (CABG), ON PUMP, TIMES FIVE, USING LEFT INTERNAL MAMMARY ARTERY, RIGHT GREATER SAPHENOUS VEIN HARVESTED ENDOSCOPICALLY (N/A) TRANSESOPHAGEAL ECHOCARDIOGRAM (TEE) (N/A)  CV- SR, BPs stable, mildly elevated. Continue beta blocker, Clonidine. Hold on restarting ARB for now due to increased Cr.   Renal- Cr around baseline (1.7).  DM-CBGs stable, continue Levemir, SSI.  Expected postop blood loss anemia- H/H  Stable.  Vol overload- continue diuresis.  Disp-Hopefully to [facility] in am if bed available.   LOS: 17 days    COLLINS,GINA H 02/23/2015  Patient examined Walking the hallways with better strength He is ready to go to [facility] when bed available patient examined and medical record reviewed,agree with above note. Tharon Aquas Trigt III 02/23/2015

## 2015-02-23 NOTE — Care Management (Signed)
Important Message  Patient Details  Name: Zachary Mcmahon MRN: [id number] Date of Birth: 1931/09/08   Medicare Important Message Given:  Yes-third notification given    Loann Quill 02/23/2015, 9:35 AM

## 2015-02-23 NOTE — Clinical Social Work Note (Signed)
CSW spoke with patient who stated he would like to go to [facility] for his first choice, second choice, [facility] in [place], and third choice [facility].  CSW informed [facility] that patient may be ready tomorrow and was wanting to go to facility, left message for admissions to call CSW back.  Jones Broom. Burns, MSW, [place] 02/23/2015 12:52 PM

## 2015-02-23 NOTE — Progress Notes (Signed)
Pt ambulated in hall with front wheel walker, 350 feet without complaints.

## 2015-02-23 NOTE — Progress Notes (Signed)
Pt ambulated in hall with front wheel walker, 180 feet. Assisted into bed. SCD on left leg. Call bell and phone in reach.

## 2015-02-23 NOTE — Progress Notes (Signed)
Pt ambulated in hall with front wheel walker, slowly, 400 feet. He complains of pain in left lower leg/ankle with weight bearing that resolves with rest.

## 2015-02-23 NOTE — Progress Notes (Signed)
Chest tube sutures X2 dc'd without difficulty.

## 2015-02-24 DIAGNOSIS — E1140 Type 2 diabetes mellitus with diabetic neuropathy, unspecified: Secondary | ICD-10-CM | POA: Diagnosis not present

## 2015-02-24 DIAGNOSIS — N183 Chronic kidney disease, stage 3 (moderate): Secondary | ICD-10-CM | POA: Diagnosis not present

## 2015-02-24 DIAGNOSIS — M6281 Muscle weakness (generalized): Secondary | ICD-10-CM | POA: Diagnosis not present

## 2015-02-24 DIAGNOSIS — E119 Type 2 diabetes mellitus without complications: Secondary | ICD-10-CM | POA: Diagnosis not present

## 2015-02-24 DIAGNOSIS — I200 Unstable angina: Secondary | ICD-10-CM | POA: Diagnosis not present

## 2015-02-24 DIAGNOSIS — E1122 Type 2 diabetes mellitus with diabetic chronic kidney disease: Secondary | ICD-10-CM | POA: Diagnosis not present

## 2015-02-24 DIAGNOSIS — R197 Diarrhea, unspecified: Secondary | ICD-10-CM | POA: Diagnosis not present

## 2015-02-24 DIAGNOSIS — E1121 Type 2 diabetes mellitus with diabetic nephropathy: Secondary | ICD-10-CM | POA: Diagnosis not present

## 2015-02-24 DIAGNOSIS — M1990 Unspecified osteoarthritis, unspecified site: Secondary | ICD-10-CM | POA: Diagnosis not present

## 2015-02-24 DIAGNOSIS — I2510 Atherosclerotic heart disease of native coronary artery without angina pectoris: Secondary | ICD-10-CM | POA: Diagnosis not present

## 2015-02-24 DIAGNOSIS — I8393 Asymptomatic varicose veins of bilateral lower extremities: Secondary | ICD-10-CM | POA: Diagnosis not present

## 2015-02-24 DIAGNOSIS — Z8249 Family history of ischemic heart disease and other diseases of the circulatory system: Secondary | ICD-10-CM | POA: Diagnosis not present

## 2015-02-24 DIAGNOSIS — R0602 Shortness of breath: Secondary | ICD-10-CM | POA: Diagnosis not present

## 2015-02-24 DIAGNOSIS — R079 Chest pain, unspecified: Secondary | ICD-10-CM | POA: Diagnosis not present

## 2015-02-24 DIAGNOSIS — R600 Localized edema: Secondary | ICD-10-CM | POA: Diagnosis not present

## 2015-02-24 DIAGNOSIS — J90 Pleural effusion, not elsewhere classified: Secondary | ICD-10-CM | POA: Diagnosis not present

## 2015-02-24 DIAGNOSIS — E785 Hyperlipidemia, unspecified: Secondary | ICD-10-CM | POA: Diagnosis not present

## 2015-02-24 DIAGNOSIS — R2689 Other abnormalities of gait and mobility: Secondary | ICD-10-CM | POA: Diagnosis not present

## 2015-02-24 DIAGNOSIS — I2699 Other pulmonary embolism without acute cor pulmonale: Secondary | ICD-10-CM | POA: Diagnosis not present

## 2015-02-24 DIAGNOSIS — N182 Chronic kidney disease, stage 2 (mild): Secondary | ICD-10-CM | POA: Diagnosis not present

## 2015-02-24 DIAGNOSIS — K219 Gastro-esophageal reflux disease without esophagitis: Secondary | ICD-10-CM | POA: Diagnosis not present

## 2015-02-24 DIAGNOSIS — Z833 Family history of diabetes mellitus: Secondary | ICD-10-CM | POA: Diagnosis not present

## 2015-02-24 DIAGNOSIS — E1142 Type 2 diabetes mellitus with diabetic polyneuropathy: Secondary | ICD-10-CM | POA: Diagnosis not present

## 2015-02-24 DIAGNOSIS — I129 Hypertensive chronic kidney disease with stage 1 through stage 4 chronic kidney disease, or unspecified chronic kidney disease: Secondary | ICD-10-CM | POA: Diagnosis not present

## 2015-02-24 DIAGNOSIS — K567 Ileus, unspecified: Secondary | ICD-10-CM | POA: Diagnosis not present

## 2015-02-24 DIAGNOSIS — I10 Essential (primary) hypertension: Secondary | ICD-10-CM | POA: Diagnosis not present

## 2015-02-24 DIAGNOSIS — N184 Chronic kidney disease, stage 4 (severe): Secondary | ICD-10-CM | POA: Diagnosis not present

## 2015-02-24 DIAGNOSIS — Z85828 Personal history of other malignant neoplasm of skin: Secondary | ICD-10-CM | POA: Diagnosis not present

## 2015-02-24 DIAGNOSIS — Z48812 Encounter for surgical aftercare following surgery on the circulatory system: Secondary | ICD-10-CM | POA: Diagnosis not present

## 2015-02-24 DIAGNOSIS — E1151 Type 2 diabetes mellitus with diabetic peripheral angiopathy without gangrene: Secondary | ICD-10-CM | POA: Diagnosis not present

## 2015-02-24 DIAGNOSIS — Z9981 Dependence on supplemental oxygen: Secondary | ICD-10-CM | POA: Diagnosis not present

## 2015-02-24 DIAGNOSIS — Z951 Presence of aortocoronary bypass graft: Secondary | ICD-10-CM | POA: Diagnosis not present

## 2015-02-24 DIAGNOSIS — I214 Non-ST elevation (NSTEMI) myocardial infarction: Secondary | ICD-10-CM | POA: Diagnosis not present

## 2015-02-24 DIAGNOSIS — R278 Other lack of coordination: Secondary | ICD-10-CM | POA: Diagnosis not present

## 2015-02-24 DIAGNOSIS — R262 Difficulty in walking, not elsewhere classified: Secondary | ICD-10-CM | POA: Diagnosis not present

## 2015-02-24 LAB — GLUCOSE, CAPILLARY
GLUCOSE-CAPILLARY: 147 mg/dL — AB (ref 65–99)
Glucose-Capillary: 156 mg/dL — ABNORMAL HIGH (ref 65–99)

## 2015-02-24 MED ORDER — FUROSEMIDE 40 MG PO TABS: 40.0000 mg | ORAL_TABLET | Freq: Every day | ORAL | Status: DC

## 2015-02-24 MED ORDER — METOPROLOL TARTRATE 25 MG PO TABS: 25.0000 mg | ORAL_TABLET | Freq: Two times a day (BID) | ORAL | Status: DC

## 2015-02-24 MED ORDER — FERROUS SULFATE 325 (65 FE) MG PO TABS: 325.0000 mg | ORAL_TABLET | Freq: Every day | ORAL | Status: AC

## 2015-02-24 MED ORDER — POTASSIUM CHLORIDE CRYS ER 10 MEQ PO TBCR: 10.0000 meq | EXTENDED_RELEASE_TABLET | Freq: Every day | ORAL | Status: DC

## 2015-02-24 MED ORDER — TRAMADOL HCL 50 MG PO TABS: 50.0000 mg | ORAL_TABLET | Freq: Four times a day (QID) | ORAL | Status: DC | PRN

## 2015-02-24 NOTE — Progress Notes (Addendum)
      [address]       [place] [address]             [phone]        7 Days Post-Op Procedure(s) (LRB): CORONARY ARTERY BYPASS GRAFTING (CABG), ON PUMP, TIMES FIVE, USING LEFT INTERNAL MAMMARY ARTERY, RIGHT GREATER SAPHENOUS VEIN HARVESTED ENDOSCOPICALLY (N/A) TRANSESOPHAGEAL ECHOCARDIOGRAM (TEE) (N/A)  Subjective: Patient without complaints. He hopes to go to [facility] today.  Objective: Vital signs in last 24 hours: Temp:  [97.3 F (36.3 C)-97.7 F (36.5 C)] 97.7 F (36.5 C) (07/05 0550) Pulse Rate:  [73-89] 73 (07/05 0550) Cardiac Rhythm:  [-] Heart block (07/04 1930) Resp:  [16] 16 (07/05 0550) BP: (122-150)/(55-69) 122/55 mmHg (07/05 0550) SpO2:  [96 %-100 %] 98 % (07/05 0550) Weight:  [210 lb 8 oz (95.482 kg)] 210 lb 8 oz (95.482 kg) (07/05 0550)  Pre op weight 91.6 kg Current Weight  02/24/15 210 lb 8 oz (95.482 kg)      Intake/Output from previous day: 07/04 0701 - 07/05 0700 In: 240 [P.O.:240] Out: 1075 [Urine:1075]   Physical Exam:  Cardiovascular: RRR Pulmonary: Slightly diminished at bases R>L; no rales, wheezes, or rhonchi. Abdomen: Soft, non tender, bowel sounds present. Extremities: Mild bilateral lower extremity edema. Wounds: Clean and dry.  No erythema or signs of infection.  Lab Results: CBC:  Recent Labs  02/22/15 0322  WBC 13.2*  HGB 8.8*  HCT 27.2*  PLT 249   BMET:   Recent Labs  02/22/15 0322 02/23/15 0348  NA 135 135  K 4.2 4.4  CL 101 103  CO2 27 27  GLUCOSE 152* 152*  BUN 46* 47*  CREATININE 1.76* 1.64*  CALCIUM 9.1 9.3    PT/INR:  Lab Results  Component Value Date   INR 1.36 02/17/2015   INR 1.21 02/16/2015   INR 1.16 02/09/2015   ABG:  INR: Will add last result for INR, ABG once components are confirmed Will add last 4 CBG results once components are confirmed  Assessment/Plan:  1. CV - SR in the 80's this am.On Clonidine 0.2 mg bid, Lopressor 25 mg bid. Will not restart ARB  yet as creatinine still elevated. 2.  Pulmonary - On room air.  Encourage incentive spirometer. 3. Volume Overload - On Lasix 40 mg daily. Was on Lasix 40 mg bid prior to surgery. Will continue on daily Lasix. 4.  Acute blood loss anemia - H and H 8.8 and 27.2. Start Ferrous and folic acid. 5.Last creatinine was down to 1.64 6. DM-CBGs 156/162/147. On Insulin. Pre op HGA1C 7.5. Will need follow up with medical doctor after discharge. 7.To [facility] when bed available   ZIMMERMAN,DONIELLE MPA-C 02/24/2015,7:20 AM

## 2015-02-24 NOTE — Clinical Social Work Placement (Signed)
   CLINICAL SOCIAL WORK PLACEMENT  NOTE  Date:  02/24/2015  Patient Details  Name: Zachary Mcmahon MRN: [id number] Date of Birth: [birth year]  Clinical Social Work is seeking post-discharge placement for this patient at the [place] level of care (*CSW will initial, date and re-position this form in  chart as items are completed):  Yes   Patient/family provided with [place] Work Department's list of facilities offering this level of care within the geographic area requested by the patient (or if unable, by the patient's family).  Yes   Patient/family informed of their freedom to choose among providers that offer the needed level of care, that participate in Medicare, Medicaid or managed care program needed by the patient, have an available bed and are willing to accept the patient.  Yes   Patient/family informed of [facility]'s ownership interest in [facility] and [facility], as well as of the fact that they are under no obligation to receive care at these facilities.  PASRR submitted to EDS on 02/20/15     PASRR number received on       Existing PASRR number confirmed on 02/20/15     FL2 transmitted to all facilities in geographic area requested by pt/family on 02/20/15     FL2 transmitted to all facilities within larger geographic area on 02/20/15     Patient informed that his/her managed care company has contracts with or will negotiate with certain facilities, including the following:        Yes   Patient/family informed of bed offers received.  Patient chooses bed at [facility]     Physician recommends and patient chooses bed at      Patient to be transferred to [facility] on 02/24/15.  Patient to be transferred to facility by ptar     Patient family notified on 02/24/15 of transfer.  Name of family member notified:  pt to notify friend     PHYSICIAN Please sign FL2     Additional Comment:     _______________________________________________ Cranford Mon, LCSW 02/24/2015, 12:00 PM

## 2015-02-24 NOTE — Progress Notes (Signed)
Patient will discharge to [facility] Anticipated discharge date: 02/24/15 Family notified: pt to notify friend Transportation by [employer]- scheduled for 1pm  CSW signing off.  Domenica Reamer, [place] Social Worker [phone]

## 2015-02-24 NOTE — Progress Notes (Signed)
Attempted to call [facility] twice.  Both times phone answered by reception and transferred without being answered.  Unable to give report.  Will attempt again later. Payton Emerald, RN

## 2015-02-24 NOTE — Care Management Note (Signed)
Case Management Note  Patient Details  Name: Zachary Mcmahon MRN: [id number] Date of Birth: 04/03/1932  Subjective/Objective:            Pt post op CABG        Action/Plan: Plan for SNF for rehab- CSW following for placement needs.  Expected Discharge Date:       02/24/15           Expected Discharge Plan:  Skilled Nursing Facility  In-House Referral:  Clinical Social Work  Discharge planning Services  CM Consult  Post Acute Care Choice:    Choice offered to:     DME Arranged:    DME Agency:     HH Arranged:    [place] Agency:     Status of Service:  Completed, signed off  Medicare Important Message Given:  Yes-third notification given Date Medicare IM Given:  02/09/15 Medicare IM give by:  Jacqlyn Krauss, RN,BSN  Date Additional Medicare IM Given:   02/23/15 Additional Medicare Important Message give by:     If discussed at [place] of Stay Meetings, dates discussed:    Additional Comments:  Dawayne Patricia, RN 02/24/2015, 2:18 PM

## 2015-02-24 NOTE — Progress Notes (Signed)
CARDIAC REHAB PHASE I   PRE:  Rate/Rhythm: 78 SR  BP:  Sitting: 144/65        SaO2: 98 RA  MODE:  Ambulation: 150 ft   POST:  Rate/Rhythm: 101 ST  BP:  Sitting: 167/55         SaO2: 97 RA  Pt ambulated 150 ft on RA, rolling walker, standby assist (pt needed assistance to stand from recliner), steady gait, tolerated fair. Pt distance limited by neuropathy, pt c/o feeling "pins and needles" in his left foot. Pt denies CP, dizziness, DOE, declined rest stop. [facility] discharge education completed. Reviewed IS, sternal precautions, activity progression, exercise, risk factors, diet including heart healthy, carb counting, and sodium restrictions, daily weights, and phase 2 cardiac rehab. Pt verbalized understanding. Pt agrees to phase 2 cardiac rehab. Will send referral to [place]. Pt watching cardiac surgery discharge video, in recliner, call bell within reach.   [place]:[phone]   Lenna Sciara, RN, BSN 02/24/2015 9:42 AM

## 2015-02-24 NOTE — Progress Notes (Signed)
PT Cancellation Note  Patient Details Name: Lexander Zirk MRN: [id number] DOB: [birth year]   Cancelled Treatment:    Reason Eval/Treat Not Completed: Patient at procedure or test/unavailable. Working with Cardiac Rehab. Will reattempt as schedule permits.   Brier Reid 02/24/2015, 9:03 AM Pager [phone]

## 2015-02-25 ENCOUNTER — Non-Acute Institutional Stay (SKILLED_NURSING_FACILITY): Payer: Medicare Other | Admitting: Internal Medicine

## 2015-02-25 DIAGNOSIS — N182 Chronic kidney disease, stage 2 (mild): Secondary | ICD-10-CM | POA: Diagnosis not present

## 2015-02-25 DIAGNOSIS — E1151 Type 2 diabetes mellitus with diabetic peripheral angiopathy without gangrene: Secondary | ICD-10-CM

## 2015-02-25 DIAGNOSIS — E1121 Type 2 diabetes mellitus with diabetic nephropathy: Secondary | ICD-10-CM

## 2015-02-25 DIAGNOSIS — Z951 Presence of aortocoronary bypass graft: Secondary | ICD-10-CM | POA: Diagnosis not present

## 2015-02-25 NOTE — Progress Notes (Signed)
Patient ID: Zachary Mcmahon, male   DOB: [birth year], 81 y.o.   MRN: [id number]  Facility; [facility] Chief complaint; admission to SNF post admit to [facility] from 6/17 to 7/5   History; this is an 81 year old man who lives in need in with his wife. In fact he is her caregiver as she suffers from Alzheimer's disease. Originally seen at the [facility] with complaints of chest pain. He was given nitroglycerin in the emergency department with relief. EKG showed no ST changes but he rolled and with cardiac enzymes for a non-ST elevation MI. He was transferred to [facility]. He underwent cardiac catheterization which showed severe multivessel coronary artery disease but a preserved EF. Preparation was made for bypass surgery. However there are issues with regards to vein harvest. He had a duplex ultrasound that was negative for DVT however his left greater saphenous vein and its branches had reflux post phlebitic syndrome and superficial thrombophlebitis of the left saphenous vein. He then developed further chest pain. He required BiPAP nitroglycerin Lasix and lipase to low. He vomited spiked a temperature to 102. He required intubation, extubated on 6/23. He received IV anabiotic's and his fever and leukocytosis slowly resolved. He then went on to have his CABG on 02/17/15 this was a CABG 5. He had an endoscopic harvest of the greater saphenous vein from the right leg. Mild elevation of his creatinine postoperatively. He was tachycardic and hypertensive and his Lopressor was increased. His discharge creatinine was 1.64.  Past Medical History  Diagnosis Date  . Hypertension   . High potassium   . Varicose veins   . Hyperlipidemia   . Basal cell carcinoma of forehead 2016 X 2  . Basal cell carcinoma of left earlobe   . Old myocardial infarct     "sometime in the past; don't know when" (02/07/2015)  . Type II diabetes mellitus   . Pneumonia X 1  . GERD (gastroesophageal reflux disease)   . Arthritis      "legs" (02/07/2015)  . History of gout X 1  . Diabetic peripheral neuropathy     "left foot" (02/07/2015)   Past Surgical History  Procedure Laterality Date  . Knee arthroscopy Right ~ 2008  . Orif hip fracture  03/20/2012    Procedure: OPEN REDUCTION INTERNAL FIXATION HIP;  Surgeon: Sanjuana Kava, MD;  Location: [facility];  Service: Orthopedics;  Laterality: Right;  . Lower extremity venous doppler  07/11/2012    No evidence of DVT in the left lower extremity. Evidence of partially recanalized, chronic, non-obstructive thrombus in the left great SV and its branches consistent with significant reflux consistent with post-phlebitic syndrome. Significant reflux of the left short saphenous vein.  . Cardiovascular stress test  05/29/2012    Mild-moderate perfusion defect due to infarct/scar with mild-moderate perinfarct ischemia seen in the mid anterior, apical anterior, apical septal, and apical regions. No ECG changes. Global LV systolic function is severely reduced.  . Transthoracic echocardiogram  04/18/2012    EF 45%, mild-moderate LVH  . Transthoracic echocardiogram  04/18/12    EF% 45%.SEVERE HYPOKINESIS TO AKINESIS OF THE MID-DISTAL INFEROLATERAL MYOCARDIUM AND MUCH OF THE APEX.  Marland Kitchen Lexiscam myocardial perfusion  05/29/12    MARKED PERFUSION DEFECT DUE TO INFARC/SCAR WITH MILD PERINFARCT ISCHEMIA IN THE BASAL INFERIOR, MID INFEROSEPTAL, MID INFERIOR AND APICALINFERIOR REGION. EF%33%. PERIINFARCT ISCHEMIA IN THE MID ANTERIOR, APICAL ANTERIOR, APICAL SEPTAL AND APICAL REGIONS.  . Fracture surgery    . Cystoscopy w/ stone manipulation  X 1  . Basal cell carcinoma excision      "probably 1/2 dozen cut off face, left ear" (02/07/2015)  . Cardiac catheterization N/A 02/09/2015    Procedure: Left Heart Cath and Coronary Angiography;  Surgeon: Leonie Man, MD;  Location: [place] CV [facility];  Service: Cardiovascular;  Laterality: N/A;  . Coronary artery bypass graft N/A 02/17/2015    Procedure: CORONARY  ARTERY BYPASS GRAFTING (CABG), ON PUMP, TIMES FIVE, USING LEFT INTERNAL MAMMARY ARTERY, RIGHT GREATER SAPHENOUS VEIN HARVESTED ENDOSCOPICALLY;  Surgeon: [name], MD;  Location: [facility];  Service: Open Heart Surgery;  Laterality: N/A;  -LIMA to LAD -SVG to DIAGONAL - SEQ SVG to OM1 and PLB -SVG to PDA  . Tee without cardioversion N/A 02/17/2015    Procedure: TRANSESOPHAGEAL ECHOCARDIOGRAM (TEE);  Surgeon: [name], MD;  Location: [facility];  Service: Open Heart Surgery;  Laterality: N/A;    Current Outpatient Prescriptions on File Prior to Visit  Medication Sig Dispense Refill  . aspirin EC 81 MG tablet Take 81 mg by mouth every morning.    Marland Kitchen atorvastatin (LIPITOR) 20 MG tablet TAKE 1 TABLET BY MOUTH DAILY AT 6:00 P.M. ( MUST SCHEDULE AN APPOINTMENT FOR FUTURE REFILLS ) 30 tablet 0  . cloNIDine (CATAPRES) 0.2 MG tablet Take 0.2 mg by mouth 2 (two) times daily.    . ferrous sulfate 325 (65 FE) MG tablet Take 1 tablet (325 mg total) by mouth daily with breakfast.  3  . furosemide (LASIX) 40 MG tablet Take 1 tablet (40 mg total) by mouth daily. 30 tablet   . gabapentin (NEURONTIN) 100 MG capsule Take 100 mg by mouth 3 (three) times daily.     Marland Kitchen LEVEMIR FLEXTOUCH 100 UNIT/ML Pen Inject 36 Units into the skin at bedtime.     . metoprolol tartrate (LOPRESSOR) 25 MG tablet Take 1 tablet (25 mg total) by mouth 2 (two) times daily.    Marland Kitchen omeprazole (PRILOSEC) 20 MG capsule Take 20 mg by mouth daily.     . potassium chloride SA (K-DUR,KLOR-CON) 10 MEQ tablet Take 1 tablet (10 mEq total) by mouth daily.    . traMADol (ULTRAM) 50 MG tablet Take 1 tablet (50 mg total) by mouth every 6 (six) hours as needed for moderate pain. 30 tablet 0    Social; patient tells me he lives in [place] with his wife. She has Alzheimer's disease there is a caregiver in the home. I think he was independent with ADLs and IDL's. He was giving his own insulin Levemir 36 units [same dose is currently] History   Social  History  . Marital Status: Married    Spouse Name: N/A  . Number of Children: N/A  . Years of Education: N/A   Occupational History  . Not on file.   Social History Main Topics  . Smoking status: Never Smoker   . Smokeless tobacco: Never Used  . Alcohol Use: No  . Drug Use: No  . Sexual Activity: Not Currently   Other Topics Concern  . Not on file    Family History  Problem Relation Age of Onset  . Deep vein thrombosis Mother   . Hypertension Mother   . Hypertension Sister   . Diabetes Brother   . Hyperlipidemia Brother   . Hypertension Brother   . Heart attack Brother     Review of systems Gen. patient states he feels fairly well Respiratory no cough no shortness of breath Cardiac postoperative pain however  he is been up walking with out exertional chest symptoms GI states he was having some diarrhea in the hospital that resolved when a laxity of was stopped Extremities; states he has right leg pain. Numbness in his feet  Physical examination Gen. patient is not in any distress Respiratory clear entry bilaterally Cardiac; heart sounds are normal there is no S3 no S4 no murmurs his JVP is elevated at 45. 2+ sacral edema mild lower extremity pedal edema although some of the latter may be due to a superficial thrombophlebitis Abdomen; no liver no spleen no tenderness slight umbilicus hernia GU no bladder distention no CVA tenderness Extremities slight venous stasis of physiology. I suspect he does have superficial thrombophlebitis there is tenderness along the medial aspect of the left leg. There is no evidence of a DVT Circulation; peripheral pulses are reduced in his feet Neurologic; somewhat bradykinetic in his movements although he had no rigidity or tremor. He needs assistance to come to the sitting position Mental status I see no abnormalities here  Impression/plan #1 coronary artery disease status post CABG 5. His chest incision is well-healed. #2 history of  congestive heart failure volume expanded state currently although mildly. He is on Lasix 40 probably increase this slightly. #3 type 2 diabetes with probable neuropathy macrovascular disease, and neuropathy he is on Levemir 36 units at bedtime . He will need follow-up CBGs. #4 diabetic neuropathic pain on Neurontin. #5 documented superficial thrombophlebitis; this would raise the question of anticoagulation #6hyperlipidemia on Lipitor or this could be secondary prophylaxis  Careful monitoring of his weights, BUN and creatinine.   Patient:    Zachary Mcmahon, Zachary Mcmahon MR #:       [id number] Study Date: 02/11/2015 Gender:     M Age:        11 Height:     175.3 cm Weight:     93.9 kg BSA:        2.16 m^2 Pt. Status: Room:       [facility]   Ivar Bury, MD  [place], MD  ATTENDING    [place], [place], [place], Inpatient  SONOGRAPHER  Roseanna Rainbow  cc:  ------------------------------------------------------------------- LV EF: 45% -   50%  ------------------------------------------------------------------- Indications:      Cardiomyopathy - ischemic 414.8.  ------------------------------------------------------------------- History:   PMH:  Limited echo to assess wall motion. Study prior to CABG. CKD. NSTEMI.  Coronary artery disease.  Angina pectoris. Risk factors:  Hypertension. Diabetes mellitus. Dyslipidemia.  ------------------------------------------------------------------- Study Conclusions  - Left ventricle: Infeior and septal hypokinesis The cavity size   was mildly dilated. Wall thickness was increased in a pattern of   moderate LVH. Systolic function was mildly reduced. The estimated   ejection fraction was in the range of 45% to 50%. - Mitral valve: There was mild regurgitation. - Atrial septum: Non mobile atrial septal aneurysm No obvious PFO   seen but not well interrogated - Pulmonary arteries: PA  peak pressure: 51 mm Hg (S).  Transthoracic echocardiography.  M-mode, limited 2D, limited spectral Doppler, and color Doppler.  Birthdate:  Patient birthdate: 02-14-1932.  Age:  Patient is 81 yr old.  Sex:  Gender: male.    BMI: 30.6 kg/m^2.  Blood pressure:     133/45  Patient status:  Inpatient.  Study date:  Study date: 02/11/2015. Study time: 03:15 PM.  Location:  ICU/CCU  -------------------------------------------------------------------  -------------------------------------------------------------------  Left ventricle:  Infeior and septal hypokinesis The cavity size was mildly dilated. Wall thickness was increased in a pattern of moderate LVH. Systolic function was mildly reduced. The estimated ejection fraction was in the range of 45% to 50%.  ------------------------------------------------------------------- Aortic valve:   Mildly thickened leaflets.  Doppler:   There was no stenosis.  ------------------------------------------------------------------- Mitral valve:   Mildly thickened leaflets .  Doppler:  There was mild regurgitation.  ------------------------------------------------------------------- Left atrium:  The atrium was normal in size.  ------------------------------------------------------------------- Atrial septum:  Non mobile atrial septal aneurysm No obvious PFO seen but not well interrogated  ------------------------------------------------------------------- Right ventricle:  The cavity size was normal. Wall thickness was normal. Systolic function was normal.  ------------------------------------------------------------------- Pulmonic valve:    Doppler:  There was trivial regurgitation.  ------------------------------------------------------------------- Tricuspid valve:   Doppler:  There was mild regurgitation.  ------------------------------------------------------------------- Right atrium:  The atrium was normal in  size.  ------------------------------------------------------------------- Pericardium:  The pericardium was normal in appearance.  ------------------------------------------------------------------- Measurements   CBC Latest Ref Rng 02/22/2015 02/21/2015 02/20/2015  WBC 4.0 - 10.5 K/uL 13.2(H) 14.5(H) 15.3(H)  Hemoglobin 13.0 - 17.0 g/dL 8.8(L) 9.1(L) 9.1(L)  Hematocrit 39.0 - 52.0 % 27.2(L) 28.4(L) 28.9(L)  Platelets 150 - 400 K/uL 249 213 186    Lab Results  Component Value Date   CREATININE 1.64* 02/23/2015   CREATININE 1.76* 02/22/2015   CREATININE 1.81* 02/21/2015

## 2015-03-02 ENCOUNTER — Encounter (HOSPITAL_COMMUNITY)
Admission: RE | Admit: 2015-03-02 | Discharge: 2015-03-02 | Disposition: A | Discharge status: Home / Self Care | Payer: Medicare Other | Source: Skilled Nursing Facility | Attending: Internal Medicine | Admitting: Internal Medicine

## 2015-03-02 ENCOUNTER — Non-Acute Institutional Stay (SKILLED_NURSING_FACILITY): Payer: Medicare Other | Admitting: Internal Medicine

## 2015-03-02 ENCOUNTER — Emergency Department (HOSPITAL_COMMUNITY): Payer: Medicare Other

## 2015-03-02 ENCOUNTER — Inpatient Hospital Stay (HOSPITAL_COMMUNITY)
Admission: EM | Admit: 2015-03-02 | Discharge: 2015-03-05 | DRG: 175 | Disposition: A | Discharge status: Skilled Nursing Facility | Payer: Medicare Other | Attending: Internal Medicine | Admitting: Internal Medicine

## 2015-03-02 ENCOUNTER — Encounter (HOSPITAL_COMMUNITY): Payer: Self-pay

## 2015-03-02 ENCOUNTER — Other Ambulatory Visit: Payer: Self-pay

## 2015-03-02 ENCOUNTER — Other Ambulatory Visit: Payer: Self-pay | Admitting: Internal Medicine

## 2015-03-02 DIAGNOSIS — I2699 Other pulmonary embolism without acute cor pulmonale: Secondary | ICD-10-CM | POA: Diagnosis not present

## 2015-03-02 DIAGNOSIS — I214 Non-ST elevation (NSTEMI) myocardial infarction: Secondary | ICD-10-CM | POA: Diagnosis present

## 2015-03-02 DIAGNOSIS — I200 Unstable angina: Secondary | ICD-10-CM | POA: Diagnosis not present

## 2015-03-02 DIAGNOSIS — Z951 Presence of aortocoronary bypass graft: Secondary | ICD-10-CM | POA: Diagnosis not present

## 2015-03-02 DIAGNOSIS — J90 Pleural effusion, not elsewhere classified: Secondary | ICD-10-CM | POA: Diagnosis present

## 2015-03-02 DIAGNOSIS — Z833 Family history of diabetes mellitus: Secondary | ICD-10-CM | POA: Diagnosis not present

## 2015-03-02 DIAGNOSIS — R079 Chest pain, unspecified: Secondary | ICD-10-CM

## 2015-03-02 DIAGNOSIS — I129 Hypertensive chronic kidney disease with stage 1 through stage 4 chronic kidney disease, or unspecified chronic kidney disease: Secondary | ICD-10-CM | POA: Diagnosis present

## 2015-03-02 DIAGNOSIS — K219 Gastro-esophageal reflux disease without esophagitis: Secondary | ICD-10-CM | POA: Diagnosis present

## 2015-03-02 DIAGNOSIS — E1142 Type 2 diabetes mellitus with diabetic polyneuropathy: Secondary | ICD-10-CM | POA: Diagnosis present

## 2015-03-02 DIAGNOSIS — Z85828 Personal history of other malignant neoplasm of skin: Secondary | ICD-10-CM | POA: Diagnosis not present

## 2015-03-02 DIAGNOSIS — R2689 Other abnormalities of gait and mobility: Secondary | ICD-10-CM | POA: Diagnosis not present

## 2015-03-02 DIAGNOSIS — I10 Essential (primary) hypertension: Secondary | ICD-10-CM | POA: Diagnosis not present

## 2015-03-02 DIAGNOSIS — I517 Cardiomegaly: Secondary | ICD-10-CM | POA: Diagnosis not present

## 2015-03-02 DIAGNOSIS — E1122 Type 2 diabetes mellitus with diabetic chronic kidney disease: Secondary | ICD-10-CM | POA: Diagnosis present

## 2015-03-02 DIAGNOSIS — N184 Chronic kidney disease, stage 4 (severe): Secondary | ICD-10-CM | POA: Diagnosis not present

## 2015-03-02 DIAGNOSIS — Z9889 Other specified postprocedural states: Secondary | ICD-10-CM

## 2015-03-02 DIAGNOSIS — E785 Hyperlipidemia, unspecified: Secondary | ICD-10-CM | POA: Diagnosis present

## 2015-03-02 DIAGNOSIS — Z8249 Family history of ischemic heart disease and other diseases of the circulatory system: Secondary | ICD-10-CM | POA: Diagnosis not present

## 2015-03-02 DIAGNOSIS — N183 Chronic kidney disease, stage 3 (moderate): Secondary | ICD-10-CM

## 2015-03-02 DIAGNOSIS — E119 Type 2 diabetes mellitus without complications: Secondary | ICD-10-CM | POA: Diagnosis not present

## 2015-03-02 DIAGNOSIS — I82409 Acute embolism and thrombosis of unspecified deep veins of unspecified lower extremity: Secondary | ICD-10-CM

## 2015-03-02 DIAGNOSIS — R0602 Shortness of breath: Secondary | ICD-10-CM | POA: Diagnosis not present

## 2015-03-02 DIAGNOSIS — R600 Localized edema: Secondary | ICD-10-CM | POA: Diagnosis not present

## 2015-03-02 DIAGNOSIS — E1140 Type 2 diabetes mellitus with diabetic neuropathy, unspecified: Secondary | ICD-10-CM | POA: Diagnosis not present

## 2015-03-02 DIAGNOSIS — I2510 Atherosclerotic heart disease of native coronary artery without angina pectoris: Secondary | ICD-10-CM | POA: Diagnosis present

## 2015-03-02 DIAGNOSIS — M1990 Unspecified osteoarthritis, unspecified site: Secondary | ICD-10-CM | POA: Diagnosis not present

## 2015-03-02 HISTORY — DX: Respiratory failure, unspecified, unspecified whether with hypoxia or hypercapnia: J96.90

## 2015-03-02 HISTORY — DX: Pain in left lower leg: M79.662

## 2015-03-02 HISTORY — DX: Other specified soft tissue disorders: M79.89

## 2015-03-02 LAB — CBC WITH DIFFERENTIAL/PLATELET
BASOS ABS: 0.1 10*3/uL (ref 0.0–0.1)
BASOS PCT: 1 % (ref 0–1)
Eosinophils Absolute: 0.5 10*3/uL (ref 0.0–0.7)
Eosinophils Relative: 5 % (ref 0–5)
HCT: 30.8 % — ABNORMAL LOW (ref 39.0–52.0)
Hemoglobin: 10 g/dL — ABNORMAL LOW (ref 13.0–17.0)
Lymphocytes Relative: 10 % — ABNORMAL LOW (ref 12–46)
Lymphs Abs: 1 10*3/uL (ref 0.7–4.0)
MCH: 30 pg (ref 26.0–34.0)
MCHC: 32.5 g/dL (ref 30.0–36.0)
MCV: 92.5 fL (ref 78.0–100.0)
MONOS PCT: 9 % (ref 3–12)
Monocytes Absolute: 0.9 10*3/uL (ref 0.1–1.0)
NEUTROS PCT: 75 % (ref 43–77)
Neutro Abs: 7.4 10*3/uL (ref 1.7–7.7)
PLATELETS: 345 10*3/uL (ref 150–400)
RBC: 3.33 MIL/uL — ABNORMAL LOW (ref 4.22–5.81)
RDW: 14.1 % (ref 11.5–15.5)
WBC: 9.9 10*3/uL (ref 4.0–10.5)

## 2015-03-02 LAB — BASIC METABOLIC PANEL
ANION GAP: 7 (ref 5–15)
Anion gap: 8 (ref 5–15)
BUN: 36 mg/dL — ABNORMAL HIGH (ref 6–20)
BUN: 38 mg/dL — ABNORMAL HIGH (ref 6–20)
CHLORIDE: 105 mmol/L (ref 101–111)
CHLORIDE: 103 mmol/L (ref 101–111)
CO2: 29 mmol/L (ref 22–32)
CO2: 30 mmol/L (ref 22–32)
Calcium: 9.9 mg/dL (ref 8.9–10.3)
Calcium: 9.6 mg/dL (ref 8.9–10.3)
Creatinine, Ser: 1.53 mg/dL — ABNORMAL HIGH (ref 0.61–1.24)
Creatinine, Ser: 1.53 mg/dL — ABNORMAL HIGH (ref 0.61–1.24)
GFR calc Af Amer: 48 mL/min — ABNORMAL LOW (ref >60)
GFR, EST AFRICAN AMERICAN: 48 mL/min — AB (ref >60)
GFR, EST NON AFRICAN AMERICAN: 41 mL/min — AB (ref >60)
GFR, EST NON AFRICAN AMERICAN: 41 mL/min — AB (ref >60)
GLUCOSE: 52 mg/dL — AB (ref 65–99)
Glucose, Bld: 157 mg/dL — ABNORMAL HIGH (ref 65–99)
POTASSIUM: 5.2 mmol/L — AB (ref 3.5–5.1)
Potassium: 4.4 mmol/L (ref 3.5–5.1)
Sodium: 142 mmol/L (ref 135–145)
Sodium: 140 mmol/L (ref 135–145)

## 2015-03-02 LAB — CBC
HEMATOCRIT: 31.1 % — AB (ref 39.0–52.0)
HEMOGLOBIN: 9.7 g/dL — AB (ref 13.0–17.0)
MCH: 29 pg (ref 26.0–34.0)
MCHC: 31.2 g/dL (ref 30.0–36.0)
MCV: 93.1 fL (ref 78.0–100.0)
Platelets: 359 10*3/uL (ref 150–400)
RBC: 3.34 MIL/uL — ABNORMAL LOW (ref 4.22–5.81)
RDW: 13.9 % (ref 11.5–15.5)
WBC: 9 10*3/uL (ref 4.0–10.5)

## 2015-03-02 LAB — TROPONIN I
TROPONIN I: 0.1 ng/mL — AB (ref <0.031)
TROPONIN I: 0.11 ng/mL — AB (ref <0.031)
Troponin I: 0.09 ng/mL — ABNORMAL HIGH (ref <0.031)
Troponin I: 0.09 ng/mL — ABNORMAL HIGH (ref <0.031)

## 2015-03-02 LAB — GLUCOSE, CAPILLARY: Glucose-Capillary: 187 mg/dL — ABNORMAL HIGH (ref 65–99)

## 2015-03-02 LAB — PROTIME-INR
INR: 1.23 (ref 0.00–1.49)
Prothrombin Time: 15.6 seconds — ABNORMAL HIGH (ref 11.6–15.2)

## 2015-03-02 MED ORDER — ENOXAPARIN SODIUM 40 MG/0.4ML ~~LOC~~ SOLN: 40.0000 mg | SUBCUTANEOUS | Status: DC

## 2015-03-02 MED ORDER — HEPARIN (PORCINE) IN NACL 100-0.45 UNIT/ML-% IJ SOLN
1550.0000 [IU]/h | INTRAMUSCULAR | Status: DC
  Administered 2015-03-02 – 2015-03-03 (×2): 1400 [IU]/h via INTRAVENOUS
  Administered 2015-03-04: 1550 [IU]/h via INTRAVENOUS
  Filled 2015-03-02 (×3): qty 250

## 2015-03-02 MED ORDER — SODIUM CHLORIDE 0.9 % IV SOLN
INTRAVENOUS | Status: AC
  Administered 2015-03-02: 18:00:00 via INTRAVENOUS

## 2015-03-02 MED ORDER — METOPROLOL TARTRATE 25 MG PO TABS
25.0000 mg | ORAL_TABLET | Freq: Two times a day (BID) | ORAL | Status: DC
  Administered 2015-03-02 – 2015-03-05 (×6): 25 mg via ORAL
  Filled 2015-03-02 (×6): qty 1

## 2015-03-02 MED ORDER — TRAMADOL HCL 50 MG PO TABS: 50.0000 mg | ORAL_TABLET | Freq: Four times a day (QID) | ORAL | Status: DC | PRN

## 2015-03-02 MED ORDER — ACETAMINOPHEN 325 MG PO TABS: 650.0000 mg | ORAL_TABLET | ORAL | Status: DC | PRN

## 2015-03-02 MED ORDER — FUROSEMIDE 40 MG PO TABS
40.0000 mg | ORAL_TABLET | Freq: Every day | ORAL | Status: DC
  Administered 2015-03-03 – 2015-03-05 (×3): 40 mg via ORAL
  Filled 2015-03-02 (×3): qty 1

## 2015-03-02 MED ORDER — GABAPENTIN 100 MG PO CAPS
100.0000 mg | ORAL_CAPSULE | Freq: Three times a day (TID) | ORAL | Status: DC
  Administered 2015-03-02 – 2015-03-05 (×8): 100 mg via ORAL
  Filled 2015-03-02 (×8): qty 1

## 2015-03-02 MED ORDER — LORAZEPAM 0.5 MG PO TABS
0.5000 mg | ORAL_TABLET | Freq: Once | ORAL | Status: AC
  Administered 2015-03-02: 0.5 mg via ORAL
  Filled 2015-03-02: qty 1

## 2015-03-02 MED ORDER — HEPARIN BOLUS VIA INFUSION
4000.0000 [IU] | Freq: Once | INTRAVENOUS | Status: AC
  Administered 2015-03-02: 4000 [IU] via INTRAVENOUS

## 2015-03-02 MED ORDER — PANTOPRAZOLE SODIUM 40 MG PO TBEC
40.0000 mg | DELAYED_RELEASE_TABLET | Freq: Every day | ORAL | Status: DC
  Administered 2015-03-02 – 2015-03-05 (×4): 40 mg via ORAL
  Filled 2015-03-02 (×4): qty 1

## 2015-03-02 MED ORDER — ATORVASTATIN CALCIUM 20 MG PO TABS
20.0000 mg | ORAL_TABLET | Freq: Every day | ORAL | Status: DC
  Administered 2015-03-03: 20 mg via ORAL
  Filled 2015-03-02 (×3): qty 1

## 2015-03-02 MED ORDER — CLONIDINE HCL 0.2 MG PO TABS
0.2000 mg | ORAL_TABLET | Freq: Two times a day (BID) | ORAL | Status: DC
  Administered 2015-03-02 – 2015-03-05 (×6): 0.2 mg via ORAL
  Filled 2015-03-02 (×6): qty 1

## 2015-03-02 MED ORDER — POTASSIUM CHLORIDE CRYS ER 10 MEQ PO TBCR
10.0000 meq | EXTENDED_RELEASE_TABLET | Freq: Every day | ORAL | Status: DC
  Administered 2015-03-02 – 2015-03-05 (×4): 10 meq via ORAL
  Filled 2015-03-02 (×4): qty 1

## 2015-03-02 MED ORDER — ONDANSETRON HCL 4 MG/2ML IJ SOLN: 4.0000 mg | Freq: Four times a day (QID) | INTRAMUSCULAR | Status: DC | PRN

## 2015-03-02 MED ORDER — IOHEXOL 350 MG/ML SOLN
100.0000 mL | Freq: Once | INTRAVENOUS | Status: AC | PRN
  Administered 2015-03-02: 100 mL via INTRAVENOUS

## 2015-03-02 MED ORDER — INSULIN ASPART 100 UNIT/ML ~~LOC~~ SOLN: 0.0000 [IU] | Freq: Every day | SUBCUTANEOUS | Status: DC

## 2015-03-02 MED ORDER — INSULIN ASPART 100 UNIT/ML ~~LOC~~ SOLN
0.0000 [IU] | Freq: Three times a day (TID) | SUBCUTANEOUS | Status: DC
Start: 2015-03-03 — End: 2015-03-05
  Administered 2015-03-04 (×2): 2 [IU] via SUBCUTANEOUS

## 2015-03-02 MED ORDER — ASPIRIN EC 81 MG PO TBEC
81.0000 mg | DELAYED_RELEASE_TABLET | Freq: Every morning | ORAL | Status: DC
  Administered 2015-03-03 – 2015-03-05 (×3): 81 mg via ORAL
  Filled 2015-03-02 (×3): qty 1

## 2015-03-02 MED ORDER — FERROUS SULFATE 325 (65 FE) MG PO TABS
325.0000 mg | ORAL_TABLET | Freq: Every day | ORAL | Status: DC
  Administered 2015-03-03 – 2015-03-05 (×3): 325 mg via ORAL
  Filled 2015-03-02 (×5): qty 1

## 2015-03-02 MED ORDER — INSULIN DETEMIR 100 UNIT/ML ~~LOC~~ SOLN
36.0000 [IU] | Freq: Every day | SUBCUTANEOUS | Status: DC
  Administered 2015-03-02: 36 [IU] via SUBCUTANEOUS
  Filled 2015-03-02 (×2): qty 0.36

## 2015-03-02 NOTE — ED Provider Notes (Signed)
CSN: [id number]     Arrival date & time 03/02/15  1412 History   First MD Initiated Contact with Patient 03/02/15 1416     Chief Complaint  Patient presents with  . Chest Pain      HPI Pt was seen at 1420. Per EMS, NH report and pt, c/o gradual onset and resolution of one episode of chest "pain" that occurred PTA. Pt states he was walking in physical therapy when he developed "sharp" "constant" left sided chest pain. Was associated with diaphoresis and lightheadedness. Pt states he sat down "and they had me use just my legs to exercise," but his symptoms persisted. Pt was then brought back to his room at the NH, were his symptoms gradually improved while he rested. Pt states his symptoms lasted approximately 40 min before resolving. Denies symptoms currently. Denies palpitations, no SOB/cough, no abd pain, no N/V/D, no back pain.    Past Medical History  Diagnosis Date  . Hypertension   . High potassium   . Varicose veins   . Hyperlipidemia   . Basal cell carcinoma of forehead 2016 X 2  . Basal cell carcinoma of left earlobe   . Old myocardial infarct     "sometime in the past; don't know when" (02/07/2015)  . Type II diabetes mellitus   . Pneumonia X 1  . GERD (gastroesophageal reflux disease)   . Arthritis     "legs" (02/07/2015)  . History of gout X 1  . Diabetic peripheral neuropathy     "left foot" (02/07/2015)  . Respiratory failure   . Pain and swelling of left lower leg     chronic   Past Surgical History  Procedure Laterality Date  . Knee arthroscopy Right ~ 2008  . Orif hip fracture  03/20/2012    Procedure: OPEN REDUCTION INTERNAL FIXATION HIP;  Surgeon: Sanjuana Kava, MD;  Location: [facility];  Service: Orthopedics;  Laterality: Right;  . Lower extremity venous doppler  07/11/2012    No evidence of DVT in the left lower extremity. Evidence of partially recanalized, chronic, non-obstructive thrombus in the left great SV and its branches consistent with significant reflux  consistent with post-phlebitic syndrome. Significant reflux of the left short saphenous vein.  . Cardiovascular stress test  05/29/2012    Mild-moderate perfusion defect due to infarct/scar with mild-moderate perinfarct ischemia seen in the mid anterior, apical anterior, apical septal, and apical regions. No ECG changes. Global LV systolic function is severely reduced.  . Transthoracic echocardiogram  04/18/2012    EF 45%, mild-moderate LVH  . Transthoracic echocardiogram  04/18/12    EF% 45%.SEVERE HYPOKINESIS TO AKINESIS OF THE MID-DISTAL INFEROLATERAL MYOCARDIUM AND MUCH OF THE APEX.  Marland Kitchen Lexiscam myocardial perfusion  05/29/12    MARKED PERFUSION DEFECT DUE TO INFARC/SCAR WITH MILD PERINFARCT ISCHEMIA IN THE BASAL INFERIOR, MID INFEROSEPTAL, MID INFERIOR AND APICALINFERIOR REGION. EF%33%. PERIINFARCT ISCHEMIA IN THE MID ANTERIOR, APICAL ANTERIOR, APICAL SEPTAL AND APICAL REGIONS.  . Fracture surgery    . Cystoscopy w/ stone manipulation  X 1  . Basal cell carcinoma excision      "probably 1/2 dozen cut off face, left ear" (02/07/2015)  . Cardiac catheterization N/A 02/09/2015    Procedure: Left Heart Cath and Coronary Angiography;  Surgeon: Leonie Man, MD;  Location: [facility];  Service: Cardiovascular;  Laterality: N/A;  . Coronary artery bypass graft N/A 02/17/2015    Procedure: CORONARY ARTERY BYPASS GRAFTING (CABG), ON PUMP, TIMES FIVE, USING LEFT INTERNAL  MAMMARY ARTERY, RIGHT GREATER SAPHENOUS VEIN HARVESTED ENDOSCOPICALLY;  Surgeon: [name], MD;  Location: [facility];  Service: Open Heart Surgery;  Laterality: N/A;  -LIMA to LAD -SVG to DIAGONAL - SEQ SVG to OM1 and PLB -SVG to PDA  . Tee without cardioversion N/A 02/17/2015    Procedure: TRANSESOPHAGEAL ECHOCARDIOGRAM (TEE);  Surgeon: [name], MD;  Location: [place] [facility];  Service: Open Heart Surgery;  Laterality: N/A;  . Open heart sx  02/17/15   Family History  Problem Relation Age of Onset  . Deep vein thrombosis  Mother   . Hypertension Mother   . Hypertension Sister   . Diabetes Brother   . Hyperlipidemia Brother   . Hypertension Brother   . Heart attack Brother    History  Substance Use Topics  . Smoking status: Never Smoker   . Smokeless tobacco: Never Used  . Alcohol Use: No    Review of Systems ROS: Statement: All systems negative except as marked or noted in the HPI; Constitutional: Negative for fever and chills. ; ; Eyes: Negative for eye pain, redness and discharge. ; ; ENMT: Negative for ear pain, hoarseness, nasal congestion, sinus pressure and sore throat. ; ; Cardiovascular: +CP, diaphoresis. Negative for palpitations, dyspnea and peripheral edema. ; ; Respiratory: Negative for cough, wheezing and stridor. ; ; Gastrointestinal: Negative for nausea, vomiting, diarrhea, abdominal pain, blood in stool, hematemesis, jaundice and rectal bleeding. . ; ; Genitourinary: Negative for dysuria, flank pain and hematuria. ; ; Musculoskeletal: Negative for back pain and neck pain. Negative for swelling and trauma.; ; Skin: Negative for pruritus, rash, abrasions, blisters, bruising and skin lesion.; ; Neuro: +lightheadedness. Negative for headache and neck stiffness. Negative for weakness, altered level of consciousness , altered mental status, extremity weakness, paresthesias, involuntary movement, seizure and syncope.      Allergies  Bactrim  Home Medications   Prior to Admission medications   Medication Sig Start Date End Date Taking? Authorizing Provider  aspirin EC 81 MG tablet Take 81 mg by mouth every morning.    Historical Provider, MD  atorvastatin (LIPITOR) 20 MG tablet TAKE 1 TABLET BY MOUTH DAILY AT 6:00 P.M. ( MUST SCHEDULE AN APPOINTMENT FOR FUTURE REFILLS ) 03/27/14   Lorretta Harp, MD  cloNIDine (CATAPRES) 0.2 MG tablet Take 0.2 mg by mouth 2 (two) times daily. 01/20/15   Historical Provider, MD  ferrous sulfate 325 (65 FE) MG tablet Take 1 tablet (325 mg total) by mouth daily with  breakfast. 02/24/15   Donielle Liston Alba, PA-C  furosemide (LASIX) 40 MG tablet Take 1 tablet (40 mg total) by mouth daily. 02/24/15   Donielle Liston Alba, PA-C  gabapentin (NEURONTIN) 100 MG capsule Take 100 mg by mouth 3 (three) times daily.  04/01/14   Historical Provider, MD  LEVEMIR FLEXTOUCH 100 UNIT/ML Pen Inject 36 Units into the skin at bedtime.  01/20/15   Historical Provider, MD  metoprolol tartrate (LOPRESSOR) 25 MG tablet Take 1 tablet (25 mg total) by mouth 2 (two) times daily. 02/24/15   Donielle Liston Alba, PA-C  omeprazole (PRILOSEC) 20 MG capsule Take 20 mg by mouth daily.  04/02/14   Historical Provider, MD  potassium chloride SA (K-DUR,KLOR-CON) 10 MEQ tablet Take 1 tablet (10 mEq total) by mouth daily. 02/24/15   Donielle Liston Alba, PA-C  traMADol (ULTRAM) 50 MG tablet Take 1 tablet (50 mg total) by mouth every 6 (six) hours as needed for moderate pain. 02/24/15   Donielle Liston Alba,  PA-C   BP 159/79 mmHg  Pulse 91  Temp(Src) 98 F (36.7 C) (Oral)  Resp 18  Ht 5\' 9"  (1.753 m)  Wt 211 lb (95.709 kg)  BMI 31.15 kg/m2  SpO2 93% Physical Exam  1425: Physical examination:  Nursing notes reviewed; Vital signs and O2 SAT reviewed;  Constitutional: Well developed, Well nourished, Well hydrated, In no acute distress; Head:  Normocephalic, atraumatic; Eyes: EOMI, PERRL, No scleral icterus; ENMT: Mouth and pharynx normal, Mucous membranes moist; Neck: Supple, Full range of motion, No lymphadenopathy; Cardiovascular: Regular rate and rhythm, No gallop; Respiratory: Breath sounds coarse & equal bilaterally, No wheezes.  Speaking full sentences with ease, Normal respiratory effort/excursion; Chest: +healing mid-sternal surgical wound without erythema, ecchymosis, drainage. No deformity, Movement normal; Abdomen: Soft, Nontender, Nondistended, Normal bowel sounds; Genitourinary: No CVA tenderness; Extremities: Pulses normal, No tenderness, +1 RLE edema, +2 LLE edema with mild erythema/chronic  stasis changes to medial lower leg area and calf asymmetry (pt states this is his baseline). +healing surgical wound right mid-inner thigh without drainage, erythema, ecchymosis.; Neuro: AA&Ox3, Major CN grossly intact.  Speech clear. No gross focal motor deficits in extremities.; Skin: Color normal, Warm, Dry.   ED Course  Procedures      EKG Interpretation   Date/Time:  Monday March 02 2015 14:12:28 EDT Ventricular Rate:  91 PR Interval:  200 QRS Duration: 82 QT Interval:  361 QTC Calculation: 444 R Axis:   84 Text Interpretation:  Sinus rhythm Anteroseptal infarct, age indeterminate  Lateral leads are also involved When compared with ECG of 02/22/2015 T wave  abnormality Anterior leads is now Present T wave abnormality Inferior  leads is now Present T wave abnormality Lateral leads is more pronounced  Confirmed by [employer]  MD, Nunzio Cory [id number]) on 03/02/2015 2:37:37 PM      MDM  MDM Reviewed: previous chart, nursing note and vitals Reviewed previous: labs and ECG Interpretation: labs, ECG and x-ray      Results for orders placed or performed during the hospital encounter of [id number]  Basic metabolic panel  Result Value Ref Range   Sodium 140 135 - 145 mmol/L   Potassium 5.2 (H) 3.5 - 5.1 mmol/L   Chloride 103 101 - 111 mmol/L   CO2 30 22 - 32 mmol/L   Glucose, Bld 157 (H) 65 - 99 mg/dL   BUN 38 (H) 6 - 20 mg/dL   Creatinine, Ser 1.53 (H) 0.61 - 1.24 mg/dL   Calcium 9.6 8.9 - 10.3 mg/dL   GFR calc non Af Amer 41 (L) >60 mL/min   GFR calc Af Amer 48 (L) >60 mL/min   Anion gap 7 5 - 15  Troponin I  Result Value Ref Range   Troponin I 0.09 (H) <0.031 ng/mL  CBC with Differential  Result Value Ref Range   WBC 9.9 4.0 - 10.5 K/uL   RBC 3.33 (L) 4.22 - 5.81 MIL/uL   Hemoglobin 10.0 (L) 13.0 - 17.0 g/dL   HCT 30.8 (L) 39.0 - 52.0 %   MCV 92.5 78.0 - 100.0 fL   MCH 30.0 26.0 - 34.0 pg   MCHC 32.5 30.0 - 36.0 g/dL   RDW 14.1 11.5 - 15.5 %   Platelets 345 150 - 400  K/uL   Neutrophils Relative % 75 43 - 77 %   Neutro Abs 7.4 1.7 - 7.7 K/uL   Lymphocytes Relative 10 (L) 12 - 46 %   Lymphs Abs 1.0 0.7 - 4.0 K/uL   Monocytes  Relative 9 3 - 12 %   Monocytes Absolute 0.9 0.1 - 1.0 K/uL   Eosinophils Relative 5 0 - 5 %   Eosinophils Absolute 0.5 0.0 - 0.7 K/uL   Basophils Relative 1 0 - 1 %   Basophils Absolute 0.1 0.0 - 0.1 K/uL    Dg Chest Port 1 View 03/02/2015   CLINICAL DATA:  Chest pain.  New onset lower chest pain.  EXAM: PORTABLE CHEST - 1 VIEW  COMPARISON:  02/22/2015.  FINDINGS: Cardiopericardial silhouette is enlarged. The size of the cardiopericardial silhouette is accentuated both by apical lordotic projection and some rotation on this examination. That is head, of the cardiopericardial silhouette is larger than on the prior exam 02/22/2015. There is LEFT basilar airspace disease and atelectasis. Probable LEFT pleural effusion. RIGHT lung base appears within normal limits allowing for and a penetration on this exam. Monitoring leads project over the chest. CABG/median sternotomy.  IMPRESSION: Cardiomegaly with new LEFT basilar density likely representing a combination of atelectasis, effusion and asymmetric pulmonary edema.   Electronically Signed   By: Dereck Ligas M.D.   On: 03/02/2015 14:53   Ct Angio Chest Pe W/cm &/or Wo Cm 03/02/2015   CLINICAL DATA:  81 year old male with chest pain and shortness of breath which developed while in rehab earlier today  EXAM: CT ANGIOGRAPHY CHEST WITH CONTRAST  TECHNIQUE: Multidetector CT imaging of the chest was performed using the standard protocol during bolus administration of intravenous contrast. Multiplanar CT image reconstructions and MIPs were obtained to evaluate the vascular anatomy.  CONTRAST:  154mL OMNIPAQUE IOHEXOL 350 MG/ML SOLN  COMPARISON:  Chest x-ray obtained earlier today  FINDINGS: Mediastinum: Unremarkable CT appearance of the thyroid gland. No suspicious mediastinal or hilar adenopathy. No  soft tissue mediastinal mass. The thoracic esophagus is unremarkable.  Heart/Vascular: Adequate opacification of the pulmonary arteries to the proximal subsegmental level. Filling defects are present within the right lobar pulmonary artery extending into segmental branches. Additionally, there are branches within segmental pulmonary arteries in the right middle lobe and within the right lower lobar pulmonary artery extending into segmental branches. No definite pulmonary embolus identified on the left. The heart is enlarged and there is both left ventricular dilatation and concentric hypertrophy of the left ventricular myocardium. Normal RV/LV ratio. No evidence of right heart strain. Atherosclerotic calcifications throughout the coronary arteries. Patient is status post median sternotomy with evidence of CABG. No pericardial effusion.  Lungs/Pleura: Moderately large layering left pleural effusion with associated left basilar atelectasis. Trace dependent atelectasis in the right lower lobe. No evidence of pulmonary infarct.  Bones/Soft Tissues: No acute fracture or aggressive appearing lytic or blastic osseous lesion.  Upper Abdomen: Visualized upper abdominal organs are unremarkable.  Review of the MIP images confirms the above findings.  IMPRESSION: 1. Positive for acute pulmonary emboli within the lobar and segmental pulmonary arteries in the right upper and lower lobes. No evidence of left-sided pulmonary embolus and no evidence of right heart strain. 2. Cardiomegaly with both left ventricular dilatation and concentric hypertrophy of the left ventricular myocardium. 3. Surgical changes of multivessel coronary artery bypass procedure. 4. Moderately large layering left pleural effusion with associated left lower lobe atelectasis. These results were called by telephone at the time of interpretation on 03/02/2015 at 5:03 pm to Dr. Wyvonnia Dusky, who verbally acknowledged these results.   Electronically Signed   By: Jacqulynn Cadet M.D.   On: 03/02/2015 17:05    1550:  Pt denies symptoms while in the  ED. BUN/Cr per baseline. EPIC chart review: pt's LLE edema with skin changes is chronic. Dx and testing d/w pt.  Questions answered.  Verb understanding, agreeable to admit.  T/C to [facility] Dr. Anastasio Champion, case discussed, including:  HPI, pertinent PM/SHx, VS/PE, dx testing, ED course and treatment:  Agreeable to admit, requests to obtain CT-A chest to r/o PE before admit, write temporary orders, obtain observation tele bed to team [facility].  1710:  T/C to [facility] Dr. Anastasio Champion, case discussed, including:  HPI, pertinent PM/SHx, VS/PE, dx testing, ED course and treatment:  Updated regarding CT-A chest. T/C to [facility] Dr. Clydene Laming, case discussed, including:  HPI, pertinent PM/SHx, VS/PE, dx testing, ED course and treatment:  Pt approximately 2 weeks out from surgery, heparin is OK to treat PE.  IV heparin ordered.  Francine Graven, DO 03/05/15 1221

## 2015-03-02 NOTE — Progress Notes (Signed)
ANTICOAGULATION CONSULT NOTE - Initial Consult  Pharmacy Consult for Heparin Indication: pulmonary embolus  Allergies  Allergen Reactions  . Bactrim [Sulfamethoxazole-Trimethoprim] Rash    Patient Measurements: Height: 5\' 9"  (175.3 cm) Weight: 211 lb (95.709 kg) IBW/kg (Calculated) : 70.7 Heparin Dosing Weight: 90.5 kg  Vital Signs: Temp: 98 F (36.7 C) (07/11 1412) Temp Source: Oral (07/11 1410) BP: 163/88 mmHg (07/11 1700) Pulse Rate: 103 (07/11 1700)  Labs:  Recent Labs  03/02/15 0630 03/02/15 1430  HGB 9.7* 10.0*  HCT 31.1* 30.8*  PLT 359 345  CREATININE 1.53* 1.53*  TROPONINI  --  0.09*    Estimated Creatinine Clearance: 44 mL/min (by C-G formula based on Cr of 1.53).   Medical History: Past Medical History  Diagnosis Date  . Hypertension   . High potassium   . Varicose veins   . Hyperlipidemia   . Basal cell carcinoma of forehead 2016 X 2  . Basal cell carcinoma of left earlobe   . Old myocardial infarct     "sometime in the past; don't know when" (02/07/2015)  . Type II diabetes mellitus   . Pneumonia X 1  . GERD (gastroesophageal reflux disease)   . Arthritis     "legs" (02/07/2015)  . History of gout X 1  . Diabetic peripheral neuropathy     "left foot" (02/07/2015)  . Respiratory failure   . Pain and swelling of left lower leg     chronic    Medications:   (Not in a hospital admission)  Assessment: 81 year old man who had CABG a proximally 2 weeks ago and has been in rehabilitation for this amount of time postoperatively and has been doing well. Troponin slightly increased.  Heparin per pharmacy for PE Labs reviewed PTA medications reviewed, no anticoagulants   Goal of Therapy:  Heparin level 0.3-0.7 units/ml Monitor platelets by anticoagulation protocol: Yes   Plan:  Give 4000 units bolus x 1 Start heparin infusion at 1400 units/hr Check anti-Xa level in 6-8 hours and daily while on heparin Continue to monitor H&H and  platelets  Abner Greenspan, Byrd Terrero Bennett 03/02/2015,5:25 PM

## 2015-03-02 NOTE — H&P (Addendum)
[facility] History and Physical  Zachary Mcmahon [id number] DOB: 1931-11-05 DOA: 03/02/2015  Referring physician: ER, Dr. Thurnell Garbe PCP: [name], MD   Chief Complaint: Chest pain  HPI: Zachary Mcmahon is a 81 y.o. male  This is an 81 year old man who had CABG a proximally 2 weeks ago and has been in rehabilitation for this amount of time postoperatively and has been doing well. Today he had one episode lasting 45 minutes of sharp left-sided pain which initially was thought to be in the chest but when I went to see him today, he describes this pain in the left upper abdomen. He denies any nausea, vomiting, dyspnea, fever, sweating with the pain. His pain is now completely resolved. He denies any cough. He describes the pain as a level of 3/10. He is now being admitted for further mastication.   Review of Systems:  Apart from the symptoms above, all systems negative.  Past Medical History  Diagnosis Date  . Hypertension   . High potassium   . Varicose veins   . Hyperlipidemia   . Basal cell carcinoma of forehead 2016 X 2  . Basal cell carcinoma of left earlobe   . Old myocardial infarct     "sometime in the past; don't know when" (02/07/2015)  . Type II diabetes mellitus   . Pneumonia X 1  . GERD (gastroesophageal reflux disease)   . Arthritis     "legs" (02/07/2015)  . History of gout X 1  . Diabetic peripheral neuropathy     "left foot" (02/07/2015)  . Respiratory failure   . Pain and swelling of left lower leg     chronic   Past Surgical History  Procedure Laterality Date  . Knee arthroscopy Right ~ 2008  . Orif hip fracture  03/20/2012    Procedure: OPEN REDUCTION INTERNAL FIXATION HIP;  Surgeon: Sanjuana Kava, MD;  Location: [facility];  Service: Orthopedics;  Laterality: Right;  . Lower extremity venous doppler  07/11/2012    No evidence of DVT in the left lower extremity. Evidence of partially recanalized, chronic, non-obstructive thrombus in the left great SV and its  branches consistent with significant reflux consistent with post-phlebitic syndrome. Significant reflux of the left short saphenous vein.  . Cardiovascular stress test  05/29/2012    Mild-moderate perfusion defect due to infarct/scar with mild-moderate perinfarct ischemia seen in the mid anterior, apical anterior, apical septal, and apical regions. No ECG changes. Global LV systolic function is severely reduced.  . Transthoracic echocardiogram  04/18/2012    EF 45%, mild-moderate LVH  . Transthoracic echocardiogram  04/18/12    EF% 45%.SEVERE HYPOKINESIS TO AKINESIS OF THE MID-DISTAL INFEROLATERAL MYOCARDIUM AND MUCH OF THE APEX.  Marland Kitchen Lexiscam myocardial perfusion  05/29/12    MARKED PERFUSION DEFECT DUE TO INFARC/SCAR WITH MILD PERINFARCT ISCHEMIA IN THE BASAL INFERIOR, MID INFEROSEPTAL, MID INFERIOR AND APICALINFERIOR REGION. EF%33%. PERIINFARCT ISCHEMIA IN THE MID ANTERIOR, APICAL ANTERIOR, APICAL SEPTAL AND APICAL REGIONS.  . Fracture surgery    . Cystoscopy w/ stone manipulation  X 1  . Basal cell carcinoma excision      "probably 1/2 dozen cut off face, left ear" (02/07/2015)  . Cardiac catheterization N/A 02/09/2015    Procedure: Left Heart Cath and Coronary Angiography;  Surgeon: Leonie Man, MD;  Location: [facility];  Service: Cardiovascular;  Laterality: N/A;  . Coronary artery bypass graft N/A 02/17/2015    Procedure: CORONARY ARTERY BYPASS GRAFTING (CABG), ON PUMP, TIMES FIVE, USING LEFT  INTERNAL MAMMARY ARTERY, RIGHT GREATER SAPHENOUS VEIN HARVESTED ENDOSCOPICALLY;  Surgeon: [name], MD;  Location: [facility];  Service: Open Heart Surgery;  Laterality: N/A;  -LIMA to LAD -SVG to DIAGONAL - SEQ SVG to OM1 and PLB -SVG to PDA  . Tee without cardioversion N/A 02/17/2015    Procedure: TRANSESOPHAGEAL ECHOCARDIOGRAM (TEE);  Surgeon: [name], MD;  Location: [facility];  Service: Open Heart Surgery;  Laterality: N/A;  . Open heart sx  02/17/15   Social History:  reports that  he has never smoked. He has never used smokeless tobacco. He reports that he does not drink alcohol or use illicit drugs.  Allergies  Allergen Reactions  . Bactrim [Sulfamethoxazole-Trimethoprim] Rash    Family History  Problem Relation Age of Onset  . Deep vein thrombosis Mother   . Hypertension Mother   . Hypertension Sister   . Diabetes Brother   . Hyperlipidemia Brother   . Hypertension Brother   . Heart attack Brother     Prior to Admission medications   Medication Sig Start Date End Date Taking? Authorizing Provider  aspirin EC 81 MG tablet Take 81 mg by mouth every morning.    Historical Provider, MD  atorvastatin (LIPITOR) 20 MG tablet TAKE 1 TABLET BY MOUTH DAILY AT 6:00 P.M. ( MUST SCHEDULE AN APPOINTMENT FOR FUTURE REFILLS ) 03/27/14   Lorretta Harp, MD  cloNIDine (CATAPRES) 0.2 MG tablet Take 0.2 mg by mouth 2 (two) times daily. 01/20/15   Historical Provider, MD  ferrous sulfate 325 (65 FE) MG tablet Take 1 tablet (325 mg total) by mouth daily with breakfast. 02/24/15   Donielle Liston Alba, PA-C  furosemide (LASIX) 40 MG tablet Take 1 tablet (40 mg total) by mouth daily. 02/24/15   Donielle Liston Alba, PA-C  gabapentin (NEURONTIN) 100 MG capsule Take 100 mg by mouth 3 (three) times daily.  04/01/14   Historical Provider, MD  LEVEMIR FLEXTOUCH 100 UNIT/ML Pen Inject 36 Units into the skin at bedtime.  01/20/15   Historical Provider, MD  metoprolol tartrate (LOPRESSOR) 25 MG tablet Take 1 tablet (25 mg total) by mouth 2 (two) times daily. 02/24/15   Donielle Liston Alba, PA-C  omeprazole (PRILOSEC) 20 MG capsule Take 20 mg by mouth daily.  04/02/14   Historical Provider, MD  potassium chloride SA (K-DUR,KLOR-CON) 10 MEQ tablet Take 1 tablet (10 mEq total) by mouth daily. 02/24/15   Donielle Liston Alba, PA-C  traMADol (ULTRAM) 50 MG tablet Take 1 tablet (50 mg total) by mouth every 6 (six) hours as needed for moderate pain. 02/24/15   Nani Skillern, PA-C   Physical Exam: Filed  Vitals:   03/02/15 1500 03/02/15 1530 03/02/15 1536 03/02/15 1600  BP: 168/154 142/80 142/80 162/76  Pulse:  90 90   Temp:      TempSrc:      Resp: 22 15 12 19   Height:      Weight:      SpO2:  91% 94%     Wt Readings from Last 3 Encounters:  03/02/15 95.709 kg (211 lb)  02/24/15 95.482 kg (210 lb 8 oz)  04/23/14 96.163 kg (212 lb)    General:  Appears calm and comfortable. He does not appear to be in pain. Eyes: PERRL, normal lids, irises & conjunctiva ENT: grossly normal hearing, lips & tongue Neck: no LAD, masses or thyromegaly Cardiovascular: RRR, no m/r/g. No LE edema. Telemetry: SR, no arrhythmias  Respiratory: CTA bilaterally, no w/r/r. Normal  respiratory effort. Abdomen: soft, ntnd Skin: no rash or induration seen on limited exam Musculoskeletal: He is tender in the left anterior chest wall but this does not reproduce the pain that he describes. Psychiatric: grossly normal mood and affect, speech fluent and appropriate Neurologic: grossly non-focal.          Labs on Admission:  Basic Metabolic Panel:  Recent Labs Lab 03/02/15 0630 03/02/15 1430  NA 142 140  K 4.4 5.2*  CL 105 103  CO2 29 30  GLUCOSE 52* 157*  BUN 36* 38*  CREATININE 1.53* 1.53*  CALCIUM 9.9 9.6   Liver Function Tests: No results for input(s): AST, ALT, ALKPHOS, BILITOT, PROT, ALBUMIN in the last 168 hours. No results for input(s): LIPASE, AMYLASE in the last 168 hours. No results for input(s): AMMONIA in the last 168 hours. CBC:  Recent Labs Lab 03/02/15 0630 03/02/15 1430  WBC 9.0 9.9  NEUTROABS  --  7.4  HGB 9.7* 10.0*  HCT 31.1* 30.8*  MCV 93.1 92.5  PLT 359 345   Cardiac Enzymes:  Recent Labs Lab 03/02/15 1430  TROPONINI 0.09*    BNP (last 3 results)  Recent Labs  02/06/15 1612  BNP 279.0*    ProBNP (last 3 results) No results for input(s): PROBNP in the last 8760 hours.  CBG:  Recent Labs Lab 02/23/15 2107 02/24/15 0629 02/24/15 1203  GLUCAP 162*  147* 156*    Radiological Exams on Admission: Ct Angio Chest Pe W/cm &/or Wo Cm  03/02/2015   CLINICAL DATA:  81 year old male with chest pain and shortness of breath which developed while in rehab earlier today  EXAM: CT ANGIOGRAPHY CHEST WITH CONTRAST  TECHNIQUE: Multidetector CT imaging of the chest was performed using the standard protocol during bolus administration of intravenous contrast. Multiplanar CT image reconstructions and MIPs were obtained to evaluate the vascular anatomy.  CONTRAST:  160mL OMNIPAQUE IOHEXOL 350 MG/ML SOLN  COMPARISON:  Chest x-ray obtained earlier today  FINDINGS: Mediastinum: Unremarkable CT appearance of the thyroid gland. No suspicious mediastinal or hilar adenopathy. No soft tissue mediastinal mass. The thoracic esophagus is unremarkable.  Heart/Vascular: Adequate opacification of the pulmonary arteries to the proximal subsegmental level. Filling defects are present within the right lobar pulmonary artery extending into segmental branches. Additionally, there are branches within segmental pulmonary arteries in the right middle lobe and within the right lower lobar pulmonary artery extending into segmental branches. No definite pulmonary embolus identified on the left. The heart is enlarged and there is both left ventricular dilatation and concentric hypertrophy of the left ventricular myocardium. Normal RV/LV ratio. No evidence of right heart strain. Atherosclerotic calcifications throughout the coronary arteries. Patient is status post median sternotomy with evidence of CABG. No pericardial effusion.  Lungs/Pleura: Moderately large layering left pleural effusion with associated left basilar atelectasis. Trace dependent atelectasis in the right lower lobe. No evidence of pulmonary infarct.  Bones/Soft Tissues: No acute fracture or aggressive appearing lytic or blastic osseous lesion.  Upper Abdomen: Visualized upper abdominal organs are unremarkable.  Review of the MIP  images confirms the above findings.  IMPRESSION: 1. Positive for acute pulmonary emboli within the lobar and segmental pulmonary arteries in the right upper and lower lobes. No evidence of left-sided pulmonary embolus and no evidence of right heart strain. 2. Cardiomegaly with both left ventricular dilatation and concentric hypertrophy of the left ventricular myocardium. 3. Surgical changes of multivessel coronary artery bypass procedure. 4. Moderately large layering left pleural effusion with associated left  lower lobe atelectasis. These results were called by telephone at the time of interpretation on 03/02/2015 at 5:03 pm to Dr. Wyvonnia Dusky, who verbally acknowledged these results.   Electronically Signed   By: Jacqulynn Cadet M.D.   On: 03/02/2015 17:05   Dg Chest Port 1 View  03/02/2015   CLINICAL DATA:  Chest pain.  New onset lower chest pain.  EXAM: PORTABLE CHEST - 1 VIEW  COMPARISON:  02/22/2015.  FINDINGS: Cardiopericardial silhouette is enlarged. The size of the cardiopericardial silhouette is accentuated both by apical lordotic projection and some rotation on this examination. That is head, of the cardiopericardial silhouette is larger than on the prior exam 02/22/2015. There is LEFT basilar airspace disease and atelectasis. Probable LEFT pleural effusion. RIGHT lung base appears within normal limits allowing for and a penetration on this exam. Monitoring leads project over the chest. CABG/median sternotomy.  IMPRESSION: Cardiomegaly with new LEFT basilar density likely representing a combination of atelectasis, effusion and asymmetric pulmonary edema.   Electronically Signed   By: Dereck Ligas M.D.   On: 03/02/2015 14:53    EKG: Independently reviewed. Sinus rhythm with T-wave changes consistent with post MI changes. No acute ST elevation.  Assessment/Plan   1. Chest pain. History of his pain is more in the left upper abdomen. However, his troponin level is slightly increased. His chest x-ray  is slightly abnormal with left basilar density. He is in the process of getting a CT angiogram of the chest to look for primary embolism. We will cycle troponins levels. 2. Chronic kidney disease. Stable. 3. Status post CABG. Appears to be stable. 4. Diabetes. Continue home medications and sliding scale of insulin.  Further recommendations will depend on patient's hospital progress.   Code Status: Full code.   DVT Prophylaxis: Lovenox.  Family Communication: I discussed the plan with the patient at the bedside.  Disposition Plan: Back to the nursing home for rehabilitation when medically stable.  Time spent: 45 minutes.  Doree Albee [facility] Pager [phone].   I just reviewed the report of the CT angiogram the chest. This is positive for pulmonary embolism. He will be started on IV heparin and can be converted to oral anticoagulation tomorrow.

## 2015-03-02 NOTE — ED Notes (Signed)
Pt at [facility] for rehab following open heart sx. While ambulating with walker complained of chest pain located left lower chest pain. No radiating. Described as sharp. Once back in room at [facility] pain subsided. No pain at this time. No sob

## 2015-03-02 NOTE — Progress Notes (Signed)
Patient ID: Zachary Mcmahon, male   DOB: Jul 13, 1932, 81 y.o.   MRN: [id number] Facility; [facility] Chief complaint; chest pain while in physical therapy History; this is a patient I admitted to the building last week. He was an 81 year old man who initially presented to hospital with chest pain. He had positive cardiac enzymes and ruled in for a non-ST elevation MI. He underwent an urgent cardiac catheterization that showed multivessel coronary artery disease and eventually he went on to have a coronary bypass grafting procedure 5. I saw him last week he had elevated jugular venous pressure and left greater than light a right leg edema as well as some sacral pitting edema. I increased his Lasix from 40-60.  I was asked to see him today because of chest pain while in physical therapy. The patient tells me he was walking and he developed a sharp "lightning like" pain in the left anterior chest. He did not stop but sat down to do some bicycling bicycling exercises. The chest pain continued and didn't really stop until he came back up to the floor. All he thinks he this lasted about 40 minutes. It was not associated with shortness of breath nausea vomiting and did not radiate as opposed to the pain he had before his surgery. He did think he had some slight diaphoresis.  Current medications Enteric-coated aspirin 81 daily Atorvastatin 20 daily Clonidine 0.2 twice a day Lasix 60 daily Neurontin 103 times daily Levemir 36 units at bedtime Metopic low 25 twice a day Omeprazole 20 daily KCl 10 with accordance daily Ultram 50 every 6 hours when necessary.  Review of systems Respiratory no cough no shortness of breath GI no nausea vomiting GU no dysuria.   Physical examination Gen. the patient is not in any distress. Vitals blood pressure 153/63 pulse 90 respirations 18 temperature 97.9 O2 sat 93% on room air. Respiratory clear entry bilaterally Cardiac heart sounds are normal no S4 no S3 JVP is  visible at 45. Abdomen;. Emboli call hernia no liver spleen or tenderness Extremities he has left greater than right leg edema there was some warmth and tenderness in the left calf.  Impression/plan #1 exertional chest pain in a man who is just had a CABG 5. He is pain-free now. EKG at rest shows ST depression across V2 through V5 however the T-wave inversions seems more prominent in V2 through V4 then a cardiogram from 7/3. Patient will need to be transported to the emergency room.

## 2015-03-03 ENCOUNTER — Observation Stay (HOSPITAL_BASED_OUTPATIENT_CLINIC_OR_DEPARTMENT_OTHER): Payer: Medicare Other

## 2015-03-03 ENCOUNTER — Observation Stay (HOSPITAL_COMMUNITY): Payer: Medicare Other

## 2015-03-03 DIAGNOSIS — I10 Essential (primary) hypertension: Secondary | ICD-10-CM | POA: Diagnosis not present

## 2015-03-03 DIAGNOSIS — I2699 Other pulmonary embolism without acute cor pulmonale: Secondary | ICD-10-CM | POA: Diagnosis not present

## 2015-03-03 DIAGNOSIS — J90 Pleural effusion, not elsewhere classified: Secondary | ICD-10-CM | POA: Diagnosis not present

## 2015-03-03 DIAGNOSIS — I517 Cardiomegaly: Secondary | ICD-10-CM | POA: Diagnosis not present

## 2015-03-03 DIAGNOSIS — N183 Chronic kidney disease, stage 3 (moderate): Secondary | ICD-10-CM | POA: Diagnosis not present

## 2015-03-03 DIAGNOSIS — R079 Chest pain, unspecified: Secondary | ICD-10-CM | POA: Diagnosis not present

## 2015-03-03 LAB — PROTEIN, BODY FLUID: Total protein, fluid: 3.1 g/dL

## 2015-03-03 LAB — BODY FLUID CELL COUNT WITH DIFFERENTIAL
Eos, Fluid: 5 %
Lymphs, Fluid: 75 %
MONOCYTE-MACROPHAGE-SEROUS FLUID: 10 % — AB (ref 50–90)
NEUTROPHIL FLUID: 10 % (ref 0–25)
WBC FLUID: 463 uL (ref 0–1000)

## 2015-03-03 LAB — CBC
HEMATOCRIT: 30.7 % — AB (ref 39.0–52.0)
HEMOGLOBIN: 9.8 g/dL — AB (ref 13.0–17.0)
MCH: 29.3 pg (ref 26.0–34.0)
MCHC: 31.9 g/dL (ref 30.0–36.0)
MCV: 91.6 fL (ref 78.0–100.0)
Platelets: 346 10*3/uL (ref 150–400)
RBC: 3.35 MIL/uL — AB (ref 4.22–5.81)
RDW: 14 % (ref 11.5–15.5)
WBC: 9.4 10*3/uL (ref 4.0–10.5)

## 2015-03-03 LAB — HEPARIN LEVEL (UNFRACTIONATED)
HEPARIN UNFRACTIONATED: 0.1 [IU]/mL — AB (ref 0.30–0.70)
HEPARIN UNFRACTIONATED: 0.19 [IU]/mL — AB (ref 0.30–0.70)
Heparin Unfractionated: 0.45 IU/mL (ref 0.30–0.70)

## 2015-03-03 LAB — GLUCOSE, CAPILLARY
Glucose-Capillary: 59 mg/dL — ABNORMAL LOW (ref 65–99)
Glucose-Capillary: 93 mg/dL (ref 65–99)
Glucose-Capillary: 121 mg/dL — ABNORMAL HIGH (ref 65–99)
Glucose-Capillary: 147 mg/dL — ABNORMAL HIGH (ref 65–99)
Glucose-Capillary: 148 mg/dL — ABNORMAL HIGH (ref 65–99)

## 2015-03-03 LAB — GRAM STAIN

## 2015-03-03 LAB — LACTATE DEHYDROGENASE: LDH: 214 U/L — ABNORMAL HIGH (ref 98–192)

## 2015-03-03 LAB — LACTATE DEHYDROGENASE, PLEURAL OR PERITONEAL FLUID: LD, Fluid: 153 U/L — ABNORMAL HIGH (ref 3–23)

## 2015-03-03 LAB — PROTEIN, TOTAL: Total Protein: 6.2 g/dL — ABNORMAL LOW (ref 6.5–8.1)

## 2015-03-03 MED ORDER — INSULIN DETEMIR 100 UNIT/ML ~~LOC~~ SOLN
30.0000 [IU] | Freq: Every day | SUBCUTANEOUS | Status: DC
  Administered 2015-03-04: 30 [IU] via SUBCUTANEOUS
  Filled 2015-03-03 (×2): qty 0.3

## 2015-03-03 MED ORDER — APIXABAN 5 MG PO TABS
10.0000 mg | ORAL_TABLET | Freq: Two times a day (BID) | ORAL | Status: DC
  Administered 2015-03-04 – 2015-03-05 (×3): 10 mg via ORAL
  Filled 2015-03-03 (×3): qty 2

## 2015-03-03 MED ORDER — GLUCOSE 40 % PO GEL
ORAL | Status: AC
  Administered 2015-03-03: 08:00:00
  Filled 2015-03-03: qty 1

## 2015-03-03 MED ORDER — APIXABAN (ELIQUIS) EDUCATION KIT FOR DVT/PE PATIENTS
PACK | Freq: Once | Status: AC
  Administered 2015-03-04: 12:00:00
  Filled 2015-03-03: qty 1

## 2015-03-03 MED ORDER — LORAZEPAM 0.5 MG PO TABS
0.5000 mg | ORAL_TABLET | Freq: Once | ORAL | Status: AC
  Administered 2015-03-03: 0.5 mg via ORAL
  Filled 2015-03-03: qty 1

## 2015-03-03 MED ORDER — APIXABAN 5 MG PO TABS: 5.0000 mg | ORAL_TABLET | Freq: Two times a day (BID) | ORAL | Status: DC

## 2015-03-03 NOTE — Care Management Note (Signed)
Case Management Note  Patient Details  Name: Dodge Friedrichs MRN: [id number] Date of Birth: Apr 18, 1932  Subjective/Objective:                  Pt admitted from [facility] center with CP and PE. Pt will return to facility at discharge.  Action/Plan: CSW is aware and will arrange discharge back to facility at discharge.  Expected Discharge Date:                  Expected Discharge Plan:  Skilled Nursing Facility  In-House Referral:  Clinical Social Work  Discharge planning Services  CM Consult  Post Acute Care Choice:  NA Choice offered to:  NA  DME Arranged:    DME Agency:     HH Arranged:    [place] Agency:     Status of Service:  Completed, signed off  Medicare Important Message Given:    Date Medicare IM Given:    Medicare IM give by:    Date Additional Medicare IM Given:    Additional Medicare Important Message give by:     If discussed at [place] of Stay Meetings, dates discussed:    Additional Comments:  Joylene Draft, RN 03/03/2015, 3:11 PM

## 2015-03-03 NOTE — Progress Notes (Addendum)
Patient Demographics  Zachary Mcmahon, is a 81 y.o. male, DOB - 1932-07-13, [id number]  Admit date - 03/02/2015   Admitting Physician Doree Albee, MD  Outpatient Primary MD for the patient is [name], MD  LOS -    Chief Complaint  Patient presents with  . Chest Pain       Admission HPI/Brief narrative: 81 year old man who had CABG a proximally 2 weeks ago at [facility], presents with complain of Chest pain, CTA chest significant for PE.  Subjective:   Zachary Mcmahon today has, No headache,  No abdominal pain - No Nausea, No new weakness tingling or numbness, No Cough - SOB. Denies any further chest pain.  Assessment & Plan    Active Problems:   DM type 2 (diabetes mellitus, type 2)   CKD (chronic kidney disease) stage 3, GFR 30-59 ml/min   Chest pain   S/P CABG x 5  Pulmonary Embolism - Patient presents with complaints of chest pain, CTA chest significant for PE in upper and lower lobes, this is most likely provoked secondary to recent surgery, will check venous Doppler to rule out DVT. - Check 2-D echo to rule out right ventricular strain. - Continue with heparin drip, transition to oral Eliquis in a.m.  Chest pain with Elevated troponins -  likely related to his acute pulmonary embolism - Follow on 2-D echo  Left pleural effusion - Contacted by Dr. Reece Packer team, who reviewed his CT chest, they requested thoracentesis prior to initiation of NOAC.  CAD - post recent CABG, continue with aspirin, atorvastatin, metoprolol  Diabetes mellitus - Follow on hemoglobin A1c - Will decrease Levemir giving an episode of hypoglycemia, continue with ISS,   CKD stage III - Appears at baseline, continue to monitor  Hypertension - on clonidine and metoprolol   Code Status: Full  Family Communication: none at bedside  Disposition Plan: pending PT, and wish to go home, back to [facility]   Procedures  none   Consults   none   Medications  Scheduled Meds: . aspirin EC  81 mg Oral q morning - 10a  . atorvastatin  20 mg Oral q1800  . cloNIDine  0.2 mg Oral BID  . ferrous sulfate  325 mg Oral Q breakfast  . furosemide  40 mg Oral Daily  . gabapentin  100 mg Oral TID  . insulin aspart  0-15 Units Subcutaneous TID WC  . insulin aspart  0-5 Units Subcutaneous QHS  . insulin detemir  36 Units Subcutaneous QHS  . metoprolol tartrate  25 mg Oral BID  . pantoprazole  40 mg Oral Daily  . potassium chloride SA  10 mEq Oral Daily   Continuous Infusions: . heparin Stopped (03/03/15 1048)   PRN Meds:.acetaminophen, ondansetron (ZOFRAN) IV, traMADol  DVT Prophylaxis  Heparin GTT Lab Results  Component Value Date   PLT 346 03/03/2015    Antibiotics    Anti-infectives    None          Objective:   Filed Vitals:   03/02/15 1855 03/02/15 2215 03/03/15 0536 03/03/15 0856  BP: 161/81 154/72 127/70 147/72  Pulse: 102 94 70 103  Temp: 98.1 F (36.7 C) 98.2 F (36.8 C) 98.1 F (  36.7 C)   TempSrc: Oral Oral Oral   Resp: 18 18 18    Height: 5' 9.5" (1.765 m)     Weight: 89.6 kg (197 lb 8.5 oz)     SpO2: 93% 92% 100%     Wt Readings from Last 3 Encounters:  03/02/15 89.6 kg (197 lb 8.5 oz)  02/24/15 95.482 kg (210 lb 8 oz)  04/23/14 96.163 kg (212 lb)     Intake/Output Summary (Last 24 hours) at 03/03/15 1157 Last data filed at 03/03/15 [id number]  Gross per 24 hour  Intake  384.2 ml  Output    950 ml  Net -565.8 ml     Physical Exam  Awake Alert, Oriented X 3, No new F.N deficits, Normal affect [place].AT,PERRAL Supple Neck,No JVD, No cervical lymphadenopathy appriciated.  Symmetrical Chest wall movement, left side diminished air entry, CABG surgical scar revealed RRR,No Gallops,Rubs or new Murmurs, No Parasternal Heave +ve B.Sounds, Abd Soft, No tenderness, No organomegaly appriciated, No rebound - guarding or rigidity. No Cyanosis,  Clubbing +1 edema, left lower extremity discoloration.   Data Review   Micro Results No results found for this or any previous visit (from the past 240 hour(s)).  Radiology Reports Dg Chest 2 View  02/22/2015   CLINICAL DATA:  History of CABG.  Shortness of breath.  EXAM: CHEST  2 VIEW  COMPARISON:  02/21/2015  FINDINGS: Heart size is stable with post CABG changes. Negative for pneumothorax. Persistent densities at both lung bases compatible with small pleural effusions and basilar atelectasis. Minimal change from the prior examination. Multilevel degenerative changes in thoracic spine. Epicardial pacer wires noted on the lateral view.  IMPRESSION: Stable chest radiograph findings with small pleural effusions and bibasilar atelectasis.   Electronically Signed   By: Markus Daft M.D.   On: 02/22/2015 08:56   Dg Chest 2 View  02/21/2015   CLINICAL DATA:  Post CABG  EXAM: CHEST  2 VIEW  COMPARISON:  02/20/2015  FINDINGS: Changes of CABG. Interval removal of right central line. Heart is upper limits normal in size. Small bilateral effusions, left greater than right. Minimal bibasilar atelectasis, improving since prior study. No pneumothorax.  IMPRESSION: Improving aeration with decreasing bibasilar atelectasis. Small effusions. No pneumothorax.   Electronically Signed   By: Rolm Baptise M.D.   On: 02/21/2015 07:10   Dg Chest 2 View  02/14/2015   CLINICAL DATA:  Congestive heart failure  EXAM: CHEST  2 VIEW  COMPARISON:  02/13/2015; 02/12/2015; 02/11/2015  FINDINGS: Grossly unchanged enlarged cardiac silhouette and mediastinal contours given persistently reduced lung volumes. The pulmonary vasculature remains distinct with cephalization of flow. Minimally improved aeration of the lung bases with residual bibasilar opacities, left greater than right. No new focal airspace opacities. Trace bilateral effusions are suspected. Stable position of support apparatus. No pneumothorax. Unchanged bones.  IMPRESSION:  Similar findings of hypoventilation and pulmonary edema with no change to minimally improved bibasilar atelectasis, left greater than right.   Electronically Signed   By: Sandi Mariscal M.D.   On: 02/14/2015 08:07   Ct Angio Chest Pe W/cm &/or Wo Cm  03/02/2015   CLINICAL DATA:  81 year old male with chest pain and shortness of breath which developed while in rehab earlier today  EXAM: CT ANGIOGRAPHY CHEST WITH CONTRAST  TECHNIQUE: Multidetector CT imaging of the chest was performed using the standard protocol during bolus administration of intravenous contrast. Multiplanar CT image reconstructions and MIPs were obtained to evaluate the vascular anatomy.  CONTRAST:  159mL OMNIPAQUE IOHEXOL 350 MG/ML SOLN  COMPARISON:  Chest x-ray obtained earlier today  FINDINGS: Mediastinum: Unremarkable CT appearance of the thyroid gland. No suspicious mediastinal or hilar adenopathy. No soft tissue mediastinal mass. The thoracic esophagus is unremarkable.  Heart/Vascular: Adequate opacification of the pulmonary arteries to the proximal subsegmental level. Filling defects are present within the right lobar pulmonary artery extending into segmental branches. Additionally, there are branches within segmental pulmonary arteries in the right middle lobe and within the right lower lobar pulmonary artery extending into segmental branches. No definite pulmonary embolus identified on the left. The heart is enlarged and there is both left ventricular dilatation and concentric hypertrophy of the left ventricular myocardium. Normal RV/LV ratio. No evidence of right heart strain. Atherosclerotic calcifications throughout the coronary arteries. Patient is status post median sternotomy with evidence of CABG. No pericardial effusion.  Lungs/Pleura: Moderately large layering left pleural effusion with associated left basilar atelectasis. Trace dependent atelectasis in the right lower lobe. No evidence of pulmonary infarct.  Bones/Soft Tissues: No  acute fracture or aggressive appearing lytic or blastic osseous lesion.  Upper Abdomen: Visualized upper abdominal organs are unremarkable.  Review of the MIP images confirms the above findings.  IMPRESSION: 1. Positive for acute pulmonary emboli within the lobar and segmental pulmonary arteries in the right upper and lower lobes. No evidence of left-sided pulmonary embolus and no evidence of right heart strain. 2. Cardiomegaly with both left ventricular dilatation and concentric hypertrophy of the left ventricular myocardium. 3. Surgical changes of multivessel coronary artery bypass procedure. 4. Moderately large layering left pleural effusion with associated left lower lobe atelectasis. These results were called by telephone at the time of interpretation on 03/02/2015 at 5:03 pm to Dr. Wyvonnia Dusky, who verbally acknowledged these results.   Electronically Signed   By: Jacqulynn Cadet M.D.   On: 03/02/2015 17:05   US Renal  02/10/2015   CLINICAL DATA:  Chronic kidney disease.  EXAM: RENAL / URINARY TRACT ULTRASOUND COMPLETE  COMPARISON:  None.  FINDINGS: Right Kidney:  Length: 12.6 cm. Cortical thinning. Diffusely increased echotexture. No focal abnormality or hydronephrosis.  Left Kidney:  Length: 12.3 cm. Cortical thinning. Diffusely increased echotexture. Multiple cysts, the largest in the lower pole measuring 4.4 cm. These appear simple. No hydronephrosis.  Bladder:  Appears normal for degree of bladder distention.  IMPRESSION: Cortical thinning with increased echotexture bilaterally. Findings suggestive of chronic medical renal disease. No hydronephrosis.   Electronically Signed   By: Rolm Baptise M.D.   On: 02/10/2015 09:33   Dg Chest Port 1 View  03/02/2015   CLINICAL DATA:  Chest pain.  New onset lower chest pain.  EXAM: PORTABLE CHEST - 1 VIEW  COMPARISON:  02/22/2015.  FINDINGS: Cardiopericardial silhouette is enlarged. The size of the cardiopericardial silhouette is accentuated both by apical lordotic  projection and some rotation on this examination. That is head, of the cardiopericardial silhouette is larger than on the prior exam 02/22/2015. There is LEFT basilar airspace disease and atelectasis. Probable LEFT pleural effusion. RIGHT lung base appears within normal limits allowing for and a penetration on this exam. Monitoring leads project over the chest. CABG/median sternotomy.  IMPRESSION: Cardiomegaly with new LEFT basilar density likely representing a combination of atelectasis, effusion and asymmetric pulmonary edema.   Electronically Signed   By: Dereck Ligas M.D.   On: 03/02/2015 14:53   Dg Chest Port 1 View  02/20/2015   CLINICAL DATA:  Status post CABG on February 17, 2015  EXAM: PORTABLE CHEST - 1 VIEW  COMPARISON:  Portable chest x-ray of February 19, 2015  FINDINGS: The cardiopericardial silhouette remains enlarged. The pulmonary vascularity is mildly prominent centrally. The left hemidiaphragm is less well demonstrated today. There is a small left pleural effusion. The right lung is clear. There are 7 intact sternal wires. The right internal jugular venous catheter tip projects over the midportion of the SVC.  IMPRESSION: Slight interval increase in parenchymal density at the left lung base suggests subsegmental atelectasis and/or a small pleural effusion. There is stable enlargement of the cardiac silhouette with mild central pulmonary vascular prominence.   Electronically Signed   By: David  Martinique M.D.   On: 02/20/2015 07:56   Dg Chest Port 1 View  02/19/2015   CLINICAL DATA:  Status post CABG 2 days ago  EXAM: PORTABLE CHEST - 1 VIEW  COMPARISON:  Portable chest x-ray of February 18, 2015  FINDINGS: The lungs are well-expanded. The interstitial markings have improved. The left-sided chest tube is unchanged in position. There is no pneumothorax or significant pleural effusion. The cardiac silhouette remains enlarged. The pulmonary vascularity is normal. There are 7 intact sternal wires. The right  internal jugular venous catheter tip projects over the midportion of the SVC.  IMPRESSION: Improved appearance of the pulmonary interstitium and decreased left pleural effusion. The support tubes are stable.   Electronically Signed   By: David  Martinique M.D.   On: 02/19/2015 07:52   Dg Chest Port 1 View  02/18/2015   CLINICAL DATA:  coronary artery bypass grafting.  EXAM: PORTABLE CHEST - 1 VIEW  COMPARISON:  02/17/2015.  FINDINGS: Support apparatus: Interval extubation. Removal of enteric tube. Mediastinal drain remains present. RIGHT IJ central line remains present. The Swan-Ganz catheter has been removed. LEFT subclavian vascular sheath remains present. LEFT thoracostomy tube remains present. Monitoring leads project over the chest.  Cardiomediastinal Silhouette: CABG. Mildly enlarged. Size accentuated by lower volumes on today's exam.  Lungs: Basilar atelectasis, expected consequence of extubation. No pneumothorax.  Effusions:  None visible.  Other:  None.  IMPRESSION: 1. Interval extubation with expected lower lung volumes. 2. Mild basilar atelectasis. 3. Accentuation of the cardiopericardial silhouette which appears mildly enlarged, likely due to the lower lung volumes.   Electronically Signed   By: Dereck Ligas M.D.   On: 02/18/2015 07:53   Dg Chest Port 1 View  02/17/2015   CLINICAL DATA:  Status post coronary artery bypass graft x5.  EXAM: PORTABLE CHEST - 1 VIEW  COMPARISON:  February 16, 2015.  FINDINGS: Status post coronary artery bypass graft. Endotracheal tube is in grossly good position with distal tip 5.5 cm above the carina. Nasogastric tube is seen entering the stomach. Left subclavian Swan-Ganz catheter is noted with distal tip in the right pulmonary artery. Right internal jugular catheter is noted with distal tip in expected position of the SVC. Left-sided chest tube is noted without pneumothorax. Mild central pulmonary vascular congestion is noted.  IMPRESSION: No pneumothorax seen status  post coronary artery bypass graft. Endotracheal and nasogastric tubes in grossly good position.   Electronically Signed   By: Marijo Conception, M.D.   On: 02/17/2015 14:01   Dg Chest Port 1 View  02/13/2015   CLINICAL DATA:  Acute respiratory failure.  EXAM: PORTABLE CHEST - 1 VIEW  COMPARISON:  02/12/2015.  FINDINGS: Interim removal of endotracheal tube and NG tube. Right IJ line in stable position. Cardiomegaly with bilateral pulmonary interstitial infiltrates, right side greater than  left suggesting persistent congestive heart failure. Pneumonia cannot be excluded. Low lung volumes with bibasilar atelectasis. Small right pleural effusion cannot be excluded. No pneumothorax.  IMPRESSION: 1. Interim removal of endotracheal tube and NG tube. Right IJ line in stable position. 2. Cardiomegaly with persist bilateral pulmonary interstitial infiltrates, right side greater than left. Findings suggest congestive heart failure pulmonary interstitial edema. Pneumonia cannot be excluded. Associated small right pleural effusion. 3. Low lung volumes with bibasilar atelectasis.   Electronically Signed   By: Marcello Moores  Register   On: 02/13/2015 07:43   Dg Chest Port 1 View  02/12/2015   CLINICAL DATA:  Hypoxia  EXAM: PORTABLE CHEST - 1 VIEW  COMPARISON:  February 11, 2015  FINDINGS: Endotracheal tube tip is 4.6 cm above the carina. Nasogastric tube tip and side port are below the diaphragm. Central catheter tip is in the superior vena cava. No pneumothorax. There is patchy interstitial and alveolar opacity throughout the right mid and lower lung zones, stable. There is patchy interstitial opacity in the left lower lobe. The left lung is otherwise clear. Heart is upper normal in size with pulmonary vascularity within normal limits. No adenopathy.  IMPRESSION: Areas of primarily interstitial opacity in both mid and lower lung zones with patchy airspace disease on the right in these areas. These areas appear stable compared to 1 day  prior. No new opacity. No change in cardiac silhouette. Tube and catheter positions as described without pneumothorax. The appearance suggests a degree of congestive heart failure, although superimposed pneumonia may well be present as well.   Electronically Signed   By: Lowella Grip III M.D.   On: 02/12/2015 07:42   Dg Chest Port 1 View  02/11/2015   CLINICAL DATA:  Acute respiratory failure  EXAM: PORTABLE CHEST - 1 VIEW  COMPARISON:  02/11/2015  FINDINGS: Endotracheal tube in good position. NG tube in the stomach. Right jugular catheter tip in the SVC unchanged. No pneumothorax  Bilateral airspace disease with mild interval improvement. No significant effusion.  IMPRESSION: Support lines in good position.  Diffuse bilateral airspace disease with mild interval improvement.   Electronically Signed   By: Franchot Gallo M.D.   On: 02/11/2015 15:08   Dg Chest Port 1 View  02/11/2015   CLINICAL DATA:  Central line placement.  Initial encounter.  EXAM: PORTABLE CHEST - 1 VIEW  COMPARISON:  Chest radiograph performed 02/10/2015  FINDINGS: The patient's endotracheal tube is seen ending 4 cm above the carina. A right IJ line is noted ending about the mid SVC. An enteric tube is noted extending below the diaphragm.  Worsening bibasilar and right mid lung airspace opacities raise concern for worsening pulmonary edema or multifocal pneumonia. No definite pleural effusion or pneumothorax is seen.  The cardiomediastinal silhouette is borderline normal in size. No acute osseous abnormalities are identified. An external pacing pad is noted.  IMPRESSION: 1. Endotracheal tube seen ending 4 cm above the carina. 2. Right IJ line noted ending about the mid SVC. 3. Worsening bibasilar and right mid lung zone airspace opacities raise concern for worsening pulmonary edema or multifocal pneumonia.   Electronically Signed   By: Garald Balding M.D.   On: 02/11/2015 03:46   Portable Chest Xray  02/10/2015   CLINICAL DATA:   Acute respiratory failure  EXAM: PORTABLE CHEST - 1 VIEW  COMPARISON:  02/10/2015  FINDINGS: ET tube tip is above the carina. The heart size appears normal. Nasogastric tube tip is in the stomach. Pulmonary edema  pattern is improved from previous exam. Persistent lower lobe interstitial and airspace opacities identified.  IMPRESSION: 1. Interval improvement in pulmonary edema pattern.   Electronically Signed   By: Kerby Moors M.D.   On: 02/10/2015 21:05   Dg Chest Port 1 View  02/10/2015   CLINICAL DATA:  Sudden onset shortness of breath and respiratory distress  EXAM: PORTABLE CHEST - 1 VIEW  COMPARISON:  The 02/06/2015  FINDINGS: Symmetric alveolar and interstitial opacities. There could be small pleural effusions, limited at the right base due to exclusion from view.  Stable mild cardiomegaly. Vascular pedicle widening, increased from previous.  IMPRESSION: CHF with widespread alveolar edema.   Electronically Signed   By: Monte Fantasia M.D.   On: 02/10/2015 18:19   Dg Chest Portable 1 View  02/06/2015   CLINICAL DATA:  Chest pain radiating to both arms beginning today  EXAM: PORTABLE CHEST - 1 VIEW  COMPARISON:  03/20/2012  FINDINGS: Mild enlargement of the cardiomediastinal silhouette with central vascular congestion reidentified. A few interstitial Kerley B-lines are noted at the lung bases which could indicate interstitial edema. No focal pulmonary opacity allowing for technique. No pleural effusion. No acute osseous finding.  IMPRESSION: Mild enlargement of the cardiomediastinal silhouette with suggestion of early interstitial edema. If symptoms persist, consider PA and lateral chest radiographs obtained at full inspiration when the patient is clinically able.   Electronically Signed   By: Conchita Paris M.D.   On: 02/06/2015 16:45   Dg Chest Port 1v Same Day  02/16/2015   CLINICAL DATA:  CHF  EXAM: PORTABLE CHEST - 1 VIEW SAME DAY  COMPARISON:  02/14/2015  FINDINGS: Right central line remains  in place, unchanged. Heart is borderline in size. Vascular congestion and interstitial prominence, slightly improved since prior study. No confluent opacity or visible effusions. No acute bony abnormality.  IMPRESSION: Slight improvement in pulmonary edema pattern.   Electronically Signed   By: Rolm Baptise M.D.   On: 02/16/2015 10:00   Dg Abd 2 Views  02/06/2015   CLINICAL DATA:  81 year old male with generalized abdominal pain and chest pain radiating to the arms since 1330 hrs. Initial encounter.  EXAM: ABDOMEN - 2 VIEW  COMPARISON:  Portable chest radiograph 1620 hr today. Lumbar MRI 10/21/2009.  FINDINGS: Upright and supine views of the abdomen and pelvis. Moderate gaseous distension of the stomach. Mild elevation of the left hemidiaphragm. No pneumoperitoneum.  Gas-filled but nondilated small and large bowel loops throughout the abdomen and pelvis. Non obstructed pattern. Osteopenia. Postoperative changes to the proximal right femur. No definite acute osseous abnormality.  IMPRESSION: 1. Gaseous distension of the stomach, and gas throughout nondilated small and large bowel loops. The appearance might reflect ileus but does not suggest a mechanical bowel obstruction. 2. No free air.   Electronically Signed   By: Genevie Ann M.D.   On: 02/06/2015 19:52   Dg Abd Portable 1v  02/08/2015   CLINICAL DATA:  Abdominal distention.  EXAM: PORTABLE ABDOMEN - 1 VIEW  COMPARISON:  February 06, 2015.  FINDINGS: Mild gaseous distention of the stomach is again noted. Stool is noted in the right colon. No abnormal dilatation of large or small bowel is seen on this study. Stool is noted in the rectum. No abnormal calcifications are noted. Status post right hip surgery.  IMPRESSION: No evidence of bowel obstruction or ileus. Mild gastric distention of the stomach is again noted.   Electronically Signed   By: Marijo Conception,  M.D.   On: 02/08/2015 10:49     CBC  Recent Labs Lab 03/02/15 0630 03/02/15 1430 03/03/15 0148    WBC 9.0 9.9 9.4  HGB 9.7* 10.0* 9.8*  HCT 31.1* 30.8* 30.7*  PLT 359 345 346  MCV 93.1 92.5 91.6  MCH 29.0 30.0 29.3  MCHC 31.2 32.5 31.9  RDW 13.9 14.1 14.0  LYMPHSABS  --  1.0  --   MONOABS  --  0.9  --   EOSABS  --  0.5  --   BASOSABS  --  0.1  --     Chemistries   Recent Labs Lab 03/02/15 0630 03/02/15 1430  NA 142 140  K 4.4 5.2*  CL 105 103  CO2 29 30  GLUCOSE 52* 157*  BUN 36* 38*  CREATININE 1.53* 1.53*  CALCIUM 9.9 9.6   ------------------------------------------------------------------------------------------------------------------ estimated creatinine clearance is 43 mL/min (by C-G formula based on Cr of 1.53). ------------------------------------------------------------------------------------------------------------------ No results for input(s): HGBA1C in the last 72 hours. ------------------------------------------------------------------------------------------------------------------ No results for input(s): CHOL, HDL, LDLCALC, TRIG, CHOLHDL, LDLDIRECT in the last 72 hours. ------------------------------------------------------------------------------------------------------------------ No results for input(s): TSH, T4TOTAL, T3FREE, THYROIDAB in the last 72 hours.  Invalid input(s): FREET3 ------------------------------------------------------------------------------------------------------------------ No results for input(s): VITAMINB12, FOLATE, FERRITIN, TIBC, IRON, RETICCTPCT in the last 72 hours.  Coagulation profile  Recent Labs Lab 03/02/15 1430  INR 1.23    No results for input(s): DDIMER in the last 72 hours.  Cardiac Enzymes  Recent Labs Lab 03/02/15 1719 03/02/15 2000 03/02/15 2255  TROPONINI 0.09* 0.10* 0.11*   ------------------------------------------------------------------------------------------------------------------ Invalid input(s): POCBNP     Time Spent in minutes   30 minutes   Elinora Weigand M.D on  03/03/2015 at 11:57 AM  Between 7am to 7pm - Pager - [phone]  After 7pm go to [email or web address] - password [facility]  [facility]   Office  [phone]

## 2015-03-03 NOTE — Progress Notes (Signed)
[place] for Heparin Indication: pulmonary embolus  Allergies  Allergen Reactions  . Bactrim [Sulfamethoxazole-Trimethoprim] Rash    Patient Measurements: Height: 5' 9.5" (176.5 cm) Weight: 197 lb 8.5 oz (89.6 kg) IBW/kg (Calculated) : 71.85 Heparin Dosing Weight: 90.5 kg  Vital Signs: Temp: 98.5 F (36.9 C) (07/12 1518) Temp Source: Oral (07/12 1518) BP: 121/56 mmHg (07/12 1518) Pulse Rate: 70 (07/12 1518)  Labs:  Recent Labs  03/02/15 0630  03/02/15 1430 03/02/15 1719 03/02/15 2000 03/02/15 2255 03/03/15 0148 03/03/15 1653 03/03/15 2024  HGB 9.7*  --  10.0*  --   --   --  9.8*  --   --   HCT 31.1*  --  30.8*  --   --   --  30.7*  --   --   PLT 359  --  345  --   --   --  346  --   --   LABPROT  --   --  15.6*  --   --   --   --   --   --   INR  --   --  1.23  --   --   --   --   --   --   HEPARINUNFRC  --   --   --   --   --   --  0.45 0.10* 0.19*  CREATININE 1.53*  --  1.53*  --   --   --   --   --   --   TROPONINI  --   < > 0.09* 0.09* 0.10* 0.11*  --   --   --   < > = values in this interval not displayed.  Estimated Creatinine Clearance: 43 mL/min (by C-G formula based on Cr of 1.53).   Medical History: Past Medical History  Diagnosis Date  . Hypertension   . High potassium   . Varicose veins   . Hyperlipidemia   . Basal cell carcinoma of forehead 2016 X 2  . Basal cell carcinoma of left earlobe   . Old myocardial infarct     "sometime in the past; don't know when" (02/07/2015)  . Type II diabetes mellitus   . Pneumonia X 1  . GERD (gastroesophageal reflux disease)   . Arthritis     "legs" (02/07/2015)  . History of gout X 1  . Diabetic peripheral neuropathy     "left foot" (02/07/2015)  . Respiratory failure   . Pain and swelling of left lower leg     chronic    Medications:  Prescriptions prior to admission  Medication Sig Dispense Refill Last Dose  . aspirin EC 81 MG tablet Take 81 mg by mouth every  morning.   03/02/2015 at [place]  . atorvastatin (LIPITOR) 20 MG tablet TAKE 1 TABLET BY MOUTH DAILY AT 6:00 P.M. ( MUST SCHEDULE AN APPOINTMENT FOR FUTURE REFILLS ) 30 tablet 0 03/01/2015 at 1600  . cloNIDine (CATAPRES) 0.2 MG tablet Take 0.2 mg by mouth 2 (two) times daily.   03/02/2015 at [place]  . ferrous sulfate 325 (65 FE) MG tablet Take 1 tablet (325 mg total) by mouth daily with breakfast.  3 03/02/2015 at [facility]  . furosemide (LASIX) 40 MG tablet Take 1 tablet (40 mg total) by mouth daily. (Patient taking differently: Take 60 mg by mouth daily. ) 30 tablet  03/02/2015 at [facility]  . gabapentin (NEURONTIN) 100 MG capsule Take 100 mg by mouth 3 (three)  times daily.    03/02/2015 at [place]  . LEVEMIR FLEXTOUCH 100 UNIT/ML Pen Inject 36 Units into the skin at bedtime.    03/01/2015 at 2000  . LORazepam (ATIVAN) 1 MG tablet Take 1 mg by mouth at bedtime as needed for sleep.   unknown  . metoprolol tartrate (LOPRESSOR) 25 MG tablet Take 1 tablet (25 mg total) by mouth 2 (two) times daily.   03/02/2015 at [facility]  . omeprazole (PRILOSEC) 20 MG capsule Take 20 mg by mouth daily.    03/01/2015 at 2100  . potassium chloride SA (K-DUR,KLOR-CON) 10 MEQ tablet Take 1 tablet (10 mEq total) by mouth daily.   03/02/2015 at [facility]  . traMADol (ULTRAM) 50 MG tablet Take 1 tablet (50 mg total) by mouth every 6 (six) hours as needed for moderate pain. 30 tablet 0 unknown    Assessment: 81 year old man who had CABG approximately 2 weeks ago now on IV Heparin per pharmacy for PE  Heparin level below goal   Goal of Therapy:  Heparin level 0.3-0.7 units/ml Monitor platelets by anticoagulation protocol: Yes   Plan:   Increase Heparin to 1550 units/hr Transition to apixaban in AM. No Further Heparin Levels will be ordered as heparin drip will stop in the morning.  Pricilla Larsson, [facility] 03/03/2015 9:00 PM

## 2015-03-03 NOTE — Progress Notes (Signed)
CRITICAL VALUE ALERT  Critical value received:  CBG  59  Date of notification:  03/03/2015  Time of notification:  [id number]  Critical value read back:Yes.    Nurse who received alert:  Radene Gunning   MD notified (1st page):  Dr Waldron Labs  Time of first page:  [phone]  MD notified (2nd page):  Time of second page:  Responding MD:  Dr Waldron Labs  Time MD responded:  [phone]

## 2015-03-03 NOTE — Progress Notes (Addendum)
[place] for Heparin Indication: pulmonary embolus  Allergies  Allergen Reactions  . Bactrim [Sulfamethoxazole-Trimethoprim] Rash    Patient Measurements: Height: 5' 9.5" (176.5 cm) Weight: 197 lb 8.5 oz (89.6 kg) IBW/kg (Calculated) : 71.85 Heparin Dosing Weight: 90.5 kg  Vital Signs: Temp: 98.1 F (36.7 C) (07/12 0536) Temp Source: Oral (07/12 0536) BP: 147/72 mmHg (07/12 0856) Pulse Rate: 103 (07/12 0856)  Labs:  Recent Labs  03/02/15 0630  03/02/15 1430 03/02/15 1719 03/02/15 2000 03/02/15 2255 03/03/15 0148  HGB 9.7*  --  10.0*  --   --   --  9.8*  HCT 31.1*  --  30.8*  --   --   --  30.7*  PLT 359  --  345  --   --   --  346  LABPROT  --   --  15.6*  --   --   --   --   INR  --   --  1.23  --   --   --   --   HEPARINUNFRC  --   --   --   --   --   --  0.45  CREATININE 1.53*  --  1.53*  --   --   --   --   TROPONINI  --   < > 0.09* 0.09* 0.10* 0.11*  --   < > = values in this interval not displayed.  Estimated Creatinine Clearance: 43 mL/min (by C-G formula based on Cr of 1.53).   Medical History: Past Medical History  Diagnosis Date  . Hypertension   . High potassium   . Varicose veins   . Hyperlipidemia   . Basal cell carcinoma of forehead 2016 X 2  . Basal cell carcinoma of left earlobe   . Old myocardial infarct     "sometime in the past; don't know when" (02/07/2015)  . Type II diabetes mellitus   . Pneumonia X 1  . GERD (gastroesophageal reflux disease)   . Arthritis     "legs" (02/07/2015)  . History of gout X 1  . Diabetic peripheral neuropathy     "left foot" (02/07/2015)  . Respiratory failure   . Pain and swelling of left lower leg     chronic    Medications:  Prescriptions prior to admission  Medication Sig Dispense Refill Last Dose  . aspirin EC 81 MG tablet Take 81 mg by mouth every morning.   03/02/2015 at [place]  . atorvastatin (LIPITOR) 20 MG tablet TAKE 1 TABLET BY MOUTH DAILY AT 6:00 P.M.  ( MUST SCHEDULE AN APPOINTMENT FOR FUTURE REFILLS ) 30 tablet 0 03/01/2015 at 1600  . cloNIDine (CATAPRES) 0.2 MG tablet Take 0.2 mg by mouth 2 (two) times daily.   03/02/2015 at [place]  . ferrous sulfate 325 (65 FE) MG tablet Take 1 tablet (325 mg total) by mouth daily with breakfast.  3 03/02/2015 at [facility]  . furosemide (LASIX) 40 MG tablet Take 1 tablet (40 mg total) by mouth daily. (Patient taking differently: Take 60 mg by mouth daily. ) 30 tablet  03/02/2015 at [facility]  . gabapentin (NEURONTIN) 100 MG capsule Take 100 mg by mouth 3 (three) times daily.    03/02/2015 at [place]  . LEVEMIR FLEXTOUCH 100 UNIT/ML Pen Inject 36 Units into the skin at bedtime.    03/01/2015 at 2000  . LORazepam (ATIVAN) 1 MG tablet Take 1 mg by mouth at bedtime as needed for sleep.  unknown  . metoprolol tartrate (LOPRESSOR) 25 MG tablet Take 1 tablet (25 mg total) by mouth 2 (two) times daily.   03/02/2015 at [facility]  . omeprazole (PRILOSEC) 20 MG capsule Take 20 mg by mouth daily.    03/01/2015 at 2100  . potassium chloride SA (K-DUR,KLOR-CON) 10 MEQ tablet Take 1 tablet (10 mEq total) by mouth daily.   03/02/2015 at 900[facility]  . traMADol (ULTRAM) 50 MG tablet Take 1 tablet (50 mg total) by mouth every 6 (six) hours as needed for moderate pain. 30 tablet 0 unknown    Assessment: 81 year old man who had CABG approximately 2 weeks ago now on IV Heparin per pharmacy for PE  Heparin level at goal   Goal of Therapy:  Heparin level 0.3-0.7 units/ml Monitor platelets by anticoagulation protocol: Yes   Plan:  Continue heparin infusion at 1400 units/hr Check anti-Xa level daily while on heparin Continue to monitor H&H and platelets  Pricilla Larsson 03/03/2015,11:08 AM  Addendum: Heparin stopped and restarted s/p thoracentesis. Will repeat Heparin level 6-8 hours after restart. Transition to apixaban in AM.  Pricilla Larsson, [facility] 03/03/2015 2:52 PM

## 2015-03-03 NOTE — Progress Notes (Signed)
Thoracentesis complete no signs of distress. 1000 ml amber colored pleural fluid removed.

## 2015-03-03 NOTE — Clinical Social Work Note (Signed)
Clinical Social Work Assessment  Patient Details  Name: Zachary Mcmahon MRN: [id number] Date of Birth: 12/15/1931  Date of referral:  03/03/15               Reason for consult:  Facility Placement                Permission sought to share information with:    Permission granted to share information::     Name::        Agency::     Relationship::     Contact Information:     Housing/Transportation Living arrangements for the past 2 months:  [place] of Information:  Patient Patient Interpreter Needed:  None Criminal Activity/Legal Involvement Pertinent to Current Situation/Hospitalization:  No - Comment as needed Significant Relationships:  Adult Children, Spouse Lives with:  Facility Resident Do you feel safe going back to the place where you live?  Yes Need for family participation in patient care:  Yes (Comment)  Care giving concerns:  Pt has been at [facility] for about a week.    Social Worker assessment / plan:  CSW met with pt at bedside. Pt alert and oriented and very pleasant. He reports that he has been a resident at [facility] for about a week after CABG. He shared that his wife has dementia and has around the clock caregiver. He is anxious to return home as soon as he can as she has missed him being there. Pt states that he would also have around the clock care as needed. He feels that it would be best to return to [facility] for a short while before transitioning to home. Pt has had a great experience at [facility] and states, "I love that place." Per Kerri at facility, pt is okay to return and no FL2 needed.   Employment status:  Retired Nurse, adult PT Recommendations:  Not assessed at this time [place] / Referral to community resources:  Return to [facility].   Patient/Family's Response to care:  Pt is hopeful to return home very soon, but plans to return to [facility] at d/c.   Patient/Family's Understanding of and Emotional Response to Diagnosis,  Current Treatment, and Prognosis:  Pt shared everything that has happened in the past month medically and appears to have a good understanding.   Emotional Assessment Appearance:  Appears stated age Attitude/Demeanor/Rapport:  Other (cooperative) Affect (typically observed):  Pleasant Orientation:  Oriented to Self, Oriented to Place, Oriented to  Time, Oriented to Situation Alcohol / Substance use:  Not Applicable Psych involvement (Current and /or in the community):  No (Comment)  Discharge Needs  Concerns to be addressed:  Discharge Planning Concerns Readmission within the last 30 days:  Yes Current discharge risk:  None Barriers to Discharge:  Continued Medical Work up   [employer], [employer], [place] 03/03/2015, 11:13 AM [phone]

## 2015-03-04 ENCOUNTER — Observation Stay (HOSPITAL_COMMUNITY): Payer: Medicare Other

## 2015-03-04 DIAGNOSIS — N183 Chronic kidney disease, stage 3 (moderate): Secondary | ICD-10-CM | POA: Diagnosis not present

## 2015-03-04 DIAGNOSIS — Z7901 Long term (current) use of anticoagulants: Secondary | ICD-10-CM | POA: Diagnosis not present

## 2015-03-04 DIAGNOSIS — I2510 Atherosclerotic heart disease of native coronary artery without angina pectoris: Secondary | ICD-10-CM | POA: Diagnosis present

## 2015-03-04 DIAGNOSIS — R278 Other lack of coordination: Secondary | ICD-10-CM | POA: Diagnosis not present

## 2015-03-04 DIAGNOSIS — I214 Non-ST elevation (NSTEMI) myocardial infarction: Secondary | ICD-10-CM | POA: Diagnosis present

## 2015-03-04 DIAGNOSIS — Z48812 Encounter for surgical aftercare following surgery on the circulatory system: Secondary | ICD-10-CM | POA: Diagnosis not present

## 2015-03-04 DIAGNOSIS — I2699 Other pulmonary embolism without acute cor pulmonale: Secondary | ICD-10-CM | POA: Diagnosis not present

## 2015-03-04 DIAGNOSIS — M6281 Muscle weakness (generalized): Secondary | ICD-10-CM | POA: Diagnosis not present

## 2015-03-04 DIAGNOSIS — E119 Type 2 diabetes mellitus without complications: Secondary | ICD-10-CM | POA: Diagnosis not present

## 2015-03-04 DIAGNOSIS — I129 Hypertensive chronic kidney disease with stage 1 through stage 4 chronic kidney disease, or unspecified chronic kidney disease: Secondary | ICD-10-CM | POA: Diagnosis present

## 2015-03-04 DIAGNOSIS — Z8249 Family history of ischemic heart disease and other diseases of the circulatory system: Secondary | ICD-10-CM | POA: Diagnosis not present

## 2015-03-04 DIAGNOSIS — M7989 Other specified soft tissue disorders: Secondary | ICD-10-CM | POA: Diagnosis not present

## 2015-03-04 DIAGNOSIS — J90 Pleural effusion, not elsewhere classified: Secondary | ICD-10-CM | POA: Diagnosis present

## 2015-03-04 DIAGNOSIS — I10 Essential (primary) hypertension: Secondary | ICD-10-CM | POA: Diagnosis not present

## 2015-03-04 DIAGNOSIS — R600 Localized edema: Secondary | ICD-10-CM | POA: Diagnosis not present

## 2015-03-04 DIAGNOSIS — Z85828 Personal history of other malignant neoplasm of skin: Secondary | ICD-10-CM | POA: Diagnosis not present

## 2015-03-04 DIAGNOSIS — E785 Hyperlipidemia, unspecified: Secondary | ICD-10-CM | POA: Diagnosis not present

## 2015-03-04 DIAGNOSIS — N184 Chronic kidney disease, stage 4 (severe): Secondary | ICD-10-CM | POA: Diagnosis present

## 2015-03-04 DIAGNOSIS — I8393 Asymptomatic varicose veins of bilateral lower extremities: Secondary | ICD-10-CM | POA: Diagnosis not present

## 2015-03-04 DIAGNOSIS — E1122 Type 2 diabetes mellitus with diabetic chronic kidney disease: Secondary | ICD-10-CM | POA: Diagnosis present

## 2015-03-04 DIAGNOSIS — Z9981 Dependence on supplemental oxygen: Secondary | ICD-10-CM | POA: Diagnosis not present

## 2015-03-04 DIAGNOSIS — Z833 Family history of diabetes mellitus: Secondary | ICD-10-CM | POA: Diagnosis not present

## 2015-03-04 DIAGNOSIS — R262 Difficulty in walking, not elsewhere classified: Secondary | ICD-10-CM | POA: Diagnosis not present

## 2015-03-04 DIAGNOSIS — K219 Gastro-esophageal reflux disease without esophagitis: Secondary | ICD-10-CM | POA: Diagnosis present

## 2015-03-04 DIAGNOSIS — Z951 Presence of aortocoronary bypass graft: Secondary | ICD-10-CM | POA: Diagnosis not present

## 2015-03-04 DIAGNOSIS — E1142 Type 2 diabetes mellitus with diabetic polyneuropathy: Secondary | ICD-10-CM | POA: Diagnosis present

## 2015-03-04 DIAGNOSIS — R079 Chest pain, unspecified: Secondary | ICD-10-CM | POA: Diagnosis not present

## 2015-03-04 LAB — GLUCOSE, CAPILLARY
GLUCOSE-CAPILLARY: 162 mg/dL — AB (ref 65–99)
GLUCOSE-CAPILLARY: 138 mg/dL — AB (ref 65–99)
Glucose-Capillary: 147 mg/dL — ABNORMAL HIGH (ref 65–99)
Glucose-Capillary: 177 mg/dL — ABNORMAL HIGH (ref 65–99)

## 2015-03-04 LAB — BASIC METABOLIC PANEL
Anion gap: 7 (ref 5–15)
BUN: 34 mg/dL — ABNORMAL HIGH (ref 6–20)
CO2: 28 mmol/L (ref 22–32)
CREATININE: 1.83 mg/dL — AB (ref 0.61–1.24)
Calcium: 9.1 mg/dL (ref 8.9–10.3)
Chloride: 105 mmol/L (ref 101–111)
GFR calc Af Amer: 38 mL/min — ABNORMAL LOW (ref >60)
GFR, EST NON AFRICAN AMERICAN: 33 mL/min — AB (ref >60)
Glucose, Bld: 164 mg/dL — ABNORMAL HIGH (ref 65–99)
Potassium: 4.1 mmol/L (ref 3.5–5.1)
Sodium: 140 mmol/L (ref 135–145)

## 2015-03-04 LAB — CBC
HEMATOCRIT: 31.9 % — AB (ref 39.0–52.0)
Hemoglobin: 10.1 g/dL — ABNORMAL LOW (ref 13.0–17.0)
MCH: 29.7 pg (ref 26.0–34.0)
MCHC: 31.7 g/dL (ref 30.0–36.0)
MCV: 93.8 fL (ref 78.0–100.0)
Platelets: 320 10*3/uL (ref 150–400)
RBC: 3.4 MIL/uL — ABNORMAL LOW (ref 4.22–5.81)
RDW: 14.3 % (ref 11.5–15.5)
WBC: 8.8 10*3/uL (ref 4.0–10.5)

## 2015-03-04 LAB — PH, BODY FLUID: PH, FLUID: 8

## 2015-03-04 LAB — PATHOLOGIST SMEAR REVIEW

## 2015-03-04 MED ORDER — ATORVASTATIN CALCIUM 40 MG PO TABS
80.0000 mg | ORAL_TABLET | Freq: Every day | ORAL | Status: DC
  Administered 2015-03-04: 80 mg via ORAL
  Filled 2015-03-04: qty 2

## 2015-03-04 NOTE — Consult Note (Signed)
Reason for Consult:positive troponin Referring Physician: [facility] Cardiologist: Dr. Harl Bowie consulting, will need to be followed in [place] office after D/C  Zachary Mcmahon is an 81 y.o. male.  HPI: This is an 81 yr old male patient who had a recent NSTEMI and then underwent CABG  X 5 02/17/15 by Dr. Servando Snare with a LIMA to LAD, SVG-DX, Seq SVG-OM1 and PLB, and SVG-PDA. He also has CRD stage IV, HTN, Severe GERD, chronic diastolic CHF.   Patient was discharged to the [facility] and doing well. Yesterday while walking with rehab he was asked to walk faster and developed sharp shooting left abdominal pain, a little shortness of breath and diaphoresis. They tried to have him do a bike and symptoms didn't improve so he was brought to ER. Troponins 0.10, 0.11, 0.09, 0.09. EKG:NSR with anterolateral Twave inversion unchanged from last EKG. CT positive for acute pulmonary embolus. Also underwent thoracentesis for large left pleural effusion and drew off 1000 cc.Dopplers negative for DVT. EF on echo yesterday moderate LVH EF 50-55%. Improved from intraop TEE EF 35-40%.  He is now feeling much better. Denies any chest pain through any of this and minimal dyspnea.  Past Medical History  Diagnosis Date  . Hypertension   . High potassium   . Varicose veins   . Hyperlipidemia   . Basal cell carcinoma of forehead 2016 X 2  . Basal cell carcinoma of left earlobe   . Old myocardial infarct     "sometime in the past; don't know when" (02/07/2015)  . Type II diabetes mellitus   . Pneumonia X 1  . GERD (gastroesophageal reflux disease)   . Arthritis     "legs" (02/07/2015)  . History of gout X 1  . Diabetic peripheral neuropathy     "left foot" (02/07/2015)  . Respiratory failure   . Pain and swelling of left lower leg     chronic    Past Surgical History  Procedure Laterality Date  . Knee arthroscopy Right ~ 2008  . Orif hip fracture  03/20/2012    Procedure: OPEN REDUCTION INTERNAL FIXATION HIP;  Surgeon:  Sanjuana Kava, MD;  Location: [facility];  Service: Orthopedics;  Laterality: Right;  . Lower extremity venous doppler  07/11/2012    No evidence of DVT in the left lower extremity. Evidence of partially recanalized, chronic, non-obstructive thrombus in the left great SV and its branches consistent with significant reflux consistent with post-phlebitic syndrome. Significant reflux of the left short saphenous vein.  . Cardiovascular stress test  05/29/2012    Mild-moderate perfusion defect due to infarct/scar with mild-moderate perinfarct ischemia seen in the mid anterior, apical anterior, apical septal, and apical regions. No ECG changes. Global LV systolic function is severely reduced.  . Transthoracic echocardiogram  04/18/2012    EF 45%, mild-moderate LVH  . Transthoracic echocardiogram  04/18/12    EF% 45%.SEVERE HYPOKINESIS TO AKINESIS OF THE MID-DISTAL INFEROLATERAL MYOCARDIUM AND MUCH OF THE APEX.  Marland Kitchen Lexiscam myocardial perfusion  05/29/12    MARKED PERFUSION DEFECT DUE TO INFARC/SCAR WITH MILD PERINFARCT ISCHEMIA IN THE BASAL INFERIOR, MID INFEROSEPTAL, MID INFERIOR AND APICALINFERIOR REGION. EF%33%. PERIINFARCT ISCHEMIA IN THE MID ANTERIOR, APICAL ANTERIOR, APICAL SEPTAL AND APICAL REGIONS.  . Fracture surgery    . Cystoscopy w/ stone manipulation  X 1  . Basal cell carcinoma excision      "probably 1/2 dozen cut off face, left ear" (02/07/2015)  . Cardiac catheterization N/A 02/09/2015    Procedure: Left Heart  Cath and Coronary Angiography;  Surgeon: Leonie Man, MD;  Location: [place] [facility];  Service: Cardiovascular;  Laterality: N/A;  . Coronary artery bypass graft N/A 02/17/2015    Procedure: CORONARY ARTERY BYPASS GRAFTING (CABG), ON PUMP, TIMES FIVE, USING LEFT INTERNAL MAMMARY ARTERY, RIGHT GREATER SAPHENOUS VEIN HARVESTED ENDOSCOPICALLY;  Surgeon: [name], MD;  Location: [place];  Service: Open Heart Surgery;  Laterality: N/A;  -LIMA to LAD -SVG to DIAGONAL - SEQ SVG to OM1  and PLB -SVG to PDA  . Tee without cardioversion N/A 02/17/2015    Procedure: TRANSESOPHAGEAL ECHOCARDIOGRAM (TEE);  Surgeon: [name], MD;  Location: [place];  Service: Open Heart Surgery;  Laterality: N/A;  . Open heart sx  02/17/15    Family History  Problem Relation Age of Onset  . Deep vein thrombosis Mother   . Hypertension Mother   . Hypertension Sister   . Diabetes Brother   . Hyperlipidemia Brother   . Hypertension Brother   . Heart attack Brother     Social History:  reports that he has never smoked. He has never used smokeless tobacco. He reports that he does not drink alcohol or use illicit drugs.  Allergies:  Allergies  Allergen Reactions  . Bactrim [Sulfamethoxazole-Trimethoprim] Rash    Medications:  Scheduled Meds: . apixaban  10 mg Oral BID   Followed by  . [START ON 03/11/2015] apixaban  5 mg Oral BID  . aspirin EC  81 mg Oral q morning - 10a  . atorvastatin  20 mg Oral q1800  . cloNIDine  0.2 mg Oral BID  . ferrous sulfate  325 mg Oral Q breakfast  . furosemide  40 mg Oral Daily  . gabapentin  100 mg Oral TID  . insulin aspart  0-15 Units Subcutaneous TID WC  . insulin detemir  30 Units Subcutaneous QHS  . metoprolol tartrate  25 mg Oral BID  . pantoprazole  40 mg Oral Daily  . potassium chloride SA  10 mEq Oral Daily   Continuous Infusions:  PRN Meds:.acetaminophen, ondansetron (ZOFRAN) IV, traMADol   Results for orders placed or performed during the hospital encounter of 03/02/15 (from the past 48 hour(s))  Basic metabolic panel     Status: Abnormal   Collection Time: 03/02/15  2:30 PM  Result Value Ref Range   Sodium 140 135 - 145 mmol/L   Potassium 5.2 (H) 3.5 - 5.1 mmol/L   Chloride 103 101 - 111 mmol/L   CO2 30 22 - 32 mmol/L   Glucose, Bld 157 (H) 65 - 99 mg/dL   BUN 38 (H) 6 - 20 mg/dL   Creatinine, Ser 1.53 (H) 0.61 - 1.24 mg/dL   Calcium 9.6 8.9 - 10.3 mg/dL   GFR calc non Af Amer 41 (L) >60 mL/min   GFR calc Af Amer 48 (L)  >60 mL/min    Comment: (NOTE) The eGFR has been calculated using the CKD EPI equation. This calculation has not been validated in all clinical situations. eGFR's persistently <60 mL/min signify possible Chronic Kidney Disease.    Anion gap 7 5 - 15  Troponin I     Status: Abnormal   Collection Time: 03/02/15  2:30 PM  Result Value Ref Range   Troponin I 0.09 (H) <0.031 ng/mL    Comment:        PERSISTENTLY INCREASED TROPONIN VALUES IN THE RANGE OF 0.04-0.49 ng/mL CAN BE SEEN IN:       -UNSTABLE ANGINA       -  CONGESTIVE HEART FAILURE       -MYOCARDITIS       -CHEST TRAUMA       -ARRYHTHMIAS       -LATE PRESENTING MYOCARDIAL INFARCTION       -COPD   CLINICAL FOLLOW-UP RECOMMENDED.   CBC with Differential     Status: Abnormal   Collection Time: 03/02/15  2:30 PM  Result Value Ref Range   WBC 9.9 4.0 - 10.5 K/uL   RBC 3.33 (L) 4.22 - 5.81 MIL/uL   Hemoglobin 10.0 (L) 13.0 - 17.0 g/dL   HCT 30.8 (L) 39.0 - 52.0 %   MCV 92.5 78.0 - 100.0 fL   MCH 30.0 26.0 - 34.0 pg   MCHC 32.5 30.0 - 36.0 g/dL   RDW 14.1 11.5 - 15.5 %   Platelets 345 150 - 400 K/uL   Neutrophils Relative % 75 43 - 77 %   Neutro Abs 7.4 1.7 - 7.7 K/uL   Lymphocytes Relative 10 (L) 12 - 46 %   Lymphs Abs 1.0 0.7 - 4.0 K/uL   Monocytes Relative 9 3 - 12 %   Monocytes Absolute 0.9 0.1 - 1.0 K/uL   Eosinophils Relative 5 0 - 5 %   Eosinophils Absolute 0.5 0.0 - 0.7 K/uL   Basophils Relative 1 0 - 1 %   Basophils Absolute 0.1 0.0 - 0.1 K/uL  Protime-INR     Status: Abnormal   Collection Time: 03/02/15  2:30 PM  Result Value Ref Range   Prothrombin Time 15.6 (H) 11.6 - 15.2 seconds   INR 1.23 0.00 - 1.49  Troponin I-serum (0, 3, 6 hours)     Status: Abnormal   Collection Time: 03/02/15  5:19 PM  Result Value Ref Range   Troponin I 0.09 (H) <0.031 ng/mL    Comment:        PERSISTENTLY INCREASED TROPONIN VALUES IN THE RANGE OF 0.04-0.49 ng/mL CAN BE SEEN IN:       -UNSTABLE ANGINA       -CONGESTIVE  HEART FAILURE       -MYOCARDITIS       -CHEST TRAUMA       -ARRYHTHMIAS       -LATE PRESENTING MYOCARDIAL INFARCTION       -COPD   CLINICAL FOLLOW-UP RECOMMENDED.   Troponin I-serum (0, 3, 6 hours)     Status: Abnormal   Collection Time: 03/02/15  8:00 PM  Result Value Ref Range   Troponin I 0.10 (H) <0.031 ng/mL    Comment:        PERSISTENTLY INCREASED TROPONIN VALUES IN THE RANGE OF 0.04-0.49 ng/mL CAN BE SEEN IN:       -UNSTABLE ANGINA       -CONGESTIVE HEART FAILURE       -MYOCARDITIS       -CHEST TRAUMA       -ARRYHTHMIAS       -LATE PRESENTING MYOCARDIAL INFARCTION       -COPD   CLINICAL FOLLOW-UP RECOMMENDED.   Glucose, capillary     Status: Abnormal   Collection Time: 03/02/15 10:13 PM  Result Value Ref Range   Glucose-Capillary 187 (H) 65 - 99 mg/dL   Comment 1 Notify RN    Comment 2 Document in Chart   Troponin I-serum (0, 3, 6 hours)     Status: Abnormal   Collection Time: 03/02/15 10:55 PM  Result Value Ref Range   Troponin I 0.11 (H) <0.031 ng/mL  Comment:        PERSISTENTLY INCREASED TROPONIN VALUES IN THE RANGE OF 0.04-0.49 ng/mL CAN BE SEEN IN:       -UNSTABLE ANGINA       -CONGESTIVE HEART FAILURE       -MYOCARDITIS       -CHEST TRAUMA       -ARRYHTHMIAS       -LATE PRESENTING MYOCARDIAL INFARCTION       -COPD   CLINICAL FOLLOW-UP RECOMMENDED.   Heparin level (unfractionated)     Status: None   Collection Time: 03/03/15  1:48 AM  Result Value Ref Range   Heparin Unfractionated 0.45 0.30 - 0.70 IU/mL    Comment:        IF HEPARIN RESULTS ARE BELOW EXPECTED VALUES, AND PATIENT DOSAGE HAS BEEN CONFIRMED, SUGGEST FOLLOW UP TESTING OF ANTITHROMBIN III LEVELS.   CBC     Status: Abnormal   Collection Time: 03/03/15  1:48 AM  Result Value Ref Range   WBC 9.4 4.0 - 10.5 K/uL   RBC 3.35 (L) 4.22 - 5.81 MIL/uL   Hemoglobin 9.8 (L) 13.0 - 17.0 g/dL   HCT 30.7 (L) 39.0 - 52.0 %   MCV 91.6 78.0 - 100.0 fL   MCH 29.3 26.0 - 34.0 pg   MCHC 31.9  30.0 - 36.0 g/dL   RDW 14.0 11.5 - 15.5 %   Platelets 346 150 - 400 K/uL  Glucose, capillary     Status: Abnormal   Collection Time: 03/03/15  7:49 AM  Result Value Ref Range   Glucose-Capillary 59 (L) 65 - 99 mg/dL   Comment 1 Notify RN   Glucose, capillary     Status: None   Collection Time: 03/03/15  8:48 AM  Result Value Ref Range   Glucose-Capillary 93 65 - 99 mg/dL   Comment 1 Notify RN   Glucose, capillary     Status: Abnormal   Collection Time: 03/03/15 12:05 PM  Result Value Ref Range   Glucose-Capillary 121 (H) 65 - 99 mg/dL   Comment 1 Notify RN   Body fluid cell count with differential     Status: Abnormal   Collection Time: 03/03/15  2:00 PM  Result Value Ref Range   Fluid Type-FCT FLUID     Comment: LEFT PLEURAL CORRECTED ON 07/12 AT 1500: PREVIOUSLY REPORTED AS Pleural, L    Color, Fluid YELLOW YELLOW   Appearance, Fluid CLEAR (A) CLEAR   WBC, Fluid 463 0 - 1000 cu mm   Neutrophil Count, Fluid 10 0 - 25 %   Lymphs, Fluid 75 %   Monocyte-Macrophage-Serous Fluid 10 (L) 50 - 90 %   Eos, Fluid 5 %   Other Cells, Fluid PATH REVIEW TO FOLLOW  %  pH, body fluid     Status: None   Collection Time: 03/03/15  2:00 PM  Result Value Ref Range   pH, Fluid Type NOT STREAKED FOR ISOLATION     Comment: CORRECTED ON 07/13 AT 1041: PREVIOUSLY REPORTED AS FLUID LEFT PLEURAL, CORRECTED ON 07/12 AT 1500: PREVIOUSLY REPORTED AS Body Fluid   pH, Fluid 8.00     Comment: Performed at [facility]  Lactate dehydrogenase (CSF, pleural or peritoneal fluid)     Status: Abnormal   Collection Time: 03/03/15  2:00 PM  Result Value Ref Range   LD, Fluid 153 (H) 3 - 23 U/L    Comment: (NOTE) Results should be evaluated in conjunction with serum values  Fluid Type-FLDH FLUID     Comment: LEFT PLEURAL CORRECTED ON 07/12 AT 1501: PREVIOUSLY REPORTED AS Pleural, L   Protein, fluid - pleural or peritoneal     Status: None   Collection Time: 03/03/15  2:00 PM  Result Value Ref  Range   Total protein, fluid 3.1 g/dL    Comment: (NOTE) No normal range established for this test Results should be evaluated in conjunction with serum values    Fluid Type-FTP FLUID     Comment: LEFT PLEURAL CORRECTED ON 07/12 AT 1501: PREVIOUSLY REPORTED AS Pleural, L   Culture, body fluid-bottle     Status: None (Preliminary result)   Collection Time: 03/03/15  2:00 PM  Result Value Ref Range   Specimen Description FLUID LEFT PLEURAL    Special Requests BOTTLES DRAWN AEROBIC AND ANAEROBIC 10CC EACH    Culture NO GROWTH < 24 HOURS    Report Status PENDING   Gram stain     Status: None   Collection Time: 03/03/15  2:00 PM  Result Value Ref Range   Specimen Description FLUID LEFT PLEURAL    Special Requests NONE    Gram Stain      CYTOSPIN SMEAR WBC PRESENT,BOTH PMN AND MONONUCLEAR NO ORGANISMS SEEN Performed at [facility]    Report Status 03/03/2015 FINAL   Lactate dehydrogenase     Status: Abnormal   Collection Time: 03/03/15  2:55 PM  Result Value Ref Range   LDH 214 (H) 98 - 192 U/L  Protein, total     Status: Abnormal   Collection Time: 03/03/15  2:55 PM  Result Value Ref Range   Total Protein 6.2 (L) 6.5 - 8.1 g/dL  Glucose, capillary     Status: Abnormal   Collection Time: 03/03/15  4:37 PM  Result Value Ref Range   Glucose-Capillary 147 (H) 65 - 99 mg/dL  Heparin level (unfractionated)     Status: Abnormal   Collection Time: 03/03/15  4:53 PM  Result Value Ref Range   Heparin Unfractionated 0.10 (L) 0.30 - 0.70 IU/mL    Comment:        IF HEPARIN RESULTS ARE BELOW EXPECTED VALUES, AND PATIENT DOSAGE HAS BEEN CONFIRMED, SUGGEST FOLLOW UP TESTING OF ANTITHROMBIN III LEVELS.   Heparin level (unfractionated)     Status: Abnormal   Collection Time: 03/03/15  8:24 PM  Result Value Ref Range   Heparin Unfractionated 0.19 (L) 0.30 - 0.70 IU/mL    Comment:        IF HEPARIN RESULTS ARE BELOW EXPECTED VALUES, AND PATIENT DOSAGE HAS BEEN  CONFIRMED, SUGGEST FOLLOW UP TESTING OF ANTITHROMBIN III LEVELS.   Glucose, capillary     Status: Abnormal   Collection Time: 03/03/15 10:33 PM  Result Value Ref Range   Glucose-Capillary 148 (H) 65 - 99 mg/dL  Basic metabolic panel     Status: Abnormal   Collection Time: 03/04/15  5:30 AM  Result Value Ref Range   Sodium 140 135 - 145 mmol/L   Potassium 4.1 3.5 - 5.1 mmol/L    Comment: DELTA CHECK NOTED   Chloride 105 101 - 111 mmol/L   CO2 28 22 - 32 mmol/L   Glucose, Bld 164 (H) 65 - 99 mg/dL   BUN 34 (H) 6 - 20 mg/dL   Creatinine, Ser 1.83 (H) 0.61 - 1.24 mg/dL   Calcium 9.1 8.9 - 10.3 mg/dL   GFR calc non Af Amer 33 (L) >60 mL/min   GFR calc Af  Amer 38 (L) >60 mL/min    Comment: (NOTE) The eGFR has been calculated using the CKD EPI equation. This calculation has not been validated in all clinical situations. eGFR's persistently <60 mL/min signify possible Chronic Kidney Disease.    Anion gap 7 5 - 15  CBC     Status: Abnormal   Collection Time: 03/04/15  5:30 AM  Result Value Ref Range   WBC 8.8 4.0 - 10.5 K/uL   RBC 3.40 (L) 4.22 - 5.81 MIL/uL   Hemoglobin 10.1 (L) 13.0 - 17.0 g/dL   HCT 31.9 (L) 39.0 - 52.0 %   MCV 93.8 78.0 - 100.0 fL   MCH 29.7 26.0 - 34.0 pg   MCHC 31.7 30.0 - 36.0 g/dL   RDW 14.3 11.5 - 15.5 %   Platelets 320 150 - 400 K/uL  Glucose, capillary     Status: Abnormal   Collection Time: 03/04/15  7:42 AM  Result Value Ref Range   Glucose-Capillary 162 (H) 65 - 99 mg/dL   Comment 1 Notify RN     Dg Chest 1 View  03/03/2015   CLINICAL DATA:  Post thoracentesis.  EXAM: CHEST  1 VIEW  COMPARISON:  CT 03/02/2015.  Chest x-ray 03/02/2015 .  FINDINGS: Prior CABG. Stable cardiomegaly. Interim near complete clearing of pulmonary interstitial edema with minimal residual. Mild component congestive heart failure cannot be excluded. Interim resolution of right pleural effusion. No pneumothorax  IMPRESSION: 1. No pneumothorax post thoracentesis. 2. Prior  CABG. Stable cardiomegaly. Interim near complete clearing of pulmonary interstitial edema .   Electronically Signed   By: Marcello Moores  Register   On: 03/03/2015 14:10   Ct Angio Chest Pe W/cm &/or Wo Cm  03/02/2015   CLINICAL DATA:  81 year old male with chest pain and shortness of breath which developed while in rehab earlier today  EXAM: CT ANGIOGRAPHY CHEST WITH CONTRAST  TECHNIQUE: Multidetector CT imaging of the chest was performed using the standard protocol during bolus administration of intravenous contrast. Multiplanar CT image reconstructions and MIPs were obtained to evaluate the vascular anatomy.  CONTRAST:  166m OMNIPAQUE IOHEXOL 350 MG/ML SOLN  COMPARISON:  Chest x-ray obtained earlier today  FINDINGS: Mediastinum: Unremarkable CT appearance of the thyroid gland. No suspicious mediastinal or hilar adenopathy. No soft tissue mediastinal mass. The thoracic esophagus is unremarkable.  Heart/Vascular: Adequate opacification of the pulmonary arteries to the proximal subsegmental level. Filling defects are present within the right lobar pulmonary artery extending into segmental branches. Additionally, there are branches within segmental pulmonary arteries in the right middle lobe and within the right lower lobar pulmonary artery extending into segmental branches. No definite pulmonary embolus identified on the left. The heart is enlarged and there is both left ventricular dilatation and concentric hypertrophy of the left ventricular myocardium. Normal RV/LV ratio. No evidence of right heart strain. Atherosclerotic calcifications throughout the coronary arteries. Patient is status post median sternotomy with evidence of CABG. No pericardial effusion.  Lungs/Pleura: Moderately large layering left pleural effusion with associated left basilar atelectasis. Trace dependent atelectasis in the right lower lobe. No evidence of pulmonary infarct.  Bones/Soft Tissues: No acute fracture or aggressive appearing lytic or  blastic osseous lesion.  Upper Abdomen: Visualized upper abdominal organs are unremarkable.  Review of the MIP images confirms the above findings.  IMPRESSION: 1. Positive for acute pulmonary emboli within the lobar and segmental pulmonary arteries in the right upper and lower lobes. No evidence of left-sided pulmonary embolus and no evidence of right  heart strain. 2. Cardiomegaly with both left ventricular dilatation and concentric hypertrophy of the left ventricular myocardium. 3. Surgical changes of multivessel coronary artery bypass procedure. 4. Moderately large layering left pleural effusion with associated left lower lobe atelectasis. These results were called by telephone at the time of interpretation on 03/02/2015 at 5:03 pm to Dr. Wyvonnia Dusky, who verbally acknowledged these results.   Electronically Signed   By: Jacqulynn Cadet M.D.   On: 03/02/2015 17:05   Dg Chest Port 1 View  03/02/2015   CLINICAL DATA:  Chest pain.  New onset lower chest pain.  EXAM: PORTABLE CHEST - 1 VIEW  COMPARISON:  02/22/2015.  FINDINGS: Cardiopericardial silhouette is enlarged. The size of the cardiopericardial silhouette is accentuated both by apical lordotic projection and some rotation on this examination. That is head, of the cardiopericardial silhouette is larger than on the prior exam 02/22/2015. There is LEFT basilar airspace disease and atelectasis. Probable LEFT pleural effusion. RIGHT lung base appears within normal limits allowing for and a penetration on this exam. Monitoring leads project over the chest. CABG/median sternotomy.  IMPRESSION: Cardiomegaly with new LEFT basilar density likely representing a combination of atelectasis, effusion and asymmetric pulmonary edema.   Electronically Signed   By: Dereck Ligas M.D.   On: 03/02/2015 14:53   US Thoracentesis Asp Pleural Space W/img Guide  03/03/2015   CLINICAL DATA:  Thoracentesis.  EXAM: ULTRASOUND GUIDED THORACENTESIS  COMPARISON:  CT 03/02/2015 .   PROCEDURE: An ultrasound guided thoracentesis was thoroughly discussed with the patient and questions answered. The benefits, risks, alternatives and complications were also discussed. The patient understands and wishes to proceed with the procedure. Written consent was obtained.  Ultrasound was performed to localize and mark an adequate pocket of fluid in the left chest. The area was then prepped and draped in the normal sterile fashion. 1% Lidocaine was used for local anesthesia. Under ultrasound guidance a 19 gauge Yueh catheter was introduced. Thoracentesis was performed. The catheter was removed and a dressing applied.  Complications:  None.  FINDINGS: A total of approximately 1000 cc of clear yellow fluid was removed. A fluid sample wassent for laboratory analysis.  IMPRESSION: Successful ultrasound guided left thoracentesis yielding a 1000 cc of pleural fluid.   Electronically Signed   By: Marcello Moores  Register   On: 03/03/2015 14:12    ROS  See HPI Eyes: Negative Ears: positive  for hearing loss,negative tinnitus Cardiovascular: Negative for chest pain, palpitations,irregular heartbeat, dyspnea,  near-syncope, orthopnea, paroxysmal nocturnal dyspnea and syncope,edema, claudication, cyanosis,.  Respiratory:   Negative for cough, hemoptysis, sleep disturbances due to breathing, sputum production and wheezing.   Endocrine: Negative for cold intolerance and heat intolerance.  Hematologic/Lymphatic: Negative for adenopathy and bleeding problem. Does not bruise/bleed easily.  Musculoskeletal: Negative.   Gastrointestinal:Left abdominal pain resolved. Negative for nausea, vomiting, reflux,  diarrhea, constipation.   Genitourinary: Negative for bladder incontinence, dysuria, flank pain, frequency, hematuria, hesitancy, nocturia and urgency.  Neurological: Negative.  Allergic/Immunologic: Negative for environmental allergies.  Blood pressure 129/58, pulse 62, temperature 98.8 F (37.1 C), temperature  source Oral, resp. rate 18, height 5' 9.5" (1.765 m), weight 197 lb 8.5 oz (89.6 kg), SpO2 100 %. Physical Exam PHYSICAL EXAM: Well-nournished, in no acute distress. Neck: No JVD, HJR, Bruit, or thyroid enlargement Lungs: Decreased breath sounds throughout Cardiovascular: RRR, PMI not displaced, 2/6 systolic murmur LSB, no gallops, bruit, thrill, or heave. Abdomen: BS normal. Soft without organomegaly, masses, lesions or tenderness. Extremities: plus 2 edema bilaterally,  without cyanosis, clubbing. Good distal pulses bilateral SKin: Warm, no lesions or rashes  Musculoskeletal: No deformities Neuro: no focal signs  2Decho 02/01/15: Study Conclusions  - Left ventricle: The cavity size was normal. Wall thickness was   increased in a pattern of moderate LVH. Systolic function was   normal. The estimated ejection fraction was in the range of 50%   to 55%. Diastolic function is abnormal, indeterminate grade. Wall   motion was normal; there were no regional wall motion   abnormalities. - Aortic valve: Mildly calcified annulus. Mildly thickened   leaflets. Valve area (VTI): 2.01 cm^2. Valve area (Vmax): 2.32   cm^2. - Mitral valve: Mildly calcified annulus. Normal thickness leaflets   . - Left atrium: The atrium was moderately dilated. - Right atrium: The atrium was mildly dilated. - Atrial septum: No defect or patent foramen ovale was identified. - Technically adequate study.  Echo TEE intraop 02/17/15 Study Conclusions  - Left ventricle: Systolic function was moderately reduced. The   estimated ejection fraction was in the range of 35% to 40%.   Hypokinesis of the inferoseptal myocardium. Akinesis of the   anteroseptal myocardium. of the inferolateral myocardium.   Hypokinesis of the inferolateral myocardium. - Aortic valve: No evidence of vegetation. There was trivial   regurgitation. - Mitral valve: There was mild regurgitation. - Left atrium: No evidence of thrombus in the atrial  cavity or   appendage. No evidence of thrombus in the appendage. - Right atrium: No evidence of thrombus in the atrial cavity or   appendage. - Atrial septum: No defect or patent foramen ovale was identified. - Tricuspid valve: No evidence of vegetation. - Pulmonic valve: No evidence of vegetation. - Pericardium, extracardiac: There was a left pleural effusion.  Impressions:  - The findings indicate significant septal-lateral left ventricular   wall dyssynchrony.  Lower extremity dopplers: IMPRESSION: No evidence of deep venous thrombosis.     Electronically Signed   By: Marcello Moores  Register   On: 03/04/2015 10:42  Assessment/Plan: Acute Pulmonary Embolism: now on Eliquis  Left pleural effusion S/P US guided thoracentesis drawing 1000 cc fluid  Elevated Troponins most likely secondary to pulmonary embolus  CABG x 5 02/17/15 after recent NSTEMI- Normal LV function EF 50-55%  on 2Decho with moderated LVH diastolic dysfunction.  CKD Stage IV Crt stable at 1.83  HTN on clonidine 0.2 mg BID and lopressor 25 mg BID, Lasix and Kdur  History of diastolic CHF-compensated and improved LV function.     Ermalinda Barrios 03/04/2015, 10:43 AM   Patient seen and discussed with PA Bonnell Public, I agree with her documentation. 81 yo male history of DM2, HTN, presumed CAD with recent CABG admitted with chest pain and elevated troponin. Trop of 0.09 and relatively flat. Recent NSTEMI 2 weeks ago with peak trop of 3. CT PE shows PE without evidence of RV strain, left effusion. EKG sinus rhythm with anterior and lateral TWIs that are old. Echo normal LVEF, grade I diastolic dysfunction, no WMAs, normal RV. I suspect his elevated trop is related to his PE, no plans for ischemic testing at this time. Continue medical therapy for CAD, should be on high dose statin, no ACE due to poor renal function. Will signoff inpatient care.   Zandra Abts MD

## 2015-03-04 NOTE — Discharge Instructions (Addendum)
Information on my medicine - ELIQUIS (apixaban)  This medication education was reviewed with me or my healthcare representative as part of my discharge preparation.  The pharmacist that spoke with me during my hospital stay was:  Ena Dawley, [facility]  Why was Eliquis prescribed for you? Eliquis was prescribed to treat blood clots that may have been found in the veins of your legs (deep vein thrombosis) or in your lungs (pulmonary embolism) and to reduce the risk of them occurring again.  What do You need to know about Eliquis ? The starting dose is 10 mg (two 5 mg tablets) taken TWICE daily for the FIRST SEVEN (7) DAYS, then on (enter date)  03/11/15  the dose is reduced to ONE 5 mg tablet taken TWICE daily.  Eliquis may be taken with or without food.   Try to take the dose about the same time in the morning and in the evening. If you have difficulty swallowing the tablet whole please discuss with your pharmacist how to take the medication safely.  Take Eliquis exactly as prescribed and DO NOT stop taking Eliquis without talking to the doctor who prescribed the medication.  Stopping may increase your risk of developing a new blood clot.  Refill your prescription before you run out.  After discharge, you should have regular check-up appointments with your healthcare provider that is prescribing your Eliquis.    What do you do if you miss a dose? If a dose of ELIQUIS is not taken at the scheduled time, take it as soon as possible on the same day and twice-daily administration should be resumed. The dose should not be doubled to make up for a missed dose.  Important Safety Information A possible side effect of Eliquis is bleeding. You should call your healthcare provider right away if you experience any of the following: ? Bleeding from an injury or your nose that does not stop. ? Unusual colored urine (red or dark brown) or unusual colored stools (red or black). ? Unusual bruising for  unknown reasons. ? A serious fall or if you hit your head (even if there is no bleeding).  Some medicines may interact with Eliquis and might increase your risk of bleeding or clotting while on Eliquis. To help avoid this, consult your healthcare provider or pharmacist prior to using any new prescription or non-prescription medications, including herbals, vitamins, non-steroidal anti-inflammatory drugs (NSAIDs) and supplements.  This website has more information on Eliquis (apixaban): http://www.eliquis.com/eliquis/home Apixaban oral tablets What is this medicine? APIXABAN (a PIX a ban) is an anticoagulant (blood thinner). It is used to lower the chance of stroke in people with a medical condition called atrial fibrillation. It is also used to treat or prevent blood clots in the lungs or in the veins. This medicine may be used for other purposes; ask your health care provider or pharmacist if you have questions. COMMON BRAND NAME(S): Eliquis What should I tell my health care provider before I take this medicine? They need to know if you have any of these conditions: -bleeding disorders -bleeding in the brain -blood in your stools (black or tarry stools) or if you have blood in your vomit -history of stomach bleeding -kidney disease -liver disease -mechanical heart valve -an unusual or allergic reaction to apixaban, other medicines, foods, dyes, or preservatives -pregnant or trying to get pregnant -breast-feeding How should I use this medicine? Take this medicine by mouth with a glass of water. Follow the directions on the prescription label.  You can take it with or without food. If it upsets your stomach, take it with food. Take your medicine at regular intervals. Do not take it more often than directed. Do not stop taking except on your doctor's advice. Stopping this medicine may increase your risk of a blot clot. Be sure to refill your prescription before you run out of medicine. Talk  to your pediatrician regarding the use of this medicine in children. Special care may be needed. Overdosage: If you think you have taken too much of this medicine contact a poison control center or emergency room at once. NOTE: This medicine is only for you. Do not share this medicine with others. What if I miss a dose? If you miss a dose, take it as soon as you can. If it is almost time for your next dose, take only that dose. Do not take double or extra doses. What may interact with this medicine? This medicine may interact with the following: -aspirin and aspirin-like medicines -certain medicines for fungal infections like ketoconazole and itraconazole -certain medicines for seizures like carbamazepine and phenytoin -certain medicines that treat or prevent blood clots like warfarin, enoxaparin, and dalteparin -clarithromycin -NSAIDs, medicines for pain and inflammation, like ibuprofen or naproxen -rifampin -ritonavir -St. John's wort This list may not describe all possible interactions. Give your health care provider a list of all the medicines, herbs, non-prescription drugs, or dietary supplements you use. Also tell them if you smoke, drink alcohol, or use illegal drugs. Some items may interact with your medicine. What should I watch for while using this medicine? Notify your doctor or health care professional and seek emergency treatment if you develop breathing problems; changes in vision; chest pain; severe, sudden headache; pain, swelling, warmth in the leg; trouble speaking; sudden numbness or weakness of the face, arm, or leg. These can be signs that your condition has gotten worse. If you are going to have surgery, tell your doctor or health care professional that you are taking this medicine. Tell your health care professional that you use this medicine before you have a spinal or epidural procedure. Sometimes people who take this medicine have bleeding problems around the spine when  they have a spinal or epidural procedure. This bleeding is very rare. If you have a spinal or epidural procedure while on this medicine, call your health care professional immediately if you have back pain, numbness or tingling (especially in your legs and feet), muscle weakness, paralysis, or loss of bladder or bowel control. Avoid sports and activities that might cause injury while you are using this medicine. Severe falls or injuries can cause unseen bleeding. Be careful when using sharp tools or knives. Consider using an Copy. Take special care brushing or flossing your teeth. Report any injuries, bruising, or red spots on the skin to your doctor or health care professional. What side effects may I notice from receiving this medicine? Side effects that you should report to your doctor or health care professional as soon as possible: -allergic reactions like skin rash, itching or hives, swelling of the face, lips, or tongue -signs and symptoms of bleeding such as bloody or black, tarry stools; red or dark-brown urine; spitting up blood or brown material that looks like coffee grounds; red spots on the skin; unusual bruising or bleeding from the eye, gums, or nose This list may not describe all possible side effects. Call your doctor for medical advice about side effects. You may report side effects to FDA  at 1-800-FDA-1088. Where should I keep my medicine? Keep out of the reach of children. Store at room temperature between 20 and 25 degrees C (68 and 77 degrees F). Throw away any unused medicine after the expiration date. NOTE: This sheet is a summary. It may not cover all possible information. If you have questions about this medicine, talk to your doctor, pharmacist, or health care provider.  2015, Elsevier/Gold Standard. (2013-04-12 11:59:24)

## 2015-03-04 NOTE — Progress Notes (Signed)
PT Cancellation Note  Patient Details Name: Zachary Mcmahon MRN: [id number] DOB: 11/08/1931   Cancelled Treatment:    Reason Eval/Treat Not Completed: Patient not medically ready. Chart reviewed, c note findings of acute PE on 7/11 @ 1705. Holding pt treatment/evaluation at this time due to acute PE, waiting until 48 hours after commencement of anticoagulation therapy started on 7/11 @ 1852. Will attempt at later date/time once appropriate.     [name] C 03/04/2015, 10:00 AM  10:03 AM  Etta Grandchild, PT, DPT [place] License # [id number]

## 2015-03-04 NOTE — Progress Notes (Signed)
[facility] PROGRESS NOTE  Zachary Mcmahon [id number] DOB: Jun 20, 1932 DOA: 03/02/2015 PCP: [name], MD  Assessment/Plan: 1. Pulmonary embolism- patient came with chest pain, CT injury gram of the chest revealed the embolism in right upper and lower lobes, likely provoked from recent surgery. Patient initially was started on heparin, which has not been switched to Apixaban. Will check bilateral venous lower extremity duplex to rule out DVT. 2. Chest pain with recent elevated troponin- cardiology consulted, no further ischemic workup at this time patient is status post CABG 2 weeks ago. 3. Left pleural effusion- patient underwent thoracentesis removing 1000 mL of pleural fluid as per CT surgery recommendation 4. Coronary artery disease- status post CABG, continue with aspirin, atorvastatin, metoprolol 5. Diabetes mellitus- continue Levemir, sliding-scale insulin with NovoLog 6. Hypertension- blood pressure controlled, continue clonidine and metoprolol  Code Status: Full code Family Communication: No family at bedside Disposition Plan: SNF   Consultants:  Cardiology   Procedures: None   Antibiotics:  *None  HPI/Subjective: 81 yr old male with h/o Diabetes mellitus, hypertension, recent CABG two weeks ago admitted with chest pain and found to have pulmonary embolism. Also underwent thoracentesis and removed 1000 cc pleural fluid.  This morning, patient feels better. Denies chest pain or shortness of breath.  Objective: Filed Vitals:   03/04/15 1127  BP: 136/65  Pulse: 78  Temp:   Resp:     Intake/Output Summary (Last 24 hours) at 03/04/15 1628 Last data filed at 03/04/15 1537  Gross per 24 hour  Intake 1167.16 ml  Output    800 ml  Net 367.16 ml   Filed Weights   03/02/15 1412 03/02/15 1855  Weight: 95.709 kg (211 lb) 89.6 kg (197 lb 8.5 oz)    Exam:   General:  *Appear in no acute distress  Cardiovascular: S1S2 RRR  Respiratory: Clear  bilaterally  Abdomen: Soft, nontender  Musculoskeletal: No edema  Data Reviewed: Basic Metabolic Panel:  Recent Labs Lab 03/02/15 0630 03/02/15 1430 03/04/15 0530  NA 142 140 140  K 4.4 5.2* 4.1  CL 105 103 105  CO2 29 30 28   GLUCOSE 52* 157* 164*  BUN 36* 38* 34*  CREATININE 1.53* 1.53* 1.83*  CALCIUM 9.9 9.6 9.1   Liver Function Tests:  Recent Labs Lab 03/03/15 1455  PROT 6.2*   No results for input(s): LIPASE, AMYLASE in the last 168 hours. No results for input(s): AMMONIA in the last 168 hours. CBC:  Recent Labs Lab 03/02/15 0630 03/02/15 1430 03/03/15 0148 03/04/15 0530  WBC 9.0 9.9 9.4 8.8  NEUTROABS  --  7.4  --   --   HGB 9.7* 10.0* 9.8* 10.1*  HCT 31.1* 30.8* 30.7* 31.9*  MCV 93.1 92.5 91.6 93.8  PLT 359 345 346 320   Cardiac Enzymes:  Recent Labs Lab 03/02/15 1430 03/02/15 1719 03/02/15 2000 03/02/15 2255  TROPONINI 0.09* 0.09* 0.10* 0.11*   BNP (last 3 results)  Recent Labs  02/06/15 1612  BNP 279.0*    ProBNP (last 3 results) No results for input(s): PROBNP in the last 8760 hours.  CBG:  Recent Labs Lab 03/03/15 1205 03/03/15 1637 03/03/15 2233 03/04/15 0742 03/04/15 1125  GLUCAP 121* 147* 148* 162* 147*    Recent Results (from the past 240 hour(s))  Culture, body fluid-bottle     Status: None (Preliminary result)   Collection Time: 03/03/15  2:00 PM  Result Value Ref Range Status   Specimen Description FLUID LEFT PLEURAL  Final   Special  Requests BOTTLES DRAWN AEROBIC AND ANAEROBIC 10CC EACH  Final   Culture NO GROWTH < 24 HOURS  Final   Report Status PENDING  Incomplete  Gram stain     Status: None   Collection Time: 03/03/15  2:00 PM  Result Value Ref Range Status   Specimen Description FLUID LEFT PLEURAL  Final   Special Requests NONE  Final   Gram Stain   Final    CYTOSPIN SMEAR WBC PRESENT,BOTH PMN AND MONONUCLEAR NO ORGANISMS SEEN Performed at [facility]    Report Status 03/03/2015 FINAL   Final     Studies: Dg Chest 1 View  03/03/2015   CLINICAL DATA:  Post thoracentesis.  EXAM: CHEST  1 VIEW  COMPARISON:  CT 03/02/2015.  Chest x-ray 03/02/2015 .  FINDINGS: Prior CABG. Stable cardiomegaly. Interim near complete clearing of pulmonary interstitial edema with minimal residual. Mild component congestive heart failure cannot be excluded. Interim resolution of right pleural effusion. No pneumothorax  IMPRESSION: 1. No pneumothorax post thoracentesis. 2. Prior CABG. Stable cardiomegaly. Interim near complete clearing of pulmonary interstitial edema .   Electronically Signed   By: Marcello Moores  Register   On: 03/03/2015 14:10   Ct Angio Chest Pe W/cm &/or Wo Cm  03/02/2015   CLINICAL DATA:  81 year old male with chest pain and shortness of breath which developed while in rehab earlier today  EXAM: CT ANGIOGRAPHY CHEST WITH CONTRAST  TECHNIQUE: Multidetector CT imaging of the chest was performed using the standard protocol during bolus administration of intravenous contrast. Multiplanar CT image reconstructions and MIPs were obtained to evaluate the vascular anatomy.  CONTRAST:  129mL OMNIPAQUE IOHEXOL 350 MG/ML SOLN  COMPARISON:  Chest x-ray obtained earlier today  FINDINGS: Mediastinum: Unremarkable CT appearance of the thyroid gland. No suspicious mediastinal or hilar adenopathy. No soft tissue mediastinal mass. The thoracic esophagus is unremarkable.  Heart/Vascular: Adequate opacification of the pulmonary arteries to the proximal subsegmental level. Filling defects are present within the right lobar pulmonary artery extending into segmental branches. Additionally, there are branches within segmental pulmonary arteries in the right middle lobe and within the right lower lobar pulmonary artery extending into segmental branches. No definite pulmonary embolus identified on the left. The heart is enlarged and there is both left ventricular dilatation and concentric hypertrophy of the left ventricular  myocardium. Normal RV/LV ratio. No evidence of right heart strain. Atherosclerotic calcifications throughout the coronary arteries. Patient is status post median sternotomy with evidence of CABG. No pericardial effusion.  Lungs/Pleura: Moderately large layering left pleural effusion with associated left basilar atelectasis. Trace dependent atelectasis in the right lower lobe. No evidence of pulmonary infarct.  Bones/Soft Tissues: No acute fracture or aggressive appearing lytic or blastic osseous lesion.  Upper Abdomen: Visualized upper abdominal organs are unremarkable.  Review of the MIP images confirms the above findings.  IMPRESSION: 1. Positive for acute pulmonary emboli within the lobar and segmental pulmonary arteries in the right upper and lower lobes. No evidence of left-sided pulmonary embolus and no evidence of right heart strain. 2. Cardiomegaly with both left ventricular dilatation and concentric hypertrophy of the left ventricular myocardium. 3. Surgical changes of multivessel coronary artery bypass procedure. 4. Moderately large layering left pleural effusion with associated left lower lobe atelectasis. These results were called by telephone at the time of interpretation on 03/02/2015 at 5:03 pm to Dr. Wyvonnia Dusky, who verbally acknowledged these results.   Electronically Signed   By: Jacqulynn Cadet M.D.   On: 03/02/2015 17:05  US Venous Img Lower Bilateral  03/04/2015   CLINICAL DATA:  Bilateral lower extremity swelling.  EXAM: BILATERAL LOWER EXTREMITY VENOUS DOPPLER ULTRASOUND  TECHNIQUE: Gray-scale sonography with graded compression, as well as color Doppler and duplex ultrasound were performed to evaluate the lower extremity deep venous systems from the level of the common femoral vein and including the common femoral, femoral, profunda femoral, popliteal and calf veins including the posterior tibial, peroneal and gastrocnemius veins when visible. The superficial great saphenous vein was also  interrogated. Spectral Doppler was utilized to evaluate flow at rest and with distal augmentation maneuvers in the common femoral, femoral and popliteal veins.  COMPARISON:  None.  FINDINGS: RIGHT LOWER EXTREMITY  Common Femoral Vein: No evidence of thrombus. Normal compressibility, respiratory phasicity and response to augmentation.  Saphenofemoral Junction: No evidence of thrombus. Normal compressibility and flow on color Doppler imaging.  Profunda Femoral Vein: No evidence of thrombus. Normal compressibility and flow on color Doppler imaging.  Femoral Vein: No evidence of thrombus. Normal compressibility, respiratory phasicity and response to augmentation.  Popliteal Vein: No evidence of thrombus. Normal compressibility, respiratory phasicity and response to augmentation.  Calf Veins: No evidence of thrombus. Normal compressibility and flow on color Doppler imaging.  Superficial Great Saphenous Vein: No evidence of thrombus. Normal compressibility and flow on color Doppler imaging.  Venous Reflux:  None.  Other Findings:  None.  LEFT LOWER EXTREMITY  Common Femoral Vein: No evidence of thrombus. Normal compressibility, respiratory phasicity and response to augmentation.  Saphenofemoral Junction: No evidence of thrombus. Normal compressibility and flow on color Doppler imaging.  Profunda Femoral Vein: No evidence of thrombus. Normal compressibility and flow on color Doppler imaging.  Femoral Vein: No evidence of thrombus. Normal compressibility, respiratory phasicity and response to augmentation.  Popliteal Vein: No evidence of thrombus. Normal compressibility, respiratory phasicity and response to augmentation.  Calf Veins: No evidence of thrombus. Normal compressibility and flow on color Doppler imaging.  Superficial Great Saphenous Vein: No evidence of thrombus. Normal compressibility and flow on color Doppler imaging.  Venous Reflux:  None.  Other Findings:  None.  IMPRESSION: No evidence of deep venous  thrombosis.   Electronically Signed   By: Marcello Moores  Register   On: 03/04/2015 10:42   US Thoracentesis Asp Pleural Space W/img Guide  03/03/2015   CLINICAL DATA:  Thoracentesis.  EXAM: ULTRASOUND GUIDED THORACENTESIS  COMPARISON:  CT 03/02/2015 .  PROCEDURE: An ultrasound guided thoracentesis was thoroughly discussed with the patient and questions answered. The benefits, risks, alternatives and complications were also discussed. The patient understands and wishes to proceed with the procedure. Written consent was obtained.  Ultrasound was performed to localize and mark an adequate pocket of fluid in the left chest. The area was then prepped and draped in the normal sterile fashion. 1% Lidocaine was used for local anesthesia. Under ultrasound guidance a 19 gauge Yueh catheter was introduced. Thoracentesis was performed. The catheter was removed and a dressing applied.  Complications:  None.  FINDINGS: A total of approximately 1000 cc of clear yellow fluid was removed. A fluid sample wassent for laboratory analysis.  IMPRESSION: Successful ultrasound guided left thoracentesis yielding a 1000 cc of pleural fluid.   Electronically Signed   By: [place]   On: 03/03/2015 14:12    Scheduled Meds: . apixaban  10 mg Oral BID   Followed by  . [START ON 03/11/2015] apixaban  5 mg Oral BID  . aspirin EC  81 mg Oral q morning -  10a  . atorvastatin  80 mg Oral q1800  . cloNIDine  0.2 mg Oral BID  . ferrous sulfate  325 mg Oral Q breakfast  . furosemide  40 mg Oral Daily  . gabapentin  100 mg Oral TID  . insulin aspart  0-15 Units Subcutaneous TID WC  . insulin detemir  30 Units Subcutaneous QHS  . metoprolol tartrate  25 mg Oral BID  . pantoprazole  40 mg Oral Daily  . potassium chloride SA  10 mEq Oral Daily   Continuous Infusions:   Active Problems:   DM type 2 (diabetes mellitus, type 2)   CKD (chronic kidney disease) stage 3, GFR 30-59 ml/min   Chest pain   S/P CABG x 5   Pulmonary embolism    Acute pulmonary embolism    Time spent: 25 min    [facility] Pager [phone]. If 7PM-7AM, please contact night-coverage at [email or web address], password [facility] 03/04/2015, 4:28 PM  LOS: 0 days

## 2015-03-05 ENCOUNTER — Inpatient Hospital Stay
Admission: RE | Admit: 2015-03-05 | Discharge: 2015-03-11 | Disposition: A | Discharge status: Home / Self Care | Payer: Medicare Other | Source: Ambulatory Visit | Attending: Internal Medicine | Admitting: Internal Medicine

## 2015-03-05 ENCOUNTER — Other Ambulatory Visit: Payer: Self-pay

## 2015-03-05 DIAGNOSIS — R269 Unspecified abnormalities of gait and mobility: Secondary | ICD-10-CM | POA: Diagnosis not present

## 2015-03-05 DIAGNOSIS — I2699 Other pulmonary embolism without acute cor pulmonale: Principal | ICD-10-CM

## 2015-03-05 DIAGNOSIS — R278 Other lack of coordination: Secondary | ICD-10-CM | POA: Diagnosis not present

## 2015-03-05 DIAGNOSIS — Z951 Presence of aortocoronary bypass graft: Secondary | ICD-10-CM | POA: Diagnosis not present

## 2015-03-05 DIAGNOSIS — E1121 Type 2 diabetes mellitus with diabetic nephropathy: Secondary | ICD-10-CM | POA: Diagnosis not present

## 2015-03-05 DIAGNOSIS — I5043 Acute on chronic combined systolic (congestive) and diastolic (congestive) heart failure: Secondary | ICD-10-CM | POA: Diagnosis not present

## 2015-03-05 DIAGNOSIS — E785 Hyperlipidemia, unspecified: Secondary | ICD-10-CM | POA: Diagnosis not present

## 2015-03-05 DIAGNOSIS — N184 Chronic kidney disease, stage 4 (severe): Secondary | ICD-10-CM | POA: Diagnosis present

## 2015-03-05 DIAGNOSIS — I214 Non-ST elevation (NSTEMI) myocardial infarction: Secondary | ICD-10-CM | POA: Diagnosis not present

## 2015-03-05 DIAGNOSIS — I10 Essential (primary) hypertension: Secondary | ICD-10-CM | POA: Diagnosis not present

## 2015-03-05 DIAGNOSIS — N179 Acute kidney failure, unspecified: Secondary | ICD-10-CM | POA: Diagnosis not present

## 2015-03-05 DIAGNOSIS — Z7901 Long term (current) use of anticoagulants: Secondary | ICD-10-CM | POA: Diagnosis not present

## 2015-03-05 DIAGNOSIS — R600 Localized edema: Secondary | ICD-10-CM | POA: Diagnosis not present

## 2015-03-05 DIAGNOSIS — E119 Type 2 diabetes mellitus without complications: Secondary | ICD-10-CM | POA: Diagnosis not present

## 2015-03-05 DIAGNOSIS — Z48812 Encounter for surgical aftercare following surgery on the circulatory system: Secondary | ICD-10-CM | POA: Diagnosis not present

## 2015-03-05 DIAGNOSIS — E11311 Type 2 diabetes mellitus with unspecified diabetic retinopathy with macular edema: Secondary | ICD-10-CM | POA: Diagnosis not present

## 2015-03-05 DIAGNOSIS — M6281 Muscle weakness (generalized): Secondary | ICD-10-CM | POA: Diagnosis not present

## 2015-03-05 DIAGNOSIS — I8393 Asymptomatic varicose veins of bilateral lower extremities: Secondary | ICD-10-CM | POA: Diagnosis not present

## 2015-03-05 DIAGNOSIS — R262 Difficulty in walking, not elsewhere classified: Secondary | ICD-10-CM | POA: Diagnosis not present

## 2015-03-05 DIAGNOSIS — N183 Chronic kidney disease, stage 3 (moderate): Secondary | ICD-10-CM | POA: Diagnosis not present

## 2015-03-05 DIAGNOSIS — Z9981 Dependence on supplemental oxygen: Secondary | ICD-10-CM | POA: Diagnosis not present

## 2015-03-05 LAB — GLUCOSE, CAPILLARY
GLUCOSE-CAPILLARY: 106 mg/dL — AB (ref 65–99)
Glucose-Capillary: 69 mg/dL (ref 65–99)
Glucose-Capillary: 119 mg/dL — ABNORMAL HIGH (ref 65–99)

## 2015-03-05 MED ORDER — INSULIN DETEMIR 100 UNIT/ML ~~LOC~~ SOLN
27.0000 [IU] | Freq: Every day | SUBCUTANEOUS | Status: DC
  Filled 2015-03-05: qty 0.27

## 2015-03-05 MED ORDER — APIXABAN 5 MG PO TABS: 10.0000 mg | ORAL_TABLET | Freq: Two times a day (BID) | ORAL | Status: DC

## 2015-03-05 MED ORDER — LEVEMIR FLEXTOUCH 100 UNIT/ML ~~LOC~~ SOPN: 27.0000 [IU] | PEN_INJECTOR | Freq: Every day | SUBCUTANEOUS | Status: DC

## 2015-03-05 MED ORDER — LORAZEPAM 1 MG PO TABS: 1.0000 mg | ORAL_TABLET | Freq: Every evening | ORAL | Status: DC | PRN

## 2015-03-05 MED ORDER — APIXABAN 5 MG PO TABS: 5.0000 mg | ORAL_TABLET | Freq: Two times a day (BID) | ORAL | Status: DC

## 2015-03-05 MED ORDER — TRAMADOL HCL 50 MG PO TABS: 50.0000 mg | ORAL_TABLET | Freq: Four times a day (QID) | ORAL | Status: DC | PRN

## 2015-03-05 NOTE — Care Management Important Message (Signed)
Important Message  Patient Details  Name: Zachary Mcmahon MRN: [id number] Date of Birth: [birth year]   Medicare Important Message Given:  Yes-second notification given    Joylene Draft, RN 03/05/2015, 11:05 AM

## 2015-03-05 NOTE — Care Management Note (Signed)
Case Management Note  Patient Details  Name: Aesop Millhouse MRN: [id number] Date of Birth: [birth year]  Subjective/Objective:                    Action/Plan:   Expected Discharge Date:                  Expected Discharge Plan:  Skilled Nursing Facility  In-House Referral:  Clinical Social Work  Discharge planning Services  CM Consult  Post Acute Care Choice:  NA Choice offered to:  NA  DME Arranged:    DME Agency:     HH Arranged:    [place] Agency:     Status of Service:  Completed, signed off  Medicare Important Message Given:    Date Medicare IM Given:    Medicare IM give by:    Date Additional Medicare IM Given:    Additional Medicare Important Message give by:     If discussed at [place] of Stay Meetings, dates discussed:    Additional Comments: Pt discharged back to [facility] today. CSW to arrange discharge to facility. Christinia Gully [place], RN 03/05/2015, 11:04 AM

## 2015-03-05 NOTE — Telephone Encounter (Signed)
Rx faxed to [facility] @ [phone], phone number [phone]  

## 2015-03-05 NOTE — Progress Notes (Signed)
Inpatient Diabetes Program Recommendations  AACE/ADA: New Consensus Statement on Inpatient Glycemic Control (2013)  Target Ranges:  Prepandial:   less than 140 mg/dL      Peak postprandial:   less than 180 mg/dL (1-2 hours)      Critically ill patients:  140 - 180 mg/dL   Results for Zachary Mcmahon, Zachary Mcmahon (MRN [id number]) as of 03/05/2015 08:16  Ref. Range 03/04/2015 07:42 03/04/2015 11:25 03/04/2015 16:52 03/04/2015 20:42 03/05/2015 07:39  Glucose-Capillary Latest Ref Range: 65-99 mg/dL 162 (H) 147 (H) 138 (H) 177 (H) 69   Current orders for Inpatient glycemic control: Levemir 30 units HS, Novolog 0-15 units TID with meals  Inpatient Diabetes Program Recommendations Insulin - Basal: Fasting glucose 69 mg/dl this morning. May want to consider decreasing Levemir to 27 units QHS (based on 89 kg x 0.3 units).  Thanks, Barnie Alderman, RN, MSN, CCRN, CDE Diabetes Coordinator Inpatient Diabetes Program [phone] (Team Pager from [place] to [place]) [phone] ([facility] office) [phone] [facility] office) [phone] [facility] office)

## 2015-03-05 NOTE — Clinical Social Work Note (Signed)
Pt d/c today back to [facility]. Pt and facility aware and agreeable. Will transfer with staff. Pt states he is notifying his POA himself and does not need CSW to.  Benay Pike, [place]

## 2015-03-05 NOTE — Discharge Summary (Addendum)
Physician Discharge Summary  Zachary Mcmahon [id number] DOB: 15-May-1932 DOA: 03/02/2015  PCP: [name], MD  Admit date: 03/02/2015 Discharge date: 03/05/2015  Time spent: 40 minutes  Recommendations for Outpatient Follow-up:  1. Follow up with cardiology as scheduled  2. Discharge to [facility]. Monitor  CBG as levemir dose adjusted   Discharge Diagnoses:  Principal Problem:   Acute pulmonary embolism Active Problems:   DM type 2 (diabetes mellitus, type 2)   Chest pain   S/P CABG x 5   Pulmonary embolism   Chronic kidney disease (CKD), stage IV (severe)   Discharge Condition: stable  Diet recommendation: carb modified heart healthy  Filed Weights   03/02/15 1412 03/02/15 1855  Weight: 95.709 kg (211 lb) 89.6 kg (197 lb 8.5 oz)    History of present illness:  This is an 81 year old man who had CABG a proximally 2 weeks prior to presentation on 03/02/15 and had been in rehabilitation for this amount of time postoperatively and had been doing well. On 03/02/15 he had one episode lasting 45 minutes of sharp left-sided pain which initially was thought to be in the chest but when on admission, he describe  pain in the left upper abdomen. He denied any nausea, vomiting, dyspnea, fever, sweating with the pain. His pain was completely resolved on admission. He denied any cough. He described the pain as a level of 3/10.   Hospital Course:  1. Pulmonary embolism- patient came with chest pain, CT angio of the chest revealed the embolism in right upper and lower lobes, likely provoked from recent surgery. Patient initially was started on heparin, which was switched to Apixaban. Bilateral venous lower extremity duplex negative for DVT. 2. Chest pain with recent elevated troponin- resolved on admission. Cardiology consulted, no further ischemic workup at this time patient is status post CABG 2 weeks ago. Will follow up with cardiology as scheduled 3. Left pleural effusion- patient underwent  thoracentesis removing 1000 mL of pleural fluid as per CT surgery recommendation. Follow up as scheduled 4. Coronary artery disease- status post CABG, continue with aspirin, atorvastatin, metoprolol 5. Diabetes mellitus-  CBG 69-170. Home Levemir dose decreased on discharge.  6. Hypertension- blood pressure controlled. 7. CKD stage IV - At baseline   Procedures:  none  Consultations:  cardiology  Discharge Exam: Filed Vitals:   03/05/15 0552  BP: 128/70  Pulse: 88  Temp: 98.2 F (36.8 C)  Resp: 18    General: well nourished appears comfortable chatty Cardiovascular: RRR +mumur 1-2+ LE edema  Respiratory: normal effort BS clear bilaterally no crackles  Discharge Instructions   Discharge Instructions    Diet - low sodium heart healthy    Complete by:  As directed      Increase activity slowly    Complete by:  As directed           Current Discharge Medication List    START taking these medications   Details  !! apixaban (ELIQUIS) 5 MG TABS tablet Take 2 tablets (10 mg total) by mouth 2 (two) times daily. For 7 days    !! apixaban (ELIQUIS) 5 MG TABS tablet Take 1 tablet (5 mg total) by mouth 2 (two) times daily. Starting 03/11/15     !! - Potential duplicate medications found. Please discuss with provider.    CONTINUE these medications which have CHANGED   Details  LEVEMIR FLEXTOUCH 100 UNIT/ML Pen Inject 27 Units into the skin at bedtime.    LORazepam (ATIVAN) 1 MG  tablet Take 1 tablet (1 mg total) by mouth at bedtime as needed for sleep. Qty: 30 tablet, Refills: 0      CONTINUE these medications which have NOT CHANGED   Details  aspirin EC 81 MG tablet Take 81 mg by mouth every morning.    atorvastatin (LIPITOR) 20 MG tablet TAKE 1 TABLET BY MOUTH DAILY AT 6:00 P.M. ( MUST SCHEDULE AN APPOINTMENT FOR FUTURE REFILLS ) Qty: 30 tablet, Refills: 0    cloNIDine (CATAPRES) 0.2 MG tablet Take 0.2 mg by mouth 2 (two) times daily.    ferrous sulfate 325 (65  FE) MG tablet Take 1 tablet (325 mg total) by mouth daily with breakfast. Refills: 3    furosemide (LASIX) 40 MG tablet Take 1 tablet (40 mg total) by mouth daily. Qty: 30 tablet    gabapentin (NEURONTIN) 100 MG capsule Take 100 mg by mouth 3 (three) times daily.     metoprolol tartrate (LOPRESSOR) 25 MG tablet Take 1 tablet (25 mg total) by mouth 2 (two) times daily.    omeprazole (PRILOSEC) 20 MG capsule Take 20 mg by mouth daily.     potassium chloride SA (K-DUR,KLOR-CON) 10 MEQ tablet Take 1 tablet (10 mEq total) by mouth daily.    traMADol (ULTRAM) 50 MG tablet Take 1 tablet (50 mg total) by mouth every 6 (six) hours as needed for moderate pain. Qty: 30 tablet, Refills: 0       Allergies  Allergen Reactions  . Bactrim [Sulfamethoxazole-Trimethoprim] Rash      The results of significant diagnostics from this hospitalization (including imaging, microbiology, ancillary and laboratory) are listed below for reference.    Significant Diagnostic Studies: Dg Chest 1 View  03/03/2015   CLINICAL DATA:  Post thoracentesis.  EXAM: CHEST  1 VIEW  COMPARISON:  CT 03/02/2015.  Chest x-ray 03/02/2015 .  FINDINGS: Prior CABG. Stable cardiomegaly. Interim near complete clearing of pulmonary interstitial edema with minimal residual. Mild component congestive heart failure cannot be excluded. Interim resolution of right pleural effusion. No pneumothorax  IMPRESSION: 1. No pneumothorax post thoracentesis. 2. Prior CABG. Stable cardiomegaly. Interim near complete clearing of pulmonary interstitial edema .   Electronically Signed   By: Marcello Moores  Register   On: 03/03/2015 14:10   Dg Chest 2 View  02/22/2015   CLINICAL DATA:  History of CABG.  Shortness of breath.  EXAM: CHEST  2 VIEW  COMPARISON:  02/21/2015  FINDINGS: Heart size is stable with post CABG changes. Negative for pneumothorax. Persistent densities at both lung bases compatible with small pleural effusions and basilar atelectasis. Minimal  change from the prior examination. Multilevel degenerative changes in thoracic spine. Epicardial pacer wires noted on the lateral view.  IMPRESSION: Stable chest radiograph findings with small pleural effusions and bibasilar atelectasis.   Electronically Signed   By: Markus Daft M.D.   On: 02/22/2015 08:56   Dg Chest 2 View  02/21/2015   CLINICAL DATA:  Post CABG  EXAM: CHEST  2 VIEW  COMPARISON:  02/20/2015  FINDINGS: Changes of CABG. Interval removal of right central line. Heart is upper limits normal in size. Small bilateral effusions, left greater than right. Minimal bibasilar atelectasis, improving since prior study. No pneumothorax.  IMPRESSION: Improving aeration with decreasing bibasilar atelectasis. Small effusions. No pneumothorax.   Electronically Signed   By: Rolm Baptise M.D.   On: 02/21/2015 07:10   Dg Chest 2 View  02/14/2015   CLINICAL DATA:  Congestive heart failure  EXAM: CHEST  2 VIEW  COMPARISON:  02/13/2015; 02/12/2015; 02/11/2015  FINDINGS: Grossly unchanged enlarged cardiac silhouette and mediastinal contours given persistently reduced lung volumes. The pulmonary vasculature remains distinct with cephalization of flow. Minimally improved aeration of the lung bases with residual bibasilar opacities, left greater than right. No new focal airspace opacities. Trace bilateral effusions are suspected. Stable position of support apparatus. No pneumothorax. Unchanged bones.  IMPRESSION: Similar findings of hypoventilation and pulmonary edema with no change to minimally improved bibasilar atelectasis, left greater than right.   Electronically Signed   By: Sandi Mariscal M.D.   On: 02/14/2015 08:07   Ct Angio Chest Pe W/cm &/or Wo Cm  03/02/2015   CLINICAL DATA:  81 year old male with chest pain and shortness of breath which developed while in rehab earlier today  EXAM: CT ANGIOGRAPHY CHEST WITH CONTRAST  TECHNIQUE: Multidetector CT imaging of the chest was performed using the standard protocol  during bolus administration of intravenous contrast. Multiplanar CT image reconstructions and MIPs were obtained to evaluate the vascular anatomy.  CONTRAST:  13mL OMNIPAQUE IOHEXOL 350 MG/ML SOLN  COMPARISON:  Chest x-ray obtained earlier today  FINDINGS: Mediastinum: Unremarkable CT appearance of the thyroid gland. No suspicious mediastinal or hilar adenopathy. No soft tissue mediastinal mass. The thoracic esophagus is unremarkable.  Heart/Vascular: Adequate opacification of the pulmonary arteries to the proximal subsegmental level. Filling defects are present within the right lobar pulmonary artery extending into segmental branches. Additionally, there are branches within segmental pulmonary arteries in the right middle lobe and within the right lower lobar pulmonary artery extending into segmental branches. No definite pulmonary embolus identified on the left. The heart is enlarged and there is both left ventricular dilatation and concentric hypertrophy of the left ventricular myocardium. Normal RV/LV ratio. No evidence of right heart strain. Atherosclerotic calcifications throughout the coronary arteries. Patient is status post median sternotomy with evidence of CABG. No pericardial effusion.  Lungs/Pleura: Moderately large layering left pleural effusion with associated left basilar atelectasis. Trace dependent atelectasis in the right lower lobe. No evidence of pulmonary infarct.  Bones/Soft Tissues: No acute fracture or aggressive appearing lytic or blastic osseous lesion.  Upper Abdomen: Visualized upper abdominal organs are unremarkable.  Review of the MIP images confirms the above findings.  IMPRESSION: 1. Positive for acute pulmonary emboli within the lobar and segmental pulmonary arteries in the right upper and lower lobes. No evidence of left-sided pulmonary embolus and no evidence of right heart strain. 2. Cardiomegaly with both left ventricular dilatation and concentric hypertrophy of the left  ventricular myocardium. 3. Surgical changes of multivessel coronary artery bypass procedure. 4. Moderately large layering left pleural effusion with associated left lower lobe atelectasis. These results were called by telephone at the time of interpretation on 03/02/2015 at 5:03 pm to Dr. Wyvonnia Dusky, who verbally acknowledged these results.   Electronically Signed   By: Jacqulynn Cadet M.D.   On: 03/02/2015 17:05   US Renal  02/10/2015   CLINICAL DATA:  Chronic kidney disease.  EXAM: RENAL / URINARY TRACT ULTRASOUND COMPLETE  COMPARISON:  None.  FINDINGS: Right Kidney:  Length: 12.6 cm. Cortical thinning. Diffusely increased echotexture. No focal abnormality or hydronephrosis.  Left Kidney:  Length: 12.3 cm. Cortical thinning. Diffusely increased echotexture. Multiple cysts, the largest in the lower pole measuring 4.4 cm. These appear simple. No hydronephrosis.  Bladder:  Appears normal for degree of bladder distention.  IMPRESSION: Cortical thinning with increased echotexture bilaterally. Findings suggestive of chronic medical renal disease. No hydronephrosis.  Electronically Signed   By: Rolm Baptise M.D.   On: 02/10/2015 09:33   US Venous Img Lower Bilateral  03/04/2015   CLINICAL DATA:  Bilateral lower extremity swelling.  EXAM: BILATERAL LOWER EXTREMITY VENOUS DOPPLER ULTRASOUND  TECHNIQUE: Gray-scale sonography with graded compression, as well as color Doppler and duplex ultrasound were performed to evaluate the lower extremity deep venous systems from the level of the common femoral vein and including the common femoral, femoral, profunda femoral, popliteal and calf veins including the posterior tibial, peroneal and gastrocnemius veins when visible. The superficial great saphenous vein was also interrogated. Spectral Doppler was utilized to evaluate flow at rest and with distal augmentation maneuvers in the common femoral, femoral and popliteal veins.  COMPARISON:  None.  FINDINGS: RIGHT LOWER EXTREMITY   Common Femoral Vein: No evidence of thrombus. Normal compressibility, respiratory phasicity and response to augmentation.  Saphenofemoral Junction: No evidence of thrombus. Normal compressibility and flow on color Doppler imaging.  Profunda Femoral Vein: No evidence of thrombus. Normal compressibility and flow on color Doppler imaging.  Femoral Vein: No evidence of thrombus. Normal compressibility, respiratory phasicity and response to augmentation.  Popliteal Vein: No evidence of thrombus. Normal compressibility, respiratory phasicity and response to augmentation.  Calf Veins: No evidence of thrombus. Normal compressibility and flow on color Doppler imaging.  Superficial Great Saphenous Vein: No evidence of thrombus. Normal compressibility and flow on color Doppler imaging.  Venous Reflux:  None.  Other Findings:  None.  LEFT LOWER EXTREMITY  Common Femoral Vein: No evidence of thrombus. Normal compressibility, respiratory phasicity and response to augmentation.  Saphenofemoral Junction: No evidence of thrombus. Normal compressibility and flow on color Doppler imaging.  Profunda Femoral Vein: No evidence of thrombus. Normal compressibility and flow on color Doppler imaging.  Femoral Vein: No evidence of thrombus. Normal compressibility, respiratory phasicity and response to augmentation.  Popliteal Vein: No evidence of thrombus. Normal compressibility, respiratory phasicity and response to augmentation.  Calf Veins: No evidence of thrombus. Normal compressibility and flow on color Doppler imaging.  Superficial Great Saphenous Vein: No evidence of thrombus. Normal compressibility and flow on color Doppler imaging.  Venous Reflux:  None.  Other Findings:  None.  IMPRESSION: No evidence of deep venous thrombosis.   Electronically Signed   By: [place]   On: 03/04/2015 10:42   Dg Chest Port 1 View  03/02/2015   CLINICAL DATA:  Chest pain.  New onset lower chest pain.  EXAM: PORTABLE CHEST - 1 VIEW   COMPARISON:  02/22/2015.  FINDINGS: Cardiopericardial silhouette is enlarged. The size of the cardiopericardial silhouette is accentuated both by apical lordotic projection and some rotation on this examination. That is head, of the cardiopericardial silhouette is larger than on the prior exam 02/22/2015. There is LEFT basilar airspace disease and atelectasis. Probable LEFT pleural effusion. RIGHT lung base appears within normal limits allowing for and a penetration on this exam. Monitoring leads project over the chest. CABG/median sternotomy.  IMPRESSION: Cardiomegaly with new LEFT basilar density likely representing a combination of atelectasis, effusion and asymmetric pulmonary edema.   Electronically Signed   By: Dereck Ligas M.D.   On: 03/02/2015 14:53   Dg Chest Port 1 View  02/20/2015   CLINICAL DATA:  Status post CABG on February 17, 2015  EXAM: PORTABLE CHEST - 1 VIEW  COMPARISON:  Portable chest x-ray of February 19, 2015  FINDINGS: The cardiopericardial silhouette remains enlarged. The pulmonary vascularity is mildly prominent centrally. The left hemidiaphragm is  less well demonstrated today. There is a small left pleural effusion. The right lung is clear. There are 7 intact sternal wires. The right internal jugular venous catheter tip projects over the midportion of the SVC.  IMPRESSION: Slight interval increase in parenchymal density at the left lung base suggests subsegmental atelectasis and/or a small pleural effusion. There is stable enlargement of the cardiac silhouette with mild central pulmonary vascular prominence.   Electronically Signed   By: David  Martinique M.D.   On: 02/20/2015 07:56   Dg Chest Port 1 View  02/19/2015   CLINICAL DATA:  Status post CABG 2 days ago  EXAM: PORTABLE CHEST - 1 VIEW  COMPARISON:  Portable chest x-ray of February 18, 2015  FINDINGS: The lungs are well-expanded. The interstitial markings have improved. The left-sided chest tube is unchanged in position. There is no  pneumothorax or significant pleural effusion. The cardiac silhouette remains enlarged. The pulmonary vascularity is normal. There are 7 intact sternal wires. The right internal jugular venous catheter tip projects over the midportion of the SVC.  IMPRESSION: Improved appearance of the pulmonary interstitium and decreased left pleural effusion. The support tubes are stable.   Electronically Signed   By: David  Martinique M.D.   On: 02/19/2015 07:52   Dg Chest Port 1 View  02/18/2015   CLINICAL DATA:  coronary artery bypass grafting.  EXAM: PORTABLE CHEST - 1 VIEW  COMPARISON:  02/17/2015.  FINDINGS: Support apparatus: Interval extubation. Removal of enteric tube. Mediastinal drain remains present. RIGHT IJ central line remains present. The Swan-Ganz catheter has been removed. LEFT subclavian vascular sheath remains present. LEFT thoracostomy tube remains present. Monitoring leads project over the chest.  Cardiomediastinal Silhouette: CABG. Mildly enlarged. Size accentuated by lower volumes on today's exam.  Lungs: Basilar atelectasis, expected consequence of extubation. No pneumothorax.  Effusions:  None visible.  Other:  None.  IMPRESSION: 1. Interval extubation with expected lower lung volumes. 2. Mild basilar atelectasis. 3. Accentuation of the cardiopericardial silhouette which appears mildly enlarged, likely due to the lower lung volumes.   Electronically Signed   By: Dereck Ligas M.D.   On: 02/18/2015 07:53   Dg Chest Port 1 View  02/17/2015   CLINICAL DATA:  Status post coronary artery bypass graft x5.  EXAM: PORTABLE CHEST - 1 VIEW  COMPARISON:  February 16, 2015.  FINDINGS: Status post coronary artery bypass graft. Endotracheal tube is in grossly good position with distal tip 5.5 cm above the carina. Nasogastric tube is seen entering the stomach. Left subclavian Swan-Ganz catheter is noted with distal tip in the right pulmonary artery. Right internal jugular catheter is noted with distal tip in expected  position of the SVC. Left-sided chest tube is noted without pneumothorax. Mild central pulmonary vascular congestion is noted.  IMPRESSION: No pneumothorax seen status post coronary artery bypass graft. Endotracheal and nasogastric tubes in grossly good position.   Electronically Signed   By: Marijo Conception, M.D.   On: 02/17/2015 14:01   Dg Chest Port 1 View  02/13/2015   CLINICAL DATA:  Acute respiratory failure.  EXAM: PORTABLE CHEST - 1 VIEW  COMPARISON:  02/12/2015.  FINDINGS: Interim removal of endotracheal tube and NG tube. Right IJ line in stable position. Cardiomegaly with bilateral pulmonary interstitial infiltrates, right side greater than left suggesting persistent congestive heart failure. Pneumonia cannot be excluded. Low lung volumes with bibasilar atelectasis. Small right pleural effusion cannot be excluded. No pneumothorax.  IMPRESSION: 1. Interim removal of endotracheal tube and  NG tube. Right IJ line in stable position. 2. Cardiomegaly with persist bilateral pulmonary interstitial infiltrates, right side greater than left. Findings suggest congestive heart failure pulmonary interstitial edema. Pneumonia cannot be excluded. Associated small right pleural effusion. 3. Low lung volumes with bibasilar atelectasis.   Electronically Signed   By: Marcello Moores  Register   On: 02/13/2015 07:43   Dg Chest Port 1 View  02/12/2015   CLINICAL DATA:  Hypoxia  EXAM: PORTABLE CHEST - 1 VIEW  COMPARISON:  February 11, 2015  FINDINGS: Endotracheal tube tip is 4.6 cm above the carina. Nasogastric tube tip and side port are below the diaphragm. Central catheter tip is in the superior vena cava. No pneumothorax. There is patchy interstitial and alveolar opacity throughout the right mid and lower lung zones, stable. There is patchy interstitial opacity in the left lower lobe. The left lung is otherwise clear. Heart is upper normal in size with pulmonary vascularity within normal limits. No adenopathy.  IMPRESSION: Areas of  primarily interstitial opacity in both mid and lower lung zones with patchy airspace disease on the right in these areas. These areas appear stable compared to 1 day prior. No new opacity. No change in cardiac silhouette. Tube and catheter positions as described without pneumothorax. The appearance suggests a degree of congestive heart failure, although superimposed pneumonia may well be present as well.   Electronically Signed   By: Lowella Grip III M.D.   On: 02/12/2015 07:42   Dg Chest Port 1 View  02/11/2015   CLINICAL DATA:  Acute respiratory failure  EXAM: PORTABLE CHEST - 1 VIEW  COMPARISON:  02/11/2015  FINDINGS: Endotracheal tube in good position. NG tube in the stomach. Right jugular catheter tip in the SVC unchanged. No pneumothorax  Bilateral airspace disease with mild interval improvement. No significant effusion.  IMPRESSION: Support lines in good position.  Diffuse bilateral airspace disease with mild interval improvement.   Electronically Signed   By: Franchot Gallo M.D.   On: 02/11/2015 15:08   Dg Chest Port 1 View  02/11/2015   CLINICAL DATA:  Central line placement.  Initial encounter.  EXAM: PORTABLE CHEST - 1 VIEW  COMPARISON:  Chest radiograph performed 02/10/2015  FINDINGS: The patient's endotracheal tube is seen ending 4 cm above the carina. A right IJ line is noted ending about the mid SVC. An enteric tube is noted extending below the diaphragm.  Worsening bibasilar and right mid lung airspace opacities raise concern for worsening pulmonary edema or multifocal pneumonia. No definite pleural effusion or pneumothorax is seen.  The cardiomediastinal silhouette is borderline normal in size. No acute osseous abnormalities are identified. An external pacing pad is noted.  IMPRESSION: 1. Endotracheal tube seen ending 4 cm above the carina. 2. Right IJ line noted ending about the mid SVC. 3. Worsening bibasilar and right mid lung zone airspace opacities raise concern for worsening  pulmonary edema or multifocal pneumonia.   Electronically Signed   By: Garald Balding M.D.   On: 02/11/2015 03:46   Portable Chest Xray  02/10/2015   CLINICAL DATA:  Acute respiratory failure  EXAM: PORTABLE CHEST - 1 VIEW  COMPARISON:  02/10/2015  FINDINGS: ET tube tip is above the carina. The heart size appears normal. Nasogastric tube tip is in the stomach. Pulmonary edema pattern is improved from previous exam. Persistent lower lobe interstitial and airspace opacities identified.  IMPRESSION: 1. Interval improvement in pulmonary edema pattern.   Electronically Signed   By: Queen [place]ough.D.  On: 02/10/2015 21:05   Dg Chest Port 1 View  02/10/2015   CLINICAL DATA:  Sudden onset shortness of breath and respiratory distress  EXAM: PORTABLE CHEST - 1 VIEW  COMPARISON:  The 02/06/2015  FINDINGS: Symmetric alveolar and interstitial opacities. There could be small pleural effusions, limited at the right base due to exclusion from view.  Stable mild cardiomegaly. Vascular pedicle widening, increased from previous.  IMPRESSION: CHF with widespread alveolar edema.   Electronically Signed   By: Monte Fantasia M.D.   On: 02/10/2015 18:19   Dg Chest Portable 1 View  02/06/2015   CLINICAL DATA:  Chest pain radiating to both arms beginning today  EXAM: PORTABLE CHEST - 1 VIEW  COMPARISON:  03/20/2012  FINDINGS: Mild enlargement of the cardiomediastinal silhouette with central vascular congestion reidentified. A few interstitial Kerley B-lines are noted at the lung bases which could indicate interstitial edema. No focal pulmonary opacity allowing for technique. No pleural effusion. No acute osseous finding.  IMPRESSION: Mild enlargement of the cardiomediastinal silhouette with suggestion of early interstitial edema. If symptoms persist, consider PA and lateral chest radiographs obtained at full inspiration when the patient is clinically able.   Electronically Signed   By: Conchita Paris M.D.   On: 02/06/2015 16:45    Dg Chest Port 1v Same Day  02/16/2015   CLINICAL DATA:  CHF  EXAM: PORTABLE CHEST - 1 VIEW SAME DAY  COMPARISON:  02/14/2015  FINDINGS: Right central line remains in place, unchanged. Heart is borderline in size. Vascular congestion and interstitial prominence, slightly improved since prior study. No confluent opacity or visible effusions. No acute bony abnormality.  IMPRESSION: Slight improvement in pulmonary edema pattern.   Electronically Signed   By: Rolm Baptise M.D.   On: 02/16/2015 10:00   Dg Abd 2 Views  02/06/2015   CLINICAL DATA:  81 year old male with generalized abdominal pain and chest pain radiating to the arms since 1330 hrs. Initial encounter.  EXAM: ABDOMEN - 2 VIEW  COMPARISON:  Portable chest radiograph 1620 hr today. Lumbar MRI 10/21/2009.  FINDINGS: Upright and supine views of the abdomen and pelvis. Moderate gaseous distension of the stomach. Mild elevation of the left hemidiaphragm. No pneumoperitoneum.  Gas-filled but nondilated small and large bowel loops throughout the abdomen and pelvis. Non obstructed pattern. Osteopenia. Postoperative changes to the proximal right femur. No definite acute osseous abnormality.  IMPRESSION: 1. Gaseous distension of the stomach, and gas throughout nondilated small and large bowel loops. The appearance might reflect ileus but does not suggest a mechanical bowel obstruction. 2. No free air.   Electronically Signed   By: Genevie Ann M.D.   On: 02/06/2015 19:52   Dg Abd Portable 1v  02/08/2015   CLINICAL DATA:  Abdominal distention.  EXAM: PORTABLE ABDOMEN - 1 VIEW  COMPARISON:  February 06, 2015.  FINDINGS: Mild gaseous distention of the stomach is again noted. Stool is noted in the right colon. No abnormal dilatation of large or small bowel is seen on this study. Stool is noted in the rectum. No abnormal calcifications are noted. Status post right hip surgery.  IMPRESSION: No evidence of bowel obstruction or ileus. Mild gastric distention of the stomach is  again noted.   Electronically Signed   By: Marijo Conception, M.D.   On: 02/08/2015 10:49   US Thoracentesis Asp Pleural Space W/img Guide  03/03/2015   CLINICAL DATA:  Thoracentesis.  EXAM: ULTRASOUND GUIDED THORACENTESIS  COMPARISON:  CT 03/02/2015 .  PROCEDURE: An ultrasound guided thoracentesis was thoroughly discussed with the patient and questions answered. The benefits, risks, alternatives and complications were also discussed. The patient understands and wishes to proceed with the procedure. Written consent was obtained.  Ultrasound was performed to localize and mark an adequate pocket of fluid in the left chest. The area was then prepped and draped in the normal sterile fashion. 1% Lidocaine was used for local anesthesia. Under ultrasound guidance a 19 gauge Yueh catheter was introduced. Thoracentesis was performed. The catheter was removed and a dressing applied.  Complications:  None.  FINDINGS: A total of approximately 1000 cc of clear yellow fluid was removed. A fluid sample wassent for laboratory analysis.  IMPRESSION: Successful ultrasound guided left thoracentesis yielding a 1000 cc of pleural fluid.   Electronically Signed   By: Marcello Moores  Register   On: 03/03/2015 14:12    Microbiology: Recent Results (from the past 240 hour(s))  Culture, body fluid-bottle     Status: None (Preliminary result)   Collection Time: 03/03/15  2:00 PM  Result Value Ref Range Status   Specimen Description FLUID LEFT PLEURAL  Final   Special Requests BOTTLES DRAWN AEROBIC AND ANAEROBIC 10CC EACH  Final   Culture NO GROWTH < 24 HOURS  Final   Report Status PENDING  Incomplete  Gram stain     Status: None   Collection Time: 03/03/15  2:00 PM  Result Value Ref Range Status   Specimen Description FLUID LEFT PLEURAL  Final   Special Requests NONE  Final   Gram Stain   Final    CYTOSPIN SMEAR WBC PRESENT,BOTH PMN AND MONONUCLEAR NO ORGANISMS SEEN Performed at [facility]    Report Status 03/03/2015  FINAL  Final     Labs: Basic Metabolic Panel:  Recent Labs Lab 03/02/15 0630 03/02/15 1430 03/04/15 0530  NA 142 140 140  K 4.4 5.2* 4.1  CL 105 103 105  CO2 29 30 28   GLUCOSE 52* 157* 164*  BUN 36* 38* 34*  CREATININE 1.53* 1.53* 1.83*  CALCIUM 9.9 9.6 9.1   Liver Function Tests:  Recent Labs Lab 03/03/15 1455  PROT 6.2*   CBC:  Recent Labs Lab 03/02/15 0630 03/02/15 1430 03/03/15 0148 03/04/15 0530  WBC 9.0 9.9 9.4 8.8  NEUTROABS  --  7.4  --   --   HGB 9.7* 10.0* 9.8* 10.1*  HCT 31.1* 30.8* 30.7* 31.9*  MCV 93.1 92.5 91.6 93.8  PLT 359 345 346 320   Cardiac Enzymes:  Recent Labs Lab 03/02/15 1430 03/02/15 1719 03/02/15 2000 03/02/15 2255  TROPONINI 0.09* 0.09* 0.10* 0.11*   BNP: BNP (last 3 results)  Recent Labs  02/06/15 1612  BNP 279.0*    CBG:  Recent Labs Lab 03/04/15 0742 03/04/15 1125 03/04/15 1652 03/04/15 2042 03/05/15 0739  GLUCAP 162* 147* 138* 177* 69       Signed:  BLACK,KAREN M  [facility] 03/05/2015, 10:16 AM   Patient was seen, examined,treatment plan was discussed with the Advance Practice Provider.  I have directly reviewed the clinical findings, lab, imaging studies and management of this patient in detail. I have made the necessary changes to the above noted documentation, and agree with the documentation, as recorded by the Advance Practice Provider.     Marzetta Board, MD [facility] [phone]

## 2015-03-06 ENCOUNTER — Non-Acute Institutional Stay (SKILLED_NURSING_FACILITY): Payer: Medicare Other | Admitting: Internal Medicine

## 2015-03-06 ENCOUNTER — Encounter: Payer: Self-pay | Admitting: Internal Medicine

## 2015-03-06 DIAGNOSIS — N183 Chronic kidney disease, stage 3 (moderate): Secondary | ICD-10-CM

## 2015-03-06 DIAGNOSIS — N179 Acute kidney failure, unspecified: Secondary | ICD-10-CM

## 2015-03-06 DIAGNOSIS — I5043 Acute on chronic combined systolic (congestive) and diastolic (congestive) heart failure: Secondary | ICD-10-CM | POA: Diagnosis not present

## 2015-03-06 DIAGNOSIS — E11311 Type 2 diabetes mellitus with unspecified diabetic retinopathy with macular edema: Secondary | ICD-10-CM

## 2015-03-06 DIAGNOSIS — I2699 Other pulmonary embolism without acute cor pulmonale: Secondary | ICD-10-CM

## 2015-03-06 NOTE — Progress Notes (Signed)
Patient ID: Zachary Mcmahon, male   DOB: 1932/04/12, 81 y.o.   MRN: [id number]     Facility; [facility] This is an acute visit  Chief complaint-- Acute visit status post hospitalization for pulmonary embolism   HPI; this is an 81 year old man who lives  with his wife. In fact he is her caregiver as she suffers from Alzheimer's disease. On previous hospital admission  seen at the [facility] with complaints of chest pain. He was given nitroglycerin in the emergency department with relief. EKG showed no ST changes but he rolled and with cardiac enzymes for a non-ST elevation MI. He was transferred to [facility]. He underwent cardiac catheterization which showed severe multivessel coronary artery disease but a preserved EF. Preparation was made for bypass surgery. However there are issues with regards to vein harvest. He had a duplex ultrasound that was negative for DVT however his left greater saphenous vein and its branches had reflux post phlebitic syndrome and superficial thrombophlebitis of the left saphenous vein. He then developed further chest pain. He required BiPAP nitroglycerin Lasix and lipase to low. He spiked a temperature to 102. He required intubation, extubated on 6/23. He received IV anabiotic's and his fever and leukocytosis slowly resolved. He then went on to have his CABG on 02/17/15 this was a CABG 5. He had an endoscopic harvest of the greater saphenous vein from the right leg. Mild elevation of his creatinine postoperatively. He was tachycardic and hypertensive and his Lopressor was increased. His discharge creatinine was 1.64. This appears to be his relatively baseline.  Always recent admission he was sent back to the hospital with complaints of chest pain-CT of the chest showed embolism in the right upper and lower lobes likely provoked from recent surgery-she was started on aspirin this was switched to Levaquin as-bilateral venous Dopplers of lower extremities were negative for  DVT.  Cardiology thought there was no further ischemic workup warranted.  He did undergo a thoracentesis secondary to a left pleural effusion.  Regards coronary artery disease status post CABG he continued with aspirin atorvastatin and metoprolol.  He is a type II diabetic in this appears stable with CBGs in the hospital 69 and 170 this appears to be roughly what he has been running here as well his Levemir dose was decreased on discharge.  Again he does have baseline chronic kidney disease stage III this was relatively baseline I note discharge creatinine was 1.83.    Past Medical History  Diagnosis Date  . Hypertension   . High potassium   . Varicose veins   . Hyperlipidemia   . Basal cell carcinoma of forehead 2016 X 2  . Basal cell carcinoma of left earlobe   . Old myocardial infarct     "sometime in the past; don't know when" (02/07/2015)  . Type II diabetes mellitus   . Pneumonia X 1  . GERD (gastroesophageal reflux disease)   . Arthritis     "legs" (02/07/2015)  . History of gout X 1  . Diabetic peripheral neuropathy     "left foot" (02/07/2015)   Past Surgical History  Procedure Laterality Date  . Knee arthroscopy Right ~ 2008  . Orif hip fracture  03/20/2012    Procedure: OPEN REDUCTION INTERNAL FIXATION HIP;  Surgeon: Sanjuana Kava, MD;  Location: [facility];  Service: Orthopedics;  Laterality: Right;  . Lower extremity venous doppler  07/11/2012    No evidence of DVT in the left lower extremity. Evidence of partially recanalized,  chronic, non-obstructive thrombus in the left great SV and its branches consistent with significant reflux consistent with post-phlebitic syndrome. Significant reflux of the left short saphenous vein.  . Cardiovascular stress test  05/29/2012    Mild-moderate perfusion defect due to infarct/scar with mild-moderate perinfarct ischemia seen in the mid anterior, apical anterior, apical septal, and apical regions. No ECG changes. Global LV systolic  function is severely reduced.  . Transthoracic echocardiogram  04/18/2012    EF 45%, mild-moderate LVH  . Transthoracic echocardiogram  04/18/12    EF% 45%.SEVERE HYPOKINESIS TO AKINESIS OF THE MID-DISTAL INFEROLATERAL MYOCARDIUM AND MUCH OF THE APEX.  Marland Kitchen Lexiscam myocardial perfusion  05/29/12    MARKED PERFUSION DEFECT DUE TO INFARC/SCAR WITH MILD PERINFARCT ISCHEMIA IN THE BASAL INFERIOR, MID INFEROSEPTAL, MID INFERIOR AND APICALINFERIOR REGION. EF%33%. PERIINFARCT ISCHEMIA IN THE MID ANTERIOR, APICAL ANTERIOR, APICAL SEPTAL AND APICAL REGIONS.  . Fracture surgery    . Cystoscopy w/ stone manipulation  X 1  . Basal cell carcinoma excision      "probably 1/2 dozen cut off face, left ear" (02/07/2015)  . Cardiac catheterization N/A 02/09/2015    Procedure: Left Heart Cath and Coronary Angiography;  Surgeon: Leonie Man, MD;  Location: [facility];  Service: Cardiovascular;  Laterality: N/A;  . Coronary artery bypass graft N/A 02/17/2015    Procedure: CORONARY ARTERY BYPASS GRAFTING (CABG), ON PUMP, TIMES FIVE, USING LEFT INTERNAL MAMMARY ARTERY, RIGHT GREATER SAPHENOUS VEIN HARVESTED ENDOSCOPICALLY;  Surgeon: [name], MD;  Location: [place];  Service: Open Heart Surgery;  Laterality: N/A;  -LIMA to LAD -SVG to DIAGONAL - SEQ SVG to OM1 and PLB -SVG to PDA  . Tee without cardioversion N/A 02/17/2015    Procedure: TRANSESOPHAGEAL ECHOCARDIOGRAM (TEE);  Surgeon: [name], MD;  Location: [place];  Service: Open Heart Surgery;  Laterality: N/A;    Current Outpatient Prescriptions on File Prior to Visit  Medication Sig Dispense Refill  . aspirin EC 81 MG tablet Take 81 mg by mouth every morning.    Marland Kitchen atorvastatin (LIPITOR) 20 MG tablet TAKE 1 TABLET BY MOUTH DAILY AT 6:00 P.M. ( MUST SCHEDULE AN APPOINTMENT FOR FUTURE REFILLS ) 30 tablet 0  . cloNIDine (CATAPRES) 0.2 MG tablet Take 0.2 mg by mouth 2 (two) times daily.    . ferrous sulfate 325 (65 FE) MG tablet Take 1 tablet  (325 mg total) by mouth daily with breakfast.  3  . furosemide (LASIX) 40 MG tablet Take 1 tablet (40 mg total) by mouth daily. 30 tablet   . gabapentin (NEURONTIN) 100 MG capsule Take 100 mg by mouth 3 (three) times daily.     Marland Kitchen LEVEMIR FLEXTOUCH 100 UNIT/ML Pen Inject 27 Units into the skin at bedtime.     . metoprolol tartrate (LOPRESSOR) 25 MG tablet Take 1 tablet (25 mg total) by mouth 2 (two) times daily.    Marland Kitchen omeprazole (PRILOSEC) 20 MG capsule Take 20 mg by mouth daily.     . potassium chloride SA (K-DUR,KLOR-CON) 10 MEQ tablet Take 1 tablet (10 mEq total) by mouth daily.    . traMADol (ULTRAM) 50 MG tablet Take 1 tablet (50 mg total) by mouth every 6 (six) hours as needed for moderate pain. 30 tablet 0  Of note he is on Eliquis 10 mg twice a day for 5 more  days this will be reduced to 5 mg twice a day on July 20  Social; patient lives with his wife. She  has Alzheimer's disease there is a caregiver in the home. I think he was independent with ADLs and IDL's. He was giving his own insulin Levemir  History   Social History  . Marital Status: Married    Spouse Name: N/A  . Number of Children: N/A  . Years of Education: N/A   Occupational History  . Not on file.   Social History Main Topics  . Smoking status: Never Smoker   . Smokeless tobacco: Never Used  . Alcohol Use: No  . Drug Use: No  . Sexual Activity: Not Currently   Other Topics Concern  . Not on file    Family History  Problem Relation Age of Onset  . Deep vein thrombosis Mother   . Hypertension Mother   . Hypertension Sister   . Diabetes Brother   . Hyperlipidemia Brother   . Hypertension Brother   . Heart attack Brother     Review of systems Gen. patient states he feels  Well Skin does not complain of rashes or itching.  Head ears eyes nose mouth and throat does not complain of any difficulty swallowing or visual changes  Respiratory no cough no shortness of breath Cardiac not complaining of chest  pain  GI does not complain of any nausea vomiting diarrhea or constipation or abdominal discomfort.  GU does not complain of dysuria.  Muscle skeletal does have a history of lower extremity weakness largely ambulates in wheelchair has complained of foot numbness at times as well.  Psych does not complain of anxiety or depression appears to be in good spirits    Physical examination Temperature is 97.9 pulse 84 respirations 20 blood pressure 126/86  Gen. patient is not in any distress less and talkative.  His skin is warm and dry he does have a well-healed mid chest scar area  Eyes pupils appear reactive light visual acuity appears grossly intact sclera are clear--he has prescription lenses.  Oropharynx is clear mucous membranes moist.   Respiratory clear entry bilaterallywith somewhat shallow air entry  Cardiac;  Regular rate and rhythm without murmur gallop or rub he does have some mild lower extremity edema this is greater on the left leg-decreased pedal pulses bilaterally  Abdomen;Soft nontender positive bowel sounds  slight umbilicus hernia GU no bladder distention or suprapubic tenderness Extremities slight venous stasis of physiology. suspect he does have superficial thrombophlebitis  . There is no evidence of a DVT He is ambulating in a wheelchair moves all extremities 4  Circulation; peripheral pulses are reduced in his feet Neurologic; somewhat bradykinetic in his movements  I do not see any lateralizing findings her speech is clear.  Psych he is alert and oriented pleasant and very sociable and talkative I see no abnormalities here  Labs.  03/04/2015.  Sodium 140 potassium 4.1 BUN 34 creatinine 1.83.  WBC 8.8 hemoglobin 10.1 platelets 320.    Impression/plan #1 pulmonary embolism again this is a new diagnosis he continues on Eliquis higher dose here for 5 more days and then will be reduced to 5 mg twice a day--appears stable does not complain of any shortness  of breath or chest pain 2 coronary artery disease status post CABG 5. His chest incision is well-healed this appears stable--he continues on aspirin a statin and metoprolol . #3 history of congestive heart failure  At this point appears stable he is on Lasix with potassium supplementation continue to monitor closely. #4 type 2 diabetes with probable neuropathy macrovascular disease, and neuropathy he is  on Levemir 27nits at bedtime . CBGs appear to be fairly well controlled in the hospital although there was a 69-there was a 69 this morning his Levemir has just been decreased by the hospitalist at this point will monitor  #5 diabetic neuropathic pain on Neurontin. #6hyperlipidemia on Lipitor or this could be secondary prophylaxis # hypertension-again minimal reading so far appears to be stable at 126/86 he is on clonidine-as well as metoprolol.  #8-chronic any disease this appears relatively baseline with creatinine of 1.83 BUN of 34 on lab July 13 will update this tomorrow.  #9-history of anemia suspect chronic disease with renal insufficiency-this appears relatively stable we will update this as well he is on iron.  [id number] note greater than 40 minutes spent assessing patient-reviewing his chart-and coordinating formulating a plan of care for numerous diagnoses-of note greater than 50% of time spent coordinating plan of care      Patient:    Zachary Mcmahon, Zachary Mcmahon MR #:       [id number] Study Date: 02/11/2015 Gender:     M Age:        82 Height:     175.3 cm Weight:     93.9 kg BSA:        2.16 m^2 Pt. Status: Room:       [facility]   Ivar Bury, MD  [place], MD  ATTENDING    [place], [place], [place], Inpatient  SONOGRAPHER  Roseanna Rainbow  cc:  ------------------------------------------------------------------- LV EF: 45% -    50%  ------------------------------------------------------------------- Indications:      Cardiomyopathy - ischemic 414.8.  ------------------------------------------------------------------- History:   PMH:  Limited echo to assess wall motion. Study prior to CABG. CKD. NSTEMI.  Coronary artery disease.  Angina pectoris. Risk factors:  Hypertension. Diabetes mellitus. Dyslipidemia.  ------------------------------------------------------------------- Study Conclusions  - Left ventricle: Infeior and septal hypokinesis The cavity size   was mildly dilated. Wall thickness was increased in a pattern of   moderate LVH. Systolic function was mildly reduced. The estimated   ejection fraction was in the range of 45% to 50%. - Mitral valve: There was mild regurgitation. - Atrial septum: Non mobile atrial septal aneurysm No obvious PFO   seen but not well interrogated - Pulmonary arteries: PA peak pressure: 51 mm Hg (S).  Transthoracic echocardiography.  M-mode, limited 2D, limited spectral Doppler, and color Doppler.  Birthdate:  Patient birthdate: November 24, 1931.  Age:  Patient is 81 yr old.  Sex:  Gender: male.    BMI: 30.6 kg/m^2.  Blood pressure:     133/45  Patient status:  Inpatient.  Study date:  Study date: 02/11/2015. Study time: 03:15 PM.  Location:  ICU/CCU  -------------------------------------------------------------------  ------------------------------------------------------------------- Left ventricle:  Infeior and septal hypokinesis The cavity size was mildly dilated. Wall thickness was increased in a pattern of moderate LVH. Systolic function was mildly reduced. The estimated ejection fraction was in the range of 45% to 50%.  ------------------------------------------------------------------- Aortic valve:   Mildly thickened leaflets.  Doppler:   There was no stenosis.  ------------------------------------------------------------------- Mitral valve:   Mildly thickened  leaflets .  Doppler:  There was mild regurgitation.  ------------------------------------------------------------------- Left atrium:  The atrium was normal in size.  ------------------------------------------------------------------- Atrial septum:  Non mobile atrial septal aneurysm No obvious PFO seen but not well interrogated  ------------------------------------------------------------------- Right ventricle:  The cavity size was normal. Wall  thickness was normal. Systolic function was normal.  ------------------------------------------------------------------- Pulmonic valve:    Doppler:  There was trivial regurgitation.  ------------------------------------------------------------------- Tricuspid valve:   Doppler:  There was mild regurgitation.  ------------------------------------------------------------------- Right atrium:  The atrium was normal in size.  ------------------------------------------------------------------- Pericardium:  The pericardium was normal in appearance.  ------------------------------------------------------------------- Measurements   CBC Latest Ref Rng 02/22/2015 02/21/2015 02/20/2015  WBC 4.0 - 10.5 K/uL 13.2(H) 14.5(H) 15.3(H)  Hemoglobin 13.0 - 17.0 g/dL 8.8(L) 9.1(L) 9.1(L)  Hematocrit 39.0 - 52.0 % 27.2(L) 28.4(L) 28.9(L)  Platelets 150 - 400 K/uL 249 213 186    Lab Results  Component Value Date   CREATININE 1.64* 02/23/2015   CREATININE 1.76* 02/22/2015   CREATININE 1.81* 02/21/2015

## 2015-03-07 ENCOUNTER — Encounter (HOSPITAL_COMMUNITY)
Admission: RE | Admit: 2015-03-07 | Payer: Medicare Other | Source: Skilled Nursing Facility | Admitting: Internal Medicine

## 2015-03-07 LAB — BASIC METABOLIC PANEL
ANION GAP: 7 (ref 5–15)
BUN: 32 mg/dL — ABNORMAL HIGH (ref 6–20)
CHLORIDE: 105 mmol/L (ref 101–111)
CO2: 29 mmol/L (ref 22–32)
CREATININE: 1.7 mg/dL — AB (ref 0.61–1.24)
Calcium: 9.6 mg/dL (ref 8.9–10.3)
GFR calc non Af Amer: 36 mL/min — ABNORMAL LOW (ref >60)
GFR, EST AFRICAN AMERICAN: 42 mL/min — AB (ref >60)
Glucose, Bld: 91 mg/dL (ref 65–99)
POTASSIUM: 4.5 mmol/L (ref 3.5–5.1)
SODIUM: 141 mmol/L (ref 135–145)

## 2015-03-07 LAB — CBC WITH DIFFERENTIAL/PLATELET
Basophils Absolute: 0 10*3/uL (ref 0.0–0.1)
Basophils Relative: 1 % (ref 0–1)
EOS ABS: 0.7 10*3/uL (ref 0.0–0.7)
Eosinophils Relative: 9 % — ABNORMAL HIGH (ref 0–5)
HEMATOCRIT: 33.3 % — AB (ref 39.0–52.0)
HEMOGLOBIN: 10.2 g/dL — AB (ref 13.0–17.0)
Lymphocytes Relative: 16 % (ref 12–46)
Lymphs Abs: 1.3 10*3/uL (ref 0.7–4.0)
MCH: 29.2 pg (ref 26.0–34.0)
MCHC: 30.6 g/dL (ref 30.0–36.0)
MCV: 95.4 fL (ref 78.0–100.0)
MONO ABS: 0.9 10*3/uL (ref 0.1–1.0)
MONOS PCT: 11 % (ref 3–12)
Neutro Abs: 5.3 10*3/uL (ref 1.7–7.7)
Neutrophils Relative %: 63 % (ref 43–77)
Platelets: 275 10*3/uL (ref 150–400)
RBC: 3.49 MIL/uL — ABNORMAL LOW (ref 4.22–5.81)
RDW: 14.2 % (ref 11.5–15.5)
WBC: 8.3 10*3/uL (ref 4.0–10.5)

## 2015-03-08 ENCOUNTER — Non-Acute Institutional Stay (SKILLED_NURSING_FACILITY): Payer: Medicare Other | Admitting: Internal Medicine

## 2015-03-08 DIAGNOSIS — E1121 Type 2 diabetes mellitus with diabetic nephropathy: Secondary | ICD-10-CM

## 2015-03-08 DIAGNOSIS — R269 Unspecified abnormalities of gait and mobility: Secondary | ICD-10-CM

## 2015-03-08 DIAGNOSIS — I5043 Acute on chronic combined systolic (congestive) and diastolic (congestive) heart failure: Secondary | ICD-10-CM

## 2015-03-08 DIAGNOSIS — Z951 Presence of aortocoronary bypass graft: Secondary | ICD-10-CM | POA: Diagnosis not present

## 2015-03-08 DIAGNOSIS — I2699 Other pulmonary embolism without acute cor pulmonale: Secondary | ICD-10-CM

## 2015-03-08 NOTE — Progress Notes (Signed)
Patient ID: Zachary Mcmahon, male   DOB: 08-06-1932, 81 y.o.   MRN: [id number] [facility] Chief complaint; readmission to the facility postdate [facility] through 7/14 History; this is a patient who is rehabilitating here after a CABG 5. He was working in physical therapy and developed sharp chest pain that elected later changed to upper abdominal pain. I did an EKG on him here I thought he had the new with a T wave inversions across the anterior leads and send him to the ER. His troponins were minimally elevated however this was felt to be secondary to his previous CABG. His CT CAT scan of the chest that documented pulmonary emboli. He was started on a heparin drip and then transitioned to Eliquis. He has tolerated this well. Bilateral duplex ultrasounds of his legs were negative for DVTs. Patient was also noted to have a left pleural effusion he underwent a thoracentesis of 1000 mL subpleural fluid. His echocardiogram showed improved cardiac function         Study Result       CLINICAL DATA:  81 year old male with chest pain and shortness of breath which developed while in rehab earlier today   EXAM: CT ANGIOGRAPHY CHEST WITH CONTRAST   TECHNIQUE: Multidetector CT imaging of the chest was performed using the standard protocol during bolus administration of intravenous contrast. Multiplanar CT image reconstructions and MIPs were obtained to evaluate the vascular anatomy.   CONTRAST:  172mL OMNIPAQUE IOHEXOL 350 MG/ML SOLN   COMPARISON:  Chest x-ray obtained earlier today   FINDINGS: Mediastinum: Unremarkable CT appearance of the thyroid gland. No suspicious mediastinal or hilar adenopathy. No soft tissue mediastinal mass. The thoracic esophagus is unremarkable.   Heart/Vascular: Adequate opacification of the pulmonary arteries to the proximal subsegmental level. Filling defects are present within the right lobar pulmonary artery extending into segmental  branches. Additionally, there are branches within segmental pulmonary arteries in the right middle lobe and within the right lower lobar pulmonary artery extending into segmental branches. No definite pulmonary embolus identified on the left. The heart is enlarged and there is both left ventricular dilatation and concentric hypertrophy of the left ventricular myocardium. Normal RV/LV ratio. No evidence of right heart strain. Atherosclerotic calcifications throughout the coronary arteries. Patient is status post median sternotomy with evidence of CABG. No pericardial effusion.   Lungs/Pleura: Moderately large layering left pleural effusion with associated left basilar atelectasis. Trace dependent atelectasis in the right lower lobe. No evidence of pulmonary infarct.   Bones/Soft Tissues: No acute fracture or aggressive appearing lytic or blastic osseous lesion.   Upper Abdomen: Visualized upper abdominal organs are unremarkable.   Review of the MIP images confirms the above findings.   IMPRESSION: 1. Positive for acute pulmonary emboli within the lobar and segmental pulmonary arteries in the right upper and lower lobes. No evidence of left-sided pulmonary embolus and no evidence of right heart strain. 2. Cardiomegaly with both left ventricular dilatation and concentric hypertrophy of the left ventricular myocardium. 3. Surgical changes of multivessel coronary artery bypass procedure. 4. Moderately large layering left pleural effusion with associated left lower lobe atelectasis. These results were called by telephone at the time of interpretation on 03/02/2015 at 5:03 pm to Dr. Wyvonnia Dusky, who verbally acknowledged these results.     Electronically Signed   By: Jacqulynn Cadet M.D.   On: 03/02/2015 17:05        Past Medical History  Diagnosis Date  . Hypertension   . High potassium   .  Varicose veins   . Hyperlipidemia   . Basal cell carcinoma of forehead 2016 X 2  .  Basal cell carcinoma of left earlobe   . Old myocardial infarct     "sometime in the past; don't know when" (02/07/2015)  . Type II diabetes mellitus   . Pneumonia X 1  . GERD (gastroesophageal reflux disease)   . Arthritis     "legs" (02/07/2015)  . History of gout X 1  . Diabetic peripheral neuropathy     "left foot" (02/07/2015)  . Respiratory failure   . Pain and swelling of left lower leg     chronic    Past Surgical History  Procedure Laterality Date  . Knee arthroscopy Right ~ 2008  . Orif hip fracture  03/20/2012    Procedure: OPEN REDUCTION INTERNAL FIXATION HIP;  Surgeon: Sanjuana Kava, MD;  Location: [facility];  Service: Orthopedics;  Laterality: Right;  . Lower extremity venous doppler  07/11/2012    No evidence of DVT in the left lower extremity. Evidence of partially recanalized, chronic, non-obstructive thrombus in the left great SV and its branches consistent with significant reflux consistent with post-phlebitic syndrome. Significant reflux of the left short saphenous vein.  . Cardiovascular stress test  05/29/2012    Mild-moderate perfusion defect due to infarct/scar with mild-moderate perinfarct ischemia seen in the mid anterior, apical anterior, apical septal, and apical regions. No ECG changes. Global LV systolic function is severely reduced.  . Transthoracic echocardiogram  04/18/2012    EF 45%, mild-moderate LVH  . Transthoracic echocardiogram  04/18/12    EF% 45%.SEVERE HYPOKINESIS TO AKINESIS OF THE MID-DISTAL INFEROLATERAL MYOCARDIUM AND MUCH OF THE APEX.  Marland Kitchen Lexiscam myocardial perfusion  05/29/12    MARKED PERFUSION DEFECT DUE TO INFARC/SCAR WITH MILD PERINFARCT ISCHEMIA IN THE BASAL INFERIOR, MID INFEROSEPTAL, MID INFERIOR AND APICALINFERIOR REGION. EF%33%. PERIINFARCT ISCHEMIA IN THE MID ANTERIOR, APICAL ANTERIOR, APICAL SEPTAL AND APICAL REGIONS.  . Fracture surgery    . Cystoscopy w/ stone manipulation  X 1  . Basal cell carcinoma excision      "probably 1/2  dozen cut off face, left ear" (02/07/2015)  . Cardiac catheterization N/A 02/09/2015    Procedure: Left Heart Cath and Coronary Angiography;  Surgeon: Leonie Man, MD;  Location: [facility];  Service: Cardiovascular;  Laterality: N/A;  . Coronary artery bypass graft N/A 02/17/2015    Procedure: CORONARY ARTERY BYPASS GRAFTING (CABG), ON PUMP, TIMES FIVE, USING LEFT INTERNAL MAMMARY ARTERY, RIGHT GREATER SAPHENOUS VEIN HARVESTED ENDOSCOPICALLY;  Surgeon: [name], MD;  Location: [facility];  Service: Open Heart Surgery;  Laterality: N/A;  -LIMA to LAD -SVG to DIAGONAL - SEQ SVG to OM1 and PLB -SVG to PDA  . Tee without cardioversion N/A 02/17/2015    Procedure: TRANSESOPHAGEAL ECHOCARDIOGRAM (TEE);  Surgeon: [name], MD;  Location: [place] [place];  Service: Open Heart Surgery;  Laterality: N/A;  . Open heart sx  02/17/15   Current Outpatient Prescriptions on File Prior to Visit  Medication Sig Dispense Refill  . apixaban (ELIQUIS) 5 MG TABS tablet Take 2 tablets (10 mg total) by mouth 2 (two) times daily. For 7 days    . [START ON 03/11/2015] apixaban (ELIQUIS) 5 MG TABS tablet Take 1 tablet (5 mg total) by mouth 2 (two) times daily. Starting 03/11/15    . aspirin EC 81 MG tablet Take 81 mg by mouth every morning.    Marland Kitchen atorvastatin (LIPITOR) 20 MG tablet TAKE  1 TABLET BY MOUTH DAILY AT 6:00 P.M. ( MUST SCHEDULE AN APPOINTMENT FOR FUTURE REFILLS ) 30 tablet 0  . cloNIDine (CATAPRES) 0.2 MG tablet Take 0.2 mg by mouth 2 (two) times daily.    . ferrous sulfate 325 (65 FE) MG tablet Take 1 tablet (325 mg total) by mouth daily with breakfast.  3  . furosemide (LASIX) 40 MG tablet Take 1 tablet (40 mg total) by mouth daily. 30 tablet   . gabapentin (NEURONTIN) 100 MG capsule Take 100 mg by mouth 3 (three) times daily.     Marland Kitchen LEVEMIR FLEXTOUCH 100 UNIT/ML Pen Inject 27 Units into the skin at bedtime.    Marland Kitchen LORazepam (ATIVAN) 1 MG tablet Take 1 tablet (1 mg total) by mouth at bedtime as needed  for sleep. 30 tablet 5  . metoprolol tartrate (LOPRESSOR) 25 MG tablet Take 1 tablet (25 mg total) by mouth 2 (two) times daily.    Marland Kitchen omeprazole (PRILOSEC) 20 MG capsule Take 20 mg by mouth daily.     . potassium chloride SA (K-DUR,KLOR-CON) 10 MEQ tablet Take 1 tablet (10 mEq total) by mouth daily.    . traMADol (ULTRAM) 50 MG tablet Take 1 tablet (50 mg total) by mouth every 6 (six) hours as needed for moderate pain. 120 tablet 5   Social; the patient lives in [place]. He lives with his wife who has dementia. There are no stairs. He has both a cane and a walker at home but doesn't use them I don't believe.  reports that he has never smoked. He has never used smokeless tobacco. He reports that he does not drink alcohol or use illicit drugs.  Review of systems Respiratory; patient is complaining of shortness of breath Cardiac no exertional chest pain no GI no abdominal complaints no diarrhea or pain. GU no dysuria.   Physical examination Gen.; the patient is not in any distress. Looks much the same as I am used to seeing him Respiratory clear entry bilaterally Cardiac heart sounds are normal no murmurs no gallops his JVP is not elevated. Abdomen no liver no spleen no tenderness Extremities 2+ pitting edema to the midcalf bilaterally has 1+ sacral edema. Gait; he has somewhat of an ataxic gait is quite valgus in the right knee probably secondary to osteoarthritis.  Impression/plan #1 new-onset pulmonary embolism. His cardiac status seems stable to me. There is no signs of right ventricular pressure overload #2 coronary artery disease status post a CABG 5. This also seems stable. #3 type 2 diabetes on insulin as stage III chronic renal failure. Creatinine 1.62 at baseline. This seems stable his hemoglobin A1c in the hospital was 7.5 I think this is satisfactory. #4 gait ataxia likely secondary to osteoarthritis predominantly in the right knee and diabetic neuropathy. He is  already asking to go home. I would like to discuss this tomorrow with a therapist and see what their feeling is. I also suspect he would need to use a walker at least initially as his gait really is very unsteady. #6 diabetic neuropathy as described

## 2015-03-09 LAB — CULTURE, BODY FLUID-BOTTLE: CULTURE: NO GROWTH

## 2015-03-10 ENCOUNTER — Encounter: Payer: Self-pay | Admitting: Internal Medicine

## 2015-03-10 ENCOUNTER — Non-Acute Institutional Stay (SKILLED_NURSING_FACILITY): Payer: Medicare Other | Admitting: Internal Medicine

## 2015-03-10 DIAGNOSIS — I2699 Other pulmonary embolism without acute cor pulmonale: Secondary | ICD-10-CM | POA: Diagnosis not present

## 2015-03-10 DIAGNOSIS — I10 Essential (primary) hypertension: Secondary | ICD-10-CM | POA: Diagnosis not present

## 2015-03-10 DIAGNOSIS — I214 Non-ST elevation (NSTEMI) myocardial infarction: Secondary | ICD-10-CM | POA: Diagnosis not present

## 2015-03-10 DIAGNOSIS — N183 Chronic kidney disease, stage 3 unspecified: Secondary | ICD-10-CM

## 2015-03-10 NOTE — Progress Notes (Signed)
Patient ID: Zachary Mcmahon, male   DOB: May 13, 1932, 81 y.o.   MRN: [id number]     Facility; [facility] This is a discharge note  Chief complaint--Discharge note   HPI; this is an 81 year old man who lives  with his wife. In fact he is her caregiver as she suffers from Alzheimer's disease. On previous hospital admission  seen at the [facility] with complaints of chest pain. He was given nitroglycerin in the emergency department with relief. EKG showed no ST changes but he rwas ruled in with cardiac enzymes for a non-ST elevation MI. He was transferred to [facility]. He underwent cardiac catheterization which showed severe multivessel coronary artery disease but a preserved EF. Preparation was made for bypass surgery. However there are issues with regards to vein harvest. He had a duplex ultrasound that was negative for DVT however his left greater saphenous vein and its branches had reflux post phlebitic syndrome and superficial thrombophlebitis of the left saphenous vein. He then developed further chest pain. He required BiPAP nitroglycerin Lasix and lipase to low. He spiked a temperature to 102. He required intubation, extubated on 6/23. He received IV anabiotic's and his fever and leukocytosis slowly resolved. He then went on to have his CABG on 02/17/15 this was a CABG 5. He had an endoscopic harvest of the greater saphenous vein from the right leg. Mild elevation of his creatinine postoperatively. He was tachycardic and hypertensive and his Lopressor was increased. His discharge creatinine was 1.64. This appears to be his relatively baseline.  Most recent admission he was sent back to the hospital with complaints of chest pain-CT of the chest showed embolism in the right upper and lower lobes likely provoked from recent surgery-he was started on aspirin this was switched Eliquis-bilateral venous Dopplers of lower extremities were negative for DVT.  Cardiology thought there was no further ischemic  workup warranted.  He did undergo a thoracentesis secondary to a left pleural effusion.  Regards coronary artery disease status post CABG he continued with aspirin atorvastatin and metoprolol.  He is a type II diabetic in this appears stable with CBGs in the hospital 69 and 170 --CBG since his return hereappear to. Be largely in the 100s  Again he does have baseline chronic kidney disease stage III this was relatively baseline with a creatinine of 1.7 BUN of 32 on July 16. . Patient really wants to go home and he has been slated for discharge tomorrow-he will need continued PT and OT since he has significant weakness from his hospitalizations as well as RN support for his multiple medical issues including cardiac issues and recent pulmonary embolism as noted above.  He does have a rolling walker and wheelchair at home  Clinically he appears to be stable does not complaining any chest pain shortness of breath ambulates about the facility in his wheelchair appears to be doing well in this regards although he still has significant weakness.     Past Medical History  Diagnosis Date  . Hypertension   . High potassium   . Varicose veins   . Hyperlipidemia   . Basal cell carcinoma of forehead 2016 X 2  . Basal cell carcinoma of left earlobe   . Old myocardial infarct     "sometime in the past; don't know when" (02/07/2015)  . Type II diabetes mellitus   . Pneumonia X 1  . GERD (gastroesophageal reflux disease)   . Arthritis     "legs" (02/07/2015)  . History of  gout X 1  . Diabetic peripheral neuropathy     "left foot" (02/07/2015)   Past Surgical History  Procedure Laterality Date  . Knee arthroscopy Right ~ 2008  . Orif hip fracture  03/20/2012    Procedure: OPEN REDUCTION INTERNAL FIXATION HIP;  Surgeon: Sanjuana Kava, MD;  Location: [facility];  Service: Orthopedics;  Laterality: Right;  . Lower extremity venous doppler  07/11/2012    No evidence of DVT in the left lower extremity.  Evidence of partially recanalized, chronic, non-obstructive thrombus in the left great SV and its branches consistent with significant reflux consistent with post-phlebitic syndrome. Significant reflux of the left short saphenous vein.  . Cardiovascular stress test  05/29/2012    Mild-moderate perfusion defect due to infarct/scar with mild-moderate perinfarct ischemia seen in the mid anterior, apical anterior, apical septal, and apical regions. No ECG changes. Global LV systolic function is severely reduced.  . Transthoracic echocardiogram  04/18/2012    EF 45%, mild-moderate LVH  . Transthoracic echocardiogram  04/18/12    EF% 45%.SEVERE HYPOKINESIS TO AKINESIS OF THE MID-DISTAL INFEROLATERAL MYOCARDIUM AND MUCH OF THE APEX.  Marland Kitchen Lexiscam myocardial perfusion  05/29/12    MARKED PERFUSION DEFECT DUE TO INFARC/SCAR WITH MILD PERINFARCT ISCHEMIA IN THE BASAL INFERIOR, MID INFEROSEPTAL, MID INFERIOR AND APICALINFERIOR REGION. EF%33%. PERIINFARCT ISCHEMIA IN THE MID ANTERIOR, APICAL ANTERIOR, APICAL SEPTAL AND APICAL REGIONS.  . Fracture surgery    . Cystoscopy w/ stone manipulation  X 1  . Basal cell carcinoma excision      "probably 1/2 dozen cut off face, left ear" (02/07/2015)  . Cardiac catheterization N/A 02/09/2015    Procedure: Left Heart Cath and Coronary Angiography;  Surgeon: Leonie Man, MD;  Location: [place] [facility];  Service: Cardiovascular;  Laterality: N/A;  . Coronary artery bypass graft N/A 02/17/2015    Procedure: CORONARY ARTERY BYPASS GRAFTING (CABG), ON PUMP, TIMES FIVE, USING LEFT INTERNAL MAMMARY ARTERY, RIGHT GREATER SAPHENOUS VEIN HARVESTED ENDOSCOPICALLY;  Surgeon: [name], MD;  Location: [place];  Service: Open Heart Surgery;  Laterality: N/A;  -LIMA to LAD -SVG to DIAGONAL - SEQ SVG to OM1 and PLB -SVG to PDA  . Tee without cardioversion N/A 02/17/2015    Procedure: TRANSESOPHAGEAL ECHOCARDIOGRAM (TEE);  Surgeon: [name], MD;  Location: [place];  Service:  Open Heart Surgery;  Laterality: N/A;    Current Outpatient Prescriptions on File Prior to Visit  Medication Sig Dispense Refill  . aspirin EC 81 MG tablet Take 81 mg by mouth every morning.    Marland Kitchen atorvastatin (LIPITOR) 20 MG tablet TAKE 1 TABLET BY MOUTH DAILY AT 6:00 P.M. ( MUST SCHEDULE AN APPOINTMENT FOR FUTURE REFILLS ) 30 tablet 0  . cloNIDine (CATAPRES) 0.2 MG tablet Take 0.2 mg by mouth 2 (two) times daily.    . ferrous sulfate 325 (65 FE) MG tablet Take 1 tablet (325 mg total) by mouth daily with breakfast.  3  . furosemide (LASIX) 40 MG tablet Take 1 tablet (40 mg total) by mouth daily. 30 tablet   . gabapentin (NEURONTIN) 100 MG capsule Take 100 mg by mouth 3 (three) times daily.     Marland Kitchen LEVEMIR FLEXTOUCH 100 UNIT/ML Pen Inject 27 Units into the skin at bedtime.     . metoprolol tartrate (LOPRESSOR) 25 MG tablet Take 1 tablet (25 mg total) by mouth 2 (two) times daily.    Marland Kitchen omeprazole (PRILOSEC) 20 MG capsule Take 20 mg by mouth daily.     Marland Kitchen  potassium chloride SA (K-DUR,KLOR-CON) 10 MEQ tablet Take 1 tablet (10 mEq total) by mouth daily.    . traMADol (ULTRAM) 50 MG tablet Take 1 tablet (50 mg total) by mouth every 6 (six) hours as needed for moderate pain. 30 tablet 0  Of note he is on Eliquis 10 mg twice a day -- this will be reduced to 5 mg twice a day on July 20  Social; patient lives with his wife. She has Alzheimer's disease there is a caregiver in the home. I think he was independent with ADLs and IDL's. He was giving his own insulin Levemir  History   Social History  . Marital Status: Married    Spouse Name: N/A  . Number of Children: N/A  . Years of Education: N/A   Occupational History  . Not on file.   Social History Main Topics  . Smoking status: Never Smoker   . Smokeless tobacco: Never Used  . Alcohol Use: No  . Drug Use: No  . Sexual Activity: Not Currently   Other Topics Concern  . Not on file    Family History  Problem Relation Age of Onset  . Deep  vein thrombosis Mother   . Hypertension Mother   . Hypertension Sister   . Diabetes Brother   . Hyperlipidemia Brother   . Hypertension Brother   . Heart attack Brother     Review of systems Gen. patient states he feels  Well Skin does not complain of rashes or itching.  Head ears eyes nose mouth and throat does not complain of any difficulty swallowing or visual changes  Respiratory no cough no shortness of breath Cardiac not complaining of chest pain  GI does not complain of any nausea vomiting diarrhea or constipation or abdominal discomfort.  GU does not complain of dysuria.  Muscle skeletal does have a history of lower extremity weakness largely ambulates in wheelchair has complained of foot numbness at times as well. He has gained some strength he does ambulate with a walker  Psych does not complain of anxiety or depression appears to be in good spirits    Physical examination Temperature 98.0 pulse 64 respirations 18 blood pressure 140/65  Gen. patient is not in any distress pleasant and talkative.  His skin is warm and dry he does have a well-healed mid chest scar area--left mid back there is dressing over what I appears to be the previous centesis site this is followed by nursing  Eyes pupils appear reactive light visual acuity appears grossly intact sclera are clear--he has prescription lenses.  Oropharynx is clear mucous membranes moist.   Respiratory clear entry bilaterallywith somewhat shallow air entry  Cardiac;  Regular rate and rhythm without murmur gallop or rub he does have some mild lower extremity edema this is greater on the left leg  Abdomen;Soft nontender positive bowel sounds  slight umbilicus hernia   Extremities slight venous stasis of physiology. suspect he does have superficial thrombophlebitis  . There is no evidence of a DVT He is ambulating in a wheelchair moves all extremities 4 --he is able to stand without assistance and ambulate although  somewhat unsteady especially right lower extremity knee area Circulation; peripheral pulses are reduced in his feet Neurologic; somewhat bradykinetic in his movements  I do not see any lateralizing findings  speech is clear.  Psych he is alert and oriented pleasant and very sociable and talkative I see no abnormalities here  Labs.  03/07/2015.  Sodium 141 potassium 4.5  BUN 32 creatinine 1.7.  WBC 8.3 hemoglobin 10.2 platelets 275  03/04/2015.  Sodium 140 potassium 4.1 BUN 34 creatinine 1.83.  WBC 8.8 hemoglobin 10.1 platelets 320.    Impression/plan #1 pulmonary embolism again this is a new diagnosis he continues on Eliquis --appears stable does not complain of any shortness of breath or chest pain 2 coronary artery disease status post CABG 5. His chest incision is well-healed this appears stable--he continues on aspirin a statin and metoprolol . #3 history of congestive heart failure  At this point appears stable he is on Lasix with potassium supplementation --most recent weight is 206 this actually appears down about 5 pounds since his preadmission weight. #4 type 2 diabetes with probable neuropathy macrovascular disease, and neuropathy he is on Levemir 27nits at bedtime . CBGs appear to be fairly well controlled as noted above  #5 diabetic neuropathic pain on Neurontin. #6hyperlipidemia on Lipitor or this could be secondary prophylaxis # hypertension- This appears to be relatively stable most recently 140/65 this will warrant follow up by primary care provider.   #8-chronic any disease this appears relatively baseline with creatinine of 1.7 BUN of 32 on lab done 03/07/2015  Ambulatory issues-she still has significant weakness but has gained some strength he will need continued PT note he at home as well as RN support for his multiple medical issues especially cardiac issues with recent pulmonary embolism and recent MI.  He does have a rolling walker and wheelchair at home  he also has a caregiver at home that actually for his wife he also says he has a very attentive neighbor .  #10-history of anemia suspect chronic disease with renal insufficiency-this appears relatively stable with a hemoglobin of 10.2 on lab done on July 16 as well.   [id number] note greater than 30 minutes spent on this discharge summary including assessing patient and-i coordinating plan of care for numerous diagnoses-of note greater than 50% of time spent coordinating plan of clear

## 2015-03-13 DIAGNOSIS — E785 Hyperlipidemia, unspecified: Secondary | ICD-10-CM | POA: Diagnosis not present

## 2015-03-13 DIAGNOSIS — I82409 Acute embolism and thrombosis of unspecified deep veins of unspecified lower extremity: Secondary | ICD-10-CM | POA: Diagnosis not present

## 2015-03-13 DIAGNOSIS — I129 Hypertensive chronic kidney disease with stage 1 through stage 4 chronic kidney disease, or unspecified chronic kidney disease: Secondary | ICD-10-CM | POA: Diagnosis not present

## 2015-03-13 DIAGNOSIS — E1129 Type 2 diabetes mellitus with other diabetic kidney complication: Secondary | ICD-10-CM | POA: Diagnosis not present

## 2015-03-13 DIAGNOSIS — F0390 Unspecified dementia without behavioral disturbance: Secondary | ICD-10-CM | POA: Diagnosis not present

## 2015-03-13 DIAGNOSIS — R079 Chest pain, unspecified: Secondary | ICD-10-CM | POA: Diagnosis not present

## 2015-03-13 DIAGNOSIS — N183 Chronic kidney disease, stage 3 (moderate): Secondary | ICD-10-CM | POA: Diagnosis not present

## 2015-03-13 DIAGNOSIS — G309 Alzheimer's disease, unspecified: Secondary | ICD-10-CM | POA: Diagnosis not present

## 2015-03-16 DIAGNOSIS — G309 Alzheimer's disease, unspecified: Secondary | ICD-10-CM | POA: Diagnosis not present

## 2015-03-16 DIAGNOSIS — F0390 Unspecified dementia without behavioral disturbance: Secondary | ICD-10-CM | POA: Diagnosis not present

## 2015-03-16 DIAGNOSIS — E1129 Type 2 diabetes mellitus with other diabetic kidney complication: Secondary | ICD-10-CM | POA: Diagnosis not present

## 2015-03-16 DIAGNOSIS — I82409 Acute embolism and thrombosis of unspecified deep veins of unspecified lower extremity: Secondary | ICD-10-CM | POA: Diagnosis not present

## 2015-03-16 DIAGNOSIS — I129 Hypertensive chronic kidney disease with stage 1 through stage 4 chronic kidney disease, or unspecified chronic kidney disease: Secondary | ICD-10-CM | POA: Diagnosis not present

## 2015-03-16 DIAGNOSIS — N183 Chronic kidney disease, stage 3 (moderate): Secondary | ICD-10-CM | POA: Diagnosis not present

## 2015-03-16 DIAGNOSIS — E785 Hyperlipidemia, unspecified: Secondary | ICD-10-CM | POA: Diagnosis not present

## 2015-03-16 DIAGNOSIS — R079 Chest pain, unspecified: Secondary | ICD-10-CM | POA: Diagnosis not present

## 2015-03-17 DIAGNOSIS — F0390 Unspecified dementia without behavioral disturbance: Secondary | ICD-10-CM | POA: Diagnosis not present

## 2015-03-17 DIAGNOSIS — E1129 Type 2 diabetes mellitus with other diabetic kidney complication: Secondary | ICD-10-CM | POA: Diagnosis not present

## 2015-03-17 DIAGNOSIS — N183 Chronic kidney disease, stage 3 (moderate): Secondary | ICD-10-CM | POA: Diagnosis not present

## 2015-03-17 DIAGNOSIS — E785 Hyperlipidemia, unspecified: Secondary | ICD-10-CM | POA: Diagnosis not present

## 2015-03-17 DIAGNOSIS — I82409 Acute embolism and thrombosis of unspecified deep veins of unspecified lower extremity: Secondary | ICD-10-CM | POA: Diagnosis not present

## 2015-03-17 DIAGNOSIS — I129 Hypertensive chronic kidney disease with stage 1 through stage 4 chronic kidney disease, or unspecified chronic kidney disease: Secondary | ICD-10-CM | POA: Diagnosis not present

## 2015-03-17 DIAGNOSIS — G309 Alzheimer's disease, unspecified: Secondary | ICD-10-CM | POA: Diagnosis not present

## 2015-03-17 DIAGNOSIS — R079 Chest pain, unspecified: Secondary | ICD-10-CM | POA: Diagnosis not present

## 2015-03-19 DIAGNOSIS — I129 Hypertensive chronic kidney disease with stage 1 through stage 4 chronic kidney disease, or unspecified chronic kidney disease: Secondary | ICD-10-CM | POA: Diagnosis not present

## 2015-03-19 DIAGNOSIS — F0390 Unspecified dementia without behavioral disturbance: Secondary | ICD-10-CM | POA: Diagnosis not present

## 2015-03-19 DIAGNOSIS — E1129 Type 2 diabetes mellitus with other diabetic kidney complication: Secondary | ICD-10-CM | POA: Diagnosis not present

## 2015-03-19 DIAGNOSIS — G309 Alzheimer's disease, unspecified: Secondary | ICD-10-CM | POA: Diagnosis not present

## 2015-03-19 DIAGNOSIS — E785 Hyperlipidemia, unspecified: Secondary | ICD-10-CM | POA: Diagnosis not present

## 2015-03-19 DIAGNOSIS — I82409 Acute embolism and thrombosis of unspecified deep veins of unspecified lower extremity: Secondary | ICD-10-CM | POA: Diagnosis not present

## 2015-03-19 DIAGNOSIS — R079 Chest pain, unspecified: Secondary | ICD-10-CM | POA: Diagnosis not present

## 2015-03-19 DIAGNOSIS — N183 Chronic kidney disease, stage 3 (moderate): Secondary | ICD-10-CM | POA: Diagnosis not present

## 2015-03-20 DIAGNOSIS — I129 Hypertensive chronic kidney disease with stage 1 through stage 4 chronic kidney disease, or unspecified chronic kidney disease: Secondary | ICD-10-CM | POA: Diagnosis not present

## 2015-03-20 DIAGNOSIS — G309 Alzheimer's disease, unspecified: Secondary | ICD-10-CM | POA: Diagnosis not present

## 2015-03-20 DIAGNOSIS — R079 Chest pain, unspecified: Secondary | ICD-10-CM | POA: Diagnosis not present

## 2015-03-20 DIAGNOSIS — E1129 Type 2 diabetes mellitus with other diabetic kidney complication: Secondary | ICD-10-CM | POA: Diagnosis not present

## 2015-03-20 DIAGNOSIS — I82409 Acute embolism and thrombosis of unspecified deep veins of unspecified lower extremity: Secondary | ICD-10-CM | POA: Diagnosis not present

## 2015-03-20 DIAGNOSIS — N183 Chronic kidney disease, stage 3 (moderate): Secondary | ICD-10-CM | POA: Diagnosis not present

## 2015-03-20 DIAGNOSIS — F0390 Unspecified dementia without behavioral disturbance: Secondary | ICD-10-CM | POA: Diagnosis not present

## 2015-03-20 DIAGNOSIS — G629 Polyneuropathy, unspecified: Secondary | ICD-10-CM | POA: Diagnosis not present

## 2015-03-20 DIAGNOSIS — E785 Hyperlipidemia, unspecified: Secondary | ICD-10-CM | POA: Diagnosis not present

## 2015-03-20 DIAGNOSIS — E1140 Type 2 diabetes mellitus with diabetic neuropathy, unspecified: Secondary | ICD-10-CM | POA: Diagnosis not present

## 2015-03-24 ENCOUNTER — Emergency Department (HOSPITAL_COMMUNITY): Payer: Medicare Other

## 2015-03-24 ENCOUNTER — Emergency Department (HOSPITAL_COMMUNITY)
Admission: EM | Admit: 2015-03-24 | Discharge: 2015-03-24 | Disposition: A | Discharge status: Home / Self Care | Payer: Medicare Other | Attending: Emergency Medicine | Admitting: Emergency Medicine

## 2015-03-24 ENCOUNTER — Encounter (HOSPITAL_COMMUNITY): Payer: Self-pay | Admitting: Cardiology

## 2015-03-24 DIAGNOSIS — G309 Alzheimer's disease, unspecified: Secondary | ICD-10-CM | POA: Diagnosis not present

## 2015-03-24 DIAGNOSIS — I252 Old myocardial infarction: Secondary | ICD-10-CM | POA: Insufficient documentation

## 2015-03-24 DIAGNOSIS — I10 Essential (primary) hypertension: Secondary | ICD-10-CM | POA: Insufficient documentation

## 2015-03-24 DIAGNOSIS — Z8701 Personal history of pneumonia (recurrent): Secondary | ICD-10-CM | POA: Insufficient documentation

## 2015-03-24 DIAGNOSIS — W1839XA Other fall on same level, initial encounter: Secondary | ICD-10-CM | POA: Diagnosis not present

## 2015-03-24 DIAGNOSIS — Z7902 Long term (current) use of antithrombotics/antiplatelets: Secondary | ICD-10-CM | POA: Diagnosis not present

## 2015-03-24 DIAGNOSIS — Z8669 Personal history of other diseases of the nervous system and sense organs: Secondary | ICD-10-CM | POA: Diagnosis not present

## 2015-03-24 DIAGNOSIS — Z9889 Other specified postprocedural states: Secondary | ICD-10-CM | POA: Insufficient documentation

## 2015-03-24 DIAGNOSIS — M25561 Pain in right knee: Secondary | ICD-10-CM | POA: Diagnosis not present

## 2015-03-24 DIAGNOSIS — I129 Hypertensive chronic kidney disease with stage 1 through stage 4 chronic kidney disease, or unspecified chronic kidney disease: Secondary | ICD-10-CM | POA: Diagnosis not present

## 2015-03-24 DIAGNOSIS — R2242 Localized swelling, mass and lump, left lower limb: Secondary | ICD-10-CM | POA: Diagnosis not present

## 2015-03-24 DIAGNOSIS — Z951 Presence of aortocoronary bypass graft: Secondary | ICD-10-CM | POA: Insufficient documentation

## 2015-03-24 DIAGNOSIS — Y9289 Other specified places as the place of occurrence of the external cause: Secondary | ICD-10-CM | POA: Insufficient documentation

## 2015-03-24 DIAGNOSIS — W19XXXA Unspecified fall, initial encounter: Secondary | ICD-10-CM

## 2015-03-24 DIAGNOSIS — E119 Type 2 diabetes mellitus without complications: Secondary | ICD-10-CM | POA: Diagnosis not present

## 2015-03-24 DIAGNOSIS — M7989 Other specified soft tissue disorders: Secondary | ICD-10-CM | POA: Diagnosis not present

## 2015-03-24 DIAGNOSIS — E785 Hyperlipidemia, unspecified: Secondary | ICD-10-CM | POA: Diagnosis not present

## 2015-03-24 DIAGNOSIS — S8001XA Contusion of right knee, initial encounter: Secondary | ICD-10-CM | POA: Diagnosis not present

## 2015-03-24 DIAGNOSIS — Y998 Other external cause status: Secondary | ICD-10-CM | POA: Insufficient documentation

## 2015-03-24 DIAGNOSIS — Z79899 Other long term (current) drug therapy: Secondary | ICD-10-CM | POA: Insufficient documentation

## 2015-03-24 DIAGNOSIS — E875 Hyperkalemia: Secondary | ICD-10-CM | POA: Diagnosis not present

## 2015-03-24 DIAGNOSIS — Z7982 Long term (current) use of aspirin: Secondary | ICD-10-CM | POA: Diagnosis not present

## 2015-03-24 DIAGNOSIS — E1129 Type 2 diabetes mellitus with other diabetic kidney complication: Secondary | ICD-10-CM | POA: Diagnosis not present

## 2015-03-24 DIAGNOSIS — Y9389 Activity, other specified: Secondary | ICD-10-CM | POA: Insufficient documentation

## 2015-03-24 DIAGNOSIS — I82409 Acute embolism and thrombosis of unspecified deep veins of unspecified lower extremity: Secondary | ICD-10-CM | POA: Diagnosis not present

## 2015-03-24 DIAGNOSIS — K219 Gastro-esophageal reflux disease without esophagitis: Secondary | ICD-10-CM | POA: Diagnosis not present

## 2015-03-24 DIAGNOSIS — Z85828 Personal history of other malignant neoplasm of skin: Secondary | ICD-10-CM | POA: Insufficient documentation

## 2015-03-24 DIAGNOSIS — M1990 Unspecified osteoarthritis, unspecified site: Secondary | ICD-10-CM | POA: Diagnosis not present

## 2015-03-24 DIAGNOSIS — S8991XA Unspecified injury of right lower leg, initial encounter: Secondary | ICD-10-CM | POA: Diagnosis not present

## 2015-03-24 DIAGNOSIS — R600 Localized edema: Secondary | ICD-10-CM | POA: Diagnosis not present

## 2015-03-24 DIAGNOSIS — F0390 Unspecified dementia without behavioral disturbance: Secondary | ICD-10-CM | POA: Diagnosis not present

## 2015-03-24 DIAGNOSIS — N183 Chronic kidney disease, stage 3 (moderate): Secondary | ICD-10-CM | POA: Diagnosis not present

## 2015-03-24 DIAGNOSIS — R079 Chest pain, unspecified: Secondary | ICD-10-CM | POA: Diagnosis not present

## 2015-03-24 NOTE — ED Notes (Signed)
Patient with no complaints at this time. Respirations even and unlabored. Skin warm/dry. Discharge instructions reviewed with patient at this time. Patient given opportunity to voice concerns/ask questions. Patient discharged at this time and left Emergency Department with steady gait.  Zachary Mcmahon

## 2015-03-24 NOTE — ED Notes (Signed)
While in xray, patient was incontinent of urine.  Cleaned perineum, placed adult depends and paper scrubs.

## 2015-03-24 NOTE — Discharge Instructions (Signed)

## 2015-03-24 NOTE — ED Notes (Signed)
[address] last Thursday and right knee started hurting after fall.  Therapy came to see him at home this morning and recommended he get his knee x-rayed.  Also therapist thinks he may have a blood clot in his right leg.

## 2015-03-24 NOTE — ED Provider Notes (Signed)
CSN: [id number]     Arrival date & time 03/24/15  1528 History   First MD Initiated Contact with Patient 03/24/15 1530     Chief Complaint  Patient presents with  . Leg Pain     (Consider location/radiation/quality/duration/timing/severity/associated sxs/prior Treatment) HPI Comments: The patient is an 81 year old male, history of coronary disease status post multivessel bypass performed approximately 2 months ago. He was recently transferred from the nursing facility to his home, he has 24-hour 7 days a week in house care and lives with his wife as well. He reports that 5 days ago he fell onto his right leg striking his knee and has had pain with ambulating since that time. He has also noticed increased swelling in the leg since falling. He denies chest pain or shortness of breath since surgery. He has no fevers chills nausea or vomiting and has no rashes or bleeding. He reports that his postop wounds are healing well without drainage redness or pain. Physical therapy came to his house today and was concerned because of increased pain in his leg and knee after his fall with [place] sending him to the hospital for imaging to rule out fracture or blood clot.  Patient is a 81 y.o. male presenting with leg pain. The history is provided by the patient.  Leg Pain   Past Medical History  Diagnosis Date  . Hypertension   . High potassium   . Varicose veins   . Hyperlipidemia   . Basal cell carcinoma of forehead 2016 X 2  . Basal cell carcinoma of left earlobe   . Old myocardial infarct     "sometime in the past; don't know when" (02/07/2015)  . Type II diabetes mellitus   . Pneumonia X 1  . GERD (gastroesophageal reflux disease)   . Arthritis     "legs" (02/07/2015)  . History of gout X 1  . Diabetic peripheral neuropathy     "left foot" (02/07/2015)  . Respiratory failure   . Pain and swelling of left lower leg     chronic   Past Surgical History  Procedure Laterality Date  . Knee  arthroscopy Right ~ 2008  . Orif hip fracture  03/20/2012    Procedure: OPEN REDUCTION INTERNAL FIXATION HIP;  Surgeon: Sanjuana Kava, MD;  Location: [facility];  Service: Orthopedics;  Laterality: Right;  . Lower extremity venous doppler  07/11/2012    No evidence of DVT in the left lower extremity. Evidence of partially recanalized, chronic, non-obstructive thrombus in the left great SV and its branches consistent with significant reflux consistent with post-phlebitic syndrome. Significant reflux of the left short saphenous vein.  . Cardiovascular stress test  05/29/2012    Mild-moderate perfusion defect due to infarct/scar with mild-moderate perinfarct ischemia seen in the mid anterior, apical anterior, apical septal, and apical regions. No ECG changes. Global LV systolic function is severely reduced.  . Transthoracic echocardiogram  04/18/2012    EF 45%, mild-moderate LVH  . Transthoracic echocardiogram  04/18/12    EF% 45%.SEVERE HYPOKINESIS TO AKINESIS OF THE MID-DISTAL INFEROLATERAL MYOCARDIUM AND MUCH OF THE APEX.  Marland Kitchen Lexiscam myocardial perfusion  05/29/12    MARKED PERFUSION DEFECT DUE TO INFARC/SCAR WITH MILD PERINFARCT ISCHEMIA IN THE BASAL INFERIOR, MID INFEROSEPTAL, MID INFERIOR AND APICALINFERIOR REGION. EF%33%. PERIINFARCT ISCHEMIA IN THE MID ANTERIOR, APICAL ANTERIOR, APICAL SEPTAL AND APICAL REGIONS.  . Fracture surgery    . Cystoscopy w/ stone manipulation  X 1  . Basal cell carcinoma excision      "  probably 1/2 dozen cut off face, left ear" (02/07/2015)  . Cardiac catheterization N/A 02/09/2015    Procedure: Left Heart Cath and Coronary Angiography;  Surgeon: Leonie Man, MD;  Location: [facility];  Service: Cardiovascular;  Laterality: N/A;  . Coronary artery bypass graft N/A 02/17/2015    Procedure: CORONARY ARTERY BYPASS GRAFTING (CABG), ON PUMP, TIMES FIVE, USING LEFT INTERNAL MAMMARY ARTERY, RIGHT GREATER SAPHENOUS VEIN HARVESTED ENDOSCOPICALLY;  Surgeon: [name], MD;  Location: [facility];  Service: Open Heart Surgery;  Laterality: N/A;  -LIMA to LAD -SVG to DIAGONAL - SEQ SVG to OM1 and PLB -SVG to PDA  . Tee without cardioversion N/A 02/17/2015    Procedure: TRANSESOPHAGEAL ECHOCARDIOGRAM (TEE);  Surgeon: [name], MD;  Location: [facility];  Service: Open Heart Surgery;  Laterality: N/A;  . Open heart sx  02/17/15   Family History  Problem Relation Age of Onset  . Deep vein thrombosis Mother   . Hypertension Mother   . Hypertension Sister   . Diabetes Brother   . Hyperlipidemia Brother   . Hypertension Brother   . Heart attack Brother    History  Substance Use Topics  . Smoking status: Never Smoker   . Smokeless tobacco: Never Used  . Alcohol Use: No    Review of Systems  All other systems reviewed and are negative.     Allergies  Bactrim  Home Medications   Prior to Admission medications   Medication Sig Start Date End Date Taking? Authorizing Provider  apixaban (ELIQUIS) 5 MG TABS tablet Take 1 tablet (5 mg total) by mouth 2 (two) times daily. Starting 03/11/15 03/11/15  Yes Radene Gunning, NP  aspirin EC 81 MG tablet Take 81 mg by mouth every morning.   Yes Historical Provider, MD  atorvastatin (LIPITOR) 20 MG tablet TAKE 1 TABLET BY MOUTH DAILY AT 6:00 P.M. ( MUST SCHEDULE AN APPOINTMENT FOR FUTURE REFILLS ) 03/27/14  Yes Lorretta Harp, MD  cloNIDine (CATAPRES) 0.2 MG tablet Take 0.2 mg by mouth 2 (two) times daily. 01/20/15  Yes Historical Provider, MD  ferrous sulfate 325 (65 FE) MG tablet Take 1 tablet (325 mg total) by mouth daily with breakfast. 02/24/15  Yes Donielle Liston Alba, PA-C  furosemide (LASIX) 40 MG tablet Take 1 tablet (40 mg total) by mouth daily. 02/24/15  Yes Donielle Liston Alba, PA-C  gabapentin (NEURONTIN) 100 MG capsule Take 100 mg by mouth 3 (three) times daily.  04/01/14  Yes Historical Provider, MD  LEVEMIR FLEXTOUCH 100 UNIT/ML Pen Inject 27 Units into the skin at bedtime. 03/05/15  Yes Lezlie Octave  Black, NP  LORazepam (ATIVAN) 1 MG tablet Take 1 tablet (1 mg total) by mouth at bedtime as needed for sleep. 03/05/15  Yes Lauree Chandler, NP  metoprolol tartrate (LOPRESSOR) 25 MG tablet Take 1 tablet (25 mg total) by mouth 2 (two) times daily. 02/24/15  Yes Donielle Liston Alba, PA-C  omeprazole (PRILOSEC) 20 MG capsule Take 20 mg by mouth daily.  04/02/14  Yes Historical Provider, MD  potassium chloride SA (K-DUR,KLOR-CON) 10 MEQ tablet Take 1 tablet (10 mEq total) by mouth daily. 02/24/15  Yes Donielle Liston Alba, PA-C  traMADol (ULTRAM) 50 MG tablet Take 1 tablet (50 mg total) by mouth every 6 (six) hours as needed for moderate pain. 03/05/15  Yes Lauree Chandler, NP  apixaban (ELIQUIS) 5 MG TABS tablet Take 2 tablets (10 mg total) by mouth 2 (two) times daily.  For 7 days Patient not taking: Reported on 03/24/2015 03/05/15   Lezlie Octave Black, NP   BP 153/62 mmHg  Pulse 60  Temp(Src) 98.2 F (36.8 C) (Oral)  Resp 18  Wt 200 lb (90.719 kg)  SpO2 99% Physical Exam  Constitutional: He appears well-developed and well-nourished. No distress.  HENT:  Head: Normocephalic and atraumatic.  Mouth/Throat: Oropharynx is clear and moist. No oropharyngeal exudate.  Eyes: Conjunctivae and EOM are normal. Pupils are equal, round, and reactive to light. Right eye exhibits no discharge. Left eye exhibits no discharge. No scleral icterus.  Neck: Normal range of motion. Neck supple. No JVD present. No thyromegaly present.  Cardiovascular: Normal rate, regular rhythm, normal heart sounds and intact distal pulses.  Exam reveals no gallop and no friction rub.   No murmur heard. Pulmonary/Chest: Effort normal and breath sounds normal. No respiratory distress. He has no wheezes. He has no rales.  Incision of the chest is clean dry and intact, no redness swelling or tenderness  Abdominal: Soft. Bowel sounds are normal. He exhibits no distension and no mass. There is no tenderness.  Musculoskeletal: Normal range of  motion. He exhibits edema ( Symmetrical bilateral lower extremity edema.) and tenderness ( Tenderness with range of motion of the right knee, no crepitance, no deformity. No obvious effusion).  Lymphadenopathy:    He has no cervical adenopathy.  Neurological: He is alert. Coordination normal.  Skin: Skin is warm and dry. No rash noted. No erythema.  Psychiatric: He has a normal mood and affect. His behavior is normal.  Nursing note and vitals reviewed.   ED Course  Procedures (including critical care time) Labs Review Labs Reviewed - No data to display  Imaging Review US Venous Img Lower Unilateral Right  03/24/2015   CLINICAL DATA:  Acute onset of right knee and leg swelling, status post fall. Initial encounter.  EXAM: RIGHT LOWER EXTREMITY VENOUS DOPPLER ULTRASOUND  TECHNIQUE: Gray-scale sonography with graded compression, as well as color Doppler and duplex ultrasound were performed to evaluate the lower extremity deep venous systems from the level of the common femoral vein and including the common femoral, femoral, profunda femoral, popliteal and calf veins including the posterior tibial, peroneal and gastrocnemius veins when visible. The superficial great saphenous vein was also interrogated. Spectral Doppler was utilized to evaluate flow at rest and with distal augmentation maneuvers in the common femoral, femoral and popliteal veins.  COMPARISON:  None.  FINDINGS: Contralateral Common Femoral Vein: Respiratory phasicity is normal and symmetric with the symptomatic side. No evidence of thrombus. Normal compressibility.  Common Femoral Vein: No evidence of thrombus. Normal compressibility, respiratory phasicity and response to augmentation.  Saphenofemoral Junction: No evidence of thrombus. Normal compressibility and flow on color Doppler imaging.  Profunda Femoral Vein: No evidence of thrombus. Normal compressibility and flow on color Doppler imaging.  Femoral Vein: No evidence of thrombus.  Normal compressibility, respiratory phasicity and response to augmentation.  Popliteal Vein: No evidence of thrombus. Normal compressibility, respiratory phasicity and response to augmentation.  Calf Veins: No evidence of thrombus. Normal compressibility and flow on color Doppler imaging. The peroneal vein is not well characterized.  Superficial Great Saphenous Vein: No evidence of thrombus. Normal compressibility and flow on color Doppler imaging.  Venous Reflux:  None.  Other Findings: A moderate amount of soft tissue edema is noted at the right ankle and calf.  IMPRESSION: 1. No evidence of deep venous thrombosis. 2. Moderate amount of soft tissue edema noted at the  right ankle and calf.   Electronically Signed   By: Garald Balding M.D.   On: 03/24/2015 17:40   Dg Knee Complete 4 Views Right  03/24/2015   CLINICAL DATA:  RIGHT knee pain beginning last Thursday after a fall  EXAM: RIGHT KNEE - COMPLETE 4+ VIEW  COMPARISON:  11/23/2009  FINDINGS: Diffuse osseous demineralization.  Joint space narrowing greatest at medial compartment.  Minimal chondrocalcinosis.  Mild patellofemoral spur formation.  No acute fracture, dislocation or bone destruction.  Diffuse soft tissue swelling.  No definite knee joint effusion.  IMPRESSION: Osseous demineralization with osteoarthritic changes and question CPPD/pseudogout.  Degenerative changes are progressive since 2011.  No definite acute osseous abnormalities.   Electronically Signed   By: Lavonia Dana M.D.   On: 03/24/2015 17:25      MDM   Final diagnoses:  Fall  Contusion of right knee, initial encounter  Swelling of lower extremity    The patient has normal vital signs, his exam is somewhat remarkable for increased swelling though with his recent vein harvesting of the right leg I would not be surprised to see increased swelling, he does have a fall in the last 5 days, we'll further evaluate with x-ray and ultrasound. The patient declines pain  medicines  Informed the patient and family members of x-ray results, ultrasound results as well as indications for return. Understanding expressed. Recommended compression stockings, knee immobilizer to help with ambulation to prevent falls. Patient is in agreement. Will follow-up with family doctor.  Noemi Chapel, MD 03/24/15 [phone]

## 2015-03-26 DIAGNOSIS — N183 Chronic kidney disease, stage 3 (moderate): Secondary | ICD-10-CM | POA: Diagnosis not present

## 2015-03-26 DIAGNOSIS — I129 Hypertensive chronic kidney disease with stage 1 through stage 4 chronic kidney disease, or unspecified chronic kidney disease: Secondary | ICD-10-CM | POA: Diagnosis not present

## 2015-03-26 DIAGNOSIS — E785 Hyperlipidemia, unspecified: Secondary | ICD-10-CM | POA: Diagnosis not present

## 2015-03-26 DIAGNOSIS — F0390 Unspecified dementia without behavioral disturbance: Secondary | ICD-10-CM | POA: Diagnosis not present

## 2015-03-26 DIAGNOSIS — R079 Chest pain, unspecified: Secondary | ICD-10-CM | POA: Diagnosis not present

## 2015-03-26 DIAGNOSIS — E1129 Type 2 diabetes mellitus with other diabetic kidney complication: Secondary | ICD-10-CM | POA: Diagnosis not present

## 2015-03-26 DIAGNOSIS — G309 Alzheimer's disease, unspecified: Secondary | ICD-10-CM | POA: Diagnosis not present

## 2015-03-26 DIAGNOSIS — I82409 Acute embolism and thrombosis of unspecified deep veins of unspecified lower extremity: Secondary | ICD-10-CM | POA: Diagnosis not present

## 2015-03-27 ENCOUNTER — Ambulatory Visit: Payer: Medicare Other | Admitting: Cardiovascular Disease

## 2015-03-27 ENCOUNTER — Other Ambulatory Visit: Payer: Self-pay | Admitting: Cardiothoracic Surgery

## 2015-03-27 DIAGNOSIS — Z951 Presence of aortocoronary bypass graft: Secondary | ICD-10-CM

## 2015-03-28 DIAGNOSIS — Z7982 Long term (current) use of aspirin: Secondary | ICD-10-CM | POA: Diagnosis not present

## 2015-03-28 DIAGNOSIS — Z79899 Other long term (current) drug therapy: Secondary | ICD-10-CM | POA: Diagnosis not present

## 2015-03-28 DIAGNOSIS — Z794 Long term (current) use of insulin: Secondary | ICD-10-CM | POA: Diagnosis not present

## 2015-03-28 DIAGNOSIS — M542 Cervicalgia: Secondary | ICD-10-CM | POA: Diagnosis not present

## 2015-03-28 DIAGNOSIS — S161XXA Strain of muscle, fascia and tendon at neck level, initial encounter: Secondary | ICD-10-CM | POA: Diagnosis not present

## 2015-03-30 ENCOUNTER — Ambulatory Visit: Payer: Medicare Other

## 2015-03-31 ENCOUNTER — Other Ambulatory Visit: Payer: Self-pay | Admitting: Internal Medicine

## 2015-04-02 ENCOUNTER — Ambulatory Visit: Payer: Medicare Other | Admitting: Cardiothoracic Surgery

## 2015-04-02 ENCOUNTER — Encounter: Payer: Self-pay | Admitting: Cardiothoracic Surgery

## 2015-04-02 ENCOUNTER — Ambulatory Visit (INDEPENDENT_AMBULATORY_CARE_PROVIDER_SITE_OTHER): Payer: Self-pay | Admitting: Cardiothoracic Surgery

## 2015-04-02 ENCOUNTER — Ambulatory Visit
Admission: RE | Admit: 2015-04-02 | Discharge: 2015-04-02 | Disposition: A | Discharge status: Home / Self Care | Payer: Medicare Other | Source: Ambulatory Visit | Attending: Cardiothoracic Surgery | Admitting: Cardiothoracic Surgery

## 2015-04-02 VITALS — BP 146/68 | HR 69 | Resp 20 | Ht 69.5 in | Wt 200.0 lb

## 2015-04-02 DIAGNOSIS — R600 Localized edema: Secondary | ICD-10-CM | POA: Diagnosis not present

## 2015-04-02 DIAGNOSIS — Z951 Presence of aortocoronary bypass graft: Secondary | ICD-10-CM

## 2015-04-02 DIAGNOSIS — I2510 Atherosclerotic heart disease of native coronary artery without angina pectoris: Secondary | ICD-10-CM

## 2015-04-02 DIAGNOSIS — M1990 Unspecified osteoarthritis, unspecified site: Secondary | ICD-10-CM | POA: Diagnosis not present

## 2015-04-02 DIAGNOSIS — E1140 Type 2 diabetes mellitus with diabetic neuropathy, unspecified: Secondary | ICD-10-CM | POA: Diagnosis not present

## 2015-04-02 DIAGNOSIS — R2689 Other abnormalities of gait and mobility: Secondary | ICD-10-CM | POA: Diagnosis not present

## 2015-04-02 DIAGNOSIS — I214 Non-ST elevation (NSTEMI) myocardial infarction: Secondary | ICD-10-CM

## 2015-04-02 NOTE — Progress Notes (Signed)
Zachary Mcmahon       Zachary Mcmahon,Zachary Mcmahon             [phone]      Zachary Mcmahon Medical Record [id number] Date of Birth: 05/07/1932  Referring: Zachary Harp, MD Primary Care: Zachary Cahill, MD  Chief Complaint:   POST OP FOLLOW UP 02/17/2015 OPERATIVE REPORT PREOPERATIVE DIAGNOSIS: Severe coronary artery disease with recent myocardial infarction. POSTOPERATIVE DIAGNOSIS: Severe coronary artery disease with recent myocardial infarction. SURGICAL PROCEDURE: Coronary artery bypass grafting x5 with left internal mammary to the left anterior descending coronary artery, reverse saphenous vein graft to the diagonal coronary artery, sequential reverse saphenous vein graft to the obtuse marginal and to the posterolateral branch of the right coronary artery, reverse saphenous vein graft to the posterior descending coronary artery with right leg endo-vein harvesting thigh and calf greater saphenous vein, right. SURGEON: Zachary Bal, MD.  History of Present Illness:    Patient returns office in follow-up after recent coronary artery bypass. Patient had a protracted preoperative course with respiratory failure renal failure requiring intubation. Ultimately he was stabilized to a sufficient degree to proceed with coronary artery bypass grafting and postoperatively did well. [facility] After discharge he developed shortness of breath and required readmission for a pulmonary embolus was started on an Elliquis.  Current dose 5 mg bid  (age 81 and cr 1.7) and had a thoracentesis for a pleural effusion. Since the patient is continued to do well and is now at home with home physical therapy.   Past Medical History  Diagnosis Date  . Hypertension   . High potassium   . Varicose veins   . Hyperlipidemia   . Basal cell carcinoma of forehead 2016 X 2  . Basal cell carcinoma of left earlobe   . Old myocardial infarct    "sometime in the past; don't know when" (02/07/2015)  . Type II diabetes mellitus   . Pneumonia X 1  . GERD (gastroesophageal reflux disease)   . Arthritis     "legs" (02/07/2015)  . History of gout X 1  . Diabetic peripheral neuropathy     "left foot" (02/07/2015)  . Respiratory failure   . Pain and swelling of left lower leg     chronic     History  Smoking status  . Never Smoker   Smokeless tobacco  . Never Used    History  Alcohol Use No     Allergies  Allergen Reactions  . Bactrim [Sulfamethoxazole-Trimethoprim] Rash    Current Outpatient Prescriptions  Medication Sig Dispense Refill  . apixaban (ELIQUIS) 5 MG TABS tablet Take 1 tablet (5 mg total) by mouth 2 (two) times daily. Starting 03/11/15    . aspirin EC 81 MG tablet Take 81 mg by mouth every morning.    Marland Kitchen atorvastatin (LIPITOR) 20 MG tablet TAKE 1 TABLET BY MOUTH DAILY AT 6:00 P.M. ( MUST SCHEDULE AN APPOINTMENT FOR FUTURE REFILLS ) 30 tablet 0  . cloNIDine (CATAPRES) 0.2 MG tablet Take 0.2 mg by mouth 2 (two) times daily.    . ferrous sulfate 325 (65 FE) MG tablet Take 1 tablet (325 mg total) by mouth daily with breakfast.  3  . furosemide (LASIX) 40 MG tablet Take 1 tablet (40 mg total) by mouth daily. 30 tablet   . gabapentin (NEURONTIN) 100 MG capsule Take 100 mg by mouth 3 (three) times daily.     Marland Kitchen  LEVEMIR FLEXTOUCH 100 UNIT/ML Pen Inject 27 Units into the skin at bedtime.    Marland Kitchen LORazepam (ATIVAN) 1 MG tablet Take 1 tablet (1 mg total) by mouth at bedtime as needed for sleep. 30 tablet 5  . metoprolol tartrate (LOPRESSOR) 25 MG tablet Take 1 tablet (25 mg total) by mouth 2 (two) times daily.    Marland Kitchen omeprazole (PRILOSEC) 20 MG capsule Take 20 mg by mouth daily.     . potassium chloride SA (K-DUR,KLOR-CON) 10 MEQ tablet Take 1 tablet (10 mEq total) by mouth daily.    . traMADol (ULTRAM) 50 MG tablet Take 1 tablet (50 mg total) by mouth every 6 (six) hours as needed for moderate pain. 120 tablet 5   No current  facility-administered medications for this visit.       Physical Exam: BP 146/68 mmHg  Pulse 69  Resp 20  Ht 5' 9.5" (1.765 m)  Wt 200 lb (90.719 kg)  BMI 29.12 kg/m2  SpO2 97%  General appearance: alert, cooperative and appears stated age Neurologic: intact Heart: regular rate and rhythm, S1, S2 normal, no murmur, click, rub or gallop Lungs: clear to auscultation bilaterally Abdomen: soft, non-tender; bowel sounds normal; no masses,  no organomegaly Extremities: edema mild bilaterial edema Wound: sternum stable    Diagnostic Studies & Laboratory data:     Recent Radiology Findings:   Dg Chest 2 View  04/02/2015   CLINICAL DATA:  Status post CABG on February 17, 2015, follow-up study, currently no complaints.  EXAM: CHEST  2 VIEW  COMPARISON:  Portable chest x-ray of March 03, 2015  FINDINGS: The lungs are well-expanded and clear. The heart is top-normal in size. The pulmonary vascularity is normal. The mediastinum is normal in width. There are 7 intact sternal wires. There is no pleural effusion or pneumothorax. The bony thorax is unremarkable.  IMPRESSION: There is no active cardiopulmonary disease.   Electronically Signed   By: Zachary  Mcmahon M.D.   On: 04/02/2015 12:33      Recent Lab Findings: Lab Results  Component Value Date   WBC 8.3 03/07/2015   HGB 10.2* 03/07/2015   HCT 33.3* 03/07/2015   PLT 275 03/07/2015   GLUCOSE 91 03/07/2015   CHOL 83 02/07/2015   TRIG 91 02/07/2015   HDL 17* 02/07/2015   LDLCALC 48 02/07/2015   ALT 12* 02/10/2015   AST 16 02/10/2015   NA 141 03/07/2015   K 4.5 03/07/2015   CL 105 03/07/2015   CREATININE 1.70* 03/07/2015   BUN 32* 03/07/2015   CO2 29 03/07/2015   TSH 1.364 02/07/2015   INR 1.23 03/02/2015   HGBA1C 7.5* 02/07/2015      Assessment / Plan:     Doing well post op after complicated CABG with, respiratory and renal failure before surgery.  Post op Pulmonary embolus treated at [facility], on Eliquis 5mg  bid  Will have  cardiology review dosing of eliquis with cr 1.7 and age 81 Followed by Dr Zachary Mcmahon Encouraged to start cardiac rehab when home pt complete  Return to cardiac surgery as needed   Zachary Isaac MD      [address] [place] Mcmahon Office [phone]   Beeper [phone]  04/02/2015 1:14 PM

## 2015-04-10 ENCOUNTER — Encounter: Payer: Self-pay | Admitting: Cardiovascular Disease

## 2015-04-10 ENCOUNTER — Ambulatory Visit (INDEPENDENT_AMBULATORY_CARE_PROVIDER_SITE_OTHER): Payer: Medicare Other | Admitting: Cardiovascular Disease

## 2015-04-10 VITALS — BP 122/80 | HR 72 | Ht 67.0 in | Wt 196.0 lb

## 2015-04-10 DIAGNOSIS — I2699 Other pulmonary embolism without acute cor pulmonale: Secondary | ICD-10-CM

## 2015-04-10 DIAGNOSIS — Z951 Presence of aortocoronary bypass graft: Secondary | ICD-10-CM | POA: Diagnosis not present

## 2015-04-10 DIAGNOSIS — I25812 Atherosclerosis of bypass graft of coronary artery of transplanted heart without angina pectoris: Secondary | ICD-10-CM | POA: Diagnosis not present

## 2015-04-10 DIAGNOSIS — I10 Essential (primary) hypertension: Secondary | ICD-10-CM

## 2015-04-10 DIAGNOSIS — I5032 Chronic diastolic (congestive) heart failure: Secondary | ICD-10-CM

## 2015-04-10 DIAGNOSIS — Z87898 Personal history of other specified conditions: Secondary | ICD-10-CM

## 2015-04-10 DIAGNOSIS — Z9289 Personal history of other medical treatment: Secondary | ICD-10-CM

## 2015-04-10 DIAGNOSIS — N183 Chronic kidney disease, stage 3 (moderate): Secondary | ICD-10-CM

## 2015-04-10 MED ORDER — APIXABAN 2.5 MG PO TABS: 2.5000 mg | ORAL_TABLET | Freq: Two times a day (BID) | ORAL | Status: DC

## 2015-04-10 MED ORDER — APIXABAN 2.5 MG PO TABS
2.5000 mg | ORAL_TABLET | Freq: Two times a day (BID) | ORAL | Status: DC
Start: 2015-04-10 — End: 2016-01-08

## 2015-04-10 NOTE — Patient Instructions (Addendum)
   Decrease Eliquis to 2.5mg  twice a day  - samples provided & new prescription sent to [facility] today.  Continue all other medications.   Your physician wants you to follow up in: 6 months.  You will receive a reminder letter in the mail one-two months in advance.  If you don't receive a letter, please call our office to schedule the follow up appointment

## 2015-04-10 NOTE — Progress Notes (Signed)
Patient ID: Zachary Mcmahon, male   DOB: [birth year], 81 y.o.   MRN: [id number]      SUBJECTIVE: The patient returns for posthospitalization follow-up. This is my first time meeting him. He underwent 5 vessel CABG on 02/17/2015 with a LIMA to the LAD, SVG to diagonal, sequential SVG to obtuse marginal and posterolateral branch of the RCA, and SVG to PDA.  He presented with a non-STEMI and had concomitant CHF, acute on chronic renal insufficiency, possible community-acquired pneumonia, and nonsustained ventricular tachycardia. He was discharged and then return to the hospital with chest pain and was found to have pulmonary embolism in the right upper and lower lobes. He was started on apixaban. He had a moderately large left pleural effusion for which he underwent thoracentesis. Now residing at home after stay at [facility].  Echocardiogram on 03/03/2015 demonstrated normal left ventricular systolic function, LVEF [id number], moderate concentric LVH, normal regional wall motion, moderate left atrial dilatation and mild right atrial dilatation (improved LVEF from echo on 02/11/15 at which time LVEF was 45-50%).  Saw CT surg as outpatient on 8/12  He is now doing well and denies chest pain, palpitations, bleeding problems, and shortness of breath. His primary complaints relate to orthopedic issues involving his right knee. He also has questions about intercourse.  ECG performed in the office today demonstrate normal sinus rhythm with ST segment and T-wave abnormalities diffusely.  He is here with his wife and her caregiver, Ebony Hail.   Review of Systems: As per "subjective", otherwise negative.  Allergies  Allergen Reactions  . Bactrim [Sulfamethoxazole-Trimethoprim] Rash    Current Outpatient Prescriptions  Medication Sig Dispense Refill  . apixaban (ELIQUIS) 5 MG TABS tablet Take 1 tablet (5 mg total) by mouth 2 (two) times daily. Starting 03/11/15    . aspirin EC 81 MG tablet Take 81 mg by  mouth every morning.    Marland Kitchen atorvastatin (LIPITOR) 20 MG tablet TAKE 1 TABLET BY MOUTH DAILY AT 6:00 P.M. ( MUST SCHEDULE AN APPOINTMENT FOR FUTURE REFILLS ) 30 tablet 0  . cloNIDine (CATAPRES) 0.2 MG tablet Take 0.2 mg by mouth 2 (two) times daily.    . ferrous sulfate 325 (65 FE) MG tablet Take 1 tablet (325 mg total) by mouth daily with breakfast.  3  . furosemide (LASIX) 40 MG tablet Take 1 tablet (40 mg total) by mouth daily. 30 tablet   . gabapentin (NEURONTIN) 100 MG capsule Take 100 mg by mouth 3 (three) times daily.     Marland Kitchen LEVEMIR FLEXTOUCH 100 UNIT/ML Pen Inject 27 Units into the skin at bedtime.    Marland Kitchen LORazepam (ATIVAN) 1 MG tablet Take 1 tablet (1 mg total) by mouth at bedtime as needed for sleep. 30 tablet 5  . metoprolol tartrate (LOPRESSOR) 25 MG tablet Take 1 tablet (25 mg total) by mouth 2 (two) times daily.    Marland Kitchen omeprazole (PRILOSEC) 20 MG capsule Take 20 mg by mouth daily.     . potassium chloride SA (K-DUR,KLOR-CON) 10 MEQ tablet Take 1 tablet (10 mEq total) by mouth daily.    . traMADol (ULTRAM) 50 MG tablet Take 1 tablet (50 mg total) by mouth every 6 (six) hours as needed for moderate pain. 120 tablet 5   No current facility-administered medications for this visit.    Past Medical History  Diagnosis Date  . Hypertension   . High potassium   . Varicose veins   . Hyperlipidemia   . Basal cell carcinoma of forehead 2016 X  2  . Basal cell carcinoma of left earlobe   . Old myocardial infarct     "sometime in the past; don't know when" (02/07/2015)  . Type II diabetes mellitus   . Pneumonia X 1  . GERD (gastroesophageal reflux disease)   . Arthritis     "legs" (02/07/2015)  . History of gout X 1  . Diabetic peripheral neuropathy     "left foot" (02/07/2015)  . Respiratory failure   . Pain and swelling of left lower leg     chronic    Past Surgical History  Procedure Laterality Date  . Knee arthroscopy Right ~ 2008  . Orif hip fracture  03/20/2012    Procedure: OPEN  REDUCTION INTERNAL FIXATION HIP;  Surgeon: Sanjuana Kava, MD;  Location: [facility];  Service: Orthopedics;  Laterality: Right;  . Lower extremity venous doppler  07/11/2012    No evidence of DVT in the left lower extremity. Evidence of partially recanalized, chronic, non-obstructive thrombus in the left great SV and its branches consistent with significant reflux consistent with post-phlebitic syndrome. Significant reflux of the left short saphenous vein.  . Cardiovascular stress test  05/29/2012    Mild-moderate perfusion defect due to infarct/scar with mild-moderate perinfarct ischemia seen in the mid anterior, apical anterior, apical septal, and apical regions. No ECG changes. Global LV systolic function is severely reduced.  . Transthoracic echocardiogram  04/18/2012    EF 45%, mild-moderate LVH  . Transthoracic echocardiogram  04/18/12    EF% 45%.SEVERE HYPOKINESIS TO AKINESIS OF THE MID-DISTAL INFEROLATERAL MYOCARDIUM AND MUCH OF THE APEX.  Marland Kitchen Lexiscam myocardial perfusion  05/29/12    MARKED PERFUSION DEFECT DUE TO INFARC/SCAR WITH MILD PERINFARCT ISCHEMIA IN THE BASAL INFERIOR, MID INFEROSEPTAL, MID INFERIOR AND APICALINFERIOR REGION. EF%33%. PERIINFARCT ISCHEMIA IN THE MID ANTERIOR, APICAL ANTERIOR, APICAL SEPTAL AND APICAL REGIONS.  . Fracture surgery    . Cystoscopy w/ stone manipulation  X 1  . Basal cell carcinoma excision      "probably 1/2 dozen cut off face, left ear" (02/07/2015)  . Cardiac catheterization N/A 02/09/2015    Procedure: Left Heart Cath and Coronary Angiography;  Surgeon: Leonie Man, MD;  Location: [place] [facility];  Service: Cardiovascular;  Laterality: N/A;  . Coronary artery bypass graft N/A 02/17/2015    Procedure: CORONARY ARTERY BYPASS GRAFTING (CABG), ON PUMP, TIMES FIVE, USING LEFT INTERNAL MAMMARY ARTERY, RIGHT GREATER SAPHENOUS VEIN HARVESTED ENDOSCOPICALLY;  Surgeon: [name], MD;  Location: [place];  Service: Open Heart Surgery;  Laterality: N/A;  -LIMA  to LAD -SVG to DIAGONAL - SEQ SVG to OM1 and PLB -SVG to PDA  . Tee without cardioversion N/A 02/17/2015    Procedure: TRANSESOPHAGEAL ECHOCARDIOGRAM (TEE);  Surgeon: [name], MD;  Location: [place];  Service: Open Heart Surgery;  Laterality: N/A;  . Open heart sx  02/17/15    Social History   Social History  . Marital Status: Married    Spouse Name: N/A  . Number of Children: N/A  . Years of Education: N/A   Occupational History  . Not on file.   Social History Main Topics  . Smoking status: Never Smoker   . Smokeless tobacco: Never Used  . Alcohol Use: No  . Drug Use: No  . Sexual Activity: Not Currently   Other Topics Concern  . Not on file   Social History Narrative     Filed Vitals:   04/10/15 1124  BP: 122/80  Pulse: 72  Height: 5\' 7"  (1.702 m)  Weight: 196 lb (88.905 kg)  SpO2: 92%    PHYSICAL EXAM General: NAD HEENT: Normal. Neck: No JVD, no thyromegaly. Lungs: Clear to auscultation bilaterally with normal respiratory effort. CV: Nondisplaced PMI.  Regular rate and rhythm, normal S1/S2, no S3/S4, no murmur. Trace pretibial and periankle edema b/l.  No carotid bruit.    Abdomen: Soft, nontender, no distention.  Neurologic: Alert and oriented x 3.  Psych: Normal affect. Skin: Normal. Musculoskeletal: Normal range of motion, no gross deformities. Extremities: No clubbing or cyanosis.   ECG: Most recent ECG reviewed.      ASSESSMENT AND PLAN: 1. CAD with recent 5-vessel CABG and NSTEMI: Symptomatically stable. Continue ASA, beta blocker, and statin therapy. Not on ACEI/ARB due to CKD.  2. Pulmonary embolism: Given age and renal insufficiency, will reduce apixaban to 2.5 mg bid.  3. Essential HTN: Controlled. No changes.  4. Hyperlipidemia: Lipids on 6/18-TC 83, TG 91, HDL 17, LDL 48. Contineu Lipitor 20 mg.  5. Chronic diastolic heart failure: Mild leg edema. On Lasix 40 mg daily. No changes.  Dispo: f/u NP in 6 months.  Time spent:  40 minutes, of which greater than 50% was spent reviewing symptoms, relevant blood tests and studies, and discussing management plan with the patient.   Kate Sable, M.D., F.A.C.C.

## 2015-04-10 NOTE — Addendum Note (Signed)
Addended by: Laurine Blazer on: 04/10/2015 02:56 PM   Modules accepted: Orders

## 2015-04-13 ENCOUNTER — Ambulatory Visit: Payer: Medicare Other

## 2015-04-13 ENCOUNTER — Telehealth: Payer: Self-pay | Admitting: *Deleted

## 2015-04-13 DIAGNOSIS — Z951 Presence of aortocoronary bypass graft: Secondary | ICD-10-CM

## 2015-04-13 NOTE — Telephone Encounter (Signed)
Yes, it is recommended.

## 2015-04-13 NOTE — Telephone Encounter (Signed)
No answer

## 2015-04-13 NOTE — Telephone Encounter (Signed)
Patient question if okay for him to do cardiac rehab.  Does not have therapy coming to home anymore.

## 2015-04-15 NOTE — Telephone Encounter (Signed)
Attempted return call again - busy.

## 2015-04-21 NOTE — Telephone Encounter (Signed)
No answer at home number.   Mobile number - patient notified.  Order will be put in for cardiac rehab & scheduler made aware.

## 2015-04-24 DIAGNOSIS — E1165 Type 2 diabetes mellitus with hyperglycemia: Secondary | ICD-10-CM | POA: Diagnosis not present

## 2015-04-24 DIAGNOSIS — I10 Essential (primary) hypertension: Secondary | ICD-10-CM | POA: Diagnosis not present

## 2015-04-29 DIAGNOSIS — I519 Heart disease, unspecified: Secondary | ICD-10-CM | POA: Diagnosis not present

## 2015-04-29 DIAGNOSIS — N184 Chronic kidney disease, stage 4 (severe): Secondary | ICD-10-CM | POA: Diagnosis not present

## 2015-04-29 DIAGNOSIS — I10 Essential (primary) hypertension: Secondary | ICD-10-CM | POA: Diagnosis not present

## 2015-04-29 DIAGNOSIS — E1122 Type 2 diabetes mellitus with diabetic chronic kidney disease: Secondary | ICD-10-CM | POA: Diagnosis not present

## 2015-04-29 DIAGNOSIS — Z23 Encounter for immunization: Secondary | ICD-10-CM | POA: Diagnosis not present

## 2015-05-03 DIAGNOSIS — R600 Localized edema: Secondary | ICD-10-CM | POA: Diagnosis not present

## 2015-05-03 DIAGNOSIS — M1990 Unspecified osteoarthritis, unspecified site: Secondary | ICD-10-CM | POA: Diagnosis not present

## 2015-05-03 DIAGNOSIS — R2689 Other abnormalities of gait and mobility: Secondary | ICD-10-CM | POA: Diagnosis not present

## 2015-05-03 DIAGNOSIS — E1140 Type 2 diabetes mellitus with diabetic neuropathy, unspecified: Secondary | ICD-10-CM | POA: Diagnosis not present

## 2015-05-05 ENCOUNTER — Other Ambulatory Visit: Payer: Self-pay | Admitting: Dermatology

## 2015-05-05 DIAGNOSIS — L570 Actinic keratosis: Secondary | ICD-10-CM | POA: Diagnosis not present

## 2015-05-05 DIAGNOSIS — C44319 Basal cell carcinoma of skin of other parts of face: Secondary | ICD-10-CM | POA: Diagnosis not present

## 2015-05-05 DIAGNOSIS — D692 Other nonthrombocytopenic purpura: Secondary | ICD-10-CM | POA: Diagnosis not present

## 2015-05-05 DIAGNOSIS — C44310 Basal cell carcinoma of skin of unspecified parts of face: Secondary | ICD-10-CM | POA: Diagnosis not present

## 2015-05-12 ENCOUNTER — Ambulatory Visit (INDEPENDENT_AMBULATORY_CARE_PROVIDER_SITE_OTHER): Payer: Medicare Other | Admitting: *Deleted

## 2015-05-12 DIAGNOSIS — I2782 Chronic pulmonary embolism: Secondary | ICD-10-CM

## 2015-05-12 NOTE — Progress Notes (Signed)
Pt was started on Eliquis 5mg  bid for Pulmonary embolus while in hospital in July 2016.  Dose was decreased to 2.5mg  bid at OV 04/10/15 with Dr Bronson Ing due to age and renal function.  Pt denies problems taking Eliquis.  He has not had any bleeding, excessive bruising or GI upset.   Reviewed patients medication list.  Pt is not currently on any combined P-gp and strong CYP3A4 inhibitors/inducers (ketoconazole, traconazole, ritonavir, carbamazepine, phenytoin, rifampin, St. John's wort).  Reviewed labs from 05/15/15 at [facility]..  SCr 1.45, Weight 195, CrCl 50.83   SrCr has improved from 1.7 to 1.45  Will leave pt on 2.5mg  bid for 3 more months and re-evaluate SrCr at that time..  Dose is appropriate based on 2 out of 3 criteria (age,wt,SrCr).   Hgb and HCT: 10.7/32.5     A full discussion of the nature of anticoagulants has been carried out.  A benefit/risk analysis has been presented to the patient, so that they understand the justification for choosing anticoagulation with Eliquis at this time.  The need for compliance is stressed.  Pt is aware to take the medication twice daily.  Side effects of potential bleeding are discussed, including unusual colored urine or stools, coughing up blood or coffee ground emesis, nose bleeds or serious fall or head trauma.  Discussed signs and symptoms of stroke. The patient should avoid any OTC items containing aspirin or ibuprofen.  Avoid alcohol consumption.   Call if any signs of abnormal bleeding.  Discussed financial obligations and resolved any difficulty in obtaining medication.  Next lab test test in 3 months.   LMOM for daughter Judeen Hammans with results Eliquis 3 month f/u placed in recall

## 2015-05-16 LAB — CBC
HEMATOCRIT: 32.5 % — AB (ref 39.0–52.0)
Hemoglobin: 10.7 g/dL — ABNORMAL LOW (ref 13.0–17.0)
MCH: 28.4 pg (ref 26.0–34.0)
MCHC: 32.9 g/dL (ref 30.0–36.0)
MCV: 86.2 fL (ref 78.0–100.0)
MPV: 12.6 fL — ABNORMAL HIGH (ref 8.6–12.4)
Platelets: 181 10*3/uL (ref 150–400)
RBC: 3.77 MIL/uL — ABNORMAL LOW (ref 4.22–5.81)
RDW: 14.8 % (ref 11.5–15.5)
WBC: 9.2 10*3/uL (ref 4.0–10.5)

## 2015-05-16 LAB — BASIC METABOLIC PANEL
BUN: 41 mg/dL — AB (ref 7–25)
CALCIUM: 9.4 mg/dL (ref 8.6–10.3)
CO2: 27 mmol/L (ref 20–31)
CREATININE: 1.45 mg/dL — AB (ref 0.70–1.11)
Chloride: 103 mmol/L (ref 98–110)
Glucose, Bld: 150 mg/dL — ABNORMAL HIGH (ref 65–99)
POTASSIUM: 4.7 mmol/L (ref 3.5–5.3)
Sodium: 139 mmol/L (ref 135–146)

## 2015-05-22 ENCOUNTER — Encounter: Payer: Self-pay | Admitting: *Deleted

## 2015-06-02 DIAGNOSIS — E1140 Type 2 diabetes mellitus with diabetic neuropathy, unspecified: Secondary | ICD-10-CM | POA: Diagnosis not present

## 2015-06-02 DIAGNOSIS — R2689 Other abnormalities of gait and mobility: Secondary | ICD-10-CM | POA: Diagnosis not present

## 2015-06-02 DIAGNOSIS — R600 Localized edema: Secondary | ICD-10-CM | POA: Diagnosis not present

## 2015-06-02 DIAGNOSIS — M1990 Unspecified osteoarthritis, unspecified site: Secondary | ICD-10-CM | POA: Diagnosis not present

## 2015-06-11 DIAGNOSIS — C44310 Basal cell carcinoma of skin of unspecified parts of face: Secondary | ICD-10-CM | POA: Diagnosis not present

## 2015-06-12 DIAGNOSIS — R1084 Generalized abdominal pain: Secondary | ICD-10-CM | POA: Diagnosis not present

## 2015-06-20 DIAGNOSIS — I10 Essential (primary) hypertension: Secondary | ICD-10-CM | POA: Diagnosis not present

## 2015-06-24 DIAGNOSIS — K4090 Unilateral inguinal hernia, without obstruction or gangrene, not specified as recurrent: Secondary | ICD-10-CM | POA: Diagnosis not present

## 2015-06-24 DIAGNOSIS — K429 Umbilical hernia without obstruction or gangrene: Secondary | ICD-10-CM | POA: Diagnosis not present

## 2015-06-26 DIAGNOSIS — K469 Unspecified abdominal hernia without obstruction or gangrene: Secondary | ICD-10-CM | POA: Diagnosis not present

## 2015-06-26 DIAGNOSIS — I2510 Atherosclerotic heart disease of native coronary artery without angina pectoris: Secondary | ICD-10-CM | POA: Diagnosis not present

## 2015-06-26 DIAGNOSIS — I10 Essential (primary) hypertension: Secondary | ICD-10-CM | POA: Diagnosis not present

## 2015-06-26 DIAGNOSIS — Z79899 Other long term (current) drug therapy: Secondary | ICD-10-CM | POA: Diagnosis not present

## 2015-06-28 DIAGNOSIS — R000 Tachycardia, unspecified: Secondary | ICD-10-CM | POA: Diagnosis not present

## 2015-06-28 DIAGNOSIS — R030 Elevated blood-pressure reading, without diagnosis of hypertension: Secondary | ICD-10-CM | POA: Diagnosis not present

## 2015-06-28 DIAGNOSIS — Z7982 Long term (current) use of aspirin: Secondary | ICD-10-CM | POA: Diagnosis not present

## 2015-06-28 DIAGNOSIS — Z951 Presence of aortocoronary bypass graft: Secondary | ICD-10-CM | POA: Diagnosis not present

## 2015-06-28 DIAGNOSIS — Z794 Long term (current) use of insulin: Secondary | ICD-10-CM | POA: Diagnosis not present

## 2015-06-28 DIAGNOSIS — R112 Nausea with vomiting, unspecified: Secondary | ICD-10-CM | POA: Diagnosis not present

## 2015-06-28 DIAGNOSIS — E7800 Pure hypercholesterolemia, unspecified: Secondary | ICD-10-CM | POA: Diagnosis not present

## 2015-06-28 DIAGNOSIS — Z79899 Other long term (current) drug therapy: Secondary | ICD-10-CM | POA: Diagnosis not present

## 2015-06-28 DIAGNOSIS — I10 Essential (primary) hypertension: Secondary | ICD-10-CM | POA: Diagnosis not present

## 2015-06-28 DIAGNOSIS — I519 Heart disease, unspecified: Secondary | ICD-10-CM | POA: Diagnosis not present

## 2015-06-28 DIAGNOSIS — Z7902 Long term (current) use of antithrombotics/antiplatelets: Secondary | ICD-10-CM | POA: Diagnosis not present

## 2015-06-28 DIAGNOSIS — R0602 Shortness of breath: Secondary | ICD-10-CM | POA: Diagnosis not present

## 2015-06-28 DIAGNOSIS — E119 Type 2 diabetes mellitus without complications: Secondary | ICD-10-CM | POA: Diagnosis not present

## 2015-06-28 DIAGNOSIS — R110 Nausea: Secondary | ICD-10-CM | POA: Diagnosis not present

## 2015-06-29 DIAGNOSIS — H25819 Combined forms of age-related cataract, unspecified eye: Secondary | ICD-10-CM | POA: Diagnosis not present

## 2015-06-29 DIAGNOSIS — E11319 Type 2 diabetes mellitus with unspecified diabetic retinopathy without macular edema: Secondary | ICD-10-CM | POA: Diagnosis not present

## 2015-07-01 DIAGNOSIS — R944 Abnormal results of kidney function studies: Secondary | ICD-10-CM | POA: Diagnosis not present

## 2015-07-01 DIAGNOSIS — R110 Nausea: Secondary | ICD-10-CM | POA: Diagnosis not present

## 2015-07-01 DIAGNOSIS — I10 Essential (primary) hypertension: Secondary | ICD-10-CM | POA: Diagnosis not present

## 2015-07-03 DIAGNOSIS — R2689 Other abnormalities of gait and mobility: Secondary | ICD-10-CM | POA: Diagnosis not present

## 2015-07-03 DIAGNOSIS — E1140 Type 2 diabetes mellitus with diabetic neuropathy, unspecified: Secondary | ICD-10-CM | POA: Diagnosis not present

## 2015-07-03 DIAGNOSIS — R600 Localized edema: Secondary | ICD-10-CM | POA: Diagnosis not present

## 2015-07-03 DIAGNOSIS — M1990 Unspecified osteoarthritis, unspecified site: Secondary | ICD-10-CM | POA: Diagnosis not present

## 2015-07-13 DIAGNOSIS — R110 Nausea: Secondary | ICD-10-CM | POA: Diagnosis not present

## 2015-07-13 DIAGNOSIS — R944 Abnormal results of kidney function studies: Secondary | ICD-10-CM | POA: Diagnosis not present

## 2015-07-13 DIAGNOSIS — I10 Essential (primary) hypertension: Secondary | ICD-10-CM | POA: Diagnosis not present

## 2015-08-02 DIAGNOSIS — R2689 Other abnormalities of gait and mobility: Secondary | ICD-10-CM | POA: Diagnosis not present

## 2015-08-02 DIAGNOSIS — R600 Localized edema: Secondary | ICD-10-CM | POA: Diagnosis not present

## 2015-08-02 DIAGNOSIS — M1990 Unspecified osteoarthritis, unspecified site: Secondary | ICD-10-CM | POA: Diagnosis not present

## 2015-08-02 DIAGNOSIS — E1140 Type 2 diabetes mellitus with diabetic neuropathy, unspecified: Secondary | ICD-10-CM | POA: Diagnosis not present

## 2015-08-10 DIAGNOSIS — H2511 Age-related nuclear cataract, right eye: Secondary | ICD-10-CM | POA: Diagnosis not present

## 2015-08-10 DIAGNOSIS — H35363 Drusen (degenerative) of macula, bilateral: Secondary | ICD-10-CM | POA: Diagnosis not present

## 2015-08-10 DIAGNOSIS — H25812 Combined forms of age-related cataract, left eye: Secondary | ICD-10-CM | POA: Diagnosis not present

## 2015-08-12 DIAGNOSIS — I10 Essential (primary) hypertension: Secondary | ICD-10-CM | POA: Diagnosis not present

## 2015-08-13 ENCOUNTER — Ambulatory Visit (INDEPENDENT_AMBULATORY_CARE_PROVIDER_SITE_OTHER): Payer: Medicare Other | Admitting: *Deleted

## 2015-08-13 DIAGNOSIS — Z5181 Encounter for therapeutic drug level monitoring: Secondary | ICD-10-CM | POA: Diagnosis not present

## 2015-08-13 DIAGNOSIS — I2699 Other pulmonary embolism without acute cor pulmonale: Secondary | ICD-10-CM

## 2015-08-13 DIAGNOSIS — Z79899 Other long term (current) drug therapy: Secondary | ICD-10-CM | POA: Diagnosis not present

## 2015-08-13 NOTE — Progress Notes (Signed)
Pt was started on Eliquis 5mg  bid for Pulmonary embolus while in hospital in July 2016. Dose was decreased to 2.5mg  bid at OV 04/10/15 with Dr Bronson Ing due to age and renal function.  Pt denies problems taking Eliquis. He has not had any bleeding, excessive bruising or GI upset.   Reviewed patients medication list. Pt is not currently on any combined P-gp and strong CYP3A4 inhibitors/inducers (ketoconazole, traconazole, ritonavir, carbamazepine, phenytoin, rifampin, St. John's wort). Reviewed labs from [facility] 08/13/15. SCr 1.72, Weight 213, CrCl 46.03 SCr is elevated at 1.72 but stable. Will leave pt on 2.5mg  bid for 3 more months and re-evaluate SrCr at that time.. Dose is appropriate based on 2 out of 3 criteria (age,wt,SrCr). Hgb and HCT: 11.7/38.0   A full discussion of the nature of anticoagulants has been carried out. A benefit/risk analysis has been presented to the patient, so that they understand the justification for choosing anticoagulation with Eliquis at this time. The need for compliance is stressed. Pt is aware to take the medication twice daily. Side effects of potential bleeding are discussed, including unusual colored urine or stools, coughing up blood or coffee ground emesis, nose bleeds or serious fall or head trauma. Discussed signs and symptoms of stroke. The patient should avoid any OTC items containing aspirin or ibuprofen. Avoid alcohol consumption. Call if any signs of abnormal bleeding. Discussed financial obligations and resolved any difficulty in obtaining medication. Next lab test test in 3 months.   Pt aware of lab results.  Follow up in 3 months.  Placed in recall.

## 2015-08-14 ENCOUNTER — Telehealth: Payer: Self-pay | Admitting: *Deleted

## 2015-08-14 NOTE — Telephone Encounter (Signed)
-----   Message from Massie Maroon, McMullen sent at 08/13/2015  3:22 PM EST -----   ----- Message -----    From: Herminio Commons, MD    Sent: 08/13/2015   3:21 PM      To: Staci T Ashworth, CMA  Stable renal dysfunction. Should f/u with PCP.

## 2015-08-14 NOTE — Telephone Encounter (Signed)
Notes Recorded by Laurine Blazer, LPN on [id number] at 11:59 AM Patient notified. Copy to pmd.

## 2015-08-19 DIAGNOSIS — L719 Rosacea, unspecified: Secondary | ICD-10-CM | POA: Diagnosis not present

## 2015-08-19 DIAGNOSIS — L570 Actinic keratosis: Secondary | ICD-10-CM | POA: Diagnosis not present

## 2015-08-26 NOTE — Patient Instructions (Signed)
Zachary Mcmahon  08/26/2015     @[email or web address] @   Your procedure is scheduled on 08/31/2015.  Report to [facility] at 7:00 A.M.  Call this number if you have problems the morning of surgery:  [phone]   Remember:  Do not eat food or drink liquids after midnight.  Take these medicines the morning of surgery with A SIP OF WATER Catapress, gabapentin, ativan, metoprolol, prilosec, ultram   Do not wear jewelry, make-up or nail polish.  Do not wear lotions, powders, or perfumes.  You may wear deodorant.  Do not shave 48 hours prior to surgery.  Men may shave face and neck.  Do not bring valuables to the hospital.  [facility] is not responsible for any belongings or valuables.  Contacts, dentures or bridgework may not be worn into surgery.  Leave your suitcase in the car.  After surgery it may be brought to your room.  For patients admitted to the hospital, discharge time will be determined by your treatment team.  Patients discharged the day of surgery will not be allowed to drive home.    Please read over the following fact sheets that you were given. Anesthesia Post-op Instructions     PATIENT INSTRUCTIONS POST-ANESTHESIA  IMMEDIATELY FOLLOWING SURGERY:  Do not drive or operate machinery for the first twenty four hours after surgery.  Do not make any important decisions for twenty four hours after surgery or while taking narcotic pain medications or sedatives.  If you develop intractable nausea and vomiting or a severe headache please notify your doctor immediately.  FOLLOW-UP:  Please make an appointment with your surgeon as instructed. You do not need to follow up with anesthesia unless specifically instructed to do so.  WOUND CARE INSTRUCTIONS (if applicable):  Keep a dry clean dressing on the anesthesia/puncture wound site if there is drainage.  Once the wound has quit draining you may leave it open to air.  Generally you should leave the bandage intact for twenty  four hours unless there is drainage.  If the epidural site drains for more than 36-48 hours please call the anesthesia department.  QUESTIONS?:  Please feel free to call your physician or the hospital operator if you have any questions, and they will be happy to assist you.       A cataract is a clouding of the lens of the eye. When a lens becomes cloudy, vision is reduced based on the degree and nature of the clouding. Surgery may be needed to improve vision. Surgery removes the cloudy lens and usually replaces it with a substitute lens (intraocular lens, IOL). LET YOUR EYE DOCTOR KNOW ABOUT:  Allergies to food or medicine.  Medicines taken including herbs, eye drops, over-the-counter medicines, and creams.  Use of steroids (by mouth or creams).  Previous problems with anesthetics or numbing medicine.  History of bleeding problems or blood clots.  Previous surgery.  Other health problems, including diabetes and kidney problems.  Possibility of pregnancy, if this applies. RISKS AND COMPLICATIONS  Infection.  Inflammation of the eyeball (endophthalmitis) that can spread to both eyes (sympathetic ophthalmia).  Poor wound healing.  If an IOL is inserted, it can later fall out of proper position. This is very uncommon.  Clouding of the part of your eye that holds an IOL in place. This is called an "after-cataract." These are uncommon but easily treated. BEFORE THE PROCEDURE  Do not eat or drink anything except small amounts of water for 8 to  12 before your surgery, or as directed by your caregiver.  Unless you are told otherwise, continue any eye drops you have been prescribed.  Talk to your primary caregiver about all other medicines that you take (both prescription and nonprescription). In some cases, you may need to stop or change medicines near the time of your surgery. This is most important if you are taking blood-thinning medicine.Do not stop medicines unless you are told  to do so.  Arrange for someone to drive you to and from the procedure.  Do not put contact lenses in either eye on the day of your surgery. PROCEDURE There is more than one method for safely removing a cataract. Your doctor can explain the differences and help determine which is best for you. Phacoemulsification surgery is the most common form of cataract surgery.  An injection is given behind the eye or eye drops are given to make this a painless procedure.  A small cut (incision) is made on the edge of the clear, dome-shaped surface that covers the front of the eye (cornea).  A tiny probe is painlessly inserted into the eye. This device gives off ultrasound waves that soften and break up the cloudy center of the lens. This makes it easier for the cloudy lens to be removed by suction.  An IOL may be implanted.  The normal lens of the eye is covered by a clear capsule. Part of that capsule is intentionally left in the eye to support the IOL.  Your surgeon may or may not use stitches to close the incision. There are other forms of cataract surgery that require a larger incision and stitches to close the eye. This approach is taken in cases where the doctor feels that the cataract cannot be easily removed using phacoemulsification. AFTER THE PROCEDURE  When an IOL is implanted, it does not need care. It becomes a permanent part of your eye and cannot be seen or felt.  Your doctor will schedule follow-up exams to check on your progress.  Review your other medicines with your doctor to see which can be resumed after surgery.  Use eye drops or take medicine as prescribed by your doctor.   This information is not intended to replace advice given to you by your health care provider. Make sure you discuss any questions you have with your health care provider.   Document Released: 07/28/2011 Document Revised: 08/29/2014 Document Reviewed: 07/28/2011 Elsevier Interactive Patient Education  [employer].

## 2015-08-28 ENCOUNTER — Inpatient Hospital Stay (HOSPITAL_COMMUNITY)
Admission: RE | Admit: 2015-08-28 | Discharge: 2015-08-28 | Disposition: A | Discharge status: Home / Self Care | Payer: Medicare Other | Source: Ambulatory Visit

## 2015-08-31 ENCOUNTER — Ambulatory Visit (HOSPITAL_COMMUNITY): Admission: RE | Admit: 2015-08-31 | Payer: Medicare Other | Source: Ambulatory Visit | Admitting: Ophthalmology

## 2015-08-31 ENCOUNTER — Encounter (HOSPITAL_COMMUNITY): Admission: RE | Payer: Self-pay | Source: Ambulatory Visit

## 2015-08-31 SURGERY — PHACOEMULSIFICATION, CATARACT, WITH IOL INSERTION
Anesthesia: Monitor Anesthesia Care | Site: Eye | Laterality: Left

## 2015-09-02 DIAGNOSIS — R2689 Other abnormalities of gait and mobility: Secondary | ICD-10-CM | POA: Diagnosis not present

## 2015-09-02 DIAGNOSIS — E1140 Type 2 diabetes mellitus with diabetic neuropathy, unspecified: Secondary | ICD-10-CM | POA: Diagnosis not present

## 2015-09-02 DIAGNOSIS — R600 Localized edema: Secondary | ICD-10-CM | POA: Diagnosis not present

## 2015-09-02 DIAGNOSIS — M1990 Unspecified osteoarthritis, unspecified site: Secondary | ICD-10-CM | POA: Diagnosis not present

## 2015-09-28 DIAGNOSIS — R600 Localized edema: Secondary | ICD-10-CM | POA: Diagnosis not present

## 2015-10-03 ENCOUNTER — Inpatient Hospital Stay (HOSPITAL_COMMUNITY)
Admission: EM | Admit: 2015-10-03 | Discharge: 2015-10-06 | DRG: 291 | Disposition: A | Discharge status: Home Health | Payer: Medicare Other | Attending: Internal Medicine | Admitting: Internal Medicine

## 2015-10-03 ENCOUNTER — Encounter (HOSPITAL_COMMUNITY): Payer: Self-pay

## 2015-10-03 ENCOUNTER — Emergency Department (HOSPITAL_COMMUNITY): Payer: Medicare Other

## 2015-10-03 DIAGNOSIS — E1142 Type 2 diabetes mellitus with diabetic polyneuropathy: Secondary | ICD-10-CM | POA: Diagnosis present

## 2015-10-03 DIAGNOSIS — E785 Hyperlipidemia, unspecified: Secondary | ICD-10-CM | POA: Diagnosis present

## 2015-10-03 DIAGNOSIS — N183 Chronic kidney disease, stage 3 unspecified: Secondary | ICD-10-CM | POA: Diagnosis present

## 2015-10-03 DIAGNOSIS — Z8249 Family history of ischemic heart disease and other diseases of the circulatory system: Secondary | ICD-10-CM | POA: Diagnosis not present

## 2015-10-03 DIAGNOSIS — I2510 Atherosclerotic heart disease of native coronary artery without angina pectoris: Secondary | ICD-10-CM | POA: Diagnosis not present

## 2015-10-03 DIAGNOSIS — Z7901 Long term (current) use of anticoagulants: Secondary | ICD-10-CM

## 2015-10-03 DIAGNOSIS — K219 Gastro-esophageal reflux disease without esophagitis: Secondary | ICD-10-CM | POA: Diagnosis not present

## 2015-10-03 DIAGNOSIS — Z86711 Personal history of pulmonary embolism: Secondary | ICD-10-CM

## 2015-10-03 DIAGNOSIS — Z951 Presence of aortocoronary bypass graft: Secondary | ICD-10-CM

## 2015-10-03 DIAGNOSIS — I10 Essential (primary) hypertension: Secondary | ICD-10-CM | POA: Diagnosis not present

## 2015-10-03 DIAGNOSIS — R0682 Tachypnea, not elsewhere classified: Secondary | ICD-10-CM | POA: Diagnosis not present

## 2015-10-03 DIAGNOSIS — I252 Old myocardial infarction: Secondary | ICD-10-CM | POA: Diagnosis not present

## 2015-10-03 DIAGNOSIS — Z833 Family history of diabetes mellitus: Secondary | ICD-10-CM | POA: Diagnosis not present

## 2015-10-03 DIAGNOSIS — R739 Hyperglycemia, unspecified: Secondary | ICD-10-CM | POA: Diagnosis not present

## 2015-10-03 DIAGNOSIS — Z85828 Personal history of other malignant neoplasm of skin: Secondary | ICD-10-CM

## 2015-10-03 DIAGNOSIS — E1122 Type 2 diabetes mellitus with diabetic chronic kidney disease: Secondary | ICD-10-CM | POA: Diagnosis not present

## 2015-10-03 DIAGNOSIS — E11311 Type 2 diabetes mellitus with unspecified diabetic retinopathy with macular edema: Secondary | ICD-10-CM | POA: Diagnosis not present

## 2015-10-03 DIAGNOSIS — R404 Transient alteration of awareness: Secondary | ICD-10-CM | POA: Diagnosis not present

## 2015-10-03 DIAGNOSIS — I110 Hypertensive heart disease with heart failure: Secondary | ICD-10-CM | POA: Diagnosis not present

## 2015-10-03 DIAGNOSIS — I509 Heart failure, unspecified: Secondary | ICD-10-CM | POA: Diagnosis not present

## 2015-10-03 DIAGNOSIS — R0602 Shortness of breath: Secondary | ICD-10-CM | POA: Diagnosis not present

## 2015-10-03 DIAGNOSIS — J811 Chronic pulmonary edema: Secondary | ICD-10-CM | POA: Diagnosis present

## 2015-10-03 DIAGNOSIS — I5043 Acute on chronic combined systolic (congestive) and diastolic (congestive) heart failure: Secondary | ICD-10-CM | POA: Diagnosis present

## 2015-10-03 DIAGNOSIS — I130 Hypertensive heart and chronic kidney disease with heart failure and stage 1 through stage 4 chronic kidney disease, or unspecified chronic kidney disease: Secondary | ICD-10-CM | POA: Diagnosis not present

## 2015-10-03 DIAGNOSIS — T502X5A Adverse effect of carbonic-anhydrase inhibitors, benzothiadiazides and other diuretics, initial encounter: Secondary | ICD-10-CM | POA: Diagnosis present

## 2015-10-03 DIAGNOSIS — I5033 Acute on chronic diastolic (congestive) heart failure: Secondary | ICD-10-CM | POA: Diagnosis not present

## 2015-10-03 DIAGNOSIS — I5031 Acute diastolic (congestive) heart failure: Secondary | ICD-10-CM

## 2015-10-03 DIAGNOSIS — E119 Type 2 diabetes mellitus without complications: Secondary | ICD-10-CM

## 2015-10-03 DIAGNOSIS — R42 Dizziness and giddiness: Secondary | ICD-10-CM | POA: Diagnosis not present

## 2015-10-03 LAB — CBC
HCT: 34.7 % — ABNORMAL LOW (ref 39.0–52.0)
Hemoglobin: 11.1 g/dL — ABNORMAL LOW (ref 13.0–17.0)
MCH: 29.4 pg (ref 26.0–34.0)
MCHC: 32 g/dL (ref 30.0–36.0)
MCV: 91.8 fL (ref 78.0–100.0)
Platelets: 140 10*3/uL — ABNORMAL LOW (ref 150–400)
RBC: 3.78 MIL/uL — ABNORMAL LOW (ref 4.22–5.81)
RDW: 14.6 % (ref 11.5–15.5)
WBC: 8.5 10*3/uL (ref 4.0–10.5)

## 2015-10-03 LAB — BASIC METABOLIC PANEL
Anion gap: 6 (ref 5–15)
BUN: 31 mg/dL — ABNORMAL HIGH (ref 6–20)
CALCIUM: 9.3 mg/dL (ref 8.9–10.3)
CO2: 27 mmol/L (ref 22–32)
CREATININE: 1.54 mg/dL — AB (ref 0.61–1.24)
Chloride: 106 mmol/L (ref 101–111)
GFR calc non Af Amer: 41 mL/min — ABNORMAL LOW (ref >60)
GFR, EST AFRICAN AMERICAN: 47 mL/min — AB (ref >60)
Glucose, Bld: 230 mg/dL — ABNORMAL HIGH (ref 65–99)
Potassium: 4.8 mmol/L (ref 3.5–5.1)
SODIUM: 139 mmol/L (ref 135–145)

## 2015-10-03 LAB — PROTIME-INR
INR: 1.33 (ref 0.00–1.49)
PROTHROMBIN TIME: 16.6 s — AB (ref 11.6–15.2)

## 2015-10-03 LAB — MAGNESIUM: MAGNESIUM: 1.9 mg/dL (ref 1.7–2.4)

## 2015-10-03 LAB — BRAIN NATRIURETIC PEPTIDE: B NATRIURETIC PEPTIDE 5: 1531 pg/mL — AB (ref 0.0–100.0)

## 2015-10-03 LAB — I-STAT TROPONIN, ED: TROPONIN I, POC: 0.02 ng/mL (ref 0.00–0.08)

## 2015-10-03 MED ORDER — LORAZEPAM 1 MG PO TABS: 1.0000 mg | ORAL_TABLET | Freq: Every evening | ORAL | Status: DC | PRN

## 2015-10-03 MED ORDER — FUROSEMIDE 10 MG/ML IJ SOLN
60.0000 mg | Freq: Two times a day (BID) | INTRAMUSCULAR | Status: DC
  Administered 2015-10-04 – 2015-10-06 (×5): 60 mg via INTRAVENOUS
  Filled 2015-10-03 (×5): qty 6

## 2015-10-03 MED ORDER — GABAPENTIN 100 MG PO CAPS
100.0000 mg | ORAL_CAPSULE | Freq: Three times a day (TID) | ORAL | Status: DC
  Administered 2015-10-03 – 2015-10-06 (×9): 100 mg via ORAL
  Filled 2015-10-03 (×9): qty 1

## 2015-10-03 MED ORDER — POTASSIUM CHLORIDE CRYS ER 20 MEQ PO TBCR
20.0000 meq | EXTENDED_RELEASE_TABLET | Freq: Once | ORAL | Status: AC
  Administered 2015-10-03: 20 meq via ORAL
  Filled 2015-10-03: qty 1

## 2015-10-03 MED ORDER — SODIUM CHLORIDE 0.9% FLUSH: 3.0000 mL | INTRAVENOUS | Status: DC | PRN

## 2015-10-03 MED ORDER — FUROSEMIDE 10 MG/ML IJ SOLN
60.0000 mg | Freq: Once | INTRAMUSCULAR | Status: AC
  Administered 2015-10-03: 60 mg via INTRAVENOUS
  Filled 2015-10-03: qty 6

## 2015-10-03 MED ORDER — ONDANSETRON HCL 4 MG/2ML IJ SOLN
4.0000 mg | Freq: Four times a day (QID) | INTRAMUSCULAR | Status: DC | PRN
Start: 2015-10-03 — End: 2015-10-06

## 2015-10-03 MED ORDER — CLONIDINE HCL 0.2 MG PO TABS
0.2000 mg | ORAL_TABLET | Freq: Two times a day (BID) | ORAL | Status: DC
  Administered 2015-10-03 – 2015-10-06 (×6): 0.2 mg via ORAL
  Filled 2015-10-03 (×6): qty 1

## 2015-10-03 MED ORDER — ASPIRIN EC 81 MG PO TBEC
81.0000 mg | DELAYED_RELEASE_TABLET | Freq: Every morning | ORAL | Status: DC
  Administered 2015-10-04 – 2015-10-06 (×3): 81 mg via ORAL
  Filled 2015-10-03 (×3): qty 1

## 2015-10-03 MED ORDER — LOSARTAN POTASSIUM 50 MG PO TABS
100.0000 mg | ORAL_TABLET | Freq: Every day | ORAL | Status: DC
  Administered 2015-10-04 – 2015-10-06 (×3): 100 mg via ORAL
  Filled 2015-10-03 (×3): qty 2

## 2015-10-03 MED ORDER — HYDRALAZINE HCL 10 MG PO TABS
10.0000 mg | ORAL_TABLET | Freq: Three times a day (TID) | ORAL | Status: DC
  Administered 2015-10-03 – 2015-10-06 (×8): 10 mg via ORAL
  Filled 2015-10-03 (×9): qty 1

## 2015-10-03 MED ORDER — ATORVASTATIN CALCIUM 20 MG PO TABS
20.0000 mg | ORAL_TABLET | Freq: Every day | ORAL | Status: DC
  Administered 2015-10-04 – 2015-10-05 (×2): 20 mg via ORAL
  Filled 2015-10-03 (×2): qty 1

## 2015-10-03 MED ORDER — SODIUM CHLORIDE 0.9% FLUSH
3.0000 mL | Freq: Two times a day (BID) | INTRAVENOUS | Status: DC
  Administered 2015-10-04 – 2015-10-06 (×5): 3 mL via INTRAVENOUS

## 2015-10-03 MED ORDER — INSULIN DETEMIR 100 UNIT/ML ~~LOC~~ SOLN
27.0000 [IU] | Freq: Every day | SUBCUTANEOUS | Status: DC
  Administered 2015-10-03: 27 [IU] via SUBCUTANEOUS
  Filled 2015-10-03 (×2): qty 0.27

## 2015-10-03 MED ORDER — APIXABAN 5 MG PO TABS
2.5000 mg | ORAL_TABLET | Freq: Two times a day (BID) | ORAL | Status: DC
  Administered 2015-10-03 – 2015-10-06 (×6): 2.5 mg via ORAL
  Filled 2015-10-03 (×6): qty 1

## 2015-10-03 MED ORDER — INSULIN ASPART 100 UNIT/ML ~~LOC~~ SOLN: 0.0000 [IU] | Freq: Every day | SUBCUTANEOUS | Status: DC

## 2015-10-03 MED ORDER — SODIUM CHLORIDE 0.9 % IV SOLN: 250.0000 mL | INTRAVENOUS | Status: DC | PRN

## 2015-10-03 MED ORDER — INSULIN ASPART 100 UNIT/ML ~~LOC~~ SOLN
0.0000 [IU] | Freq: Three times a day (TID) | SUBCUTANEOUS | Status: DC
  Administered 2015-10-04 – 2015-10-05 (×3): 2 [IU] via SUBCUTANEOUS
  Administered 2015-10-05 – 2015-10-06 (×3): 3 [IU] via SUBCUTANEOUS

## 2015-10-03 MED ORDER — ACETAMINOPHEN 325 MG PO TABS: 650.0000 mg | ORAL_TABLET | ORAL | Status: DC | PRN

## 2015-10-03 MED ORDER — NITROGLYCERIN 2 % TD OINT
0.5000 [in_us] | TOPICAL_OINTMENT | Freq: Four times a day (QID) | TRANSDERMAL | Status: DC | PRN
  Administered 2015-10-03: 0.5 [in_us] via TOPICAL
  Filled 2015-10-03 (×2): qty 1

## 2015-10-03 MED ORDER — TRAMADOL HCL 50 MG PO TABS
50.0000 mg | ORAL_TABLET | Freq: Four times a day (QID) | ORAL | Status: DC | PRN
  Administered 2015-10-06: 50 mg via ORAL
  Filled 2015-10-03: qty 1

## 2015-10-03 NOTE — ED Provider Notes (Signed)
CSN: [id number]     Arrival date & time 10/03/15  1738 History   First MD Initiated Contact with Patient 10/03/15 1742     Chief Complaint  Patient presents with  . Hypertension    HPI Patient presents to the emergency room with complaints of shortness of breath and hypertension. Patient states when he woke up this morning he was feeling a little bit short of breath. He noticed he felt a little bit better when he sat up and it returned when he went to lie back down again. The patient has had some trouble with recent leg swelling. He saw his primary doctor a few days ago and had his Lasix increased. He feels his legs are actually getting better. This evening he decided check his blood pressure and it was very elevated with systolic in the [id number]. Patient came in to the emergency room for further evaluation.  He denies any trouble with chest pain. No fevers chills or coughing. Past Medical History  Diagnosis Date  . Hypertension   . High potassium   . Varicose veins   . Hyperlipidemia   . Basal cell carcinoma of forehead 2016 X 2  . Basal cell carcinoma of left earlobe   . Old myocardial infarct     "sometime in the past; don't know when" (02/07/2015)  . Type II diabetes mellitus ([place])   . Pneumonia X 1  . GERD (gastroesophageal reflux disease)   . Arthritis     "legs" (02/07/2015)  . History of gout X 1  . Diabetic peripheral neuropathy ([place])     "left foot" (02/07/2015)  . Respiratory failure ([place])   . Pain and swelling of left lower leg     chronic   Past Surgical History  Procedure Laterality Date  . Knee arthroscopy Right ~ 2008  . Orif hip fracture  03/20/2012    Procedure: OPEN REDUCTION INTERNAL FIXATION HIP;  Surgeon: Sanjuana Kava, MD;  Location: [facility];  Service: Orthopedics;  Laterality: Right;  . Lower extremity venous doppler  07/11/2012    No evidence of DVT in the left lower extremity. Evidence of partially recanalized, chronic, non-obstructive thrombus in the left great  SV and its branches consistent with significant reflux consistent with post-phlebitic syndrome. Significant reflux of the left short saphenous vein.  . Cardiovascular stress test  05/29/2012    Mild-moderate perfusion defect due to infarct/scar with mild-moderate perinfarct ischemia seen in the mid anterior, apical anterior, apical septal, and apical regions. No ECG changes. Global LV systolic function is severely reduced.  . Transthoracic echocardiogram  04/18/2012    EF 45%, mild-moderate LVH  . Transthoracic echocardiogram  04/18/12    EF% 45%.SEVERE HYPOKINESIS TO AKINESIS OF THE MID-DISTAL INFEROLATERAL MYOCARDIUM AND MUCH OF THE APEX.  Marland Kitchen Lexiscam myocardial perfusion  05/29/12    MARKED PERFUSION DEFECT DUE TO INFARC/SCAR WITH MILD PERINFARCT ISCHEMIA IN THE BASAL INFERIOR, MID INFEROSEPTAL, MID INFERIOR AND APICALINFERIOR REGION. EF%33%. PERIINFARCT ISCHEMIA IN THE MID ANTERIOR, APICAL ANTERIOR, APICAL SEPTAL AND APICAL REGIONS.  . Fracture surgery    . Cystoscopy w/ stone manipulation  X 1  . Basal cell carcinoma excision      "probably 1/2 dozen cut off face, left ear" (02/07/2015)  . Cardiac catheterization N/A 02/09/2015    Procedure: Left Heart Cath and Coronary Angiography;  Surgeon: Leonie Man, MD;  Location: [facility];  Service: Cardiovascular;  Laterality: N/A;  . Coronary artery bypass graft N/A 02/17/2015  Procedure: CORONARY ARTERY BYPASS GRAFTING (CABG), ON PUMP, TIMES FIVE, USING LEFT INTERNAL MAMMARY ARTERY, RIGHT GREATER SAPHENOUS VEIN HARVESTED ENDOSCOPICALLY;  Surgeon: [name], MD;  Location: [place];  Service: Open Heart Surgery;  Laterality: N/A;  -LIMA to LAD -SVG to DIAGONAL - SEQ SVG to OM1 and PLB -SVG to PDA  . Tee without cardioversion N/A 02/17/2015    Procedure: TRANSESOPHAGEAL ECHOCARDIOGRAM (TEE);  Surgeon: [name], MD;  Location: [facility];  Service: Open Heart Surgery;  Laterality: N/A;  . Open heart sx  02/17/15   Family History   Problem Relation Age of Onset  . Deep vein thrombosis Mother   . Hypertension Mother   . Hypertension Sister   . Diabetes Brother   . Hyperlipidemia Brother   . Hypertension Brother   . Heart attack Brother    Social History  Substance Use Topics  . Smoking status: Never Smoker   . Smokeless tobacco: Never Used  . Alcohol Use: No    Review of Systems  All other systems reviewed and are negative.     Allergies  Bactrim  Home Medications   Prior to Admission medications   Medication Sig Start Date End Date Taking? Authorizing Provider  apixaban (ELIQUIS) 2.5 MG TABS tablet Take 1 tablet (2.5 mg total) by mouth 2 (two) times daily. 04/10/15  Yes Herminio Commons, MD  aspirin EC 81 MG tablet Take 81 mg by mouth every morning.   Yes Historical Provider, MD  cloNIDine (CATAPRES) 0.2 MG tablet Take 0.2 mg by mouth 2 (two) times daily. 01/20/15  Yes Historical Provider, MD  diclofenac sodium (VOLTAREN) 1 % GEL Apply 2 g topically 2 (two) times daily.   Yes Historical Provider, MD  gabapentin (NEURONTIN) 100 MG capsule Take 100 mg by mouth 3 (three) times daily.  04/01/14  Yes Historical Provider, MD  hydrALAZINE (APRESOLINE) 10 MG tablet Take 10 mg by mouth 3 (three) times daily.   Yes Historical Provider, MD  LEVEMIR FLEXTOUCH 100 UNIT/ML Pen Inject 27 Units into the skin at bedtime. 03/05/15  Yes Lezlie Octave Black, NP  LORazepam (ATIVAN) 1 MG tablet Take 1 tablet (1 mg total) by mouth at bedtime as needed for sleep. Patient taking differently: Take 1 mg by mouth at bedtime as needed for anxiety or sleep.  03/05/15  Yes Lauree Chandler, NP  losartan (COZAAR) 100 MG tablet Take 100 mg by mouth daily.   Yes Historical Provider, MD  traMADol (ULTRAM) 50 MG tablet Take 1 tablet (50 mg total) by mouth every 6 (six) hours as needed for moderate pain. 03/05/15  Yes Lauree Chandler, NP  atorvastatin (LIPITOR) 20 MG tablet TAKE 1 TABLET BY MOUTH DAILY AT 6:00 P.M. ( MUST SCHEDULE AN  APPOINTMENT FOR FUTURE REFILLS ) 03/27/14   Lorretta Harp, MD  ferrous sulfate 325 (65 FE) MG tablet Take 1 tablet (325 mg total) by mouth daily with breakfast. 02/24/15   Donielle Liston Alba, PA-C  furosemide (LASIX) 40 MG tablet Take 1 tablet (40 mg total) by mouth daily. 02/24/15   Donielle Liston Alba, PA-C   BP 177/77 mmHg  Pulse 67  Temp(Src) 98 F (36.7 C) (Oral)  Resp 19  Ht 5' 9.5" (1.765 m)  Wt 94.348 kg  BMI 30.29 kg/m2  SpO2 94% Physical Exam  Constitutional: He appears well-developed and well-nourished. No distress.  HENT:  Head: Normocephalic and atraumatic.  Right Ear: External ear normal.  Left Ear: External ear normal.  Eyes: Conjunctivae are normal. Right eye exhibits no discharge. Left eye exhibits no discharge. No scleral icterus.  Neck: Neck supple. No tracheal deviation present.  Cardiovascular: Normal rate, regular rhythm and intact distal pulses.   Pulmonary/Chest: Effort normal. No stridor. No respiratory distress. He has no wheezes. He has rales.  Abdominal: Soft. Bowel sounds are normal. He exhibits no distension. There is no tenderness. There is no rebound and no guarding.  Musculoskeletal: He exhibits edema. He exhibits no tenderness.  Neurological: He is alert. He has normal strength. No cranial nerve deficit (no facial droop, extraocular movements intact, no slurred speech) or sensory deficit. He exhibits normal muscle tone. He displays no seizure activity. Coordination normal.  Skin: Skin is warm and dry. No rash noted.  Psychiatric: He has a normal mood and affect.  Nursing note and vitals reviewed.   ED Course  Procedures (including critical care time) Labs Review Labs Reviewed  CBC - Abnormal; Notable for the following:    RBC 3.78 (*)    Hemoglobin 11.1 (*)    HCT 34.7 (*)    Platelets 140 (*)    All other components within normal limits  BASIC METABOLIC PANEL - Abnormal; Notable for the following:    Glucose, Bld 230 (*)    BUN 31 (*)     Creatinine, Ser 1.54 (*)    GFR calc non Af Amer 41 (*)    GFR calc Af Amer 47 (*)    All other components within normal limits  BRAIN NATRIURETIC PEPTIDE - Abnormal; Notable for the following:    B Natriuretic Peptide 1531.0 (*)    All other components within normal limits  PROTIME-INR - Abnormal; Notable for the following:    Prothrombin Time 16.6 (*)    All other components within normal limits  MAGNESIUM  I-STAT TROPOININ, ED    Imaging Review Dg Chest 2 View  10/03/2015  CLINICAL DATA:  82 year old with acute onset of shortness of breath. EXAM: CHEST  2 VIEW COMPARISON:  06/28/2015 and earlier. FINDINGS: Prior sternotomy for CABG. Cardiac silhouette moderately enlarged. Moderate diffuse interstitial and mild airspace pulmonary edema. Small bilateral pleural effusions. No confluent airspace consolidation. Degenerative changes and DISH involving the thoracic spine. IMPRESSION: Moderate CHF, with stable moderate cardiomegaly and diffuse interstitial and airspace pulmonary edema with small bilateral pleural effusions. Electronically Signed   By: Evangeline Dakin M.D.   On: 10/03/2015 20:22   I have personally reviewed and evaluated these images and lab results as part of my medical decision-making.   EKG Interpretation   Date/Time:  Saturday October 03 2015 17:43:41 EST Ventricular Rate:  75 PR Interval:  198 QRS Duration: 90 QT Interval:  404 QTC Calculation: 451 R Axis:   94 Text Interpretation:  Sinus rhythm Multiple ventricular premature  complexes Right axis deviation Anteroseptal infarct, old Minimal ST  depression, anterolateral leads  t wave abnormality has decreased from  prior tracing Confirmed by Cheila Wickstrom  MD-J, Analee Montee [id number]) on 10/03/2015 5:49:16  PM      MDM   Final diagnoses:  Acute on chronic congestive heart failure, unspecified congestive heart failure type ([place])    The patient's findings are consistent with an acute CHF exacerbation. Patient's primary doctor  tried increasing his Lasix over the past week. The patient has noticed increased leg swelling but continues to have orthopnea. Laboratory tests are consistent with a CHF exacerbation. Chest x-ray is also showing pulmonary edema consistent with CHF exacerbation. Plan on IVs Lasix. Nitrates for  vasodilation. Admit to the hospital for further treatment.    Dorie Rank, MD 10/03/15 2155

## 2015-10-03 NOTE — ED Notes (Signed)
Pt here for evaluation of hypertension. Pt states he has not taken his evening medication

## 2015-10-03 NOTE — H&P (Signed)
[facility] History and Physical  Zachary Mcmahon [id number] DOB: 06-01-1932 DOA: 10/03/2015  Referring physician:  PCP: Zachary Neighbors, MD   Chief Complaint: Shortness of breath  HPI: Zachary Mcmahon is a 82 y.o. male with a past medical history of diastolic congestive heart failure having his last transthoracic echocardiogram performed on 03/03/2015 that showed ejection fraction of [id number] with diastolic dysfunction, history of coronary artery disease is post coronary artery bypass grafting last cardiac catheterization performed on 02/09/2015 at which time he was found to have severe multivessel disease and decision made to pursue CABG. He presents to the emergency department with complaints of shortness of breath. He states dyspnea becoming progressively worse in the past 24 hours. He reports over the course of a week noticing increasing bilateral lower extremity pitting edema as well as orthopnea. He saw his primary care physician earlier this week noticed significant weight gain (reporting having a dry weight of 190 pounds, weighing 202 pounds at his PCP's office). He states that his PCP instructed him to double his dose of Lasix having increased urination over the last several days. He denies fevers, chills, chest pain, hemoptysis, abdominal pain. In the emergency department he was given 60 mg of IV Lasix for probable acute decompensated heart failure.                                   Review of Systems:  Constitutional:  No weight loss, night sweats, Fevers, chills, fatigue.  HEENT:  No headaches, Difficulty swallowing,Tooth/dental problems,Sore throat,  No sneezing, itching, ear ache, nasal congestion, post nasal drip,  Cardio-vascular:  No chest pain, positive for Orthopnea, PND, swelling in lower extremities, anasarca, he denies dizziness, palpitations  GI:  No heartburn, indigestion, abdominal pain, nausea, vomiting, diarrhea, change in bowel habits, loss of appetite  Resp:    Positive for shortness of breath with exertion or at rest. No excess mucus, no productive cough, No non-productive cough, No coughing up of blood.No change in color of mucus.No wheezing.No chest wall deformity  Skin:  no rash or lesions.  GU:  no dysuria, change in color of urine, no urgency or frequency. No flank pain.  Musculoskeletal:  No joint pain or swelling. No decreased range of motion. No back pain.  Psych:  No change in mood or affect. No depression or anxiety. No memory loss.   Past Medical History  Diagnosis Date  . Hypertension   . High potassium   . Varicose veins   . Hyperlipidemia   . Basal cell carcinoma of forehead 2016 X 2  . Basal cell carcinoma of left earlobe   . Old myocardial infarct     "sometime in the past; don't know when" (02/07/2015)  . Type II diabetes mellitus ([place])   . Pneumonia X 1  . GERD (gastroesophageal reflux disease)   . Arthritis     "legs" (02/07/2015)  . History of gout X 1  . Diabetic peripheral neuropathy ([place])     "left foot" (02/07/2015)  . Respiratory failure ([place])   . Pain and swelling of left lower leg     chronic   Past Surgical History  Procedure Laterality Date  . Knee arthroscopy Right ~ 2008  . Orif hip fracture  03/20/2012    Procedure: OPEN REDUCTION INTERNAL FIXATION HIP;  Surgeon: Zachary Kava, MD;  Location: [facility];  Service: Orthopedics;  Laterality: Right;  . Lower extremity venous  doppler  07/11/2012    No evidence of DVT in the left lower extremity. Evidence of partially recanalized, chronic, non-obstructive thrombus in the left great SV and its branches consistent with significant reflux consistent with post-phlebitic syndrome. Significant reflux of the left short saphenous vein.  . Cardiovascular stress test  05/29/2012    Mild-moderate perfusion defect due to infarct/scar with mild-moderate perinfarct ischemia seen in the mid anterior, apical anterior, apical septal, and apical regions. No ECG changes. Global LV  systolic function is severely reduced.  . Transthoracic echocardiogram  04/18/2012    EF 45%, mild-moderate LVH  . Transthoracic echocardiogram  04/18/12    EF% 45%.SEVERE HYPOKINESIS TO AKINESIS OF THE MID-DISTAL INFEROLATERAL MYOCARDIUM AND MUCH OF THE APEX.  Marland Kitchen Lexiscam myocardial perfusion  05/29/12    MARKED PERFUSION DEFECT DUE TO INFARC/SCAR WITH MILD PERINFARCT ISCHEMIA IN THE BASAL INFERIOR, MID INFEROSEPTAL, MID INFERIOR AND APICALINFERIOR REGION. EF%33%. PERIINFARCT ISCHEMIA IN THE MID ANTERIOR, APICAL ANTERIOR, APICAL SEPTAL AND APICAL REGIONS.  . Fracture surgery    . Cystoscopy w/ stone manipulation  X 1  . Basal cell carcinoma excision      "probably 1/2 dozen cut off face, left ear" (02/07/2015)  . Cardiac catheterization N/A 02/09/2015    Procedure: Left Heart Cath and Coronary Angiography;  Surgeon: Zachary Man, MD;  Location: [facility];  Service: Cardiovascular;  Laterality: N/A;  . Coronary artery bypass graft N/A 02/17/2015    Procedure: CORONARY ARTERY BYPASS GRAFTING (CABG), ON PUMP, TIMES FIVE, USING LEFT INTERNAL MAMMARY ARTERY, RIGHT GREATER SAPHENOUS VEIN HARVESTED ENDOSCOPICALLY;  Surgeon: Zachary Isaac, MD;  Location: [facility];  Service: Open Heart Surgery;  Laterality: N/A;  -LIMA to LAD -SVG to DIAGONAL - SEQ SVG to OM1 and PLB -SVG to PDA  . Tee without cardioversion N/A 02/17/2015    Procedure: TRANSESOPHAGEAL ECHOCARDIOGRAM (TEE);  Surgeon: Zachary Isaac, MD;  Location: [place];  Service: Open Heart Surgery;  Laterality: N/A;  . Open heart sx  02/17/15   Social History:  reports that he has never smoked. He has never used smokeless tobacco. He reports that he does not drink alcohol or use illicit drugs.  Allergies  Allergen Reactions  . Bactrim [Sulfamethoxazole-Trimethoprim] Rash    Family History  Problem Relation Age of Onset  . Deep vein thrombosis Mother   . Hypertension Mother   . Hypertension Sister   . Diabetes Brother   .  Hyperlipidemia Brother   . Hypertension Brother   . Heart attack Brother      Prior to Admission medications   Medication Sig Start Date End Date Taking? Authorizing Provider  apixaban (ELIQUIS) 2.5 MG TABS tablet Take 1 tablet (2.5 mg total) by mouth 2 (two) times daily. 04/10/15  Yes Herminio Commons, MD  aspirin EC 81 MG tablet Take 81 mg by mouth every morning.   Yes Historical Provider, MD  cloNIDine (CATAPRES) 0.2 MG tablet Take 0.2 mg by mouth 2 (two) times daily. 01/20/15  Yes Historical Provider, MD  diclofenac sodium (VOLTAREN) 1 % GEL Apply 2 g topically 2 (two) times daily.   Yes Historical Provider, MD  gabapentin (NEURONTIN) 100 MG capsule Take 100 mg by mouth 3 (three) times daily.  04/01/14  Yes Historical Provider, MD  hydrALAZINE (APRESOLINE) 10 MG tablet Take 10 mg by mouth 3 (three) times daily.   Yes Historical Provider, MD  LEVEMIR FLEXTOUCH 100 UNIT/ML Pen Inject 27 Units into the skin at bedtime. 03/05/15  Yes Lezlie Octave Black, NP  LORazepam (ATIVAN) 1 MG tablet Take 1 tablet (1 mg total) by mouth at bedtime as needed for sleep. Patient taking differently: Take 1 mg by mouth at bedtime as needed for anxiety or sleep.  03/05/15  Yes Lauree Chandler, NP  losartan (COZAAR) 100 MG tablet Take 100 mg by mouth daily.   Yes Historical Provider, MD  traMADol (ULTRAM) 50 MG tablet Take 1 tablet (50 mg total) by mouth every 6 (six) hours as needed for moderate pain. 03/05/15  Yes Lauree Chandler, NP  atorvastatin (LIPITOR) 20 MG tablet TAKE 1 TABLET BY MOUTH DAILY AT 6:00 P.M. ( MUST SCHEDULE AN APPOINTMENT FOR FUTURE REFILLS ) 03/27/14   Lorretta Harp, MD  ferrous sulfate 325 (65 FE) MG tablet Take 1 tablet (325 mg total) by mouth daily with breakfast. 02/24/15   Donielle Liston Alba, PA-C  furosemide (LASIX) 40 MG tablet Take 1 tablet (40 mg total) by mouth daily. 02/24/15   Nani Skillern, PA-C   Physical Exam: Filed Vitals:   10/03/15 2030 10/03/15 2100 10/03/15 2115  10/03/15 2130  BP: 149/78 178/89  177/77  Pulse: 67 78 68 67  Temp:      TempSrc:      Resp: 18 16 13 19   Height:      Weight:      SpO2: 95% 88% 96% 94%    Wt Readings from Last 3 Encounters:  10/03/15 94.348 kg (208 lb)  04/10/15 88.905 kg (196 lb)  04/02/15 90.719 kg (200 lb)    General:  Appears calm and comfortable, he is in no acute distress sitting comfortably. Common, cooperative able to provide history. Eyes: PERRL, normal lids, irises & conjunctiva ENT: grossly normal hearing, lips & tongue Neck: no LAD, masses or thyromegaly, positive jugular venous distention noted Cardiovascular: RRR, no m/r/g. Has 3+ bilateral extremity pitting edema Telemetry: SR, no arrhythmias  Respiratory: CTA bilaterally, no w/r/r. Normal respiratory effort. There were bibasilar inspiratory crackles noted on lung exam no wheezing or rhonchi Abdomen: soft, ntnd Skin: Venous stasis changes noted to by lateral ankles Musculoskeletal: grossly normal tone BUE/BLE Psychiatric: grossly normal mood and affect, speech fluent and appropriate Neurologic: grossly non-focal.          Labs on Admission:  Basic Metabolic Panel:  Recent Labs Lab 10/03/15 1948  NA 139  K 4.8  CL 106  CO2 27  GLUCOSE 230*  BUN 31*  CREATININE 1.54*  CALCIUM 9.3  MG 1.9   Liver Function Tests: No results for input(s): AST, ALT, ALKPHOS, BILITOT, PROT, ALBUMIN in the last 168 hours. No results for input(s): LIPASE, AMYLASE in the last 168 hours. No results for input(s): AMMONIA in the last 168 hours. CBC:  Recent Labs Lab 10/03/15 1948  WBC 8.5  HGB 11.1*  HCT 34.7*  MCV 91.8  PLT 140*   Cardiac Enzymes: No results for input(s): CKTOTAL, CKMB, CKMBINDEX, TROPONINI in the last 168 hours.  BNP (last 3 results)  Recent Labs  02/06/15 1612 10/03/15 1948  BNP 279.0* 1531.0*    ProBNP (last 3 results) No results for input(s): PROBNP in the last 8760 hours.  CBG: No results for input(s): GLUCAP  in the last 168 hours.  Radiological Exams on Admission: Dg Chest 2 View  10/03/2015  CLINICAL DATA:  82 year old with acute onset of shortness of breath. EXAM: CHEST  2 VIEW COMPARISON:  06/28/2015 and earlier. FINDINGS: Prior sternotomy for CABG. Cardiac silhouette moderately enlarged.  Moderate diffuse interstitial and mild airspace pulmonary edema. Small bilateral pleural effusions. No confluent airspace consolidation. Degenerative changes and DISH involving the thoracic spine. IMPRESSION: Moderate CHF, with stable moderate cardiomegaly and diffuse interstitial and airspace pulmonary edema with small bilateral pleural effusions. Electronically Signed   By: Evangeline Dakin M.D.   On: 10/03/2015 20:22    EKG: Independently reviewed.   Assessment/Plan Principal Problem:   Diastolic CHF (HCC) Active Problems:   DM type 2 (diabetes mellitus, type 2) (HCC)   Pulmonary edema   CHF (congestive heart failure) (HCC)   S/P CABG x 5   Chronic anticoagulation   CKD (chronic kidney disease), stage III   1. Acute on chronic diastolic congestive heart failure. Zachary Mcmahon presenting to the emergency department with clinical signs and symptoms consistent with acute CHF. He reports shortness of breath associated with increasing bilateral extremity pitting edema, orthopnea, and a 12 pound weight gain. He was recently instructed to double his dose of Lasix by his PCP. Despite this symptoms continue to worsen. In the emergency department BNP elevated at 1531 as a chest x-ray revealed findings consistent with CHF. He had been taking 40 mg of Lasix by mouth daily at home. Will increase his Lasix to 60 mg IV twice a day. Will obtain daily weights, monitor ins and outs and follow renal function closely. 2. History of coronary artery disease, status post coronary artery bypass grafting 5 on 02/17/2015 with LIMA to LAD, SVG to diagonal, sequential SVG to obtuse marginal and posterior lateral branch of RCA and SVG to  PDA. He presents with acute CHF. He denies chest pain. EKG did not reveal acute ischemic changes. Continue aspirin, losartan, statin. 3. Type 2 diabetes mellitus. Will continue his home regimen of Levemir 27 units subcutaneous at bedtime. Perform Accu-Cheks q AC and qHS with sliding scale coverage. 4. Hypertension. Will continue his home regimen with clonidine 0.2 mg by mouth twice a day, hydralazine 10 mg by mouth 3 times a day and Cozaar 100 mg by mouth daily. He is also been started on IV Lasix. Monitor blood pressures.  5. History of pulmonary embolism. Plan to continue chronic anticoagulation with Eliquis.  6. History of stage III chronic kidney disease. Has baseline creatinine near 1.5-1.8 range. Administering IV Lasix for acute CHF, will follow renal function closely. 7. Dyslipidemia. Continue statin therapy 8. DVT prophylaxis. Patient anticoagulated with Eliquis   Code Status: Full code Family Communication: Family not present Disposition Plan: Plan to admit to inpatient service, anticipate will require where the 2 nights hospitalization  Time spent: 70 min  Kelvin Cellar [facility] Pager [phone]

## 2015-10-04 ENCOUNTER — Inpatient Hospital Stay (HOSPITAL_COMMUNITY): Payer: Medicare Other

## 2015-10-04 DIAGNOSIS — Z794 Long term (current) use of insulin: Secondary | ICD-10-CM

## 2015-10-04 DIAGNOSIS — I509 Heart failure, unspecified: Secondary | ICD-10-CM

## 2015-10-04 DIAGNOSIS — Z7901 Long term (current) use of anticoagulants: Secondary | ICD-10-CM

## 2015-10-04 DIAGNOSIS — E11311 Type 2 diabetes mellitus with unspecified diabetic retinopathy with macular edema: Secondary | ICD-10-CM

## 2015-10-04 DIAGNOSIS — I5033 Acute on chronic diastolic (congestive) heart failure: Secondary | ICD-10-CM

## 2015-10-04 DIAGNOSIS — N183 Chronic kidney disease, stage 3 (moderate): Secondary | ICD-10-CM

## 2015-10-04 LAB — CBC
HCT: 34.7 % — ABNORMAL LOW (ref 39.0–52.0)
Hemoglobin: 10.8 g/dL — ABNORMAL LOW (ref 13.0–17.0)
MCH: 29 pg (ref 26.0–34.0)
MCHC: 31.1 g/dL (ref 30.0–36.0)
MCV: 93.3 fL (ref 78.0–100.0)
PLATELETS: 130 10*3/uL — AB (ref 150–400)
RBC: 3.72 MIL/uL — ABNORMAL LOW (ref 4.22–5.81)
RDW: 14.6 % (ref 11.5–15.5)
WBC: 7.1 10*3/uL (ref 4.0–10.5)

## 2015-10-04 LAB — GLUCOSE, CAPILLARY
GLUCOSE-CAPILLARY: 191 mg/dL — AB (ref 65–99)
GLUCOSE-CAPILLARY: 57 mg/dL — AB (ref 65–99)
Glucose-Capillary: 116 mg/dL — ABNORMAL HIGH (ref 65–99)
Glucose-Capillary: 134 mg/dL — ABNORMAL HIGH (ref 65–99)
Glucose-Capillary: 133 mg/dL — ABNORMAL HIGH (ref 65–99)
Glucose-Capillary: 183 mg/dL — ABNORMAL HIGH (ref 65–99)

## 2015-10-04 LAB — BASIC METABOLIC PANEL
ANION GAP: 6 (ref 5–15)
BUN: 30 mg/dL — AB (ref 6–20)
CALCIUM: 9.4 mg/dL (ref 8.9–10.3)
CO2: 31 mmol/L (ref 22–32)
CREATININE: 1.73 mg/dL — AB (ref 0.61–1.24)
Chloride: 106 mmol/L (ref 101–111)
GFR calc Af Amer: 41 mL/min — ABNORMAL LOW (ref >60)
GFR, EST NON AFRICAN AMERICAN: 35 mL/min — AB (ref >60)
GLUCOSE: 48 mg/dL — AB (ref 65–99)
Potassium: 4 mmol/L (ref 3.5–5.1)
Sodium: 143 mmol/L (ref 135–145)

## 2015-10-04 MED ORDER — INSULIN DETEMIR 100 UNIT/ML ~~LOC~~ SOLN
22.0000 [IU] | Freq: Every day | SUBCUTANEOUS | Status: DC
  Administered 2015-10-04 – 2015-10-05 (×2): 22 [IU] via SUBCUTANEOUS
  Filled 2015-10-04 (×3): qty 0.22

## 2015-10-04 MED ORDER — DOCUSATE SODIUM 100 MG PO CAPS
100.0000 mg | ORAL_CAPSULE | Freq: Every day | ORAL | Status: DC
  Administered 2015-10-04 – 2015-10-06 (×3): 100 mg via ORAL
  Filled 2015-10-04 (×3): qty 1

## 2015-10-04 NOTE — Progress Notes (Signed)
0912 Pt requesting Colace 100mg  PO QD & reports taking it at home OTC. MD notified.

## 2015-10-04 NOTE — Progress Notes (Signed)
  Echocardiogram 2D Echocardiogram has been performed.  Zachary Mcmahon 10/04/2015, 1:10 PM

## 2015-10-04 NOTE — Progress Notes (Signed)
Hypoglycemic Event  CBG: 57  Treatment: 15 GM carbohydrate snack  Symptoms: None  Follow-up CBG: Time: 0858 CBG Result: 116  Possible Reasons for Event: Inadequate meal intake  Comments/MD notified: Yes    Zachary Mcmahon

## 2015-10-04 NOTE — Progress Notes (Signed)
[facility] PROGRESS NOTE  Zachary Mcmahon [id number] DOB: 1932-01-14 DOA: 10/03/2015 PCP: Wende Neighbors, MD  Assessment/Plan: 1. Acute on chronic diastolic CHF. BNP on admission 1531. Started on IV Lasix with good UOP. Still has evidence of volume overload. Continue IV Lasix and monitor I&Os. ECHO has been ordered. Not started on BB due to bradycardia however he is on an ARB.  2. CAD, s/p CABGx5 on 02/17/15. Denies any CP. EKG non-acute. Continue ASA, Losartan, and Statin. 3. DM type 2, Patient had an episode of hypoglycemia this morning. Will decrease Levemir to 22U daily and continue SSI 4. Essential HTN, stable. Continue home medications. 5. Hx of PE, continue anticoagulation with Eliquis. 6. CKD stage III. Creatinine at baseline. Continue to monitor in the setting of diuresis.  7. HLD, continue statin.  Code Status: Full DVT prophylaxis: Eliquis Family Communication: No family at bedside.   Disposition Plan: Anticipate discharge in 1-2 days   Consultants:  none  Procedures:  none  Antibiotics:  none  HPI/Subjective:  Breathing is significantly improved. States he has gained a lot of weight recently due to fluid. LE swelling is improved.  Objective: Filed Vitals:   10/03/15 2234 10/04/15 0639  BP: 176/78 125/57  Pulse: 70 59  Temp: 98.2 F (36.8 C) 97.5 F (36.4 C)  Resp: 18 18    Intake/Output Summary (Last 24 hours) at 10/04/15 0806 Last data filed at 10/04/15 0700  Gross per 24 hour  Intake      0 ml  Output   1450 ml  Net  -1450 ml   Filed Weights   10/03/15 1741 10/03/15 2234 10/04/15 0639  Weight: 94.348 kg (208 lb) 97.569 kg (215 lb 1.6 oz) 95.482 kg (210 lb 8 oz)    Exam:  General: NAD, looks comfortable Cardiovascular: RRR, S1, S2  Respiratory: clear bilaterally, No wheezing, rales or rhonchi Abdomen: soft, non tender, no distention , bowel sounds normal Musculoskeletal: 1-2+ edema b/l  Data Reviewed: Basic Metabolic  Panel:  Recent Labs Lab 10/03/15 1948 10/04/15 0656  NA 139 143  K 4.8 4.0  CL 106 106  CO2 27 31  GLUCOSE 230* 48*  BUN 31* 30*  CREATININE 1.54* 1.73*  CALCIUM 9.3 9.4  MG 1.9  --     CBC:  Recent Labs Lab 10/03/15 1948 10/04/15 0656  WBC 8.5 7.1  HGB 11.1* 10.8*  HCT 34.7* 34.7*  MCV 91.8 93.3  PLT 140* 130*    BNP (last 3 results)  Recent Labs  02/06/15 1612 10/03/15 1948  BNP 279.0* 1531.0*    CBG:  Recent Labs Lab 10/03/15 2242  GLUCAP 191*     Studies: Dg Chest 2 View  10/03/2015  CLINICAL DATA:  82 year old with acute onset of shortness of breath. EXAM: CHEST  2 VIEW COMPARISON:  06/28/2015 and earlier. FINDINGS: Prior sternotomy for CABG. Cardiac silhouette moderately enlarged. Moderate diffuse interstitial and mild airspace pulmonary edema. Small bilateral pleural effusions. No confluent airspace consolidation. Degenerative changes and DISH involving the thoracic spine. IMPRESSION: Moderate CHF, with stable moderate cardiomegaly and diffuse interstitial and airspace pulmonary edema with small bilateral pleural effusions. Electronically Signed   By: Evangeline Dakin M.D.   On: 10/03/2015 20:22    Scheduled Meds: . apixaban  2.5 mg Oral BID  . aspirin EC  81 mg Oral q morning - 10a  . atorvastatin  20 mg Oral q1800  . cloNIDine  0.2 mg Oral BID  . furosemide  60 mg Intravenous BID  .  gabapentin  100 mg Oral TID  . hydrALAZINE  10 mg Oral TID  . insulin aspart  0-15 Units Subcutaneous TID WC  . insulin aspart  0-5 Units Subcutaneous QHS  . insulin detemir  27 Units Subcutaneous QHS  . losartan  100 mg Oral Daily  . sodium chloride flush  3 mL Intravenous Q12H   Continuous Infusions:   Principal Problem:   Diastolic CHF (HCC) Active Problems:   DM type 2 (diabetes mellitus, type 2) (HCC)   Pulmonary edema   CHF (congestive heart failure) (HCC)   S/P CABG x 5   Chronic anticoagulation   CKD (chronic kidney disease), stage III    Time  spent: 25  minutes   Augustine Leverette. MD  [facility] Pager [phone]. If 7PM-7AM, please contact night-coverage at [email or web address], password [facility] 10/04/2015, 8:06 AM  LOS: 1 day     By signing my name below, I, Rosalie Doctor, attest that this documentation has been prepared under the direction and in the presence of [facility]. MD Electronically Signed: Rosalie Doctor, Scribe. 10/04/2015 11:35am  I, Dr. Kathie Dike, personally performed the services described in this documentaiton. All medical record entries made by the scribe were at my direction and in my presence. I have reviewed the chart and agree that the record reflects my personal performance and is accurate and complete  Kathie Dike, MD, 10/04/2015 11:44 AM

## 2015-10-05 DIAGNOSIS — I5043 Acute on chronic combined systolic (congestive) and diastolic (congestive) heart failure: Secondary | ICD-10-CM

## 2015-10-05 LAB — GLUCOSE, CAPILLARY
GLUCOSE-CAPILLARY: 131 mg/dL — AB (ref 65–99)
GLUCOSE-CAPILLARY: 175 mg/dL — AB (ref 65–99)
Glucose-Capillary: 165 mg/dL — ABNORMAL HIGH (ref 65–99)
Glucose-Capillary: 178 mg/dL — ABNORMAL HIGH (ref 65–99)

## 2015-10-05 LAB — BASIC METABOLIC PANEL
Anion gap: 6 (ref 5–15)
BUN: 39 mg/dL — AB (ref 6–20)
CO2: 30 mmol/L (ref 22–32)
CREATININE: 1.86 mg/dL — AB (ref 0.61–1.24)
Calcium: 9.2 mg/dL (ref 8.9–10.3)
Chloride: 104 mmol/L (ref 101–111)
GFR calc Af Amer: 37 mL/min — ABNORMAL LOW (ref >60)
GFR, EST NON AFRICAN AMERICAN: 32 mL/min — AB (ref >60)
GLUCOSE: 181 mg/dL — AB (ref 65–99)
POTASSIUM: 4.2 mmol/L (ref 3.5–5.1)
SODIUM: 140 mmol/L (ref 135–145)

## 2015-10-05 MED ORDER — CAMPHOR-MENTHOL 0.5-0.5 % EX LOTN
TOPICAL_LOTION | CUTANEOUS | Status: DC | PRN
  Filled 2015-10-05: qty 222

## 2015-10-05 NOTE — Progress Notes (Signed)
[facility] PROGRESS NOTE  Zachary Mcmahon [id number] DOB: 01/21/1932 DOA: 10/03/2015 PCP: Wende Neighbors, MD  Assessment/Plan: 1. Acute on chronic combined CHF. EF of 40-45% with grade 2 diastolic dysfunction. BNP on admission 1531. Started on IV Lasix with good UOP. Still has mild evidence of volume overload. Continue IV Lasix and monitor I&Os. ECHO results below. Not started on BB due to bradycardia however he is on an ARB.  2. CAD, s/p CABGx5 on 02/17/15. . EKG non-acute. Continue ASA, Losartan, and Statin. Denies any CP 3. DM type 2, Patient had an episode of hypoglycemia, decreased Levemir to 22U daily and continue SSI. CBG now stable.  4. Essential HTN, stable. Continue home medications. 5. Hx of PE, continue anticoagulation with Eliquis. 6. CKD stage III. Creatinine at baseline. Continue to monitor in the setting of diuresis.  7. HLD, continue statin.  Code Status: Full DVT prophylaxis: Eliquis Family Communication: Family at bedside.   Disposition Plan: Anticipate discharge in 24 hours.    Consultants:  none  Procedures:   ECHO Study Conclusions - Left ventricle: The cavity size was mildly dilated. Wall thickness was normal. Systolic function was mildly to moderately reduced. The estimated ejection fraction was in the range of 40% to 45%. Hypokinesis of the inferior myocardium. Features are consistent with a pseudonormal left ventricular filling pattern, with concomitant abnormal relaxation and increased filling pressure (grade 2 diastolic dysfunction). - Mitral valve: There was mild to moderate regurgitation directed centrally. - Left atrium: The atrium was moderately dilated. - Right ventricle: The cavity size was mildly dilated. Systolic function was mildly reduced. - Right atrium: The atrium was mildly dilated.  Antibiotics:  none  HPI/Subjective: Breathing is doing much better. Still very tired.   Objective: Filed Vitals:   10/05/15  0100 10/05/15 0439  BP:  130/62  Pulse: 55 56  Temp:  98.4 F (36.9 C)  Resp: 16     Intake/Output Summary (Last 24 hours) at 10/05/15 0749 Last data filed at 10/05/15 0445  Gross per 24 hour  Intake    480 ml  Output   1925 ml  Net  -1445 ml   Filed Weights   10/03/15 2234 10/04/15 0639 10/05/15 0439  Weight: 97.569 kg (215 lb 1.6 oz) 95.482 kg (210 lb 8 oz) 95.482 kg (210 lb 8 oz)    Exam:  General: NAD, looks comfortable Cardiovascular: RRR, S1, S2  Respiratory: CTAB , No wheezing, rales or rhonchi Abdomen: soft, non tender, no distention , bowel sounds normal Musculoskeletal: 1+ edema b/l  Data Reviewed: Basic Metabolic Panel:  Recent Labs Lab 10/03/15 1948 10/04/15 0656 10/05/15 0521  NA 139 143 140  K 4.8 4.0 4.2  CL 106 106 104  CO2 27 31 30   GLUCOSE 230* 48* 181*  BUN 31* 30* 39*  CREATININE 1.54* 1.73* 1.86*  CALCIUM 9.3 9.4 9.2  MG 1.9  --   --     CBC:  Recent Labs Lab 10/03/15 1948 10/04/15 0656  WBC 8.5 7.1  HGB 11.1* 10.8*  HCT 34.7* 34.7*  MCV 91.8 93.3  PLT 140* 130*    BNP (last 3 results)  Recent Labs  02/06/15 1612 10/03/15 1948  BNP 279.0* 1531.0*    CBG:  Recent Labs Lab 10/04/15 0858 10/04/15 1058 10/04/15 1638 10/04/15 2125 10/05/15 0729  GLUCAP 116* 134* 133* 183* 131*     Studies: Dg Chest 2 View  10/03/2015  CLINICAL DATA:  82 year old with acute onset of shortness of breath. EXAM:  CHEST  2 VIEW COMPARISON:  06/28/2015 and earlier. FINDINGS: Prior sternotomy for CABG. Cardiac silhouette moderately enlarged. Moderate diffuse interstitial and mild airspace pulmonary edema. Small bilateral pleural effusions. No confluent airspace consolidation. Degenerative changes and DISH involving the thoracic spine. IMPRESSION: Moderate CHF, with stable moderate cardiomegaly and diffuse interstitial and airspace pulmonary edema with small bilateral pleural effusions. Electronically Signed   By: Evangeline Dakin M.D.   On:  10/03/2015 20:22    Scheduled Meds: . apixaban  2.5 mg Oral BID  . aspirin EC  81 mg Oral q morning - 10a  . atorvastatin  20 mg Oral q1800  . cloNIDine  0.2 mg Oral BID  . docusate sodium  100 mg Oral Daily  . furosemide  60 mg Intravenous BID  . gabapentin  100 mg Oral TID  . hydrALAZINE  10 mg Oral TID  . insulin aspart  0-15 Units Subcutaneous TID WC  . insulin aspart  0-5 Units Subcutaneous QHS  . insulin detemir  22 Units Subcutaneous QHS  . losartan  100 mg Oral Daily  . sodium chloride flush  3 mL Intravenous Q12H   Continuous Infusions:   Principal Problem:   Diastolic CHF (HCC) Active Problems:   DM type 2 (diabetes mellitus, type 2) (HCC)   Pulmonary edema   CHF (congestive heart failure) (HCC)   S/P CABG x 5   Chronic anticoagulation   CKD (chronic kidney disease), stage III    Time spent: 25  minutes   Lorah Kalina. MD  [facility] Pager [phone]. If 7PM-7AM, please contact night-coverage at [email or web address], password [facility] 10/05/2015, 7:49 AM  LOS: 2 days      By signing my name below, I, Rennis Harding, attest that this documentation has been prepared under the direction and in the presence of Kathie Dike, MD. Electronically signed: Rennis Harding, Scribe. 10/05/2015 1:05pm   I, Dr. Kathie Dike, personally performed the services described in this documentaiton. All medical record entries made by the scribe were at my direction and in my presence. I have reviewed the chart and agree that the record reflects my personal performance and is accurate and complete  Kathie Dike, MD, 10/05/2015 1:15 PM

## 2015-10-05 NOTE — Care Management Note (Signed)
Case Management Note  Patient Details  Name: Zachary Mcmahon MRN: [id number] Date of Birth: October 19, 1931  Subjective/Objective:            Discussed discharge planning with patient who is from home alert and oriented with spouse. Patient has round the clock nurses aids. His spouse has dementia.   Patient does not use [facility]. He would benefit from CHF nurse visits to the home. Offered choice and patient would like to use [facility]. Referral placed.       Action/Plan: Home with CHF nursing  Expected Discharge Date:                  Expected Discharge Plan:  [place]  In-House Referral:  NA  Discharge planning Services  CM Consult  Post Acute Care Choice:    Choice offered to:  Patient  DME Arranged:  N/A DME Agency:     HH Arranged:  RN (CHF Program) [place] Agency:  [place]  Status of Service:  Completed, signed off  Medicare Important Message Given:    Date Medicare IM Given:    Medicare IM give by:    Date Additional Medicare IM Given:    Additional Medicare Important Message give by:     If discussed at [place] of Stay Meetings, dates discussed:    Additional Comments:  Alvie Heidelberg, RN 10/05/2015, 2:28 PM

## 2015-10-06 ENCOUNTER — Ambulatory Visit: Payer: Medicare Other | Admitting: Cardiovascular Disease

## 2015-10-06 DIAGNOSIS — Z951 Presence of aortocoronary bypass graft: Secondary | ICD-10-CM

## 2015-10-06 DIAGNOSIS — J810 Acute pulmonary edema: Secondary | ICD-10-CM

## 2015-10-06 LAB — BASIC METABOLIC PANEL
Anion gap: 9 (ref 5–15)
BUN: 42 mg/dL — AB (ref 6–20)
CALCIUM: 9.6 mg/dL (ref 8.9–10.3)
CO2: 30 mmol/L (ref 22–32)
CREATININE: 1.95 mg/dL — AB (ref 0.61–1.24)
Chloride: 102 mmol/L (ref 101–111)
GFR calc Af Amer: 35 mL/min — ABNORMAL LOW (ref >60)
GFR, EST NON AFRICAN AMERICAN: 31 mL/min — AB (ref >60)
GLUCOSE: 146 mg/dL — AB (ref 65–99)
Potassium: 3.9 mmol/L (ref 3.5–5.1)
SODIUM: 141 mmol/L (ref 135–145)

## 2015-10-06 LAB — GLUCOSE, CAPILLARY
GLUCOSE-CAPILLARY: 117 mg/dL — AB (ref 65–99)
Glucose-Capillary: 192 mg/dL — ABNORMAL HIGH (ref 65–99)

## 2015-10-06 MED ORDER — TORSEMIDE 20 MG PO TABS: 20.0000 mg | ORAL_TABLET | Freq: Two times a day (BID) | ORAL | Status: DC

## 2015-10-06 NOTE — Progress Notes (Signed)
Back lotioned - Pt. complained of itching.Marland KitchenMarland KitchenMarland Kitchen

## 2015-10-06 NOTE — Discharge Summary (Signed)
Physician Discharge Summary  Zachary Mcmahon [id number] DOB: Sep 01, 1931 DOA: 10/03/2015  PCP: Wende Neighbors, MD  Admit date: 10/03/2015 Discharge date: 10/06/2015  Time spent: 35 minutes  Recommendations for Outpatient Follow-up:  1. Follow up with PCP within 1-2 weeks 2. Follow-up with cardiology in one week, as scheduled.  3. Repeat BMET on cardiology follow-up. 4. Patient will be set up with home health RN.   Discharge Diagnoses:  Principal Problem:   Acute on chronic combined systolic and diastolic CHF (congestive heart failure) (HCC) Active Problems:   DM type 2 (diabetes mellitus, type 2) (HCC)   Pulmonary edema   CHF (congestive heart failure) (HCC)   S/P CABG x 5   Chronic anticoagulation   CKD (chronic kidney disease), stage III   Discharge Condition: Improved  Diet recommendation: Heart healthy   Filed Weights   10/04/15 0639 10/05/15 0439 10/06/15 0600  Weight: 95.482 kg (210 lb 8 oz) 95.482 kg (210 lb 8 oz) 90.084 kg (198 lb 9.6 oz)    History of present illness:  82 yom with PMH of diastolic CHF with his last transthoracic echocardiogram performed on 03/03/2015 that showed ejection fraction of 99991111 with diastolic dysfunction, history of coronary artery disease is post coronary artery bypass grafting last cardiac catheterization performed on 02/09/2015 at which time he was found to have severe multivessel disease and decision made to pursue CABG presented with complaints of SOB and increased LE edema. While in the ED noted to be in volume overload  And admitted for further management.   Hospital Course:  On admission patient complained of SOB and LE edema and was found to be in acute on chronic combined CHF. EF of 40-45% with grade 2 diastolic dysfunction, which is a change from ECHO findings from 7/16. EF noted to be decreased from 50 to 55%. There was also noted to be inferior wall hypokinesis which was not present on prior ECHO. He did not have any chest pain or  acute EGK changes. Troponin noted to be normal in the ED. BNP on admission 1531. Started on IV Lasix with excellent dieresis.Not started on BB due to bradycardia and ARB withheld due to renal dysfunction. Case discussed with Dr. Harl Bowie who recommended outpatient evaluation changes noted on ECHO. On discharged counseled on importance on sodium and fluid restriction. Transitioned to oral Demadex on discharge.  1. CAD, s/p CABGx5 on 02/17/15. EKG non-acute. Continued ASA and Statin. Denied any CP. 2. DM type 2, Patient had an episode of hypoglycemia, decreased Levemir to 22U daily and continue SSI. CBG now stable.  3. Essential HTN, stable. Continue home medications. 4. Hx of PE, continue anticoagulation with Eliquis. 5. CKD stage III. Creatinine has trend up due to diuresis. He will need repeat labs on follow-up. Continue to monitor in the setting of diuresis.  6. HLD, continue statin.  Procedures:  ECHO Study Conclusions - Left ventricle: The cavity size was mildly dilated. Wall thickness was normal. Systolic function was mildly to moderately reduced. The estimated ejection fraction was in the range of 40% to 45%. Hypokinesis of the inferior myocardium. Features are consistent with a pseudonormal left ventricular filling pattern, with concomitant abnormal relaxation and increased filling pressure (grade 2 diastolic dysfunction). - Mitral valve: There was mild to moderate regurgitation directed centrally. - Left atrium: The atrium was moderately dilated. - Right ventricle: The cavity size was mildly dilated. Systolic function was mildly reduced. - Right atrium: The atrium was mildly dilated.  Consultations:  None  Discharge  Exam: Filed Vitals:   10/06/15 0600 10/06/15 1250  BP: 135/57 111/49  Pulse: 55 50  Temp: 97.6 F (36.4 C) 97.8 F (36.6 C)  Resp: 20 20    General: NAD, looks comfortable Cardiovascular: RRR, S1, S2  Respiratory: clear bilaterally, No  wheezing, rales or rhonchi Abdomen: soft, non tender, no distention , bowel sounds normal Musculoskeletal: 1+ edema b/l  Discharge Instructions   Discharge Instructions    (HEART FAILURE PATIENTS) Call MD:  Anytime you have any of the following symptoms: 1) 3 pound weight gain in 24 hours or 5 pounds in 1 week 2) shortness of breath, with or without a dry hacking cough 3) swelling in the hands, feet or stomach 4) if you have to sleep on extra pillows at night in order to breathe.    Complete by:  As directed      Diet - low sodium heart healthy    Complete by:  As directed      Face-to-face encounter (required for Medicare/Medicaid patients)    Complete by:  As directed   I Aaron Boeh certify that this patient is under my care and that I, or a nurse practitioner or physician's assistant working with me, had a face-to-face encounter that meets the physician face-to-face encounter requirements with this patient on 10/06/2015. The encounter with the patient was in whole, or in part for the following medical condition(s) which is the primary reason for home health care (List medical condition): chf exacerbation  The encounter with the patient was in whole, or in part, for the following medical condition, which is the primary reason for home health care:  chf exacerbation  I certify that, based on my findings, the following services are medically necessary home health services:   Nursing Physical therapy    Reason for Medically Necessary Home Health Services:  Skilled Nursing- Teaching of Disease Process/Symptom Management  My clinical findings support the need for the above services:  Shortness of breath with activity  Further, I certify that my clinical findings support that this patient is homebound due to:  Shortness of Breath with activity     Home Health    Complete by:  As directed   To provide the following care/treatments:   PT RN       Increase activity slowly    Complete by:  As  directed           Current Discharge Medication List    START taking these medications   Details  torsemide (DEMADEX) 20 MG tablet Take 1 tablet (20 mg total) by mouth 2 (two) times daily. Qty: 60 tablet, Refills: 0      CONTINUE these medications which have NOT CHANGED   Details  apixaban (ELIQUIS) 2.5 MG TABS tablet Take 1 tablet (2.5 mg total) by mouth 2 (two) times daily. Qty: 28 tablet, Refills: 0    aspirin EC 81 MG tablet Take 81 mg by mouth every morning.    cloNIDine (CATAPRES) 0.2 MG tablet Take 0.2 mg by mouth 2 (two) times daily.    diclofenac sodium (VOLTAREN) 1 % GEL Apply 2 g topically 2 (two) times daily.    gabapentin (NEURONTIN) 100 MG capsule Take 100 mg by mouth 3 (three) times daily.     hydrALAZINE (APRESOLINE) 10 MG tablet Take 10 mg by mouth 3 (three) times daily.    LEVEMIR FLEXTOUCH 100 UNIT/ML Pen Inject 27 Units into the skin at bedtime.    LORazepam (ATIVAN) 1 MG tablet  Take 1 tablet (1 mg total) by mouth at bedtime as needed for sleep. Qty: 30 tablet, Refills: 5    traMADol (ULTRAM) 50 MG tablet Take 1 tablet (50 mg total) by mouth every 6 (six) hours as needed for moderate pain. Qty: 120 tablet, Refills: 5    atorvastatin (LIPITOR) 20 MG tablet TAKE 1 TABLET BY MOUTH DAILY AT 6:00 P.M. ( MUST SCHEDULE AN APPOINTMENT FOR FUTURE REFILLS ) Qty: 30 tablet, Refills: 0    ferrous sulfate 325 (65 FE) MG tablet Take 1 tablet (325 mg total) by mouth daily with breakfast. Refills: 3      STOP taking these medications     losartan (COZAAR) 100 MG tablet      furosemide (LASIX) 40 MG tablet        Allergies  Allergen Reactions  . Bactrim [Sulfamethoxazole-Trimethoprim] Rash   Follow-up Information    Follow up with Herminio Commons, MD On 10/19/2015.   Specialty:  Cardiology   Why:  at 4:20 pm in [facility] information:   [place] [address] [phone]        The results of significant diagnostics from  this hospitalization (including imaging, microbiology, ancillary and laboratory) are listed below for reference.    Significant Diagnostic Studies: Dg Chest 2 View  10/03/2015  CLINICAL DATA:  82 year old with acute onset of shortness of breath. EXAM: CHEST  2 VIEW COMPARISON:  06/28/2015 and earlier. FINDINGS: Prior sternotomy for CABG. Cardiac silhouette moderately enlarged. Moderate diffuse interstitial and mild airspace pulmonary edema. Small bilateral pleural effusions. No confluent airspace consolidation. Degenerative changes and DISH involving the thoracic spine. IMPRESSION: Moderate CHF, with stable moderate cardiomegaly and diffuse interstitial and airspace pulmonary edema with small bilateral pleural effusions. Electronically Signed   By: Evangeline Dakin M.D.   On: 10/03/2015 20:22    Microbiology: No results found for this or any previous visit (from the past 240 hour(s)).   Labs: Basic Metabolic Panel:  Recent Labs Lab 10/03/15 1948 10/04/15 0656 10/05/15 0521 10/06/15 0610  NA 139 143 140 141  K 4.8 4.0 4.2 3.9  CL 106 106 104 102  CO2 27 31 30 30   GLUCOSE 230* 48* 181* 146*  BUN 31* 30* 39* 42*  CREATININE 1.54* 1.73* 1.86* 1.95*  CALCIUM 9.3 9.4 9.2 9.6  MG 1.9  --   --   --      Recent Labs Lab 10/03/15 1948 10/04/15 0656  WBC 8.5 7.1  HGB 11.1* 10.8*  HCT 34.7* 34.7*  MCV 91.8 93.3  PLT 140* 130*   BNP: BNP (last 3 results)  Recent Labs  02/06/15 1612 10/03/15 1948  BNP 279.0* 1531.0*    CBG:  Recent Labs Lab 10/05/15 1127 10/05/15 1617 10/05/15 2021 10/06/15 0759 10/06/15 1111  GLUCAP 165* 178* 175* 117* 192*       Signed:  Kathie Dike, MD  [facility] 10/06/2015, 2:08 PM     By signing my name below, I, Rennis Harding, attest that this documentation has been prepared under the direction and in the presence of Kathie Dike, MD. Electronically signed: Rennis Harding, Scribe. 10/06/2015 1:19  I, Dr.  Kathie Dike, personally performed the services described in this documentaiton. All medical record entries made by the scribe were at my direction and in my presence. I have reviewed the chart and agree that the record reflects my personal performance and is accurate and complete  Kathie Dike, MD, 10/06/2015 2:08  PM

## 2015-10-06 NOTE — Care Management Important Message (Signed)
Important Message  Patient Details  Name: Zachary Mcmahon MRN: [id number] Date of Birth: 1932-08-14   Medicare Important Message Given:  Yes    Alvie Heidelberg, RN 10/06/2015, 2:34 PM

## 2015-10-06 NOTE — Progress Notes (Signed)
Pt c/o itching to back and requested Anti-Itch medication. MD paged and orders received.

## 2015-10-06 NOTE — Progress Notes (Signed)
Discharge instructions read to patient and his family. All verbalized understanding of instructions.  Discharged to home with family

## 2015-10-07 DIAGNOSIS — I5043 Acute on chronic combined systolic (congestive) and diastolic (congestive) heart failure: Secondary | ICD-10-CM | POA: Diagnosis not present

## 2015-10-07 DIAGNOSIS — E1122 Type 2 diabetes mellitus with diabetic chronic kidney disease: Secondary | ICD-10-CM | POA: Diagnosis not present

## 2015-10-07 DIAGNOSIS — I2510 Atherosclerotic heart disease of native coronary artery without angina pectoris: Secondary | ICD-10-CM | POA: Diagnosis not present

## 2015-10-07 DIAGNOSIS — Z951 Presence of aortocoronary bypass graft: Secondary | ICD-10-CM | POA: Diagnosis not present

## 2015-10-07 DIAGNOSIS — K219 Gastro-esophageal reflux disease without esophagitis: Secondary | ICD-10-CM | POA: Diagnosis not present

## 2015-10-07 DIAGNOSIS — I129 Hypertensive chronic kidney disease with stage 1 through stage 4 chronic kidney disease, or unspecified chronic kidney disease: Secondary | ICD-10-CM | POA: Diagnosis not present

## 2015-10-07 DIAGNOSIS — E1142 Type 2 diabetes mellitus with diabetic polyneuropathy: Secondary | ICD-10-CM | POA: Diagnosis not present

## 2015-10-07 DIAGNOSIS — N183 Chronic kidney disease, stage 3 (moderate): Secondary | ICD-10-CM | POA: Diagnosis not present

## 2015-10-07 DIAGNOSIS — E785 Hyperlipidemia, unspecified: Secondary | ICD-10-CM | POA: Diagnosis not present

## 2015-10-14 DIAGNOSIS — N183 Chronic kidney disease, stage 3 (moderate): Secondary | ICD-10-CM | POA: Diagnosis not present

## 2015-10-14 DIAGNOSIS — I129 Hypertensive chronic kidney disease with stage 1 through stage 4 chronic kidney disease, or unspecified chronic kidney disease: Secondary | ICD-10-CM | POA: Diagnosis not present

## 2015-10-14 DIAGNOSIS — I5043 Acute on chronic combined systolic (congestive) and diastolic (congestive) heart failure: Secondary | ICD-10-CM | POA: Diagnosis not present

## 2015-10-14 DIAGNOSIS — Z951 Presence of aortocoronary bypass graft: Secondary | ICD-10-CM | POA: Diagnosis not present

## 2015-10-14 DIAGNOSIS — E1122 Type 2 diabetes mellitus with diabetic chronic kidney disease: Secondary | ICD-10-CM | POA: Diagnosis not present

## 2015-10-14 DIAGNOSIS — E785 Hyperlipidemia, unspecified: Secondary | ICD-10-CM | POA: Diagnosis not present

## 2015-10-14 DIAGNOSIS — I2510 Atherosclerotic heart disease of native coronary artery without angina pectoris: Secondary | ICD-10-CM | POA: Diagnosis not present

## 2015-10-14 DIAGNOSIS — E1142 Type 2 diabetes mellitus with diabetic polyneuropathy: Secondary | ICD-10-CM | POA: Diagnosis not present

## 2015-10-14 DIAGNOSIS — K219 Gastro-esophageal reflux disease without esophagitis: Secondary | ICD-10-CM | POA: Diagnosis not present

## 2015-10-16 DIAGNOSIS — K219 Gastro-esophageal reflux disease without esophagitis: Secondary | ICD-10-CM | POA: Diagnosis not present

## 2015-10-16 DIAGNOSIS — N183 Chronic kidney disease, stage 3 (moderate): Secondary | ICD-10-CM | POA: Diagnosis not present

## 2015-10-16 DIAGNOSIS — E785 Hyperlipidemia, unspecified: Secondary | ICD-10-CM | POA: Diagnosis not present

## 2015-10-16 DIAGNOSIS — E1142 Type 2 diabetes mellitus with diabetic polyneuropathy: Secondary | ICD-10-CM | POA: Diagnosis not present

## 2015-10-16 DIAGNOSIS — I5043 Acute on chronic combined systolic (congestive) and diastolic (congestive) heart failure: Secondary | ICD-10-CM | POA: Diagnosis not present

## 2015-10-16 DIAGNOSIS — Z951 Presence of aortocoronary bypass graft: Secondary | ICD-10-CM | POA: Diagnosis not present

## 2015-10-16 DIAGNOSIS — I129 Hypertensive chronic kidney disease with stage 1 through stage 4 chronic kidney disease, or unspecified chronic kidney disease: Secondary | ICD-10-CM | POA: Diagnosis not present

## 2015-10-16 DIAGNOSIS — E1122 Type 2 diabetes mellitus with diabetic chronic kidney disease: Secondary | ICD-10-CM | POA: Diagnosis not present

## 2015-10-16 DIAGNOSIS — I2510 Atherosclerotic heart disease of native coronary artery without angina pectoris: Secondary | ICD-10-CM | POA: Diagnosis not present

## 2015-10-19 ENCOUNTER — Ambulatory Visit (INDEPENDENT_AMBULATORY_CARE_PROVIDER_SITE_OTHER): Payer: Medicare Other | Admitting: Cardiovascular Disease

## 2015-10-19 ENCOUNTER — Ambulatory Visit: Payer: Medicare Other | Admitting: Cardiovascular Disease

## 2015-10-19 ENCOUNTER — Encounter: Payer: Self-pay | Admitting: *Deleted

## 2015-10-19 ENCOUNTER — Encounter: Payer: Self-pay | Admitting: Cardiovascular Disease

## 2015-10-19 ENCOUNTER — Telehealth: Payer: Self-pay | Admitting: Cardiovascular Disease

## 2015-10-19 VITALS — BP 158/88 | HR 140 | Ht 67.0 in | Wt 216.0 lb

## 2015-10-19 DIAGNOSIS — N183 Chronic kidney disease, stage 3 (moderate): Secondary | ICD-10-CM

## 2015-10-19 DIAGNOSIS — Z951 Presence of aortocoronary bypass graft: Secondary | ICD-10-CM

## 2015-10-19 DIAGNOSIS — I429 Cardiomyopathy, unspecified: Secondary | ICD-10-CM | POA: Diagnosis not present

## 2015-10-19 DIAGNOSIS — Z87898 Personal history of other specified conditions: Secondary | ICD-10-CM

## 2015-10-19 DIAGNOSIS — Z9289 Personal history of other medical treatment: Secondary | ICD-10-CM

## 2015-10-19 DIAGNOSIS — I2782 Chronic pulmonary embolism: Secondary | ICD-10-CM | POA: Diagnosis not present

## 2015-10-19 DIAGNOSIS — R000 Tachycardia, unspecified: Secondary | ICD-10-CM | POA: Diagnosis not present

## 2015-10-19 DIAGNOSIS — I25812 Atherosclerosis of bypass graft of coronary artery of transplanted heart without angina pectoris: Secondary | ICD-10-CM

## 2015-10-19 DIAGNOSIS — I5021 Acute systolic (congestive) heart failure: Secondary | ICD-10-CM

## 2015-10-19 DIAGNOSIS — I10 Essential (primary) hypertension: Secondary | ICD-10-CM

## 2015-10-19 MED ORDER — CARVEDILOL 3.125 MG PO TABS: 3.1250 mg | ORAL_TABLET | Freq: Two times a day (BID) | ORAL | Status: DC

## 2015-10-19 NOTE — Progress Notes (Signed)
Patient ID: Zachary Mcmahon, male   DOB: [birth year], 82 y.o.   MRN: [id number]      SUBJECTIVE: The patient is an 82 year old male was recently discharged after being hospitalized for acute combined systolic and diastolic heart failure. He has a history of coronary artery disease and CABG. This is my first time meeting him.  Echocardiogram performed earlier this month demonstrated mild to moderately reduced left ventricle systolic function, EF A999333, with inferior wall hypokinesis, grade 2 diastolic dysfunction , and mild to moderate mitral regurgitation. He underwent 5 vessel coronary artery bypass graft surgery on 02/17/15. He has a history of pulmonary embolism and is anticoagulated with Eliquis. He also has chronic kidney disease stage III. The EF had dropped and had previously been normal , 50-55%, in July 2016.  Discharge wt: 210 lbs Wt today: 216 lbs   He said, "I feel great" and denies chest pain, leg swelling, palpitations, and shortness of breath.   ECG performed in the office today demonstrates tachycardia, heart rate 130 bpm, with a nonspecific T wave abnormality.    Review of Systems: As per "subjective", otherwise negative.  Allergies  Allergen Reactions  . Bactrim [Sulfamethoxazole-Trimethoprim] Rash    Current Outpatient Prescriptions  Medication Sig Dispense Refill  . apixaban (ELIQUIS) 2.5 MG TABS tablet Take 1 tablet (2.5 mg total) by mouth 2 (two) times daily. 28 tablet 0  . aspirin EC 81 MG tablet Take 81 mg by mouth every morning.    Marland Kitchen atorvastatin (LIPITOR) 20 MG tablet TAKE 1 TABLET BY MOUTH DAILY AT 6:00 P.M. ( MUST SCHEDULE AN APPOINTMENT FOR FUTURE REFILLS ) 30 tablet 0  . cloNIDine (CATAPRES) 0.2 MG tablet Take 0.2 mg by mouth 2 (two) times daily.    . diclofenac sodium (VOLTAREN) 1 % GEL Apply 2 g topically 2 (two) times daily.    . ferrous sulfate 325 (65 FE) MG tablet Take 1 tablet (325 mg total) by mouth daily with breakfast.  3  . gabapentin  (NEURONTIN) 100 MG capsule Take 100 mg by mouth 3 (three) times daily.     . hydrALAZINE (APRESOLINE) 10 MG tablet Take 10 mg by mouth 3 (three) times daily.    Marland Kitchen LEVEMIR FLEXTOUCH 100 UNIT/ML Pen Inject 27 Units into the skin at bedtime.    Marland Kitchen LORazepam (ATIVAN) 1 MG tablet Take 1 tablet (1 mg total) by mouth at bedtime as needed for sleep. (Patient taking differently: Take 1 mg by mouth at bedtime as needed for anxiety or sleep. ) 30 tablet 5  . torsemide (DEMADEX) 20 MG tablet Take 1 tablet (20 mg total) by mouth 2 (two) times daily. 60 tablet 0  . traMADol (ULTRAM) 50 MG tablet Take 1 tablet (50 mg total) by mouth every 6 (six) hours as needed for moderate pain. 120 tablet 5   No current facility-administered medications for this visit.    Past Medical History  Diagnosis Date  . Hypertension   . High potassium   . Varicose veins   . Hyperlipidemia   . Basal cell carcinoma of forehead 2016 X 2  . Basal cell carcinoma of left earlobe   . Old myocardial infarct     "sometime in the past; don't know when" (02/07/2015)  . Type II diabetes mellitus ([place])   . Pneumonia X 1  . GERD (gastroesophageal reflux disease)   . Arthritis     "legs" (02/07/2015)  . History of gout X 1  . Diabetic peripheral neuropathy ([place])     "  left foot" (02/07/2015)  . Respiratory failure ([place])   . Pain and swelling of left lower leg     chronic    Past Surgical History  Procedure Laterality Date  . Knee arthroscopy Right ~ 2008  . Orif hip fracture  03/20/2012    Procedure: OPEN REDUCTION INTERNAL FIXATION HIP;  Surgeon: Sanjuana Kava, MD;  Location: [facility];  Service: Orthopedics;  Laterality: Right;  . Lower extremity venous doppler  07/11/2012    No evidence of DVT in the left lower extremity. Evidence of partially recanalized, chronic, non-obstructive thrombus in the left great SV and its branches consistent with significant reflux consistent with post-phlebitic syndrome. Significant reflux of the left  short saphenous vein.  . Cardiovascular stress test  05/29/2012    Mild-moderate perfusion defect due to infarct/scar with mild-moderate perinfarct ischemia seen in the mid anterior, apical anterior, apical septal, and apical regions. No ECG changes. Global LV systolic function is severely reduced.  . Transthoracic echocardiogram  04/18/2012    EF 45%, mild-moderate LVH  . Transthoracic echocardiogram  04/18/12    EF% 45%.SEVERE HYPOKINESIS TO AKINESIS OF THE MID-DISTAL INFEROLATERAL MYOCARDIUM AND MUCH OF THE APEX.  Marland Kitchen Lexiscam myocardial perfusion  05/29/12    MARKED PERFUSION DEFECT DUE TO INFARC/SCAR WITH MILD PERINFARCT ISCHEMIA IN THE BASAL INFERIOR, MID INFEROSEPTAL, MID INFERIOR AND APICALINFERIOR REGION. EF%33%. PERIINFARCT ISCHEMIA IN THE MID ANTERIOR, APICAL ANTERIOR, APICAL SEPTAL AND APICAL REGIONS.  . Fracture surgery    . Cystoscopy w/ stone manipulation  X 1  . Basal cell carcinoma excision      "probably 1/2 dozen cut off face, left ear" (02/07/2015)  . Cardiac catheterization N/A 02/09/2015    Procedure: Left Heart Cath and Coronary Angiography;  Surgeon: Leonie Man, MD;  Location: [facility];  Service: Cardiovascular;  Laterality: N/A;  . Coronary artery bypass graft N/A 02/17/2015    Procedure: CORONARY ARTERY BYPASS GRAFTING (CABG), ON PUMP, TIMES FIVE, USING LEFT INTERNAL MAMMARY ARTERY, RIGHT GREATER SAPHENOUS VEIN HARVESTED ENDOSCOPICALLY;  Surgeon: [name], MD;  Location: [facility];  Service: Open Heart Surgery;  Laterality: N/A;  -LIMA to LAD -SVG to DIAGONAL - SEQ SVG to OM1 and PLB -SVG to PDA  . Tee without cardioversion N/A 02/17/2015    Procedure: TRANSESOPHAGEAL ECHOCARDIOGRAM (TEE);  Surgeon: [name], MD;  Location: [facility];  Service: Open Heart Surgery;  Laterality: N/A;  . Open heart sx  02/17/15    Social History   Social History  . Marital Status: Married    Spouse Name: N/A  . Number of Children: N/A  . Years of Education: N/A    Occupational History  . Not on file.   Social History Main Topics  . Smoking status: Never Smoker   . Smokeless tobacco: Never Used  . Alcohol Use: No  . Drug Use: No  . Sexual Activity: Not Currently   Other Topics Concern  . Not on file   Social History Narrative     Filed Vitals:   10/19/15 1432  BP: 158/88  Pulse: 140  Height: 5\' 7"  (1.702 m)  Weight: 216 lb (97.977 kg)  SpO2: 98%    PHYSICAL EXAM General: NAD HEENT: Normal. Neck: No JVD, no thyromegaly. Lungs: Clear to auscultation bilaterally with normal respiratory effort. CV: Tachycardic, regular rhythm, normal S1/S2, no S3/S4, no murmur. No pretibial or periankle edema.  Abdomen: Soft, nontender, no distention.  Neurologic: Alert and oriented.  Psych: Normal affect. Skin: Normal.  Musculoskeletal: No gross deformities.  ECG: Most recent ECG reviewed.      ASSESSMENT AND PLAN: 1. Chronic combined systolic and diastolic heart failure: On torsemide 20 mg bid. Symptomatically stable from this standpoint but I am concerned about an eminent decompensation given his tachycardia. Will start Coreg 3.125 mg bid. Given reduction in EF, will obtain Lexiscan Cardiolite to evaluate for ischemic etiology.  2. CAD s/p 5-v CABG: On ASA and Lipitor. Given reduction in EF, will obtain Lexiscan Cardiolite to evaluate for ischemic etiology. Will start Coreg due to tachycardia and elevated BP.  3. Pulmonary embolism: Continue Eliquis.  4. Essential HTN: Elevated. On hydralazine. Will start Coreg 3.125 mg bid.  5. Hyperlipidemia: Continue Lipitor.  6. Tachycardia: See #1.  Dispo: f/u 1 week with ECG.  Time spent: 40 minutes, of which greater than 50% was spent reviewing symptoms, relevant blood tests and studies, and discussing management plan with the patient.    Kate Sable, M.D., F.A.C.C.

## 2015-10-19 NOTE — Telephone Encounter (Signed)
Lexiscan - cardiomyopathy [facility] March 3 arrive 7am

## 2015-10-19 NOTE — Patient Instructions (Addendum)
   Begin Coreg 3.125mg  twice a day  - new sent to [facility] today. Continue all other medications.   Your physician has requested that you have a lexiscan myoview. For further information please visit [email or web address]. Please follow instruction sheet, as given. Office will contact with results via phone or letter.   Follow up on 10/27/2015.

## 2015-10-20 NOTE — Telephone Encounter (Signed)
10/20/2015 NO AUTH# RQD PER REDUCATION PROGRAM DAJ

## 2015-10-21 DIAGNOSIS — E1142 Type 2 diabetes mellitus with diabetic polyneuropathy: Secondary | ICD-10-CM | POA: Diagnosis not present

## 2015-10-21 DIAGNOSIS — I5043 Acute on chronic combined systolic (congestive) and diastolic (congestive) heart failure: Secondary | ICD-10-CM | POA: Diagnosis not present

## 2015-10-21 DIAGNOSIS — I129 Hypertensive chronic kidney disease with stage 1 through stage 4 chronic kidney disease, or unspecified chronic kidney disease: Secondary | ICD-10-CM | POA: Diagnosis not present

## 2015-10-21 DIAGNOSIS — N183 Chronic kidney disease, stage 3 (moderate): Secondary | ICD-10-CM | POA: Diagnosis not present

## 2015-10-21 DIAGNOSIS — I2510 Atherosclerotic heart disease of native coronary artery without angina pectoris: Secondary | ICD-10-CM | POA: Diagnosis not present

## 2015-10-21 DIAGNOSIS — E785 Hyperlipidemia, unspecified: Secondary | ICD-10-CM | POA: Diagnosis not present

## 2015-10-21 DIAGNOSIS — Z951 Presence of aortocoronary bypass graft: Secondary | ICD-10-CM | POA: Diagnosis not present

## 2015-10-21 DIAGNOSIS — E1122 Type 2 diabetes mellitus with diabetic chronic kidney disease: Secondary | ICD-10-CM | POA: Diagnosis not present

## 2015-10-21 DIAGNOSIS — K219 Gastro-esophageal reflux disease without esophagitis: Secondary | ICD-10-CM | POA: Diagnosis not present

## 2015-10-23 ENCOUNTER — Encounter (HOSPITAL_COMMUNITY)
Admission: RE | Admit: 2015-10-23 | Discharge: 2015-10-23 | Disposition: A | Discharge status: Home / Self Care | Payer: Medicare Other | Source: Ambulatory Visit | Attending: Cardiovascular Disease | Admitting: Cardiovascular Disease

## 2015-10-23 ENCOUNTER — Inpatient Hospital Stay (HOSPITAL_COMMUNITY): Admission: RE | Admit: 2015-10-23 | Payer: Medicare Other | Source: Ambulatory Visit

## 2015-10-23 ENCOUNTER — Encounter (HOSPITAL_COMMUNITY): Payer: Self-pay

## 2015-10-23 DIAGNOSIS — I2589 Other forms of chronic ischemic heart disease: Secondary | ICD-10-CM | POA: Diagnosis not present

## 2015-10-23 DIAGNOSIS — I429 Cardiomyopathy, unspecified: Secondary | ICD-10-CM

## 2015-10-23 DIAGNOSIS — I5189 Other ill-defined heart diseases: Secondary | ICD-10-CM | POA: Diagnosis not present

## 2015-10-23 HISTORY — DX: Disorder of kidney and ureter, unspecified: N28.9

## 2015-10-23 LAB — NM MYOCAR MULTI W/SPECT W/WALL MOTION / EF
CHL CUP NUCLEAR SDS: 10
CHL CUP NUCLEAR SRS: 20
LV sys vol: 182 mL
LVDIAVOL: 233 mL
NUC STRESS TID: 1.04
Peak HR: 89 {beats}/min
RATE: 0.29
Rest HR: 73 {beats}/min
SSS: 30

## 2015-10-23 MED ORDER — REGADENOSON 0.4 MG/5ML IV SOLN
INTRAVENOUS | Status: AC
  Administered 2015-10-23: 0.4 mg via INTRAVENOUS
  Filled 2015-10-23: qty 5

## 2015-10-23 MED ORDER — TECHNETIUM TC 99M SESTAMIBI GENERIC - CARDIOLITE
30.0000 | Freq: Once | INTRAVENOUS | Status: AC | PRN
  Administered 2015-10-23: 30 via INTRAVENOUS

## 2015-10-23 MED ORDER — SODIUM CHLORIDE 0.9% FLUSH
INTRAVENOUS | Status: AC
  Administered 2015-10-23: 10 mL via INTRAVENOUS
  Filled 2015-10-23: qty 10

## 2015-10-23 MED ORDER — TECHNETIUM TC 99M SESTAMIBI - CARDIOLITE
10.0000 | Freq: Once | INTRAVENOUS | Status: AC | PRN
  Administered 2015-10-23: 9 via INTRAVENOUS

## 2015-10-26 DIAGNOSIS — I509 Heart failure, unspecified: Secondary | ICD-10-CM | POA: Diagnosis not present

## 2015-10-26 DIAGNOSIS — I10 Essential (primary) hypertension: Secondary | ICD-10-CM | POA: Diagnosis not present

## 2015-10-27 ENCOUNTER — Encounter: Payer: Self-pay | Admitting: Cardiovascular Disease

## 2015-10-27 ENCOUNTER — Ambulatory Visit (INDEPENDENT_AMBULATORY_CARE_PROVIDER_SITE_OTHER): Payer: Medicare Other | Admitting: Cardiovascular Disease

## 2015-10-27 ENCOUNTER — Other Ambulatory Visit: Payer: Self-pay | Admitting: Cardiovascular Disease

## 2015-10-27 VITALS — BP 168/78 | HR 84 | Ht 67.0 in | Wt 200.0 lb

## 2015-10-27 DIAGNOSIS — I429 Cardiomyopathy, unspecified: Secondary | ICD-10-CM

## 2015-10-27 DIAGNOSIS — R9439 Abnormal result of other cardiovascular function study: Secondary | ICD-10-CM

## 2015-10-27 DIAGNOSIS — R000 Tachycardia, unspecified: Secondary | ICD-10-CM

## 2015-10-27 DIAGNOSIS — I25812 Atherosclerosis of bypass graft of coronary artery of transplanted heart without angina pectoris: Secondary | ICD-10-CM

## 2015-10-27 DIAGNOSIS — I5042 Chronic combined systolic (congestive) and diastolic (congestive) heart failure: Secondary | ICD-10-CM | POA: Diagnosis not present

## 2015-10-27 DIAGNOSIS — R931 Abnormal findings on diagnostic imaging of heart and coronary circulation: Secondary | ICD-10-CM | POA: Diagnosis not present

## 2015-10-27 DIAGNOSIS — I2782 Chronic pulmonary embolism: Secondary | ICD-10-CM

## 2015-10-27 DIAGNOSIS — I10 Essential (primary) hypertension: Secondary | ICD-10-CM

## 2015-10-27 DIAGNOSIS — Z951 Presence of aortocoronary bypass graft: Secondary | ICD-10-CM | POA: Diagnosis not present

## 2015-10-27 DIAGNOSIS — N183 Chronic kidney disease, stage 3 (moderate): Secondary | ICD-10-CM

## 2015-10-27 NOTE — Progress Notes (Signed)
Patient ID: Zachary Mcmahon, male   DOB: 1932-03-25, 82 y.o.   MRN: [id number]      SUBJECTIVE: The patient returns for follow-up of coronary artery disease and chronic systolic and diastolic heart failure. Nuclear stress test on 10/23/15 demonstrated a large defect involving the inferoseptal, inferior, and lateral wall extending from the apex to the base with some degree of reversibility suggestive of moderate lateral ischemia. It was deemed a high risk study.  Echocardiogram performed on 10/04/15 demonstrated mild to moderately reduced left ventricular systolic function, EF A999333, with inferior wall hypokinesis, grade 2 diastolic dysfunction , and mild to moderate mitral regurgitation.   He underwent 5 vessel coronary artery bypass graft surgery on 02/17/15. He has a history of pulmonary embolism and is anticoagulated with Eliquis. He also has chronic kidney disease stage III. The EF had dropped and had previously been normal , 50-55%, in July 2016.  He has been walking today climbing stairs and walking up and down hills and denies exertional chest pain and shortness of breath. He is now walking without a cane.  Review of Systems: As per "subjective", otherwise negative.  Allergies  Allergen Reactions  . Bactrim [Sulfamethoxazole-Trimethoprim] Rash    Current Outpatient Prescriptions  Medication Sig Dispense Refill  . apixaban (ELIQUIS) 2.5 MG TABS tablet Take 1 tablet (2.5 mg total) by mouth 2 (two) times daily. 28 tablet 0  . aspirin EC 81 MG tablet Take 81 mg by mouth every morning.    Marland Kitchen atorvastatin (LIPITOR) 20 MG tablet TAKE 1 TABLET BY MOUTH DAILY AT 6:00 P.M. ( MUST SCHEDULE AN APPOINTMENT FOR FUTURE REFILLS ) 30 tablet 0  . carvedilol (COREG) 3.125 MG tablet TAKE 1 TABLET BY MOUTH TWICE DAILY 60 tablet 3  . cloNIDine (CATAPRES) 0.2 MG tablet Take 0.2 mg by mouth 2 (two) times daily.    . diclofenac sodium (VOLTAREN) 1 % GEL Apply 2 g topically 2 (two) times daily.    Marland Kitchen ELIQUIS 2.5  MG TABS tablet TAKE ONE TABLET BY MOUTH TWICE DAILY 60 tablet 6  . ferrous sulfate 325 (65 FE) MG tablet Take 1 tablet (325 mg total) by mouth daily with breakfast.  3  . gabapentin (NEURONTIN) 100 MG capsule Take 100 mg by mouth 3 (three) times daily.     . hydrALAZINE (APRESOLINE) 10 MG tablet Take 10 mg by mouth 3 (three) times daily.    Marland Kitchen LEVEMIR FLEXTOUCH 100 UNIT/ML Pen Inject 27 Units into the skin at bedtime.    Marland Kitchen LORazepam (ATIVAN) 1 MG tablet Take 1 tablet (1 mg total) by mouth at bedtime as needed for sleep. (Patient taking differently: Take 1 mg by mouth at bedtime as needed for anxiety or sleep. ) 30 tablet 5  . torsemide (DEMADEX) 20 MG tablet Take 1 tablet (20 mg total) by mouth 2 (two) times daily. 60 tablet 0  . traMADol (ULTRAM) 50 MG tablet Take 1 tablet (50 mg total) by mouth every 6 (six) hours as needed for moderate pain. 120 tablet 5   No current facility-administered medications for this visit.    Past Medical History  Diagnosis Date  . Hypertension   . High potassium   . Varicose veins   . Hyperlipidemia   . Basal cell carcinoma of forehead 2016 X 2  . Basal cell carcinoma of left earlobe   . Old myocardial infarct     "sometime in the past; don't know when" (02/07/2015)  . Type II diabetes mellitus ([place])   .  Pneumonia X 1  . GERD (gastroesophageal reflux disease)   . Arthritis     "legs" (02/07/2015)  . History of gout X 1  . Diabetic peripheral neuropathy ([place])     "left foot" (02/07/2015)  . Respiratory failure ([place])   . Pain and swelling of left lower leg     chronic  . Renal insufficiency     Past Surgical History  Procedure Laterality Date  . Knee arthroscopy Right ~ 2008  . Orif hip fracture  03/20/2012    Procedure: OPEN REDUCTION INTERNAL FIXATION HIP;  Surgeon: Sanjuana Kava, MD;  Location: [facility];  Service: Orthopedics;  Laterality: Right;  . Lower extremity venous doppler  07/11/2012    No evidence of DVT in the left lower extremity. Evidence  of partially recanalized, chronic, non-obstructive thrombus in the left great SV and its branches consistent with significant reflux consistent with post-phlebitic syndrome. Significant reflux of the left short saphenous vein.  . Cardiovascular stress test  05/29/2012    Mild-moderate perfusion defect due to infarct/scar with mild-moderate perinfarct ischemia seen in the mid anterior, apical anterior, apical septal, and apical regions. No ECG changes. Global LV systolic function is severely reduced.  . Transthoracic echocardiogram  04/18/2012    EF 45%, mild-moderate LVH  . Transthoracic echocardiogram  04/18/12    EF% 45%.SEVERE HYPOKINESIS TO AKINESIS OF THE MID-DISTAL INFEROLATERAL MYOCARDIUM AND MUCH OF THE APEX.  Marland Kitchen Lexiscam myocardial perfusion  05/29/12    MARKED PERFUSION DEFECT DUE TO INFARC/SCAR WITH MILD PERINFARCT ISCHEMIA IN THE BASAL INFERIOR, MID INFEROSEPTAL, MID INFERIOR AND APICALINFERIOR REGION. EF%33%. PERIINFARCT ISCHEMIA IN THE MID ANTERIOR, APICAL ANTERIOR, APICAL SEPTAL AND APICAL REGIONS.  . Fracture surgery    . Cystoscopy w/ stone manipulation  X 1  . Basal cell carcinoma excision      "probably 1/2 dozen cut off face, left ear" (02/07/2015)  . Cardiac catheterization N/A 02/09/2015    Procedure: Left Heart Cath and Coronary Angiography;  Surgeon: Leonie Man, MD;  Location: [facility];  Service: Cardiovascular;  Laterality: N/A;  . Coronary artery bypass graft N/A 02/17/2015    Procedure: CORONARY ARTERY BYPASS GRAFTING (CABG), ON PUMP, TIMES FIVE, USING LEFT INTERNAL MAMMARY ARTERY, RIGHT GREATER SAPHENOUS VEIN HARVESTED ENDOSCOPICALLY;  Surgeon: [name], MD;  Location: [facility];  Service: Open Heart Surgery;  Laterality: N/A;  -LIMA to LAD -SVG to DIAGONAL - SEQ SVG to OM1 and PLB -SVG to PDA  . Tee without cardioversion N/A 02/17/2015    Procedure: TRANSESOPHAGEAL ECHOCARDIOGRAM (TEE);  Surgeon: [name], MD;  Location: [facility];  Service: Open  Heart Surgery;  Laterality: N/A;  . Open heart sx  02/17/15    Social History   Social History  . Marital Status: Married    Spouse Name: N/A  . Number of Children: N/A  . Years of Education: N/A   Occupational History  . Not on file.   Social History Main Topics  . Smoking status: Never Smoker   . Smokeless tobacco: Never Used  . Alcohol Use: No  . Drug Use: No  . Sexual Activity: Not Currently   Other Topics Concern  . Not on file   Social History Narrative     Filed Vitals:   10/27/15 1531  BP: 168/78  Pulse: 84  Height: 5\' 7"  (1.702 m)  Weight: 200 lb (90.719 kg)    PHYSICAL EXAM General: NAD HEENT: Normal. Neck: No JVD, no thyromegaly. Lungs: Clear to  auscultation bilaterally with normal respiratory effort. CV: Tachycardic, regular rhythm, normal S1/S2, no S3/S4, no murmur. No pretibial or periankle edema.  Abdomen: Soft, nontender, no distention.  Neurologic: Alert and oriented.  Psych: Normal affect. Skin: Normal. Musculoskeletal: No gross deformities.  ECG: Most recent ECG reviewed.      ASSESSMENT AND PLAN: 1. Chronic combined systolic and diastolic heart failure: On torsemide 20 mg bid. HR normalized with addition of Coreg 3.125 mg bid. Lexiscan Cardiolite indicates ischemic etiology and was deemed a high risk study. Has CKD stage III. We discussed coronary angiography and he would like to think about it.  2. CAD s/p 5-v CABG: On ASA and Lipitor. Lexiscan Cardiolite indicative of ischemic etiology for EF reduction. Continue Coreg. We discussed coronary angiography and he would like to think about it.  3. Pulmonary embolism: Continue Eliquis.  4. Essential HTN: Elevated. On hydralazine. Consider increasing Coreg to 6.25 mg bid if elevated at next visit.  5. Hyperlipidemia: Continue Lipitor.  6. Tachycardia: Resolved with Coreg.  Dispo: f/u 1 month.  Kate Sable, M.D., F.A.C.C.

## 2015-10-27 NOTE — Patient Instructions (Signed)
Continue all current medications. Follow up in  1 month  

## 2015-11-04 DIAGNOSIS — I10 Essential (primary) hypertension: Secondary | ICD-10-CM | POA: Diagnosis not present

## 2015-11-04 DIAGNOSIS — E119 Type 2 diabetes mellitus without complications: Secondary | ICD-10-CM | POA: Diagnosis not present

## 2015-11-04 DIAGNOSIS — E782 Mixed hyperlipidemia: Secondary | ICD-10-CM | POA: Diagnosis not present

## 2015-11-04 NOTE — Patient Instructions (Signed)
Your procedure is scheduled on: 11/12/2015  Report to [facility] at  940   AM.  Call this number if you have problems the morning of surgery: [phone]   Do not eat food or drink liquids :After Midnight.      Take these medicines the morning of surgery with A SIP OF WATER: coreg, catapress, voltaren, gabapentin,ativan, ultram. Take 1/2 of your usual insulin dose the night before your surgery. DO NOT take any medicine for your diabetes the morming of surgery.   Do not wear jewelry, make-up or nail polish.  Do not wear lotions, powders, or perfumes. You may wear deodorant.  Do not shave 48 hours prior to surgery.  Do not bring valuables to the hospital.  Contacts, dentures or bridgework may not be worn into surgery.  Leave suitcase in the car. After surgery it may be brought to your room.  For patients admitted to the hospital, checkout time is 11:00 AM the day of discharge.   Patients discharged the day of surgery will not be allowed to drive home.  :     Please read over the following fact sheets that you were given: Coughing and Deep Breathing, Surgical Site Infection Prevention, Anesthesia Post-op Instructions and Care and Recovery After Surgery    Cataract A cataract is a clouding of the lens of the eye. When a lens becomes cloudy, vision is reduced based on the degree and nature of the clouding. Many cataracts reduce vision to some degree. Some cataracts make people more near-sighted as they develop. Other cataracts increase glare. Cataracts that are ignored and become worse can sometimes look white. The white color can be seen through the pupil. CAUSES   Aging. However, cataracts may occur at any age, even in newborns.   Certain drugs.   Trauma to the eye.   Certain diseases such as diabetes.   Specific eye diseases such as chronic inflammation inside the eye or a sudden attack of a rare form of glaucoma.   Inherited or acquired medical problems.  SYMPTOMS   Gradual,  progressive drop in vision in the affected eye.   Severe, rapid visual loss. This most often happens when trauma is the cause.  DIAGNOSIS  To detect a cataract, an eye doctor examines the lens. Cataracts are best diagnosed with an exam of the eyes with the pupils enlarged (dilated) by drops.  TREATMENT  For an early cataract, vision may improve by using different eyeglasses or stronger lighting. If that does not help your vision, surgery is the only effective treatment. A cataract needs to be surgically removed when vision loss interferes with your everyday activities, such as driving, reading, or watching TV. A cataract may also have to be removed if it prevents examination or treatment of another eye problem. Surgery removes the cloudy lens and usually replaces it with a substitute lens (intraocular lens, IOL).  At a time when both you and your doctor agree, the cataract will be surgically removed. If you have cataracts in both eyes, only one is usually removed at a time. This allows the operated eye to heal and be out of danger from any possible problems after surgery (such as infection or poor wound healing). In rare cases, a cataract may be doing damage to your eye. In these cases, your caregiver may advise surgical removal right away. The vast majority of people who have cataract surgery have better vision afterward. HOME CARE INSTRUCTIONS  If you are not planning surgery, you may  be asked to do the following:  Use different eyeglasses.   Use stronger or brighter lighting.   Ask your eye doctor about reducing your medicine dose or changing medicines if it is thought that a medicine caused your cataract. Changing medicines does not make the cataract go away on its own.   Become familiar with your surroundings. Poor vision can lead to injury. Avoid bumping into things on the affected side. You are at a higher risk for tripping or falling.   Exercise extreme care when driving or operating  machinery.   Wear sunglasses if you are sensitive to bright light or experiencing problems with glare.  SEEK IMMEDIATE MEDICAL CARE IF:   You have a worsening or sudden vision loss.   You notice redness, swelling, or increasing pain in the eye.   You have a fever.  Document Released: 08/08/2005 Document Revised: 07/28/2011 Document Reviewed: 04/01/2011 [facility] Patient Information 2012 [place].PATIENT INSTRUCTIONS POST-ANESTHESIA  IMMEDIATELY FOLLOWING SURGERY:  Do not drive or operate machinery for the first twenty four hours after surgery.  Do not make any important decisions for twenty four hours after surgery or while taking narcotic pain medications or sedatives.  If you develop intractable nausea and vomiting or a severe headache please notify your doctor immediately.  FOLLOW-UP:  Please make an appointment with your surgeon as instructed. You do not need to follow up with anesthesia unless specifically instructed to do so.  WOUND CARE INSTRUCTIONS (if applicable):  Keep a dry clean dressing on the anesthesia/puncture wound site if there is drainage.  Once the wound has quit draining you may leave it open to air.  Generally you should leave the bandage intact for twenty four hours unless there is drainage.  If the epidural site drains for more than 36-48 hours please call the anesthesia department.  QUESTIONS?:  Please feel free to call your physician or the hospital operator if you have any questions, and they will be happy to assist you.

## 2015-11-06 ENCOUNTER — Encounter (HOSPITAL_COMMUNITY)
Admission: RE | Admit: 2015-11-06 | Discharge: 2015-11-06 | Disposition: A | Discharge status: Home / Self Care | Payer: Medicare Other | Source: Ambulatory Visit | Attending: Ophthalmology | Admitting: Ophthalmology

## 2015-11-06 ENCOUNTER — Encounter (HOSPITAL_COMMUNITY): Payer: Self-pay

## 2015-11-06 ENCOUNTER — Other Ambulatory Visit: Payer: Self-pay

## 2015-11-06 DIAGNOSIS — Z01810 Encounter for preprocedural cardiovascular examination: Secondary | ICD-10-CM | POA: Diagnosis not present

## 2015-11-06 DIAGNOSIS — Z01812 Encounter for preprocedural laboratory examination: Secondary | ICD-10-CM | POA: Insufficient documentation

## 2015-11-09 DIAGNOSIS — N184 Chronic kidney disease, stage 4 (severe): Secondary | ICD-10-CM | POA: Diagnosis not present

## 2015-11-09 DIAGNOSIS — R944 Abnormal results of kidney function studies: Secondary | ICD-10-CM | POA: Diagnosis not present

## 2015-11-09 DIAGNOSIS — I10 Essential (primary) hypertension: Secondary | ICD-10-CM | POA: Diagnosis not present

## 2015-11-09 DIAGNOSIS — I776 Arteritis, unspecified: Secondary | ICD-10-CM | POA: Diagnosis not present

## 2015-11-09 DIAGNOSIS — I2510 Atherosclerotic heart disease of native coronary artery without angina pectoris: Secondary | ICD-10-CM | POA: Diagnosis not present

## 2015-11-09 DIAGNOSIS — L959 Vasculitis limited to the skin, unspecified: Secondary | ICD-10-CM | POA: Diagnosis not present

## 2015-11-09 DIAGNOSIS — L239 Allergic contact dermatitis, unspecified cause: Secondary | ICD-10-CM | POA: Diagnosis not present

## 2015-11-09 NOTE — Pre-Procedure Instructions (Signed)
Dr Patsey Berthold aware of potassium of 5.6. He wants lab drawn on arrival for glucose and potassium, not IStat.Marland Kitchen

## 2015-11-12 ENCOUNTER — Ambulatory Visit (HOSPITAL_COMMUNITY)
Admission: RE | Admit: 2015-11-12 | Discharge: 2015-11-12 | Disposition: A | Discharge status: Home / Self Care | Payer: Medicare Other | Source: Ambulatory Visit | Attending: Ophthalmology | Admitting: Ophthalmology

## 2015-11-12 ENCOUNTER — Encounter (HOSPITAL_COMMUNITY): Payer: Self-pay | Admitting: *Deleted

## 2015-11-12 ENCOUNTER — Ambulatory Visit (HOSPITAL_COMMUNITY): Payer: Medicare Other | Admitting: Anesthesiology

## 2015-11-12 ENCOUNTER — Encounter (HOSPITAL_COMMUNITY)
Admission: RE | Disposition: A | Discharge status: Home / Self Care | Payer: Self-pay | Source: Ambulatory Visit | Attending: Ophthalmology

## 2015-11-12 DIAGNOSIS — M1991 Primary osteoarthritis, unspecified site: Secondary | ICD-10-CM | POA: Diagnosis not present

## 2015-11-12 DIAGNOSIS — Z79899 Other long term (current) drug therapy: Secondary | ICD-10-CM | POA: Diagnosis not present

## 2015-11-12 DIAGNOSIS — Z96649 Presence of unspecified artificial hip joint: Secondary | ICD-10-CM | POA: Insufficient documentation

## 2015-11-12 DIAGNOSIS — I509 Heart failure, unspecified: Secondary | ICD-10-CM | POA: Diagnosis not present

## 2015-11-12 DIAGNOSIS — H269 Unspecified cataract: Secondary | ICD-10-CM | POA: Diagnosis not present

## 2015-11-12 DIAGNOSIS — Z96659 Presence of unspecified artificial knee joint: Secondary | ICD-10-CM | POA: Diagnosis not present

## 2015-11-12 DIAGNOSIS — Z794 Long term (current) use of insulin: Secondary | ICD-10-CM | POA: Insufficient documentation

## 2015-11-12 DIAGNOSIS — Z7901 Long term (current) use of anticoagulants: Secondary | ICD-10-CM | POA: Diagnosis not present

## 2015-11-12 DIAGNOSIS — N189 Chronic kidney disease, unspecified: Secondary | ICD-10-CM | POA: Insufficient documentation

## 2015-11-12 DIAGNOSIS — H25812 Combined forms of age-related cataract, left eye: Secondary | ICD-10-CM | POA: Insufficient documentation

## 2015-11-12 DIAGNOSIS — Z7982 Long term (current) use of aspirin: Secondary | ICD-10-CM | POA: Diagnosis not present

## 2015-11-12 DIAGNOSIS — I130 Hypertensive heart and chronic kidney disease with heart failure and stage 1 through stage 4 chronic kidney disease, or unspecified chronic kidney disease: Secondary | ICD-10-CM | POA: Diagnosis not present

## 2015-11-12 DIAGNOSIS — E1122 Type 2 diabetes mellitus with diabetic chronic kidney disease: Secondary | ICD-10-CM | POA: Diagnosis not present

## 2015-11-12 HISTORY — PX: CATARACT EXTRACTION W/PHACO: SHX586

## 2015-11-12 LAB — BASIC METABOLIC PANEL
ANION GAP: 7 (ref 5–15)
BUN: 32 mg/dL — ABNORMAL HIGH (ref 6–20)
CALCIUM: 9.5 mg/dL (ref 8.9–10.3)
CHLORIDE: 106 mmol/L (ref 101–111)
CO2: 26 mmol/L (ref 22–32)
Creatinine, Ser: 1.52 mg/dL — ABNORMAL HIGH (ref 0.61–1.24)
GFR calc non Af Amer: 41 mL/min — ABNORMAL LOW (ref >60)
GFR, EST AFRICAN AMERICAN: 48 mL/min — AB (ref >60)
GLUCOSE: 127 mg/dL — AB (ref 65–99)
POTASSIUM: 4.8 mmol/L (ref 3.5–5.1)
Sodium: 139 mmol/L (ref 135–145)

## 2015-11-12 SURGERY — PHACOEMULSIFICATION, CATARACT, WITH IOL INSERTION
Anesthesia: Monitor Anesthesia Care | Site: Eye | Laterality: Left

## 2015-11-12 MED ORDER — PROVISC 10 MG/ML IO SOLN
INTRAOCULAR | Status: DC | PRN
  Administered 2015-11-12: 0.85 mL via INTRAOCULAR

## 2015-11-12 MED ORDER — CYCLOPENTOLATE-PHENYLEPHRINE 0.2-1 % OP SOLN
1.0000 [drp] | OPHTHALMIC | Status: AC
  Administered 2015-11-12 (×3): 1 [drp] via OPHTHALMIC

## 2015-11-12 MED ORDER — EPINEPHRINE HCL 1 MG/ML IJ SOLN
INTRAMUSCULAR | Status: AC
  Filled 2015-11-12: qty 1

## 2015-11-12 MED ORDER — LIDOCAINE 3.5 % OP GEL OPTIME - NO CHARGE
OPHTHALMIC | Status: DC | PRN
  Administered 2015-11-12: 1 [drp] via OPHTHALMIC

## 2015-11-12 MED ORDER — PHENYLEPHRINE HCL 2.5 % OP SOLN
1.0000 [drp] | OPHTHALMIC | Status: AC
  Administered 2015-11-12 (×3): 1 [drp] via OPHTHALMIC

## 2015-11-12 MED ORDER — LIDOCAINE HCL (PF) 1 % IJ SOLN
INTRAMUSCULAR | Status: DC | PRN
  Administered 2015-11-12: .5 mL

## 2015-11-12 MED ORDER — MIDAZOLAM HCL 2 MG/2ML IJ SOLN
1.0000 mg | INTRAMUSCULAR | Status: DC | PRN
  Administered 2015-11-12: 2 mg via INTRAVENOUS

## 2015-11-12 MED ORDER — LIDOCAINE HCL 3.5 % OP GEL
1.0000 "application " | Freq: Once | OPHTHALMIC | Status: AC
  Administered 2015-11-12: 1 via OPHTHALMIC

## 2015-11-12 MED ORDER — BSS IO SOLN
INTRAOCULAR | Status: DC | PRN
  Administered 2015-11-12: 15 mL via INTRAOCULAR

## 2015-11-12 MED ORDER — EPINEPHRINE HCL 1 MG/ML IJ SOLN
INTRAOCULAR | Status: DC | PRN
  Administered 2015-11-12: 500 mL

## 2015-11-12 MED ORDER — FENTANYL CITRATE (PF) 100 MCG/2ML IJ SOLN
25.0000 ug | INTRAMUSCULAR | Status: AC
  Administered 2015-11-12 (×2): 25 ug via INTRAVENOUS

## 2015-11-12 MED ORDER — NEOMYCIN-POLYMYXIN-DEXAMETH 3.5-10000-0.1 OP SUSP
OPHTHALMIC | Status: DC | PRN
  Administered 2015-11-12: 2 [drp] via OPHTHALMIC

## 2015-11-12 MED ORDER — TETRACAINE HCL 0.5 % OP SOLN
1.0000 [drp] | OPHTHALMIC | Status: AC
  Administered 2015-11-12 (×3): 1 [drp] via OPHTHALMIC

## 2015-11-12 MED ORDER — POVIDONE-IODINE 5 % OP SOLN
OPHTHALMIC | Status: DC | PRN
  Administered 2015-11-12: 1 via OPHTHALMIC

## 2015-11-12 MED ORDER — LACTATED RINGERS IV SOLN
INTRAVENOUS | Status: DC
  Administered 2015-11-12: 09:00:00 via INTRAVENOUS

## 2015-11-12 MED ORDER — FENTANYL CITRATE (PF) 100 MCG/2ML IJ SOLN
INTRAMUSCULAR | Status: AC
  Filled 2015-11-12: qty 2

## 2015-11-12 MED ORDER — MIDAZOLAM HCL 2 MG/2ML IJ SOLN
INTRAMUSCULAR | Status: AC
  Filled 2015-11-12: qty 2

## 2015-11-12 SURGICAL SUPPLY — 13 items
CLOTH BEACON ORANGE TIMEOUT ST (SAFETY) ×2 IMPLANT
EYE SHIELD UNIVERSAL CLEAR (GAUZE/BANDAGES/DRESSINGS) ×2 IMPLANT
GLOVE BIOGEL PI IND STRL 7.0 (GLOVE) ×2 IMPLANT
GLOVE BIOGEL PI IND STRL 7.5 (GLOVE) ×1 IMPLANT
GLOVE BIOGEL PI INDICATOR 7.0 (GLOVE) ×2
GLOVE BIOGEL PI INDICATOR 7.5 (GLOVE) ×1
GLOVE EXAM NITRILE MD LF STRL (GLOVE) ×2 IMPLANT
PAD ARMBOARD 7.5X6 YLW CONV (MISCELLANEOUS) ×2 IMPLANT
SIGHTPATH CAT PROC W REG LENS (Ophthalmic Related) ×2 IMPLANT
SYRINGE LUER LOK 1CC (MISCELLANEOUS) ×2 IMPLANT
TAPE SURG TRANSPORE 1 IN (GAUZE/BANDAGES/DRESSINGS) ×1 IMPLANT
TAPE SURGICAL TRANSPORE 1 IN (GAUZE/BANDAGES/DRESSINGS) ×1
WATER STERILE IRR 250ML POUR (IV SOLUTION) ×2 IMPLANT

## 2015-11-12 NOTE — Transfer of Care (Signed)
Immediate Anesthesia Transfer of Care Note  Patient: Zachary Mcmahon  Procedure(s) Performed: Procedure(s) with comments: CATARACT EXTRACTION PHACO AND INTRAOCULAR LENS PLACEMENT LEFT EYE (Left) - CDE: 9.82  Patient Location: Short Stay  Anesthesia Type:MAC  Level of Consciousness: awake  Airway & Oxygen Therapy: Patient Spontanous Breathing  Post-op Assessment: Report given to RN  Post vital signs: Reviewed  Last Vitals:  Filed Vitals:   11/12/15 0935 11/12/15 0940  BP: 148/72 142/69  Pulse:    Temp:    Resp: 12 14    Complications: No apparent anesthesia complications

## 2015-11-12 NOTE — Op Note (Signed)
Date of Admission: 11/12/2015  Date of Surgery: 11/12/2015   Pre-Op Dx: Cataract Left Eye  Post-Op Dx: Senile Combined Cataract Left  Eye,  Dx Code [id number]  Surgeon: Tonny Branch, M.D.  Assistants: None  Anesthesia: Topical with MAC  Indications: Painless, progressive loss of vision with compromise of daily activities.  Surgery: Cataract Extraction with Intraocular lens Implant Left Eye  Discription: The patient had dilating drops and viscous lidocaine placed into the Left eye in the pre-op holding area. After transfer to the operating room, a time out was performed. The patient was then prepped and draped. Beginning with a 51 degree blade a paracentesis port was made at the surgeon's 2 o'clock position. The anterior chamber was then filled with 1% non-preserved lidocaine. This was followed by filling the anterior chamber with Provisc.  A 2.80mm keratome blade was used to make a clear corneal incision at the temporal limbus.  A bent cystatome needle was used to create a continuous tear capsulotomy. Hydrodissection was performed with balanced salt solution on a Fine canula. The lens nucleus was then removed using the phacoemulsification handpiece. Residual cortex was removed with the I&A handpiece. The anterior chamber and capsular bag were refilled with Provisc. A posterior chamber intraocular lens was placed into the capsular bag with it's injector. The implant was positioned with the Kuglan hook. The Provisc was then removed from the anterior chamber and capsular bag with the I&A handpiece. Stromal hydration of the main incision and paracentesis port was performed with BSS on a Fine canula. The wounds were tested for leak which was negative. The patient tolerated the procedure well. There were no operative complications. The patient was then transferred to the recovery room in stable condition.  Complications: None  Specimen: None  EBL: None  Prosthetic device: Hoya iSert 250, power 20.5 D, SN  [id number].

## 2015-11-12 NOTE — H&P (Signed)
I have reviewed the H&P, the patient was re-examined, and I have identified no interval changes in medical condition and plan of care since the history and physical of record  

## 2015-11-12 NOTE — Anesthesia Postprocedure Evaluation (Signed)
Anesthesia Post Note  Patient: Zachary Mcmahon  Procedure(s) Performed: Procedure(s) (LRB): CATARACT EXTRACTION PHACO AND INTRAOCULAR LENS PLACEMENT LEFT EYE (Left)  Patient location during evaluation: Short Stay Anesthesia Type: MAC Level of consciousness: awake and alert Pain management: pain level controlled Vital Signs Assessment: post-procedure vital signs reviewed and stable Respiratory status: spontaneous breathing Cardiovascular status: blood pressure returned to baseline Postop Assessment: no signs of nausea or vomiting Anesthetic complications: no    Last Vitals:  Filed Vitals:   11/12/15 0935 11/12/15 0940  BP: 148/72 142/69  Pulse:    Temp:    Resp: 12 14    Last Pain: There were no vitals filed for this visit.               Siobhan Zaro

## 2015-11-12 NOTE — Discharge Instructions (Signed)
Cataract Surgery, Care After °Refer to this sheet in the next few weeks. These instructions provide you with information on caring for yourself after your procedure. Your caregiver may also give you more specific instructions. Your treatment has been planned according to current medical practices, but problems sometimes occur. Call your caregiver if you have any problems or questions after your procedure.  °HOME CARE INSTRUCTIONS  °· Avoid strenuous activities as directed by your caregiver. °· Ask your caregiver when you can resume driving. °· Use eyedrops or other medicines to help healing and control pressure inside your eye as directed by your caregiver. °· Only take over-the-counter or prescription medicines for pain, discomfort, or fever as directed by your caregiver. °· Do not to touch or rub your eyes. °· You may be instructed to use a protective shield during the first few days and nights after surgery. If not, wear sunglasses to protect your eyes. This is to protect the eye from pressure or from being accidentally bumped. °· Keep the area around your eye clean and dry. Avoid swimming or allowing water to hit you directly in the face while showering. Keep soap and shampoo out of your eyes. °· Do not bend or lift heavy objects. Bending increases pressure in the eye. You can walk, climb stairs, and do light household chores. °· Do not put a contact lens into the eye that had surgery until your caregiver says it is okay to do so. °· Ask your doctor when you can return to work. This will depend on the kind of work that you do. If you work in a dusty environment, you may be advised to wear protective eyewear for a period of time. °· Ask your caregiver when it will be safe to engage in sexual activity. °· Continue with your regular eye exams as directed by your caregiver. °What to expect: °· It is normal to feel itching and mild discomfort for a few days after cataract surgery. Some fluid discharge is also common,  and your eye may be sensitive to light and touch. °· After 1 to 2 days, even moderate discomfort should disappear. In most cases, healing will take about 6 weeks. °· If you received an intraocular lens (IOL), you may notice that colors are very bright or have a blue tinge. Also, if you have been in bright sunlight, everything may appear reddish for a few hours. If you see these color tinges, it is because your lens is clear and no longer cloudy. Within a few months after receiving an IOL, these extra colors should go away. When you have healed, you will probably need new glasses. °SEEK MEDICAL CARE IF:  °· You have increased bruising around your eye. °· You have discomfort not helped by medicine. °SEEK IMMEDIATE MEDICAL CARE IF:  °· You have a  fever. °· You have a worsening or sudden vision loss. °· You have redness, swelling, or increasing pain in the eye. °· You have a thick discharge from the eye that had surgery. °MAKE SURE YOU: °· Understand these instructions. °· Will watch your condition. °· Will get help right away if you are not doing well or get worse. °  °This information is not intended to replace advice given to you by your health care provider. Make sure you discuss any questions you have with your health care provider. °  °Document Released: 02/25/2005 Document Revised: 08/29/2014 Document Reviewed: 04/01/2011 °Elsevier Interactive Patient Education ©2016 [employer] ° °

## 2015-11-12 NOTE — Anesthesia Preprocedure Evaluation (Signed)
Anesthesia Evaluation  Patient identified by MRN, date of birth, ID band Patient awake    Reviewed: Allergy & Precautions, H&P , NPO status , Patient's Chart, lab work & pertinent test results, reviewed documented beta blocker date and time   History of Anesthesia Complications Negative for: history of anesthetic complications  Airway Mallampati: II  TM Distance: >3 FB Neck ROM: Full    Dental no notable dental hx. (+) Teeth Intact, Missing   Pulmonary pneumonia,    Pulmonary exam normal breath sounds clear to auscultation       Cardiovascular hypertension, Pt. on home beta blockers and Pt. on medications + CAD, + Past MI, + Peripheral Vascular Disease and +CHF  Normal cardiovascular exam Rhythm:Regular Rate:Normal       Neuro/Psych    GI/Hepatic GERD  Controlled,  Endo/Other  diabetes, Well Controlled, Type 2, Insulin Dependent  Renal/GU Renal InsufficiencyRenal disease     Musculoskeletal  (+) Arthritis ,   Abdominal   Peds  Hematology   Anesthesia Other Findings   Reproductive/Obstetrics                             Anesthesia Physical Anesthesia Plan  ASA: III  Anesthesia Plan: MAC   Post-op Pain Management:    Induction: Intravenous  Airway Management Planned: Nasal Cannula  Additional Equipment:   Intra-op Plan:   Post-operative Plan:   Informed Consent: I have reviewed the patients History and Physical, chart, labs and discussed the procedure including the risks, benefits and alternatives for the proposed anesthesia with the patient or authorized representative who has indicated his/her understanding and acceptance.     Plan Discussed with:   Anesthesia Plan Comments:         Anesthesia Quick Evaluation

## 2015-11-13 ENCOUNTER — Encounter (HOSPITAL_COMMUNITY): Payer: Self-pay | Admitting: Ophthalmology

## 2015-11-19 DIAGNOSIS — Z961 Presence of intraocular lens: Secondary | ICD-10-CM | POA: Diagnosis not present

## 2015-11-19 DIAGNOSIS — H5201 Hypermetropia, right eye: Secondary | ICD-10-CM | POA: Diagnosis not present

## 2015-11-19 DIAGNOSIS — H25811 Combined forms of age-related cataract, right eye: Secondary | ICD-10-CM | POA: Diagnosis not present

## 2015-11-19 DIAGNOSIS — H5231 Anisometropia: Secondary | ICD-10-CM | POA: Diagnosis not present

## 2015-11-26 ENCOUNTER — Encounter: Payer: Self-pay | Admitting: Cardiovascular Disease

## 2015-11-26 ENCOUNTER — Ambulatory Visit (INDEPENDENT_AMBULATORY_CARE_PROVIDER_SITE_OTHER): Payer: Medicare Other | Admitting: Cardiovascular Disease

## 2015-11-26 ENCOUNTER — Ambulatory Visit: Payer: Medicare Other | Admitting: *Deleted

## 2015-11-26 VITALS — BP 156/91 | HR 72 | Ht 71.0 in | Wt 202.0 lb

## 2015-11-26 DIAGNOSIS — I5042 Chronic combined systolic (congestive) and diastolic (congestive) heart failure: Secondary | ICD-10-CM | POA: Diagnosis not present

## 2015-11-26 DIAGNOSIS — I25812 Atherosclerosis of bypass graft of coronary artery of transplanted heart without angina pectoris: Secondary | ICD-10-CM

## 2015-11-26 DIAGNOSIS — R931 Abnormal findings on diagnostic imaging of heart and coronary circulation: Secondary | ICD-10-CM | POA: Diagnosis not present

## 2015-11-26 DIAGNOSIS — I2782 Chronic pulmonary embolism: Secondary | ICD-10-CM

## 2015-11-26 DIAGNOSIS — Z951 Presence of aortocoronary bypass graft: Secondary | ICD-10-CM | POA: Diagnosis not present

## 2015-11-26 DIAGNOSIS — I429 Cardiomyopathy, unspecified: Secondary | ICD-10-CM

## 2015-11-26 DIAGNOSIS — R9439 Abnormal result of other cardiovascular function study: Secondary | ICD-10-CM

## 2015-11-26 DIAGNOSIS — N183 Chronic kidney disease, stage 3 (moderate): Secondary | ICD-10-CM

## 2015-11-26 DIAGNOSIS — I10 Essential (primary) hypertension: Secondary | ICD-10-CM

## 2015-11-26 DIAGNOSIS — R000 Tachycardia, unspecified: Secondary | ICD-10-CM

## 2015-11-26 MED ORDER — HYDRALAZINE HCL 50 MG PO TABS: 50.0000 mg | ORAL_TABLET | Freq: Three times a day (TID) | ORAL | Status: DC

## 2015-11-26 NOTE — Progress Notes (Signed)
Pt was started on Eliquis 5mg  bid for Pulmonary embolus while in hospital in July 2016. Dose was decreased to 2.5mg  bid at OV 04/10/15 with Dr Bronson Ing due to age and renal function.  Pt denies problems taking Eliquis. He has not had any bleeding, excessive bruising or GI upset.   Reviewed patients medication list. Pt is not currently on any combined P-gp and strong CYP3A4 inhibitors/inducers (ketoconazole, traconazole, ritonavir, carbamazepine, phenytoin, rifampin, St. John's wort). Reviewed labs from [facility] 11/12/15. SCr 1.52, Weight 202.6, CrCl 49.40 SCr is slightly elevated at 1.52 but much improved from 12/16. Will leave pt on 2.5mg  bid for 3 more months and re-evaluate SrCr at that time.. Dose is appropriate based on 2 out of 3 criteria (age,wt,SrCr). Hgb and HCT 10/04/15: 10.8/34.7   A full discussion of the nature of anticoagulants has been carried out. A benefit/risk analysis has been presented to the patient, so that they understand the justification for choosing anticoagulation with Eliquis at this time. The need for compliance is stressed. Pt is aware to take the medication twice daily. Side effects of potential bleeding are discussed, including unusual colored urine or stools, coughing up blood or coffee ground emesis, nose bleeds or serious fall or head trauma. Discussed signs and symptoms of stroke. The patient should avoid any OTC items containing aspirin or ibuprofen. Avoid alcohol consumption. Call if any signs of abnormal bleeding. Discussed financial obligations and resolved any difficulty in obtaining medication..    Discussed lab results with pt.  Kidney function improved. Follow up in 3 months. Appt made.

## 2015-11-26 NOTE — Progress Notes (Signed)
Patient ID: Zachary Mcmahon, male   DOB: 28-Oct-1931, 82 y.o.   MRN: [id number]      SUBJECTIVE: The patient returns for follow-up of coronary artery disease and chronic systolic and diastolic heart failure. Nuclear stress test on 10/23/15 demonstrated a large defect involving the inferoseptal, inferior, and lateral wall extending from the apex to the base with some degree of reversibility suggestive of moderate lateral ischemia. It was deemed a high risk study.  Echocardiogram performed on 10/04/15 demonstrated mild to moderately reduced left ventricular systolic function, EF [id number], with inferior wall hypokinesis, grade 2 diastolic dysfunction , and mild to moderate mitral regurgitation.   He underwent 5 vessel coronary artery bypass graft surgery on 02/17/15. He has a history of pulmonary embolism and is anticoagulated with Eliquis. He also has chronic kidney disease stage III. The EF had dropped and had previously been normal , 50-55%, in July 2016.  He denies chest pain and shortness of breath. He recently underwent cataract surgery and plans to undergo another procedure, and wants to avoid coronary angiography at least until then.  His BP is elevated.   Review of Systems: As per "subjective", otherwise negative.  Allergies  Allergen Reactions  . Bactrim [Sulfamethoxazole-Trimethoprim] Rash    Current Outpatient Prescriptions  Medication Sig Dispense Refill  . apixaban (ELIQUIS) 2.5 MG TABS tablet Take 1 tablet (2.5 mg total) by mouth 2 (two) times daily. 28 tablet 0  . aspirin EC 81 MG tablet Take 81 mg by mouth every morning.    Marland Kitchen atorvastatin (LIPITOR) 20 MG tablet TAKE 1 TABLET BY MOUTH DAILY AT 6:00 P.M. ( MUST SCHEDULE AN APPOINTMENT FOR FUTURE REFILLS ) 30 tablet 0  . Bromfenac Sodium (PROLENSA) 0.07 % SOLN Apply 1 drop to eye daily.    . carvedilol (COREG) 3.125 MG tablet TAKE 1 TABLET BY MOUTH TWICE DAILY 60 tablet 3  . cloNIDine (CATAPRES) 0.2 MG tablet Take 0.2 mg by mouth 2  (two) times daily.    . diclofenac sodium (VOLTAREN) 1 % GEL Apply 2 g topically 2 (two) times daily.    . Difluprednate (DUREZOL) 0.05 % EMUL Apply 1 drop to eye daily.    Marland Kitchen ELIQUIS 2.5 MG TABS tablet TAKE ONE TABLET BY MOUTH TWICE DAILY 60 tablet 6  . ferrous sulfate 325 (65 FE) MG tablet Take 1 tablet (325 mg total) by mouth daily with breakfast.  3  . furosemide (LASIX) 40 MG tablet Take 40 mg by mouth daily.    Marland Kitchen gabapentin (NEURONTIN) 100 MG capsule Take 100 mg by mouth 3 (three) times daily.     . hydrALAZINE (APRESOLINE) 10 MG tablet Take 10 mg by mouth 3 (three) times daily.    . hydrALAZINE (APRESOLINE) 25 MG tablet Take 25 mg by mouth 3 (three) times daily.    Marland Kitchen LEVEMIR FLEXTOUCH 100 UNIT/ML Pen Inject 27 Units into the skin at bedtime. (Patient taking differently: Inject 32 Units into the skin at bedtime. )    . LORazepam (ATIVAN) 1 MG tablet Take 1 tablet (1 mg total) by mouth at bedtime as needed for sleep. (Patient taking differently: Take 1 mg by mouth at bedtime as needed for anxiety or sleep. ) 30 tablet 5  . losartan (COZAAR) 100 MG tablet Take 100 mg by mouth daily.    . metoprolol tartrate (LOPRESSOR) 25 MG tablet Take 25 mg by mouth 2 (two) times daily.    Marland Kitchen omeprazole (PRILOSEC) 20 MG capsule Take 20 mg by mouth daily.    Marland Kitchen  potassium chloride (K-DUR) 10 MEQ tablet Take 10 mEq by mouth daily.    Marland Kitchen torsemide (DEMADEX) 20 MG tablet Take 1 tablet (20 mg total) by mouth 2 (two) times daily. 60 tablet 0  . traMADol (ULTRAM) 50 MG tablet Take 1 tablet (50 mg total) by mouth every 6 (six) hours as needed for moderate pain. 120 tablet 5   No current facility-administered medications for this visit.    Past Medical History  Diagnosis Date  . Hypertension   . High potassium   . Varicose veins   . Hyperlipidemia   . Basal cell carcinoma of forehead 2016 X 2  . Basal cell carcinoma of left earlobe   . Old myocardial infarct     "sometime in the past; don't know when"  (02/07/2015)  . Type II diabetes mellitus ([place])   . Pneumonia X 1  . GERD (gastroesophageal reflux disease)   . Arthritis     "legs" (02/07/2015)  . History of gout X 1  . Diabetic peripheral neuropathy ([place])     "left foot" (02/07/2015)  . Respiratory failure ([place])   . Pain and swelling of left lower leg     chronic  . Renal insufficiency     Past Surgical History  Procedure Laterality Date  . Knee arthroscopy Right ~ 2008  . Orif hip fracture  03/20/2012    Procedure: OPEN REDUCTION INTERNAL FIXATION HIP;  Surgeon: Sanjuana Kava, MD;  Location: [facility];  Service: Orthopedics;  Laterality: Right;  . Lower extremity venous doppler  07/11/2012    No evidence of DVT in the left lower extremity. Evidence of partially recanalized, chronic, non-obstructive thrombus in the left great SV and its branches consistent with significant reflux consistent with post-phlebitic syndrome. Significant reflux of the left short saphenous vein.  . Cardiovascular stress test  05/29/2012    Mild-moderate perfusion defect due to infarct/scar with mild-moderate perinfarct ischemia seen in the mid anterior, apical anterior, apical septal, and apical regions. No ECG changes. Global LV systolic function is severely reduced.  . Transthoracic echocardiogram  04/18/2012    EF 45%, mild-moderate LVH  . Transthoracic echocardiogram  04/18/12    EF% 45%.SEVERE HYPOKINESIS TO AKINESIS OF THE MID-DISTAL INFEROLATERAL MYOCARDIUM AND MUCH OF THE APEX.  Marland Kitchen Lexiscam myocardial perfusion  05/29/12    MARKED PERFUSION DEFECT DUE TO INFARC/SCAR WITH MILD PERINFARCT ISCHEMIA IN THE BASAL INFERIOR, MID INFEROSEPTAL, MID INFERIOR AND APICALINFERIOR REGION. EF%33%. PERIINFARCT ISCHEMIA IN THE MID ANTERIOR, APICAL ANTERIOR, APICAL SEPTAL AND APICAL REGIONS.  . Fracture surgery    . Cystoscopy w/ stone manipulation  X 1  . Basal cell carcinoma excision      "probably 1/2 dozen cut off face, left ear" (02/07/2015)  . Cardiac catheterization  N/A 02/09/2015    Procedure: Left Heart Cath and Coronary Angiography;  Surgeon: Leonie Man, MD;  Location: [facility] [facility];  Service: Cardiovascular;  Laterality: N/A;  . Coronary artery bypass graft N/A 02/17/2015    Procedure: CORONARY ARTERY BYPASS GRAFTING (CABG), ON PUMP, TIMES FIVE, USING LEFT INTERNAL MAMMARY ARTERY, RIGHT GREATER SAPHENOUS VEIN HARVESTED ENDOSCOPICALLY;  Surgeon: [name], MD;  Location: [place];  Service: Open Heart Surgery;  Laterality: N/A;  -LIMA to LAD -SVG to DIAGONAL - SEQ SVG to OM1 and PLB -SVG to PDA  . Tee without cardioversion N/A 02/17/2015    Procedure: TRANSESOPHAGEAL ECHOCARDIOGRAM (TEE);  Surgeon: [name], MD;  Location: [place];  Service: Open Heart Surgery;  Laterality: N/A;  . Open heart sx  02/17/15  . Cataract extraction w/phaco Left 11/12/2015    Procedure: CATARACT EXTRACTION PHACO AND INTRAOCULAR LENS PLACEMENT LEFT EYE;  Surgeon: Tonny Branch, MD;  Location: [facility];  Service: Ophthalmology;  Laterality: Left;  CDE: 9.82    Social History   Social History  . Marital Status: Married    Spouse Name: N/A  . Number of Children: N/A  . Years of Education: N/A   Occupational History  . Not on file.   Social History Main Topics  . Smoking status: Never Smoker   . Smokeless tobacco: Never Used  . Alcohol Use: No  . Drug Use: No  . Sexual Activity: Not Currently   Other Topics Concern  . Not on file   Social History Narrative     Filed Vitals:   11/26/15 1500  BP: 156/91  Pulse: 72  Height: 5\' 11"  (1.803 m)  Weight: 202 lb (91.627 kg)  SpO2: 98%    PHYSICAL EXAM General: NAD HEENT: Normal. Neck: No JVD, no thyromegaly. Lungs: Clear to auscultation bilaterally with normal respiratory effort. CV: Tachycardic, regular rhythm, normal S1/S2, no S3/S4, no murmur. No pretibial or periankle edema.  Abdomen: Soft, nontender, no distention.  Neurologic: Alert and oriented.  Psych: Normal affect. Skin:  Normal. Musculoskeletal: No gross deformities.  ECG: Most recent ECG reviewed.      ASSESSMENT AND PLAN: 1. Chronic combined systolic and diastolic heart failure: On torsemide 20 mg bid. HR normalized with addition of Coreg 3.125 mg bid. Lexiscan Cardiolite indicates ischemic etiology and was deemed a high risk study. Has CKD stage III. We discussed coronary angiography again and he does not want to proceed at this time.  2. CAD s/p 5-v CABG: On ASA and Lipitor. Lexiscan Cardiolite indicative of ischemic etiology for EF reduction. Continue Coreg. We discussed coronary angiography again and he does not want to proceed at this time.  3. Pulmonary embolism: Continue Eliquis.  4. Essential HTN: Elevated. On hydralazine. Will increase to 50 mg TID.  5. Hyperlipidemia: Continue Lipitor.  6. Tachycardia: Resolved with Coreg.  Dispo: f/u 3 months.  Kate Sable, M.D., F.A.C.C.

## 2015-11-26 NOTE — Patient Instructions (Signed)
Medication Instructions:  INCREASE HYDRALAZINE TO 50 MG THREE TIMES DAILY  Labwork: NONE  Testing/Procedures: NONE  Follow-Up: Your physician recommends that you schedule a follow-up appointment in: 3 MONTHS    Any Other Special Instructions Will Be Listed Below (If Applicable).     If you need a refill on your cardiac medications before your next appointment, please call your pharmacy.

## 2015-12-01 ENCOUNTER — Telehealth: Payer: Self-pay | Admitting: *Deleted

## 2015-12-01 NOTE — Telephone Encounter (Signed)
Saw Dr. Bronson Ing on 11/26/2015.  Increased his Hydralazine to 50 three times per day.  Itching started the next day, all over.  Feels terrible, can't rest due to the itching.  Is using itch cream topically, but only helps for a short time.  No change in deodorant, perfumes, or detergents at home.  Stated he could tolerate the 25mg  without difficulty.  Advise patient to go back to the 25mg  till we get further advice from provider.  Patient appreciative of call & verbalized understanding.

## 2015-12-01 NOTE — Telephone Encounter (Signed)
Ok to keep hydralazine at 25 mg TID. Increase Coreg to 6.25 mg BID for more optimal BP control.

## 2015-12-02 MED ORDER — CARVEDILOL 6.25 MG PO TABS: 6.2500 mg | ORAL_TABLET | Freq: Two times a day (BID) | ORAL | Status: DC

## 2015-12-02 MED ORDER — HYDRALAZINE HCL 50 MG PO TABS: 25.0000 mg | ORAL_TABLET | Freq: Three times a day (TID) | ORAL | Status: DC

## 2015-12-02 NOTE — Telephone Encounter (Signed)
Patient informed and verbalized understanding of plan. 

## 2015-12-09 ENCOUNTER — Encounter (HOSPITAL_COMMUNITY)
Admission: RE | Admit: 2015-12-09 | Discharge: 2015-12-09 | Disposition: A | Discharge status: Home / Self Care | Payer: Medicare Other | Source: Ambulatory Visit | Attending: Ophthalmology | Admitting: Ophthalmology

## 2015-12-09 ENCOUNTER — Encounter (HOSPITAL_COMMUNITY): Payer: Self-pay

## 2015-12-09 DIAGNOSIS — R809 Proteinuria, unspecified: Secondary | ICD-10-CM | POA: Diagnosis not present

## 2015-12-09 DIAGNOSIS — E875 Hyperkalemia: Secondary | ICD-10-CM | POA: Diagnosis not present

## 2015-12-09 DIAGNOSIS — E119 Type 2 diabetes mellitus without complications: Secondary | ICD-10-CM | POA: Diagnosis not present

## 2015-12-09 DIAGNOSIS — I10 Essential (primary) hypertension: Secondary | ICD-10-CM | POA: Diagnosis not present

## 2015-12-14 ENCOUNTER — Encounter (HOSPITAL_COMMUNITY)
Admission: RE | Disposition: A | Discharge status: Home / Self Care | Payer: Self-pay | Source: Ambulatory Visit | Attending: Ophthalmology

## 2015-12-14 ENCOUNTER — Ambulatory Visit (HOSPITAL_COMMUNITY): Payer: Medicare Other | Admitting: Anesthesiology

## 2015-12-14 ENCOUNTER — Encounter (HOSPITAL_COMMUNITY): Payer: Self-pay | Admitting: *Deleted

## 2015-12-14 ENCOUNTER — Ambulatory Visit (HOSPITAL_COMMUNITY)
Admission: RE | Admit: 2015-12-14 | Discharge: 2015-12-14 | Disposition: A | Discharge status: Home / Self Care | Payer: Medicare Other | Source: Ambulatory Visit | Attending: Ophthalmology | Admitting: Ophthalmology

## 2015-12-14 DIAGNOSIS — Z96649 Presence of unspecified artificial hip joint: Secondary | ICD-10-CM | POA: Insufficient documentation

## 2015-12-14 DIAGNOSIS — Z7951 Long term (current) use of inhaled steroids: Secondary | ICD-10-CM | POA: Insufficient documentation

## 2015-12-14 DIAGNOSIS — Z79899 Other long term (current) drug therapy: Secondary | ICD-10-CM | POA: Diagnosis not present

## 2015-12-14 DIAGNOSIS — I110 Hypertensive heart disease with heart failure: Secondary | ICD-10-CM | POA: Diagnosis not present

## 2015-12-14 DIAGNOSIS — Z7982 Long term (current) use of aspirin: Secondary | ICD-10-CM | POA: Insufficient documentation

## 2015-12-14 DIAGNOSIS — Z7902 Long term (current) use of antithrombotics/antiplatelets: Secondary | ICD-10-CM | POA: Diagnosis not present

## 2015-12-14 DIAGNOSIS — I2510 Atherosclerotic heart disease of native coronary artery without angina pectoris: Secondary | ICD-10-CM | POA: Insufficient documentation

## 2015-12-14 DIAGNOSIS — K219 Gastro-esophageal reflux disease without esophagitis: Secondary | ICD-10-CM | POA: Insufficient documentation

## 2015-12-14 DIAGNOSIS — Z794 Long term (current) use of insulin: Secondary | ICD-10-CM | POA: Diagnosis not present

## 2015-12-14 DIAGNOSIS — E119 Type 2 diabetes mellitus without complications: Secondary | ICD-10-CM | POA: Diagnosis not present

## 2015-12-14 DIAGNOSIS — H269 Unspecified cataract: Secondary | ICD-10-CM | POA: Diagnosis not present

## 2015-12-14 DIAGNOSIS — Z791 Long term (current) use of non-steroidal anti-inflammatories (NSAID): Secondary | ICD-10-CM | POA: Diagnosis not present

## 2015-12-14 DIAGNOSIS — Z96659 Presence of unspecified artificial knee joint: Secondary | ICD-10-CM | POA: Diagnosis not present

## 2015-12-14 DIAGNOSIS — Z951 Presence of aortocoronary bypass graft: Secondary | ICD-10-CM | POA: Insufficient documentation

## 2015-12-14 DIAGNOSIS — H25811 Combined forms of age-related cataract, right eye: Secondary | ICD-10-CM | POA: Insufficient documentation

## 2015-12-14 DIAGNOSIS — M1991 Primary osteoarthritis, unspecified site: Secondary | ICD-10-CM | POA: Insufficient documentation

## 2015-12-14 DIAGNOSIS — I509 Heart failure, unspecified: Secondary | ICD-10-CM | POA: Insufficient documentation

## 2015-12-14 HISTORY — PX: CATARACT EXTRACTION W/PHACO: SHX586

## 2015-12-14 LAB — GLUCOSE, CAPILLARY: Glucose-Capillary: 127 mg/dL — ABNORMAL HIGH (ref 65–99)

## 2015-12-14 SURGERY — PHACOEMULSIFICATION, CATARACT, WITH IOL INSERTION
Anesthesia: Monitor Anesthesia Care | Site: Eye | Laterality: Right

## 2015-12-14 MED ORDER — PHENYLEPHRINE HCL 2.5 % OP SOLN
1.0000 [drp] | OPHTHALMIC | Status: AC
  Administered 2015-12-14 (×3): 1 [drp] via OPHTHALMIC

## 2015-12-14 MED ORDER — LIDOCAINE HCL (PF) 1 % IJ SOLN
INTRAMUSCULAR | Status: DC | PRN
  Administered 2015-12-14: .7 mL

## 2015-12-14 MED ORDER — PROVISC 10 MG/ML IO SOLN
INTRAOCULAR | Status: DC | PRN
  Administered 2015-12-14: 0.85 mL via INTRAOCULAR

## 2015-12-14 MED ORDER — FENTANYL CITRATE (PF) 100 MCG/2ML IJ SOLN
25.0000 ug | INTRAMUSCULAR | Status: AC
  Administered 2015-12-14 (×2): 25 ug via INTRAVENOUS

## 2015-12-14 MED ORDER — LIDOCAINE 3.5 % OP GEL OPTIME - NO CHARGE
OPHTHALMIC | Status: DC | PRN
  Administered 2015-12-14: 1 [drp] via OPHTHALMIC

## 2015-12-14 MED ORDER — MIDAZOLAM HCL 2 MG/2ML IJ SOLN
INTRAMUSCULAR | Status: AC
  Filled 2015-12-14: qty 2

## 2015-12-14 MED ORDER — TETRACAINE HCL 0.5 % OP SOLN
1.0000 [drp] | OPHTHALMIC | Status: AC
  Administered 2015-12-14 (×3): 1 [drp] via OPHTHALMIC

## 2015-12-14 MED ORDER — LACTATED RINGERS IV SOLN
INTRAVENOUS | Status: DC
  Administered 2015-12-14: 13:00:00 via INTRAVENOUS

## 2015-12-14 MED ORDER — MIDAZOLAM HCL 2 MG/2ML IJ SOLN
1.0000 mg | INTRAMUSCULAR | Status: DC | PRN
  Administered 2015-12-14: 2 mg via INTRAVENOUS

## 2015-12-14 MED ORDER — CYCLOPENTOLATE-PHENYLEPHRINE 0.2-1 % OP SOLN
1.0000 [drp] | OPHTHALMIC | Status: AC
  Administered 2015-12-14 (×3): 1 [drp] via OPHTHALMIC

## 2015-12-14 MED ORDER — EPINEPHRINE HCL 1 MG/ML IJ SOLN
INTRAMUSCULAR | Status: DC | PRN
  Administered 2015-12-14: 500 mL

## 2015-12-14 MED ORDER — BSS IO SOLN
INTRAOCULAR | Status: DC | PRN
  Administered 2015-12-14: 15 mL

## 2015-12-14 MED ORDER — FENTANYL CITRATE (PF) 100 MCG/2ML IJ SOLN
INTRAMUSCULAR | Status: AC
  Filled 2015-12-14: qty 2

## 2015-12-14 MED ORDER — NEOMYCIN-POLYMYXIN-DEXAMETH 3.5-10000-0.1 OP SUSP
OPHTHALMIC | Status: DC | PRN
Start: 2015-12-14 — End: 2015-12-14
  Administered 2015-12-14: 2 [drp] via OPHTHALMIC

## 2015-12-14 MED ORDER — EPINEPHRINE HCL 1 MG/ML IJ SOLN
INTRAMUSCULAR | Status: AC
  Filled 2015-12-14: qty 1

## 2015-12-14 MED ORDER — POVIDONE-IODINE 5 % OP SOLN
OPHTHALMIC | Status: DC | PRN
  Administered 2015-12-14: 1 via OPHTHALMIC

## 2015-12-14 MED ORDER — LIDOCAINE HCL 3.5 % OP GEL
1.0000 "application " | Freq: Once | OPHTHALMIC | Status: AC
  Administered 2015-12-14: 1 via OPHTHALMIC

## 2015-12-14 SURGICAL SUPPLY — 11 items
CLOTH BEACON ORANGE TIMEOUT ST (SAFETY) ×2 IMPLANT
EYE SHIELD UNIVERSAL CLEAR (GAUZE/BANDAGES/DRESSINGS) ×2 IMPLANT
GLOVE BIOGEL PI IND STRL 6.5 (GLOVE) ×1 IMPLANT
GLOVE BIOGEL PI INDICATOR 6.5 (GLOVE) ×1
GLOVE EXAM NITRILE MD LF STRL (GLOVE) ×2 IMPLANT
PAD ARMBOARD 7.5X6 YLW CONV (MISCELLANEOUS) ×2 IMPLANT
SIGHTPATH CAT PROC W REG LENS (Ophthalmic Related) ×2 IMPLANT
SYRINGE LUER LOK 1CC (MISCELLANEOUS) ×2 IMPLANT
TAPE SURG TRANSPORE 1 IN (GAUZE/BANDAGES/DRESSINGS) ×1 IMPLANT
TAPE SURGICAL TRANSPORE 1 IN (GAUZE/BANDAGES/DRESSINGS) ×1
WATER STERILE IRR 250ML POUR (IV SOLUTION) ×2 IMPLANT

## 2015-12-14 NOTE — Transfer of Care (Signed)
Immediate Anesthesia Transfer of Care Note  Patient: Zachary Mcmahon  Procedure(s) Performed: Procedure(s) with comments: CATARACT EXTRACTION PHACO AND INTRAOCULAR LENS PLACEMENT (IOC) (Right) - CDE: 13.86  Patient Location: Short Stay  Anesthesia Type:MAC  Level of Consciousness: awake, alert , oriented and patient cooperative  Airway & Oxygen Therapy: Patient Spontanous Breathing  Post-op Assessment: Report given to RN and Post -op Vital signs reviewed and stable  Post vital signs: Reviewed and stable  Last Vitals:  Filed Vitals:   12/14/15 1315  BP: 195/93  Resp: 12    Complications: No apparent anesthesia complications

## 2015-12-14 NOTE — H&P (Signed)
I have reviewed the H&P, the patient was re-examined, and I have identified no interval changes in medical condition and plan of care since the history and physical of record  

## 2015-12-14 NOTE — Op Note (Signed)
Date of Admission: 12/14/2015  Date of Surgery: 12/14/2015   Pre-Op Dx: Cataract Right Eye  Post-Op Dx: Senile Combined Cataract Right  Eye,  Dx Code [id number]  Surgeon: Tonny Branch, M.D.  Assistants: None  Anesthesia: Topical with MAC  Indications: Painless, progressive loss of vision with compromise of daily activities.  Surgery: Cataract Extraction with Intraocular lens Implant Right Eye  Discription: The patient had dilating drops and viscous lidocaine placed into the Right eye in the pre-op holding area. After transfer to the operating room, a time out was performed. The patient was then prepped and draped. Beginning with a 64 degree blade a paracentesis port was made at the surgeon's 2 o'clock position. The anterior chamber was then filled with 1% non-preserved lidocaine. This was followed by filling the anterior chamber with Provisc.  A 2.93mm keratome blade was used to make a clear corneal incision at the temporal limbus.  A bent cystatome needle was used to create a continuous tear capsulotomy. Hydrodissection was performed with balanced salt solution on a Fine canula. The lens nucleus was then removed using the phacoemulsification handpiece. Residual cortex was removed with the I&A handpiece. The anterior chamber and capsular bag were refilled with Provisc. A posterior chamber intraocular lens was placed into the capsular bag with it's injector. The implant was positioned with the Kuglan hook. The Provisc was then removed from the anterior chamber and capsular bag with the I&A handpiece. Stromal hydration of the main incision and paracentesis port was performed with BSS on a Fine canula. The wounds were tested for leak which was negative. The patient tolerated the procedure well. There were no operative complications. The patient was then transferred to the recovery room in stable condition.  Complications: None  Specimen: None  EBL: None  Prosthetic device: Hoya iSert 250, power 20.5  D, SN [id number].

## 2015-12-14 NOTE — Anesthesia Postprocedure Evaluation (Signed)
Anesthesia Post Note  Patient: Zachary Mcmahon  Procedure(s) Performed: Procedure(s) (LRB): CATARACT EXTRACTION PHACO AND INTRAOCULAR LENS PLACEMENT (IOC) (Right)  Patient location during evaluation: Short Stay Anesthesia Type: MAC Level of consciousness: awake and alert and oriented Pain management: pain level controlled Vital Signs Assessment: post-procedure vital signs reviewed and stable Respiratory status: spontaneous breathing and respiratory function stable Cardiovascular status: stable Postop Assessment: no signs of nausea or vomiting Anesthetic complications: no    Last Vitals:  Filed Vitals:   12/14/15 1315  BP: 195/93  Resp: 12    Last Pain: There were no vitals filed for this visit.               Beauford Lando A

## 2015-12-14 NOTE — Anesthesia Preprocedure Evaluation (Addendum)
Anesthesia Evaluation  Patient identified by MRN, date of birth, ID band Patient awake    Reviewed: Allergy & Precautions, H&P , NPO status , Patient's Chart, lab work & pertinent test results, reviewed documented beta blocker date and time   History of Anesthesia Complications Negative for: history of anesthetic complications  Airway Mallampati: II  TM Distance: >3 FB Neck ROM: Full    Dental no notable dental hx. (+) Teeth Intact, Missing   Pulmonary pneumonia,    Pulmonary exam normal breath sounds clear to auscultation       Cardiovascular hypertension, Pt. on home beta blockers and Pt. on medications + CAD, + Past MI, + Peripheral Vascular Disease and +CHF  Normal cardiovascular exam Rhythm:Regular Rate:Normal       Neuro/Psych    GI/Hepatic GERD  Controlled,  Endo/Other  diabetes, Well Controlled, Type 2, Insulin Dependent  Renal/GU Renal InsufficiencyRenal disease     Musculoskeletal  (+) Arthritis ,   Abdominal   Peds  Hematology   Anesthesia Other Findings   Reproductive/Obstetrics                            Anesthesia Physical Anesthesia Plan  ASA: III  Anesthesia Plan: MAC   Post-op Pain Management:    Induction: Intravenous  Airway Management Planned: Nasal Cannula  Additional Equipment:   Intra-op Plan:   Post-operative Plan:   Informed Consent: I have reviewed the patients History and Physical, chart, labs and discussed the procedure including the risks, benefits and alternatives for the proposed anesthesia with the patient or authorized representative who has indicated his/her understanding and acceptance.     Plan Discussed with:   Anesthesia Plan Comments:         Anesthesia Quick Evaluation

## 2015-12-14 NOTE — Anesthesia Preprocedure Evaluation (Deleted)

## 2015-12-14 NOTE — Anesthesia Procedure Notes (Signed)
Procedure Name: MAC Date/Time: 12/14/2015 1:51 PM Performed by: Andree Elk, Stefana Lodico A Pre-anesthesia Checklist: Patient identified, Timeout performed, Emergency Drugs available, Suction available and Patient being monitored Oxygen Delivery Method: Nasal cannula

## 2015-12-14 NOTE — Discharge Instructions (Signed)

## 2015-12-15 ENCOUNTER — Encounter (HOSPITAL_COMMUNITY): Payer: Self-pay | Admitting: Ophthalmology

## 2015-12-26 DIAGNOSIS — L0390 Cellulitis, unspecified: Secondary | ICD-10-CM | POA: Diagnosis not present

## 2015-12-26 DIAGNOSIS — L03019 Cellulitis of unspecified finger: Secondary | ICD-10-CM | POA: Diagnosis not present

## 2015-12-30 DIAGNOSIS — L0390 Cellulitis, unspecified: Secondary | ICD-10-CM | POA: Diagnosis not present

## 2016-01-08 ENCOUNTER — Encounter (HOSPITAL_COMMUNITY): Payer: Self-pay | Admitting: *Deleted

## 2016-01-08 ENCOUNTER — Emergency Department (HOSPITAL_COMMUNITY): Payer: Medicare Other

## 2016-01-08 ENCOUNTER — Emergency Department (HOSPITAL_COMMUNITY)
Admission: EM | Admit: 2016-01-08 | Discharge: 2016-01-08 | Disposition: A | Discharge status: Home / Self Care | Payer: Medicare Other | Attending: Emergency Medicine | Admitting: Emergency Medicine

## 2016-01-08 DIAGNOSIS — I252 Old myocardial infarction: Secondary | ICD-10-CM | POA: Insufficient documentation

## 2016-01-08 DIAGNOSIS — Z794 Long term (current) use of insulin: Secondary | ICD-10-CM | POA: Insufficient documentation

## 2016-01-08 DIAGNOSIS — E1142 Type 2 diabetes mellitus with diabetic polyneuropathy: Secondary | ICD-10-CM | POA: Diagnosis not present

## 2016-01-08 DIAGNOSIS — I10 Essential (primary) hypertension: Secondary | ICD-10-CM

## 2016-01-08 DIAGNOSIS — R600 Localized edema: Secondary | ICD-10-CM | POA: Insufficient documentation

## 2016-01-08 DIAGNOSIS — Z7982 Long term (current) use of aspirin: Secondary | ICD-10-CM | POA: Diagnosis not present

## 2016-01-08 DIAGNOSIS — M1990 Unspecified osteoarthritis, unspecified site: Secondary | ICD-10-CM | POA: Diagnosis not present

## 2016-01-08 DIAGNOSIS — J9690 Respiratory failure, unspecified, unspecified whether with hypoxia or hypercapnia: Secondary | ICD-10-CM | POA: Diagnosis not present

## 2016-01-08 DIAGNOSIS — I110 Hypertensive heart disease with heart failure: Secondary | ICD-10-CM | POA: Insufficient documentation

## 2016-01-08 DIAGNOSIS — I509 Heart failure, unspecified: Secondary | ICD-10-CM | POA: Diagnosis not present

## 2016-01-08 DIAGNOSIS — E785 Hyperlipidemia, unspecified: Secondary | ICD-10-CM | POA: Diagnosis not present

## 2016-01-08 DIAGNOSIS — R609 Edema, unspecified: Secondary | ICD-10-CM

## 2016-01-08 DIAGNOSIS — R0602 Shortness of breath: Secondary | ICD-10-CM | POA: Diagnosis not present

## 2016-01-08 HISTORY — DX: Heart failure, unspecified: I50.9

## 2016-01-08 LAB — BASIC METABOLIC PANEL
Anion gap: 6 (ref 5–15)
BUN: 37 mg/dL — ABNORMAL HIGH (ref 6–20)
CO2: 25 mmol/L (ref 22–32)
Calcium: 9.6 mg/dL (ref 8.9–10.3)
Chloride: 108 mmol/L (ref 101–111)
Creatinine, Ser: 1.72 mg/dL — ABNORMAL HIGH (ref 0.61–1.24)
GFR calc Af Amer: 41 mL/min — ABNORMAL LOW (ref >60)
GFR calc non Af Amer: 36 mL/min — ABNORMAL LOW (ref >60)
Glucose, Bld: 149 mg/dL — ABNORMAL HIGH (ref 65–99)
Potassium: 4.3 mmol/L (ref 3.5–5.1)
Sodium: 139 mmol/L (ref 135–145)

## 2016-01-08 LAB — CBC WITH DIFFERENTIAL/PLATELET
Basophils Absolute: 0.1 10*3/uL (ref 0.0–0.1)
Basophils Relative: 1 %
Eosinophils Absolute: 0.4 10*3/uL (ref 0.0–0.7)
Eosinophils Relative: 4 %
HCT: 37.9 % — ABNORMAL LOW (ref 39.0–52.0)
Hemoglobin: 11.9 g/dL — ABNORMAL LOW (ref 13.0–17.0)
Lymphocytes Relative: 14 %
Lymphs Abs: 1.2 10*3/uL (ref 0.7–4.0)
MCH: 29.3 pg (ref 26.0–34.0)
MCHC: 31.4 g/dL (ref 30.0–36.0)
MCV: 93.3 fL (ref 78.0–100.0)
Monocytes Absolute: 0.8 10*3/uL (ref 0.1–1.0)
Monocytes Relative: 9 %
Neutro Abs: 6 10*3/uL (ref 1.7–7.7)
Neutrophils Relative %: 72 %
Platelets: 149 10*3/uL — ABNORMAL LOW (ref 150–400)
RBC: 4.06 MIL/uL — ABNORMAL LOW (ref 4.22–5.81)
RDW: 15 % (ref 11.5–15.5)
WBC: 8.4 10*3/uL (ref 4.0–10.5)

## 2016-01-08 LAB — TROPONIN I
Troponin I: 0.05 ng/mL — ABNORMAL HIGH (ref <0.031)
Troponin I: 0.04 ng/mL — ABNORMAL HIGH (ref <0.031)

## 2016-01-08 LAB — BRAIN NATRIURETIC PEPTIDE: B Natriuretic Peptide: 1905 pg/mL — ABNORMAL HIGH (ref 0.0–100.0)

## 2016-01-08 MED ORDER — POTASSIUM CHLORIDE CRYS ER 20 MEQ PO TBCR
60.0000 meq | EXTENDED_RELEASE_TABLET | Freq: Once | ORAL | Status: AC
Start: 2016-01-08 — End: 2016-01-08
  Administered 2016-01-08: 60 meq via ORAL
  Filled 2016-01-08: qty 3

## 2016-01-08 MED ORDER — CARVEDILOL 12.5 MG PO TABS
6.2500 mg | ORAL_TABLET | Freq: Once | ORAL | Status: AC
  Administered 2016-01-08: 6.25 mg via ORAL
  Filled 2016-01-08: qty 1

## 2016-01-08 MED ORDER — HYDRALAZINE HCL 20 MG/ML IJ SOLN
5.0000 mg | Freq: Once | INTRAMUSCULAR | Status: AC
  Administered 2016-01-08: 5 mg via INTRAVENOUS
  Filled 2016-01-08: qty 1

## 2016-01-08 MED ORDER — CARVEDILOL 6.25 MG PO TABS: 6.2500 mg | ORAL_TABLET | Freq: Two times a day (BID) | ORAL | Status: DC

## 2016-01-08 MED ORDER — FUROSEMIDE 10 MG/ML IJ SOLN
80.0000 mg | Freq: Once | INTRAMUSCULAR | Status: AC
  Administered 2016-01-08: 80 mg via INTRAVENOUS
  Filled 2016-01-08: qty 8

## 2016-01-08 NOTE — ED Notes (Signed)
Pt was taken of lasix last week. Pt has been having increased leg swelling since then with intermittent shortness of breath. Pt states he started taking his lasix on his own yesterday because he couldn't get in contact with his PCP. NAD noted. Pt vs stable in triage,

## 2016-01-08 NOTE — Discharge Instructions (Signed)
Increase your Lasix to 40 mg twice per day. Do this for 3 days then resume your previous dosing. Take potassium supplementation. You are being given a prescription for increased dose of Coreg as Dr. Bronson Ing recommended recently.   Edema Edema is an abnormal buildup of fluids in your bodytissues. Edema is somewhatdependent on gravity to pull the fluid to the lowest place in your body. That makes the condition more common in the legs and thighs (lower extremities). Painless swelling of the feet and ankles is common and becomes more likely as you get older. It is also common in looser tissues, like around your eyes.  When the affected area is squeezed, the fluid may move out of that spot and leave a dent for a few moments. This dent is called pitting.  CAUSES  There are many possible causes of edema. Eating too much salt and being on your feet or sitting for a long time can cause edema in your legs and ankles. Hot weather may make edema worse. Common medical causes of edema include:  Heart failure.  Liver disease.  Kidney disease.  Weak blood vessels in your legs.  Cancer.  An injury.  Pregnancy.  Some medications.  Obesity. SYMPTOMS  Edema is usually painless.Your skin may look swollen or shiny.  DIAGNOSIS  Your health care provider may be able to diagnose edema by asking about your medical history and doing a physical exam. You may need to have tests such as X-rays, an electrocardiogram, or blood tests to check for medical conditions that may cause edema.  TREATMENT  Edema treatment depends on the cause. If you have heart, liver, or kidney disease, you need the treatment appropriate for these conditions. General treatment may include:  Elevation of the affected body part above the level of your heart.  Compression of the affected body part. Pressure from elastic bandages or support stockings squeezes the tissues and forces fluid back into the blood vessels. This keeps fluid  from entering the tissues.  Restriction of fluid and salt intake.  Use of a water pill (diuretic). These medications are appropriate only for some types of edema. They pull fluid out of your body and make you urinate more often. This gets rid of fluid and reduces swelling, but diuretics can have side effects. Only use diuretics as directed by your health care provider. HOME CARE INSTRUCTIONS   Keep the affected body part above the level of your heart when you are lying down.   Do not sit still or stand for prolonged periods.   Do not put anything directly under your knees when lying down.  Do not wear constricting clothing or garters on your upper legs.   Exercise your legs to work the fluid back into your blood vessels. This may help the swelling go down.   Wear elastic bandages or support stockings to reduce ankle swelling as directed by your health care provider.   Eat a low-salt diet to reduce fluid if your health care provider recommends it.   Only take medicines as directed by your health care provider. SEEK MEDICAL CARE IF:   Your edema is not responding to treatment.  You have heart, liver, or kidney disease and notice symptoms of edema.  You have edema in your legs that does not improve after elevating them.   You have sudden and unexplained weight gain. SEEK IMMEDIATE MEDICAL CARE IF:   You develop shortness of breath or chest pain.   You cannot breathe when you  lie down.  You develop pain, redness, or warmth in the swollen areas.   You have heart, liver, or kidney disease and suddenly get edema.  You have a fever and your symptoms suddenly get worse. MAKE SURE YOU:   Understand these instructions.  Will watch your condition.  Will get help right away if you are not doing well or get worse.   This information is not intended to replace advice given to you by your health care provider. Make sure you discuss any questions you have with your health  care provider.   Document Released: 08/08/2005 Document Revised: 08/29/2014 Document Reviewed: 05/31/2013 Elsevier Interactive Patient Education [employer].

## 2016-01-08 NOTE — ED Provider Notes (Signed)
CSN: [id number]     Arrival date & time 01/08/16  1507 History   First MD Initiated Contact with Patient 01/08/16 1543     Chief Complaint  Patient presents with  . Leg Swelling     (Consider location/radiation/quality/duration/timing/severity/associated sxs/prior Treatment) HPI   82yM with increasing LE edema. Says that he was told by a provider almost two weeks ago to stop his lasix and potassium. He is not sure why though. He began having increasing LE edema so started taking them again 4 days ago but swelling has persisted. Legs feels tight and heavy. Mild dyspnea which is worse with exertion. No CP. No fever, chills or cough. Noted to be very hypertensive. Reports his cardiologist recently increased his hydralazine to 50mg  but then had him resume 25mg  again after he developed itching. He was supposed to increase his coreg to 6.25mg  BID instead but says his pharmacy did not receive a prescription for this.   Past Medical History  Diagnosis Date  . Hypertension   . High potassium   . Varicose veins   . Hyperlipidemia   . Basal cell carcinoma of forehead 2016 X 2  . Basal cell carcinoma of left earlobe   . Old myocardial infarct     "sometime in the past; don't know when" (02/07/2015)  . Type II diabetes mellitus ([place])   . Pneumonia X 1  . GERD (gastroesophageal reflux disease)   . Arthritis     "legs" (02/07/2015)  . History of gout X 1  . Diabetic peripheral neuropathy ([place])     "left foot" (02/07/2015)  . Respiratory failure ([place])   . Pain and swelling of left lower leg     chronic  . Renal insufficiency   . CHF (congestive heart failure) [facility])    Past Surgical History  Procedure Laterality Date  . Knee arthroscopy Right ~ 2008  . Orif hip fracture  03/20/2012    Procedure: OPEN REDUCTION INTERNAL FIXATION HIP;  Surgeon: Sanjuana Kava, MD;  Location: [facility];  Service: Orthopedics;  Laterality: Right;  . Lower extremity venous doppler  07/11/2012    No evidence of DVT in  the left lower extremity. Evidence of partially recanalized, chronic, non-obstructive thrombus in the left great SV and its branches consistent with significant reflux consistent with post-phlebitic syndrome. Significant reflux of the left short saphenous vein.  . Cardiovascular stress test  05/29/2012    Mild-moderate perfusion defect due to infarct/scar with mild-moderate perinfarct ischemia seen in the mid anterior, apical anterior, apical septal, and apical regions. No ECG changes. Global LV systolic function is severely reduced.  . Transthoracic echocardiogram  04/18/2012    EF 45%, mild-moderate LVH  . Transthoracic echocardiogram  04/18/12    EF% 45%.SEVERE HYPOKINESIS TO AKINESIS OF THE MID-DISTAL INFEROLATERAL MYOCARDIUM AND MUCH OF THE APEX.  Marland Kitchen Lexiscam myocardial perfusion  05/29/12    MARKED PERFUSION DEFECT DUE TO INFARC/SCAR WITH MILD PERINFARCT ISCHEMIA IN THE BASAL INFERIOR, MID INFEROSEPTAL, MID INFERIOR AND APICALINFERIOR REGION. EF%33%. PERIINFARCT ISCHEMIA IN THE MID ANTERIOR, APICAL ANTERIOR, APICAL SEPTAL AND APICAL REGIONS.  . Fracture surgery    . Cystoscopy w/ stone manipulation  X 1  . Basal cell carcinoma excision      "probably 1/2 dozen cut off face, left ear" (02/07/2015)  . Cardiac catheterization N/A 02/09/2015    Procedure: Left Heart Cath and Coronary Angiography;  Surgeon: Leonie Man, MD;  Location: [place] [facility];  Service: Cardiovascular;  Laterality: N/A;  .  Coronary artery bypass graft N/A 02/17/2015    Procedure: CORONARY ARTERY BYPASS GRAFTING (CABG), ON PUMP, TIMES FIVE, USING LEFT INTERNAL MAMMARY ARTERY, RIGHT GREATER SAPHENOUS VEIN HARVESTED ENDOSCOPICALLY;  Surgeon: [name], MD;  Location: [place];  Service: Open Heart Surgery;  Laterality: N/A;  -LIMA to LAD -SVG to DIAGONAL - SEQ SVG to OM1 and PLB -SVG to PDA  . Tee without cardioversion N/A 02/17/2015    Procedure: TRANSESOPHAGEAL ECHOCARDIOGRAM (TEE);  Surgeon: [name], MD;   Location: [place];  Service: Open Heart Surgery;  Laterality: N/A;  . Open heart sx  02/17/15  . Cataract extraction w/phaco Left 11/12/2015    Procedure: CATARACT EXTRACTION PHACO AND INTRAOCULAR LENS PLACEMENT LEFT EYE;  Surgeon: Tonny Branch, MD;  Location: [facility];  Service: Ophthalmology;  Laterality: Left;  CDE: 9.82  . Cataract extraction w/phaco Right 12/14/2015    Procedure: CATARACT EXTRACTION PHACO AND INTRAOCULAR LENS PLACEMENT (IOC);  Surgeon: Tonny Branch, MD;  Location: [facility];  Service: Ophthalmology;  Laterality: Right;  CDE: 13.86   Family History  Problem Relation Age of Onset  . Deep vein thrombosis Mother   . Hypertension Mother   . Hypertension Sister   . Diabetes Brother   . Hyperlipidemia Brother   . Hypertension Brother   . Heart attack Brother    Social History  Substance Use Topics  . Smoking status: Never Smoker   . Smokeless tobacco: Never Used  . Alcohol Use: No    Review of Systems  All systems reviewed and negative, other than as noted in HPI.   Allergies  Bactrim  Home Medications   Prior to Admission medications   Medication Sig Start Date End Date Taking? Authorizing Provider  apixaban (ELIQUIS) 2.5 MG TABS tablet Take 1 tablet (2.5 mg total) by mouth 2 (two) times daily. 04/10/15   Herminio Commons, MD  aspirin EC 81 MG tablet Take 81 mg by mouth every morning.    Historical Provider, MD  atorvastatin (LIPITOR) 20 MG tablet TAKE 1 TABLET BY MOUTH DAILY AT 6:00 P.M. ( MUST SCHEDULE AN APPOINTMENT FOR FUTURE REFILLS ) 03/27/14   Lorretta Harp, MD  Bromfenac Sodium (PROLENSA) 0.07 % SOLN Apply 1 drop to eye daily.    Historical Provider, MD  carvedilol (COREG) 6.25 MG tablet Take 1 tablet (6.25 mg total) by mouth 2 (two) times daily. 12/02/15   Herminio Commons, MD  cloNIDine (CATAPRES) 0.2 MG tablet Take 0.2 mg by mouth 2 (two) times daily. 01/20/15   Historical Provider, MD  diclofenac sodium (VOLTAREN) 1 % GEL Apply 2 g topically 2 (two) times  daily.    Historical Provider, MD  Difluprednate (DUREZOL) 0.05 % EMUL Apply 1 drop to eye daily.    Historical Provider, MD  ELIQUIS 2.5 MG TABS tablet TAKE ONE TABLET BY MOUTH TWICE DAILY 10/27/15   Herminio Commons, MD  ferrous sulfate 325 (65 FE) MG tablet Take 1 tablet (325 mg total) by mouth daily with breakfast. 02/24/15   Donielle Liston Alba, PA-C  furosemide (LASIX) 40 MG tablet Take 40 mg by mouth daily.    Historical Provider, MD  gabapentin (NEURONTIN) 100 MG capsule Take 100 mg by mouth 3 (three) times daily.  04/01/14   Historical Provider, MD  hydrALAZINE (APRESOLINE) 50 MG tablet Take 0.5 tablets (25 mg total) by mouth 3 (three) times daily. 12/02/15   Herminio Commons, MD  LEVEMIR FLEXTOUCH 100 UNIT/ML Pen Inject 27 Units into the  skin at bedtime. Patient taking differently: Inject 32 Units into the skin at bedtime.  03/05/15   Radene Gunning, NP  LORazepam (ATIVAN) 1 MG tablet Take 1 tablet (1 mg total) by mouth at bedtime as needed for sleep. Patient taking differently: Take 1 mg by mouth at bedtime as needed for anxiety or sleep.  03/05/15   Lauree Chandler, NP  losartan (COZAAR) 100 MG tablet Take 100 mg by mouth daily.    Historical Provider, MD  metoprolol tartrate (LOPRESSOR) 25 MG tablet Take 25 mg by mouth 2 (two) times daily.    Historical Provider, MD  omeprazole (PRILOSEC) 20 MG capsule Take 20 mg by mouth daily.    Historical Provider, MD  potassium chloride (K-DUR) 10 MEQ tablet Take 10 mEq by mouth daily.    Historical Provider, MD  torsemide (DEMADEX) 20 MG tablet Take 1 tablet (20 mg total) by mouth 2 (two) times daily. 10/06/15   Kathie Dike, MD  traMADol (ULTRAM) 50 MG tablet Take 1 tablet (50 mg total) by mouth every 6 (six) hours as needed for moderate pain. 03/05/15   Lauree Chandler, NP   BP 190/116 mmHg  Pulse 93  Temp(Src) 98 F (36.7 C) (Oral)  Resp 20  Ht 5' 9.5" (1.765 m)  Wt 214 lb (97.07 kg)  BMI 31.16 kg/m2  SpO2 98% Physical Exam   Constitutional: He appears well-developed and well-nourished. No distress.  HENT:  Head: Normocephalic and atraumatic.  Eyes: Conjunctivae are normal. Right eye exhibits no discharge. Left eye exhibits no discharge.  Neck: Neck supple.  Cardiovascular: Normal rate, regular rhythm and normal heart sounds.  Exam reveals no gallop and no friction rub.   No murmur heard. Pulmonary/Chest: Effort normal and breath sounds normal. No respiratory distress.  Abdominal: Soft. He exhibits no distension. There is no tenderness.  Musculoskeletal: He exhibits edema. He exhibits no tenderness.  Symmetric pitting lower extremity edema  Neurological: He is alert.  Skin: Skin is warm and dry.  Psychiatric: He has a normal mood and affect. His behavior is normal. Thought content normal.  Nursing note and vitals reviewed.   ED Course  Procedures (including critical care time) Labs Review Labs Reviewed  CBC WITH DIFFERENTIAL/PLATELET - Abnormal; Notable for the following:    RBC 4.06 (*)    Hemoglobin 11.9 (*)    HCT 37.9 (*)    Platelets 149 (*)    All other components within normal limits  BRAIN NATRIURETIC PEPTIDE  TROPONIN I  BASIC METABOLIC PANEL    Imaging Review Dg Chest 2 View  01/08/2016  CLINICAL DATA:  Shortness of breath, hypertension, CHF. History of basal cell carcinoma. History of cardiac bypass surgery EXAM: CHEST  2 VIEW COMPARISON:  Chest x-rays dated 10/03/2015 06/28/2015. FINDINGS: Cardiomegaly is stable. Median sternotomy wires appear intact and stable in alignment. There is mild central pulmonary vascular congestion and interstitial edema without overt pulmonary edema. No confluent airspace opacity to suggest a developing pneumonia. No pleural effusion or pneumothorax seen. Degenerative spurring again noted within the thoracic spine. No acute- appearing osseous abnormality. IMPRESSION: Findings suggest mild CHF/volume overload. Electronically Signed   By: Franki Cabot M.D.   On:  01/08/2016 16:00   I have personally reviewed and evaluated these images and lab results as part of my medical decision-making.   EKG Interpretation   Date/Time:  Friday Jan 08 2016 17:30:20 EDT Ventricular Rate:  97 PR Interval:  246 QRS Duration: 90 QT Interval:  346  QTC Calculation: 439 R Axis:   98 Text Interpretation:  Sinus tachycardia Multiform ventricular premature  complexes Prolonged PR interval Anterior infarct, old Borderline  repolarization abnormality Confirmed by Wilson Singer  MD, Noal Abshier [id number]) on  01/08/2016 6:33:19 PM      MDM   Final diagnoses:  Peripheral edema  Essential hypertension   82 year old male with worsening leg swelling after temporarily stopping his diuretic. He does endorse some dyspnea. He is in no acute distress though. No increased work of breathing. Oxygen saturations are normal on room air. His lungs sound clear but his chest x-ray does look somewhat wet. Denies any chest pain. He does have a minimally elevated troponin. Not sure the significance of this particularly with renal impairment and heart failure. Will repeat to make sure it is not rising significantly. If not, I feel he is appropriate for outpatient treatment. Will temporarily increase his Lasix to 40 mg twice a day. Will provide him with a prescription for his increased dosing of Coreg.   Repeat troponin stable. He has no new complaints. I feel he is appropriate for discharge at this time. Return precautions were discussed.  Virgel Manifold, MD 01/14/16 [phone]

## 2016-01-08 NOTE — ED Notes (Addendum)
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

## 2016-01-11 DIAGNOSIS — E875 Hyperkalemia: Secondary | ICD-10-CM | POA: Diagnosis not present

## 2016-01-11 DIAGNOSIS — R600 Localized edema: Secondary | ICD-10-CM | POA: Diagnosis not present

## 2016-01-11 DIAGNOSIS — N183 Chronic kidney disease, stage 3 (moderate): Secondary | ICD-10-CM | POA: Diagnosis not present

## 2016-01-22 ENCOUNTER — Telehealth: Payer: Self-pay | Admitting: *Deleted

## 2016-01-22 MED ORDER — APIXABAN 2.5 MG PO TABS: 2.5000 mg | ORAL_TABLET | Freq: Two times a day (BID) | ORAL | Status: DC

## 2016-01-22 NOTE — Telephone Encounter (Signed)
Patient said his copay went from $45 to $154 and he can't afford this. Samples provided for patient to pick up today.

## 2016-01-25 DIAGNOSIS — E875 Hyperkalemia: Secondary | ICD-10-CM | POA: Diagnosis not present

## 2016-01-25 DIAGNOSIS — R600 Localized edema: Secondary | ICD-10-CM | POA: Diagnosis not present

## 2016-01-25 DIAGNOSIS — N183 Chronic kidney disease, stage 3 (moderate): Secondary | ICD-10-CM | POA: Diagnosis not present

## 2016-01-25 DIAGNOSIS — I10 Essential (primary) hypertension: Secondary | ICD-10-CM | POA: Diagnosis not present

## 2016-01-26 NOTE — Telephone Encounter (Signed)
Call placed to [facility].  The cost below is accurate.  Patient is in the dough-nut hole.  Per pharm, cost of insulin has gone up about 10% plus the cost of all of his other medications.    Patient made aware of above.  Phone number provided for [place] [phone]).    Patient states he has enough samples for about a month between what our office gave him & his PMD.

## 2016-02-12 ENCOUNTER — Telehealth: Payer: Self-pay | Admitting: *Deleted

## 2016-02-12 DIAGNOSIS — N2581 Secondary hyperparathyroidism of renal origin: Secondary | ICD-10-CM | POA: Diagnosis not present

## 2016-02-12 DIAGNOSIS — N184 Chronic kidney disease, stage 4 (severe): Secondary | ICD-10-CM | POA: Diagnosis not present

## 2016-02-12 DIAGNOSIS — I10 Essential (primary) hypertension: Secondary | ICD-10-CM | POA: Diagnosis not present

## 2016-02-12 DIAGNOSIS — E8770 Fluid overload, unspecified: Secondary | ICD-10-CM | POA: Diagnosis not present

## 2016-02-12 DIAGNOSIS — R809 Proteinuria, unspecified: Secondary | ICD-10-CM | POA: Diagnosis not present

## 2016-02-12 NOTE — Telephone Encounter (Signed)
Cassandra made aware, updated medication list and nurse will call pt

## 2016-02-12 NOTE — Telephone Encounter (Signed)
Cassandra from [facility] calling to confirm pt should be on carvedilol 6.25 bid and metoprolol 25 mg bid. LOV 11/26/15 both medication listed. March 2017 OV only carvedilol was listed. Will forward to Dr. Bronson Ing to clarify if pt should be on 2 beta blockers.

## 2016-02-12 NOTE — Telephone Encounter (Signed)
Only Coreg.

## 2016-02-24 ENCOUNTER — Encounter: Payer: Self-pay | Admitting: Cardiovascular Disease

## 2016-02-24 ENCOUNTER — Ambulatory Visit (INDEPENDENT_AMBULATORY_CARE_PROVIDER_SITE_OTHER): Payer: Medicare Other | Admitting: Cardiovascular Disease

## 2016-02-24 VITALS — BP 138/82 | HR 57 | Ht 69.0 in | Wt 221.0 lb

## 2016-02-24 DIAGNOSIS — N183 Chronic kidney disease, stage 3 (moderate): Secondary | ICD-10-CM | POA: Diagnosis not present

## 2016-02-24 DIAGNOSIS — R600 Localized edema: Secondary | ICD-10-CM | POA: Diagnosis not present

## 2016-02-24 DIAGNOSIS — I2782 Chronic pulmonary embolism: Secondary | ICD-10-CM

## 2016-02-24 DIAGNOSIS — I25768 Atherosclerosis of bypass graft of coronary artery of transplanted heart with other forms of angina pectoris: Secondary | ICD-10-CM

## 2016-02-24 DIAGNOSIS — R000 Tachycardia, unspecified: Secondary | ICD-10-CM

## 2016-02-24 DIAGNOSIS — I5023 Acute on chronic systolic (congestive) heart failure: Secondary | ICD-10-CM

## 2016-02-24 DIAGNOSIS — Z5181 Encounter for therapeutic drug level monitoring: Secondary | ICD-10-CM

## 2016-02-24 DIAGNOSIS — I429 Cardiomyopathy, unspecified: Secondary | ICD-10-CM

## 2016-02-24 DIAGNOSIS — I10 Essential (primary) hypertension: Secondary | ICD-10-CM

## 2016-02-24 MED ORDER — TORSEMIDE 20 MG PO TABS: 40.0000 mg | ORAL_TABLET | Freq: Two times a day (BID) | ORAL | Status: DC

## 2016-02-24 NOTE — Patient Instructions (Addendum)
Medication Instructions:   Stop Lasix (Furosemide).  Increase Torsemide to 40mg  twice a day  X 3 days only, then decrease back to previous       dose of 20mg  twice a day.   Small prescription for the increased dose above sent to [facility] today.   Labwork:  BMET - order given today.   Office will contact with results via phone or letter.    Testing/Procedures: NONE  Follow-Up: 6 weeks   Any Other Special Instructions Will Be Listed Below (If Applicable).  If you need a refill on your cardiac medications before your next appointment, please call your pharmacy.

## 2016-02-24 NOTE — Addendum Note (Signed)
Addended by: Laurine Blazer on: 02/24/2016 02:51 PM   Modules accepted: Orders, Medications

## 2016-02-24 NOTE — Progress Notes (Signed)
Patient ID: Zachary Mcmahon, male   DOB: [birth year], 82 y.o.   MRN: [id number]      SUBJECTIVE: The patient returns for follow-up of coronary artery disease and chronic systolic and diastolic heart failure. Nuclear stress test on 10/23/15 demonstrated a large defect involving the inferoseptal, inferior, and lateral wall extending from the apex to the base with some degree of reversibility suggestive of moderate lateral ischemia. It was deemed a high risk study.  Echocardiogram performed on 10/04/15 demonstrated mild to moderately reduced left ventricular systolic function, EF [id number], with inferior wall hypokinesis, grade 2 diastolic dysfunction , and mild to moderate mitral regurgitation.   He underwent 5 vessel coronary artery bypass graft surgery on 02/17/15. He has a history of pulmonary embolism and is anticoagulated with Eliquis. He also has chronic kidney disease stage III. The EF had dropped and had previously been normal , 50-55%, in July 2016.  He denies chest pain and shortness of breath. He has leg swelling and was instructed by a physician to increase Lasix from 40 to 60 mg daily. However it appears he is taking both Lasix and torsemide.   Review of Systems: As per "subjective", otherwise negative.  Allergies  Allergen Reactions  . Bactrim [Sulfamethoxazole-Trimethoprim] Rash    Current Outpatient Prescriptions  Medication Sig Dispense Refill  . acetaminophen (TYLENOL) 500 MG tablet Take 500 mg by mouth every 6 (six) hours as needed for mild pain.    Marland Kitchen apixaban (ELIQUIS) 2.5 MG TABS tablet Take 1 tablet (2.5 mg total) by mouth 2 (two) times daily. 28 tablet 0  . aspirin EC 81 MG tablet Take 81 mg by mouth every morning.    Marland Kitchen atorvastatin (LIPITOR) 20 MG tablet TAKE 1 TABLET BY MOUTH DAILY AT 6:00 P.M. ( MUST SCHEDULE AN APPOINTMENT FOR FUTURE REFILLS ) (Patient taking differently: TAKE 1 TABLET BY MOUTH DAILY AT 6:00 P.M.) 30 tablet 0  . Bromfenac Sodium (PROLENSA) 0.07 % SOLN  Apply 1 drop to eye daily.    . carvedilol (COREG) 6.25 MG tablet Take 1 tablet (6.25 mg total) by mouth 2 (two) times daily. 180 tablet 3  . diclofenac sodium (VOLTAREN) 1 % GEL Apply 2 g topically 2 (two) times daily.    . Difluprednate (DUREZOL) 0.05 % EMUL Apply 1 drop to eye daily.    . ferrous sulfate 325 (65 FE) MG tablet Take 1 tablet (325 mg total) by mouth daily with breakfast.  3  . furosemide (LASIX) 40 MG tablet Take 40-60 mg by mouth daily.     Marland Kitchen gabapentin (NEURONTIN) 100 MG capsule Take 100 mg by mouth 3 (three) times daily.     . hydrALAZINE (APRESOLINE) 50 MG tablet Take 0.5 tablets (25 mg total) by mouth 3 (three) times daily. (Patient taking differently: Take 50 mg by mouth 3 (three) times daily. ) 135 tablet 3  . LEVEMIR FLEXTOUCH 100 UNIT/ML Pen Inject 27 Units into the skin at bedtime. (Patient taking differently: Inject 36 Units into the skin at bedtime. )    . LORazepam (ATIVAN) 1 MG tablet Take 1 tablet (1 mg total) by mouth at bedtime as needed for sleep. (Patient taking differently: Take 1 mg by mouth at bedtime as needed for anxiety or sleep. ) 30 tablet 5  . losartan (COZAAR) 100 MG tablet Take 100 mg by mouth daily.    . minocycline (MINOCIN,DYNACIN) 100 MG capsule Take 100 mg by mouth 2 (two) times daily. 10 day course starting on 12/26/2015    .  omeprazole (PRILOSEC) 20 MG capsule Take 20 mg by mouth daily.    Marland Kitchen torsemide (DEMADEX) 20 MG tablet Take 1 tablet (20 mg total) by mouth 2 (two) times daily. 60 tablet 0  . traMADol (ULTRAM) 50 MG tablet Take 1 tablet (50 mg total) by mouth every 6 (six) hours as needed for moderate pain. 120 tablet 5   No current facility-administered medications for this visit.    Past Medical History  Diagnosis Date  . Hypertension   . High potassium   . Varicose veins   . Hyperlipidemia   . Basal cell carcinoma of forehead 2016 X 2  . Basal cell carcinoma of left earlobe   . Old myocardial infarct     "sometime in the past;  don't know when" (02/07/2015)  . Type II diabetes mellitus ([place])   . Pneumonia X 1  . GERD (gastroesophageal reflux disease)   . Arthritis     "legs" (02/07/2015)  . History of gout X 1  . Diabetic peripheral neuropathy ([place])     "left foot" (02/07/2015)  . Respiratory failure ([place])   . Pain and swelling of left lower leg     chronic  . Renal insufficiency   . CHF (congestive heart failure) [facility])     Past Surgical History  Procedure Laterality Date  . Knee arthroscopy Right ~ 2008  . Orif hip fracture  03/20/2012    Procedure: OPEN REDUCTION INTERNAL FIXATION HIP;  Surgeon: Sanjuana Kava, MD;  Location: [facility];  Service: Orthopedics;  Laterality: Right;  . Lower extremity venous doppler  07/11/2012    No evidence of DVT in the left lower extremity. Evidence of partially recanalized, chronic, non-obstructive thrombus in the left great SV and its branches consistent with significant reflux consistent with post-phlebitic syndrome. Significant reflux of the left short saphenous vein.  . Cardiovascular stress test  05/29/2012    Mild-moderate perfusion defect due to infarct/scar with mild-moderate perinfarct ischemia seen in the mid anterior, apical anterior, apical septal, and apical regions. No ECG changes. Global LV systolic function is severely reduced.  . Transthoracic echocardiogram  04/18/2012    EF 45%, mild-moderate LVH  . Transthoracic echocardiogram  04/18/12    EF% 45%.SEVERE HYPOKINESIS TO AKINESIS OF THE MID-DISTAL INFEROLATERAL MYOCARDIUM AND MUCH OF THE APEX.  Marland Kitchen Lexiscam myocardial perfusion  05/29/12    MARKED PERFUSION DEFECT DUE TO INFARC/SCAR WITH MILD PERINFARCT ISCHEMIA IN THE BASAL INFERIOR, MID INFEROSEPTAL, MID INFERIOR AND APICALINFERIOR REGION. EF%33%. PERIINFARCT ISCHEMIA IN THE MID ANTERIOR, APICAL ANTERIOR, APICAL SEPTAL AND APICAL REGIONS.  . Fracture surgery    . Cystoscopy w/ stone manipulation  X 1  . Basal cell carcinoma excision      "probably 1/2 dozen cut  off face, left ear" (02/07/2015)  . Cardiac catheterization N/A 02/09/2015    Procedure: Left Heart Cath and Coronary Angiography;  Surgeon: Leonie Man, MD;  Location: [facility];  Service: Cardiovascular;  Laterality: N/A;  . Coronary artery bypass graft N/A 02/17/2015    Procedure: CORONARY ARTERY BYPASS GRAFTING (CABG), ON PUMP, TIMES FIVE, USING LEFT INTERNAL MAMMARY ARTERY, RIGHT GREATER SAPHENOUS VEIN HARVESTED ENDOSCOPICALLY;  Surgeon: [name], MD;  Location: [facility];  Service: Open Heart Surgery;  Laterality: N/A;  -LIMA to LAD -SVG to DIAGONAL - SEQ SVG to OM1 and PLB -SVG to PDA  . Tee without cardioversion N/A 02/17/2015    Procedure: TRANSESOPHAGEAL ECHOCARDIOGRAM (TEE);  Surgeon: [name], MD;  Location: [facility];  Service: Open Heart Surgery;  Laterality: N/A;  . Open heart sx  02/17/15  . Cataract extraction w/phaco Left 11/12/2015    Procedure: CATARACT EXTRACTION PHACO AND INTRAOCULAR LENS PLACEMENT LEFT EYE;  Surgeon: Tonny Branch, MD;  Location: [facility];  Service: Ophthalmology;  Laterality: Left;  CDE: 9.82  . Cataract extraction w/phaco Right 12/14/2015    Procedure: CATARACT EXTRACTION PHACO AND INTRAOCULAR LENS PLACEMENT (IOC);  Surgeon: Tonny Branch, MD;  Location: [facility];  Service: Ophthalmology;  Laterality: Right;  CDE: 13.86    Social History   Social History  . Marital Status: Married    Spouse Name: N/A  . Number of Children: N/A  . Years of Education: N/A   Occupational History  . Not on file.   Social History Main Topics  . Smoking status: Never Smoker   . Smokeless tobacco: Never Used  . Alcohol Use: No  . Drug Use: No  . Sexual Activity: Not Currently   Other Topics Concern  . Not on file   Social History Narrative     Filed Vitals:   02/24/16 1355  BP: 138/82  Pulse: 57  Height: 5\' 9"  (1.753 m)  Weight: 221 lb (100.245 kg)  SpO2: 96%    PHYSICAL EXAM General: NAD HEENT: Normal. Neck: No JVD, no  thyromegaly. Lungs: Clear to auscultation bilaterally with normal respiratory effort. CV: Nondisplaced PMI.  Regular rate and rhythm, normal S1/S2, no S3/S4, no murmur. 1+ pitting pretibial edema b/l.   Abdomen: Soft, nontender, no distention.  Neurologic: Alert and oriented.  Psych: Normal affect. Skin: Normal. Musculoskeletal: No gross deformities.    ECG: Most recent ECG reviewed.      ASSESSMENT AND PLAN: 1. Acute on chronic combined systolic and diastolic heart failure: On both torsemide 20 mg bid and Lasix. HR normalized with addition of Coreg. Lexiscan Cardiolite indicates ischemic etiology and was deemed a high risk study. Has CKD stage III. We discussed coronary angiography again and he does not want to proceed at this time. Will increase torsemide to 40 mg bid x 3 days then reduce back to 20 mg bid. Will check BMET. Will dc Lasix as he should not be on two loop diuretics.  2. CAD s/p 5-v CABG: On ASA and Lipitor. Lexiscan Cardiolite indicative of ischemic etiology for EF reduction. Continue Coreg. We discussed coronary angiography again and he does not want to proceed at this time.  3. Pulmonary embolism: Continue Eliquis.  4. Essential HTN: Controlled. No changes.  5. Hyperlipidemia: Continue Lipitor.  6. Tachycardia: Resolved with Coreg.  Dispo: f/u 6 weeks.  Kate Sable, M.D., F.A.C.C.

## 2016-03-02 DIAGNOSIS — I509 Heart failure, unspecified: Secondary | ICD-10-CM | POA: Diagnosis not present

## 2016-03-02 DIAGNOSIS — I10 Essential (primary) hypertension: Secondary | ICD-10-CM | POA: Diagnosis not present

## 2016-03-02 DIAGNOSIS — N183 Chronic kidney disease, stage 3 (moderate): Secondary | ICD-10-CM | POA: Diagnosis not present

## 2016-03-02 DIAGNOSIS — R600 Localized edema: Secondary | ICD-10-CM | POA: Diagnosis not present

## 2016-03-04 ENCOUNTER — Telehealth: Payer: Self-pay | Admitting: *Deleted

## 2016-03-04 MED ORDER — HYDRALAZINE HCL 50 MG PO TABS
25.0000 mg | ORAL_TABLET | Freq: Two times a day (BID) | ORAL | Status: DC
Start: 2016-03-04 — End: 2016-04-22

## 2016-03-04 NOTE — Telephone Encounter (Signed)
Patient notified

## 2016-03-04 NOTE — Telephone Encounter (Signed)
Patient of Dr. Bronson Ing. I reviewed the chart. It looks like he tolerated hydralazine at 25 mg 3 times a day based on the previous notes, but if he is having symptoms now see if he would cut it back to twice a day. If he still has symptoms, it may need to be discontinued altogether.

## 2016-03-04 NOTE — Telephone Encounter (Signed)
Thinks he is having reaction to the Hydralazine.  Was previously on the 50mg  & had similar issue with itching in April so Dr. Kathyrn Lass him to cut back to the 25mg  TID.  Did okay for a little while, but last 2-3 days is having the itching all over again & rash.  Started feeling a little nausea this morning.  No c/o SOB or chest pain.  BP has been running 130's / 70's.  In the meantime, advised that he can use OTC Benadryl for the itching and/or anti-itch cream till we get further advice from provider.

## 2016-03-15 ENCOUNTER — Ambulatory Visit (INDEPENDENT_AMBULATORY_CARE_PROVIDER_SITE_OTHER): Payer: Medicare Other | Admitting: *Deleted

## 2016-03-15 DIAGNOSIS — R600 Localized edema: Secondary | ICD-10-CM | POA: Diagnosis not present

## 2016-03-15 NOTE — Patient Instructions (Signed)
Pt was started on Eliquis 5mg  bid for Pulmonary embolus while in hospital in July 2016. Dose was decreased to 2.5mg  bid at OV 04/10/15 with Dr Bronson Ing due to age and renal function.  Pt denies problems taking Eliquis. He has not had any bleeding, excessive bruising or GI upset.   Reviewed patients medication list. Pt is not currently on any combined P-gp and strong CYP3A4 inhibitors/inducers (ketoconazole, traconazole, ritonavir, carbamazepine, phenytoin, rifampin, St. John's wort). Reviewed labs from 03/02/16. SCr 1.66, Weight 221, CrCl 49.48 SCr is slightly elevated at 1.66 .  Was 1.52 last check 11/12/15. Will leave pt on 2.5mg  bid for 3 more months and re-evaluate SrCr at that time.. Dose is appropriate based on 2 out of 3 criteria (age,wt,SrCr). Hgb and HCT 01/08/16:  11.9/37.9   A full discussion of the nature of anticoagulants has been carried out. A benefit/risk analysis has been presented to the patient, so that they understand the justification for choosing anticoagulation with Eliquis at this time. The need for compliance is stressed. Pt is aware to take the medication twice daily. Side effects of potential bleeding are discussed, including unusual colored urine or stools, coughing up blood or coffee ground emesis, nose bleeds or serious fall or head trauma. Discussed signs and symptoms of stroke. The patient should avoid any OTC items containing aspirin or ibuprofen. Avoid alcohol consumption. Call if any signs of abnormal bleeding. Discussed financial obligations and resolved any difficulty in obtaining medication..    Discussed lab results with pt.  Kidney function stable. Follow up in 3 months. Appt made.

## 2016-03-29 ENCOUNTER — Other Ambulatory Visit: Payer: Self-pay | Admitting: Cardiovascular Disease

## 2016-04-05 ENCOUNTER — Ambulatory Visit: Payer: Medicare Other | Admitting: Cardiovascular Disease

## 2016-04-20 DIAGNOSIS — R600 Localized edema: Secondary | ICD-10-CM | POA: Diagnosis not present

## 2016-04-20 DIAGNOSIS — I83819 Varicose veins of unspecified lower extremities with pain: Secondary | ICD-10-CM | POA: Diagnosis not present

## 2016-04-20 DIAGNOSIS — E1122 Type 2 diabetes mellitus with diabetic chronic kidney disease: Secondary | ICD-10-CM | POA: Diagnosis not present

## 2016-04-20 DIAGNOSIS — N183 Chronic kidney disease, stage 3 (moderate): Secondary | ICD-10-CM | POA: Diagnosis not present

## 2016-04-20 DIAGNOSIS — I10 Essential (primary) hypertension: Secondary | ICD-10-CM | POA: Diagnosis not present

## 2016-04-21 DIAGNOSIS — R609 Edema, unspecified: Secondary | ICD-10-CM | POA: Diagnosis not present

## 2016-04-21 DIAGNOSIS — I509 Heart failure, unspecified: Secondary | ICD-10-CM | POA: Diagnosis not present

## 2016-04-21 DIAGNOSIS — N08 Glomerular disorders in diseases classified elsewhere: Secondary | ICD-10-CM | POA: Diagnosis not present

## 2016-04-21 DIAGNOSIS — E1140 Type 2 diabetes mellitus with diabetic neuropathy, unspecified: Secondary | ICD-10-CM | POA: Diagnosis not present

## 2016-04-22 ENCOUNTER — Encounter (HOSPITAL_COMMUNITY): Payer: Self-pay | Admitting: Emergency Medicine

## 2016-04-22 ENCOUNTER — Emergency Department (HOSPITAL_COMMUNITY): Payer: Medicare Other

## 2016-04-22 ENCOUNTER — Emergency Department (HOSPITAL_COMMUNITY)
Admission: EM | Admit: 2016-04-22 | Discharge: 2016-04-22 | Disposition: A | Discharge status: Home / Self Care | Payer: Medicare Other | Attending: Emergency Medicine | Admitting: Emergency Medicine

## 2016-04-22 DIAGNOSIS — E1122 Type 2 diabetes mellitus with diabetic chronic kidney disease: Secondary | ICD-10-CM | POA: Diagnosis not present

## 2016-04-22 DIAGNOSIS — I130 Hypertensive heart and chronic kidney disease with heart failure and stage 1 through stage 4 chronic kidney disease, or unspecified chronic kidney disease: Secondary | ICD-10-CM | POA: Diagnosis not present

## 2016-04-22 DIAGNOSIS — I5043 Acute on chronic combined systolic (congestive) and diastolic (congestive) heart failure: Secondary | ICD-10-CM | POA: Diagnosis not present

## 2016-04-22 DIAGNOSIS — Z79899 Other long term (current) drug therapy: Secondary | ICD-10-CM | POA: Diagnosis not present

## 2016-04-22 DIAGNOSIS — N184 Chronic kidney disease, stage 4 (severe): Secondary | ICD-10-CM | POA: Diagnosis not present

## 2016-04-22 DIAGNOSIS — I2510 Atherosclerotic heart disease of native coronary artery without angina pectoris: Secondary | ICD-10-CM | POA: Insufficient documentation

## 2016-04-22 DIAGNOSIS — Z7982 Long term (current) use of aspirin: Secondary | ICD-10-CM | POA: Insufficient documentation

## 2016-04-22 DIAGNOSIS — M7989 Other specified soft tissue disorders: Secondary | ICD-10-CM | POA: Diagnosis present

## 2016-04-22 DIAGNOSIS — R600 Localized edema: Secondary | ICD-10-CM | POA: Diagnosis not present

## 2016-04-22 DIAGNOSIS — E8770 Fluid overload, unspecified: Secondary | ICD-10-CM | POA: Diagnosis not present

## 2016-04-22 LAB — BASIC METABOLIC PANEL
Anion gap: 7 (ref 5–15)
BUN: 43 mg/dL — AB (ref 6–20)
CALCIUM: 9.6 mg/dL (ref 8.9–10.3)
CHLORIDE: 107 mmol/L (ref 101–111)
CO2: 27 mmol/L (ref 22–32)
CREATININE: 1.8 mg/dL — AB (ref 0.61–1.24)
GFR calc non Af Amer: 34 mL/min — ABNORMAL LOW (ref >60)
GFR, EST AFRICAN AMERICAN: 39 mL/min — AB (ref >60)
GLUCOSE: 228 mg/dL — AB (ref 65–99)
Potassium: 4.4 mmol/L (ref 3.5–5.1)
Sodium: 141 mmol/L (ref 135–145)

## 2016-04-22 LAB — CBC
HEMATOCRIT: 36.4 % — AB (ref 39.0–52.0)
HEMOGLOBIN: 11.3 g/dL — AB (ref 13.0–17.0)
MCH: 29.3 pg (ref 26.0–34.0)
MCHC: 31 g/dL (ref 30.0–36.0)
MCV: 94.3 fL (ref 78.0–100.0)
Platelets: 138 10*3/uL — ABNORMAL LOW (ref 150–400)
RBC: 3.86 MIL/uL — ABNORMAL LOW (ref 4.22–5.81)
RDW: 15.3 % (ref 11.5–15.5)
WBC: 7 10*3/uL (ref 4.0–10.5)

## 2016-04-22 LAB — TROPONIN I: Troponin I: <0.03 ng/mL (ref <0.03)

## 2016-04-22 MED ORDER — FUROSEMIDE 10 MG/ML IJ SOLN
60.0000 mg | Freq: Once | INTRAMUSCULAR | Status: AC
  Administered 2016-04-22: 60 mg via INTRAVENOUS
  Filled 2016-04-22: qty 6

## 2016-04-22 NOTE — ED Notes (Signed)
condomn catheter applied.

## 2016-04-22 NOTE — ED Triage Notes (Signed)
Patient complains of left leg pain and swelling. Patient states he went to Dr. A[facility] and was told they couldn't do anything for him and sent him here. Patient states drainage from leg that started last Sunday.

## 2016-04-22 NOTE — ED Provider Notes (Signed)
[facility] DEPT Provider Note   CSN: [id number] Arrival date & time: 04/22/16  1116  By signing my name below, I, Rayna Sexton, attest that this documentation has been prepared under the direction and in the presence of Jola Schmidt, MD. Electronically Signed: Rayna Sexton, ED Scribe. 04/22/16. 12:43 PM.   History   Chief Complaint Chief Complaint  Patient presents with  . Leg Swelling    HPI HPI Comments: Zachary Mcmahon is a 82 y.o. male with a h/o DVT/PE and CHF who presents to the Emergency Department complaining of worsening, moderate, bilateral, left greater than right, leg swelling x 5 days. Pt states his symptoms have progressively worsened and notes he has gained about 5 lbs since the onset of his symptoms. Pt reports associated bilateral lower leg pain but denies any pain above the knees or other regions of pain. He was taking 30 mg lasix twice daily and increased it to 40 mg twice daily 3 days ago w/o relief. He states he also elevates his legs. Pt is currently taking Eliquis. He denies SOB and CP.   The history is provided by the patient. No language interpreter was used.    Past Medical History:  Diagnosis Date  . Arthritis    "legs" (02/07/2015)  . Basal cell carcinoma of forehead 2016 X 2  . Basal cell carcinoma of left earlobe   . CHF (congestive heart failure) ([place])   . Diabetic peripheral neuropathy ([place])    "left foot" (02/07/2015)  . GERD (gastroesophageal reflux disease)   . High potassium   . History of gout X 1  . Hyperlipidemia   . Hypertension   . Old myocardial infarct    "sometime in the past; don't know when" (02/07/2015)  . Pain and swelling of left lower leg    chronic  . Pneumonia X 1  . Renal insufficiency   . Respiratory failure ([place])   . Type II diabetes mellitus ([place])   . Varicose veins     Patient Active Problem List   Diagnosis Date Noted  . Acute on chronic combined systolic and diastolic CHF (congestive heart failure) ([place])  10/03/2015  . Chronic anticoagulation 10/03/2015  . CKD (chronic kidney disease), stage III 10/03/2015  . Chronic kidney disease (CKD), stage IV (severe) ([place]) 03/05/2015  . Acute pulmonary embolism ([place]) 03/04/2015  . Pulmonary embolism ([place]) 03/03/2015  . 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 Pressure ulcer 02/14/2015  . CHF (congestive heart failure) ([place])   . Renal insufficiency   . Acute respiratory failure with hypoxia ([place]) 02/12/2015  . Arterial hypotension   . Pulmonary edema   . Acute on chronic combined systolic and diastolic HF (heart failure) ([place])   . Abdominal distension   . Acute renal failure superimposed on stage 3 chronic kidney disease ([place])   . NSTEMI (non-ST elevated myocardial infarction) ([place])   . Non-STEMI (non-ST elevated myocardial infarction) ([place]) 02/07/2015  . Unstable angina ([place]) 02/06/2015  . CKD (chronic kidney disease) stage 3, GFR 30-59 ml/min 02/06/2015  . Chest pain 02/06/2015  . Abdominal pain, acute, generalized 02/06/2015  . Diarrhea 02/06/2015  . Swelling of extremity, left, chronic 02/06/2015  . Ileus ([place]) 02/06/2015  . Hyperlipidemia 04/23/2014  . Coronary artery disease 04/23/2014  . Varicose veins of lower extremities with other complications [id number]  . Unspecified hemorrhoids with other complication [id number]  . Intertrochanteric fracture of right hip ([place]) 03/20/2012  . HTN (hypertension), malignant 03/20/2012  . DM type 2 (diabetes mellitus,  type 2) ([place]) 03/20/2012  . Acute renal failure ([place]) 03/20/2012  . Thrombocytopenia ([place]) 03/20/2012  . Hyperkalemia 03/20/2012    Past Surgical History:  Procedure Laterality Date  . BASAL CELL CARCINOMA EXCISION     "probably 1/2 dozen cut off face, left ear" (02/07/2015)  . CARDIAC CATHETERIZATION N/A 02/09/2015   Procedure: Left Heart Cath and Coronary Angiography;  Surgeon: Leonie Man, MD;  Location: [facility];  Service: Cardiovascular;  Laterality: N/A;  . CARDIOVASCULAR  STRESS TEST  05/29/2012   Mild-moderate perfusion defect due to infarct/scar with mild-moderate perinfarct ischemia seen in the mid anterior, apical anterior, apical septal, and apical regions. No ECG changes. Global LV systolic function is severely reduced.  Marland Kitchen CATARACT EXTRACTION W/PHACO Left 11/12/2015   Procedure: CATARACT EXTRACTION PHACO AND INTRAOCULAR LENS PLACEMENT LEFT EYE;  Surgeon: Tonny Branch, MD;  Location: [facility];  Service: Ophthalmology;  Laterality: Left;  CDE: 9.82  . CATARACT EXTRACTION W/PHACO Right 12/14/2015   Procedure: CATARACT EXTRACTION PHACO AND INTRAOCULAR LENS PLACEMENT (IOC);  Surgeon: Tonny Branch, MD;  Location: [facility];  Service: Ophthalmology;  Laterality: Right;  CDE: 13.86  . CORONARY ARTERY BYPASS GRAFT N/A 02/17/2015   Procedure: CORONARY ARTERY BYPASS GRAFTING (CABG), ON PUMP, TIMES FIVE, USING LEFT INTERNAL MAMMARY ARTERY, RIGHT GREATER SAPHENOUS VEIN HARVESTED ENDOSCOPICALLY;  Surgeon: [name], MD;  Location: [facility];  Service: Open Heart Surgery;  Laterality: N/A;  -LIMA to LAD -SVG to DIAGONAL - SEQ SVG to OM1 and PLB -SVG to PDA  . CYSTOSCOPY W/ STONE MANIPULATION  X 1  . FRACTURE SURGERY    . KNEE ARTHROSCOPY Right ~ 2008  . LEXISCAM MYOCARDIAL PERFUSION  05/29/12   MARKED PERFUSION DEFECT DUE TO INFARC/SCAR WITH MILD PERINFARCT ISCHEMIA IN THE BASAL INFERIOR, MID INFEROSEPTAL, MID INFERIOR AND APICALINFERIOR REGION. EF%33%. PERIINFARCT ISCHEMIA IN THE MID ANTERIOR, APICAL ANTERIOR, APICAL SEPTAL AND APICAL REGIONS.  . LOWER EXTREMITY VENOUS DOPPLER  07/11/2012   No evidence of DVT in the left lower extremity. Evidence of partially recanalized, chronic, non-obstructive thrombus in the left great SV and its branches consistent with significant reflux consistent with post-phlebitic syndrome. Significant reflux of the left short saphenous vein.  Marland Kitchen open heart sx  02/17/15  . ORIF HIP FRACTURE  03/20/2012   Procedure: OPEN REDUCTION INTERNAL FIXATION HIP;   Surgeon: Sanjuana Kava, MD;  Location: [facility];  Service: Orthopedics;  Laterality: Right;  . TEE WITHOUT CARDIOVERSION N/A 02/17/2015   Procedure: TRANSESOPHAGEAL ECHOCARDIOGRAM (TEE);  Surgeon: [name], MD;  Location: [facility];  Service: Open Heart Surgery;  Laterality: N/A;  . TRANSTHORACIC ECHOCARDIOGRAM  04/18/2012   EF 45%, mild-moderate LVH  . TRANSTHORACIC ECHOCARDIOGRAM  04/18/12   EF% 45%.SEVERE HYPOKINESIS TO AKINESIS OF THE MID-DISTAL INFEROLATERAL MYOCARDIUM AND MUCH OF THE APEX.     Home Medications    Prior to Admission medications   Medication Sig Start Date End Date Taking? Authorizing Provider  acetaminophen (TYLENOL) 500 MG tablet Take 500 mg by mouth every 6 (six) hours as needed for mild pain.    Historical Provider, MD  apixaban (ELIQUIS) 2.5 MG TABS tablet Take 1 tablet (2.5 mg total) by mouth 2 (two) times daily. 01/22/16   Herminio Commons, MD  aspirin EC 81 MG tablet Take 81 mg by mouth every morning.    Historical Provider, MD  atorvastatin (LIPITOR) 20 MG tablet TAKE 1 TABLET BY MOUTH DAILY AT 6:00 P.M. ( MUST SCHEDULE AN APPOINTMENT FOR FUTURE  REFILLS ) Patient taking differently: TAKE 1 TABLET BY MOUTH DAILY AT 6:00 P.M. 03/27/14   Lorretta Harp, MD  carvedilol (COREG) 6.25 MG tablet Take 1 tablet (6.25 mg total) by mouth 2 (two) times daily. 12/02/15   Herminio Commons, MD  diclofenac sodium (VOLTAREN) 1 % GEL Apply 2 g topically 2 (two) times daily.    Historical Provider, MD  Difluprednate (DUREZOL) 0.05 % EMUL Apply 1 drop to eye daily.    Historical Provider, MD  ferrous sulfate 325 (65 FE) MG tablet Take 1 tablet (325 mg total) by mouth daily with breakfast. 02/24/15   Donielle Liston Alba, PA-C  gabapentin (NEURONTIN) 100 MG capsule Take 100 mg by mouth 3 (three) times daily.  04/01/14   Historical Provider, MD  hydrALAZINE (APRESOLINE) 50 MG tablet Take 0.5 tablets (25 mg total) by mouth 2 (two) times daily. 03/04/16   Herminio Commons, MD    hydrALAZINE (APRESOLINE) 50 MG tablet Take 0.5 tablets (25 mg total) by mouth 2 (two) times daily. 03/29/16   Herminio Commons, MD  LEVEMIR FLEXTOUCH 100 UNIT/ML Pen Inject 27 Units into the skin at bedtime. Patient taking differently: Inject 36 Units into the skin at bedtime.  03/05/15   Radene Gunning, NP  LORazepam (ATIVAN) 1 MG tablet Take 1 tablet (1 mg total) by mouth at bedtime as needed for sleep. Patient taking differently: Take 1 mg by mouth at bedtime as needed for anxiety or sleep.  03/05/15   Lauree Chandler, NP  losartan (COZAAR) 100 MG tablet Take 100 mg by mouth daily.    Historical Provider, MD  minocycline (MINOCIN,DYNACIN) 100 MG capsule Take 100 mg by mouth 2 (two) times daily. 10 day course starting on 12/26/2015    Historical Provider, MD  omeprazole (PRILOSEC) 20 MG capsule Take 20 mg by mouth daily.    Historical Provider, MD  torsemide (DEMADEX) 20 MG tablet Take 1 tablet (20 mg total) by mouth 2 (two) times daily. 10/06/15   Kathie Dike, MD  torsemide (DEMADEX) 20 MG tablet Take 2 tablets (40 mg total) by mouth 2 (two) times daily. ( X 3 DAYS ONLY ) 02/24/16   Herminio Commons, MD  traMADol (ULTRAM) 50 MG tablet Take 1 tablet (50 mg total) by mouth every 6 (six) hours as needed for moderate pain. 03/05/15   Lauree Chandler, NP    Family History Family History  Problem Relation Age of Onset  . Deep vein thrombosis Mother   . Hypertension Mother   . Hypertension Sister   . Diabetes Brother   . Hyperlipidemia Brother   . Hypertension Brother   . Heart attack Brother     Social History Social History  Substance Use Topics  . Smoking status: Never Smoker  . Smokeless tobacco: Never Used  . Alcohol use No     Allergies   Bactrim [sulfamethoxazole-trimethoprim]   Review of Systems Review of Systems A complete 10 system review of systems was obtained and all systems are negative except as noted in the HPI and PMH.    Physical Exam Updated Vital  Signs BP 151/72 (BP Location: Left Arm)   Pulse 69   Temp 98.1 F (36.7 C) (Oral)   Resp 18   Ht 5\' 9"  (1.753 m)   Wt 216 lb (98 kg)   SpO2 96%   BMI 31.90 kg/m   Physical Exam  Constitutional: He is oriented to person, place, and time. He appears well-developed and  well-nourished.  HENT:  Head: Normocephalic and atraumatic.  Eyes: EOM are normal.  Neck: Normal range of motion.  Cardiovascular: Normal rate, regular rhythm, normal heart sounds and intact distal pulses.   Pulmonary/Chest: Effort normal and breath sounds normal. No respiratory distress.  Abdominal: Soft. He exhibits no distension. There is no tenderness.  Musculoskeletal: Normal range of motion. He exhibits edema.  2+ edema bilaterally with left being greater than right  Neurological: He is alert and oriented to person, place, and time.  Skin: Skin is warm and dry.  Psychiatric: He has a normal mood and affect. Judgment normal.  Nursing note and vitals reviewed.  ED Treatments / Results  Labs (all labs ordered are listed, but only abnormal results are displayed) Labs Reviewed  CBC - Abnormal; Notable for the following:       Result Value   RBC 3.86 (*)    Hemoglobin 11.3 (*)    HCT 36.4 (*)    Platelets 138 (*)    All other components within normal limits  BASIC METABOLIC PANEL - Abnormal; Notable for the following:    Glucose, Bld 228 (*)    BUN 43 (*)    Creatinine, Ser 1.80 (*)    GFR calc non Af Amer 34 (*)    GFR calc Af Amer 39 (*)    All other components within normal limits  TROPONIN I   BUN  Date Value Ref Range Status  04/22/2016 43 (H) 6 - 20 mg/dL Final  01/08/2016 37 (H) 6 - 20 mg/dL Final  11/12/2015 32 (H) 6 - 20 mg/dL Final  10/06/2015 42 (H) 6 - 20 mg/dL Final   Creat  Date Value Ref Range Status  05/15/2015 1.45 (H) 0.70 - 1.11 mg/dL Final   Creatinine, Ser  Date Value Ref Range Status  04/22/2016 1.80 (H) 0.61 - 1.24 mg/dL Final  01/08/2016 1.72 (H) 0.61 - 1.24 mg/dL Final   11/12/2015 1.52 (H) 0.61 - 1.24 mg/dL Final  10/06/2015 1.95 (H) 0.61 - 1.24 mg/dL Final      EKG  EKG Interpretation  Date/Time:  Friday April 22 2016 12:05:18 EDT Ventricular Rate:  64 PR Interval:    QRS Duration: 85 QT Interval:  398 QTC Calculation: 411 R Axis:   83 Text Interpretation:  Sinus arrhythmia Multiple premature complexes, vent & supraven Probable anterior infarct, age indeterminate No significant change was found Confirmed by Jaray Boliver  MD, Lennette Bihari ([id number]) on 04/22/2016 2:47:30 PM       Radiology US Venous Img Lower Bilateral  Result Date: 04/22/2016 CLINICAL DATA:  Bilateral lower extremity pain and edema, left greater than right. History of prior DVT and pulmonary embolism. History of basal cell carcinoma. Evaluate for acute or chronic DVT. EXAM: BILATERAL LOWER EXTREMITY VENOUS DOPPLER ULTRASOUND TECHNIQUE: Gray-scale sonography with graded compression, as well as color Doppler and duplex ultrasound were performed to evaluate the lower extremity deep venous systems from the level of the common femoral vein and including the common femoral, femoral, profunda femoral, popliteal and calf veins including the posterior tibial, peroneal and gastrocnemius veins when visible. The superficial great saphenous vein was also interrogated. Spectral Doppler was utilized to evaluate flow at rest and with distal augmentation maneuvers in the common femoral, femoral and popliteal veins. COMPARISON:  None. FINDINGS: RIGHT LOWER EXTREMITY Common Femoral Vein: No evidence of thrombus. Normal compressibility, respiratory phasicity and response to augmentation. Saphenofemoral Junction: No evidence of thrombus. Normal compressibility and flow on color Doppler imaging. Profunda Femoral Vein:  No evidence of thrombus. Normal compressibility and flow on color Doppler imaging. Femoral Vein: No evidence of thrombus. Normal compressibility, respiratory phasicity and response to augmentation. Popliteal  Vein: No evidence of thrombus. Normal compressibility, respiratory phasicity and response to augmentation. Calf Veins: No evidence of thrombus. Normal compressibility and flow on color Doppler imaging. Superficial Great Saphenous Vein: No evidence of thrombus. Normal compressibility and flow on color Doppler imaging. Venous Reflux:  None. Other Findings: A minimal amount of subcutaneous edema is noted at the level the right calf and lower leg. LEFT LOWER EXTREMITY Common Femoral Vein: No evidence of thrombus. Normal compressibility, respiratory phasicity and response to augmentation. Saphenofemoral Junction: No evidence of thrombus. Normal compressibility and flow on color Doppler imaging. Profunda Femoral Vein: No evidence of thrombus. Normal compressibility and flow on color Doppler imaging. Femoral Vein: No evidence of thrombus. Normal compressibility, respiratory phasicity and response to augmentation. Popliteal Vein: No evidence of thrombus. Normal compressibility, respiratory phasicity and response to augmentation. Calf Veins: No evidence of thrombus. Normal compressibility and flow on color Doppler imaging. Superficial Great Saphenous Vein: No evidence of thrombus. Normal compressibility and flow on color Doppler imaging. Venous Reflux:  None. Other Findings: A minimal to moderate amount of subcutaneous edema is noted at the level of the left lower leg and calf. IMPRESSION: No evidence of acute or chronic DVT within either lower extremity. Electronically Signed   By: Sandi Mariscal M.D.   On: 04/22/2016 14:12    Procedures Procedures  DIAGNOSTIC STUDIES: Oxygen Saturation is 96% on RA, normal by my interpretation.    COORDINATION OF CARE: 12:43 PM Discussed next steps with pt. Pt verbalized understanding and is agreeable with the plan.    Medications Ordered in ED Medications  furosemide (LASIX) injection 60 mg (60 mg Intravenous Given 04/22/16 1258)     Initial Impression / Assessment and Plan /  ED Course  I have reviewed the triage vital signs and the nursing notes.  Pertinent labs & imaging results that were available during my care of the patient were reviewed by me and considered in my medical decision making (see chart for details).  Clinical Course    Baseline renal insufficiency.  Lower extremity swelling.  Bilateral lower extremity duplex negative for DVT.  No chest pain or shortness of breath to suggest any pulmonary of congestive heart failure.  He has volume overload as noted by his weight gain and his lower extremity edema.  Patient was given IV diuretic in the emergency department and urinated almost 2 L of urine.  He will go home on 40 mg by mouth twice a day.  I've asked that he follow-up with his cardiologist and primary care doctor next week.  He understands to return to the ER for new or worsening symptoms.   I personally performed the services described in this documentation, which was scribed in my presence. The recorded information has been reviewed and is accurate.      Final Clinical Impressions(s) / ED Diagnoses   Final diagnoses:  Hypervolemia, unspecified hypervolemia type  Bilateral edema of lower extremity    New Prescriptions Discharge Medication List as of 04/22/2016  3:09 PM       Jola Schmidt, MD 04/22/16 1730

## 2016-04-27 ENCOUNTER — Telehealth: Payer: Self-pay | Admitting: Cardiovascular Disease

## 2016-04-27 DIAGNOSIS — I130 Hypertensive heart and chronic kidney disease with heart failure and stage 1 through stage 4 chronic kidney disease, or unspecified chronic kidney disease: Secondary | ICD-10-CM | POA: Diagnosis not present

## 2016-04-27 DIAGNOSIS — L97821 Non-pressure chronic ulcer of other part of left lower leg limited to breakdown of skin: Secondary | ICD-10-CM | POA: Diagnosis not present

## 2016-04-27 DIAGNOSIS — I83018 Varicose veins of right lower extremity with ulcer other part of lower leg: Secondary | ICD-10-CM | POA: Diagnosis not present

## 2016-04-27 DIAGNOSIS — Z794 Long term (current) use of insulin: Secondary | ICD-10-CM | POA: Diagnosis not present

## 2016-04-27 DIAGNOSIS — I2510 Atherosclerotic heart disease of native coronary artery without angina pectoris: Secondary | ICD-10-CM | POA: Diagnosis not present

## 2016-04-27 DIAGNOSIS — Z7982 Long term (current) use of aspirin: Secondary | ICD-10-CM | POA: Diagnosis not present

## 2016-04-27 DIAGNOSIS — L03116 Cellulitis of left lower limb: Secondary | ICD-10-CM | POA: Diagnosis not present

## 2016-04-27 DIAGNOSIS — K219 Gastro-esophageal reflux disease without esophagitis: Secondary | ICD-10-CM | POA: Diagnosis not present

## 2016-04-27 DIAGNOSIS — L97819 Non-pressure chronic ulcer of other part of right lower leg with unspecified severity: Secondary | ICD-10-CM | POA: Diagnosis not present

## 2016-04-27 DIAGNOSIS — N183 Chronic kidney disease, stage 3 (moderate): Secondary | ICD-10-CM | POA: Diagnosis not present

## 2016-04-27 DIAGNOSIS — E7800 Pure hypercholesterolemia, unspecified: Secondary | ICD-10-CM | POA: Diagnosis not present

## 2016-04-27 DIAGNOSIS — E1122 Type 2 diabetes mellitus with diabetic chronic kidney disease: Secondary | ICD-10-CM | POA: Diagnosis not present

## 2016-04-27 DIAGNOSIS — Z79899 Other long term (current) drug therapy: Secondary | ICD-10-CM | POA: Diagnosis not present

## 2016-04-27 DIAGNOSIS — Z7901 Long term (current) use of anticoagulants: Secondary | ICD-10-CM | POA: Diagnosis not present

## 2016-04-27 DIAGNOSIS — I83028 Varicose veins of left lower extremity with ulcer other part of lower leg: Secondary | ICD-10-CM | POA: Diagnosis not present

## 2016-04-27 DIAGNOSIS — L97811 Non-pressure chronic ulcer of other part of right lower leg limited to breakdown of skin: Secondary | ICD-10-CM | POA: Diagnosis not present

## 2016-04-27 DIAGNOSIS — E1140 Type 2 diabetes mellitus with diabetic neuropathy, unspecified: Secondary | ICD-10-CM | POA: Diagnosis not present

## 2016-04-27 DIAGNOSIS — L97829 Non-pressure chronic ulcer of other part of left lower leg with unspecified severity: Secondary | ICD-10-CM | POA: Diagnosis not present

## 2016-04-27 DIAGNOSIS — E785 Hyperlipidemia, unspecified: Secondary | ICD-10-CM | POA: Diagnosis not present

## 2016-04-27 DIAGNOSIS — I509 Heart failure, unspecified: Secondary | ICD-10-CM | POA: Diagnosis not present

## 2016-04-27 DIAGNOSIS — I872 Venous insufficiency (chronic) (peripheral): Secondary | ICD-10-CM | POA: Diagnosis not present

## 2016-04-27 NOTE — Telephone Encounter (Signed)
DR Franklin told patient that blood thinner is causing him to break out in ulcers on legs

## 2016-04-27 NOTE — Telephone Encounter (Signed)
Per Vicente Males at [place] [facility] office, patient misunderstood what was told to him today in regards to his blood thinner. Per Vicente Males, patient may have been told that he may have poor circulation that could contribute to ulcers but that blood thinner could cause more bleeding. Per Vicente Males, she will speak with Dr. Nils Pyle tomorrow to get clarification and also fax the office note over whenever its available for further clarification.  Patient informed and verbalized understanding of plan.

## 2016-04-29 DIAGNOSIS — E785 Hyperlipidemia, unspecified: Secondary | ICD-10-CM | POA: Diagnosis not present

## 2016-04-29 DIAGNOSIS — E1122 Type 2 diabetes mellitus with diabetic chronic kidney disease: Secondary | ICD-10-CM | POA: Diagnosis not present

## 2016-04-29 DIAGNOSIS — Z7982 Long term (current) use of aspirin: Secondary | ICD-10-CM | POA: Diagnosis not present

## 2016-04-29 DIAGNOSIS — L97819 Non-pressure chronic ulcer of other part of right lower leg with unspecified severity: Secondary | ICD-10-CM | POA: Diagnosis not present

## 2016-04-29 DIAGNOSIS — I70212 Atherosclerosis of native arteries of extremities with intermittent claudication, left leg: Secondary | ICD-10-CM | POA: Diagnosis not present

## 2016-04-29 DIAGNOSIS — I83018 Varicose veins of right lower extremity with ulcer other part of lower leg: Secondary | ICD-10-CM | POA: Diagnosis not present

## 2016-04-29 DIAGNOSIS — E7800 Pure hypercholesterolemia, unspecified: Secondary | ICD-10-CM | POA: Diagnosis not present

## 2016-04-29 DIAGNOSIS — N183 Chronic kidney disease, stage 3 (moderate): Secondary | ICD-10-CM | POA: Diagnosis not present

## 2016-04-29 DIAGNOSIS — I509 Heart failure, unspecified: Secondary | ICD-10-CM | POA: Diagnosis not present

## 2016-04-29 DIAGNOSIS — I83028 Varicose veins of left lower extremity with ulcer other part of lower leg: Secondary | ICD-10-CM | POA: Diagnosis not present

## 2016-04-29 DIAGNOSIS — I872 Venous insufficiency (chronic) (peripheral): Secondary | ICD-10-CM | POA: Diagnosis not present

## 2016-04-29 DIAGNOSIS — I130 Hypertensive heart and chronic kidney disease with heart failure and stage 1 through stage 4 chronic kidney disease, or unspecified chronic kidney disease: Secondary | ICD-10-CM | POA: Diagnosis not present

## 2016-04-29 DIAGNOSIS — K219 Gastro-esophageal reflux disease without esophagitis: Secondary | ICD-10-CM | POA: Diagnosis not present

## 2016-04-29 DIAGNOSIS — Z794 Long term (current) use of insulin: Secondary | ICD-10-CM | POA: Diagnosis not present

## 2016-04-29 DIAGNOSIS — I2510 Atherosclerotic heart disease of native coronary artery without angina pectoris: Secondary | ICD-10-CM | POA: Diagnosis not present

## 2016-04-29 DIAGNOSIS — L03116 Cellulitis of left lower limb: Secondary | ICD-10-CM | POA: Diagnosis not present

## 2016-04-29 DIAGNOSIS — E1140 Type 2 diabetes mellitus with diabetic neuropathy, unspecified: Secondary | ICD-10-CM | POA: Diagnosis not present

## 2016-04-29 DIAGNOSIS — Z7901 Long term (current) use of anticoagulants: Secondary | ICD-10-CM | POA: Diagnosis not present

## 2016-04-29 DIAGNOSIS — Z79899 Other long term (current) drug therapy: Secondary | ICD-10-CM | POA: Diagnosis not present

## 2016-04-29 DIAGNOSIS — L97829 Non-pressure chronic ulcer of other part of left lower leg with unspecified severity: Secondary | ICD-10-CM | POA: Diagnosis not present

## 2016-04-29 NOTE — Telephone Encounter (Signed)
Notes received from the [facility].  Explained to patient that Dr. Nils Pyle did not mention in his note that the blood thinner was the cause of his leg ulcers.  It does state chronic venous insufficinecy with stasis ulcers.  Patient does have OV on 05/03/16 with Dr. Bronson Ing already scheduled.  Dr. Raliegh Ip will review notes from Dr. Nils Pyle & can discuss further with him at [facility].  Patient verbalized understanding.

## 2016-05-03 ENCOUNTER — Ambulatory Visit (INDEPENDENT_AMBULATORY_CARE_PROVIDER_SITE_OTHER): Payer: Medicare Other | Admitting: Cardiovascular Disease

## 2016-05-03 ENCOUNTER — Encounter: Payer: Self-pay | Admitting: Cardiovascular Disease

## 2016-05-03 VITALS — BP 150/90 | HR 94 | Ht 69.0 in | Wt 215.0 lb

## 2016-05-03 DIAGNOSIS — N183 Chronic kidney disease, stage 3 (moderate): Secondary | ICD-10-CM

## 2016-05-03 DIAGNOSIS — I25768 Atherosclerosis of bypass graft of coronary artery of transplanted heart with other forms of angina pectoris: Secondary | ICD-10-CM

## 2016-05-03 DIAGNOSIS — I5042 Chronic combined systolic (congestive) and diastolic (congestive) heart failure: Secondary | ICD-10-CM

## 2016-05-03 DIAGNOSIS — I429 Cardiomyopathy, unspecified: Secondary | ICD-10-CM

## 2016-05-03 DIAGNOSIS — R9439 Abnormal result of other cardiovascular function study: Secondary | ICD-10-CM

## 2016-05-03 DIAGNOSIS — I2782 Chronic pulmonary embolism: Secondary | ICD-10-CM

## 2016-05-03 DIAGNOSIS — L97909 Non-pressure chronic ulcer of unspecified part of unspecified lower leg with unspecified severity: Secondary | ICD-10-CM

## 2016-05-03 DIAGNOSIS — I83009 Varicose veins of unspecified lower extremity with ulcer of unspecified site: Secondary | ICD-10-CM

## 2016-05-03 DIAGNOSIS — R600 Localized edema: Secondary | ICD-10-CM

## 2016-05-03 DIAGNOSIS — I10 Essential (primary) hypertension: Secondary | ICD-10-CM

## 2016-05-03 DIAGNOSIS — R931 Abnormal findings on diagnostic imaging of heart and coronary circulation: Secondary | ICD-10-CM

## 2016-05-03 MED ORDER — APIXABAN 2.5 MG PO TABS: 2.5000 mg | ORAL_TABLET | Freq: Two times a day (BID) | ORAL | 0 refills | Status: DC

## 2016-05-03 MED ORDER — FUROSEMIDE 40 MG PO TABS: ORAL_TABLET | ORAL | Status: DC

## 2016-05-03 NOTE — Patient Instructions (Signed)
Medication Instructions:   Eliquis samples provided today.  Continue all other medications.    Labwork: none  Testing/Procedures: none  Follow-Up: Your physician wants you to follow up in: 6 months.  You will receive a reminder letter in the mail one-two months in advance.  If you don't receive a letter, please call our office to schedule the follow up appointment   Any Other Special Instructions Will Be Listed Below (If Applicable).  If you need a refill on your cardiac medications before your next appointment, please call your pharmacy.

## 2016-05-03 NOTE — Progress Notes (Signed)
SUBJECTIVE:  The patient returns for follow-up of coronary artery disease and chronic systolic and diastolic heart failure. Nuclear stress test on 10/23/15 demonstrated a large defect involving the inferoseptal, inferior, and lateral wall extending from the apex to the base with some degree of reversibility suggestive of moderate lateral ischemia. It was deemed a high risk study.  Echocardiogram performed on 10/04/15 demonstrated mild to moderately reduced left ventricular systolic function, EF 76-19%, with inferior wall hypokinesis, grade 2 diastolic dysfunction , and mild to moderate mitral regurgitation.   He underwent 5 vessel coronary artery bypass graft surgery on 02/17/15. He has a history of pulmonary embolism and is anticoagulated with Eliquis. He also has chronic kidney disease stage III. The EF had dropped and had previously been normal , 50-55%, in July 2016.  Evaluated in ED 9/1 for b/l LE edema. Given Lasix. No signs of DVT by Dopplers. Has chronic venous insufficiency with stasis ulcers and is being treated by Dr. Nils Pyle at the [place] [facility].  Recently underwent ABI's, I do not have these results.  Legs are bandaged. Complains of left leg pain.  Taking Lasix 40 mg q am and 20 mg q pm.   Review of Systems: As per "subjective", otherwise negative.  Allergies  Allergen Reactions  . Bactrim [Sulfamethoxazole-Trimethoprim] Rash    Current Outpatient Prescriptions  Medication Sig Dispense Refill  . acetaminophen (TYLENOL) 500 MG tablet Take 500 mg by mouth every 6 (six) hours as needed for mild pain.    Marland Kitchen apixaban (ELIQUIS) 2.5 MG TABS tablet Take 1 tablet (2.5 mg total) by mouth 2 (two) times daily. 28 tablet 0  . aspirin EC 81 MG tablet Take 81 mg by mouth every morning.    Marland Kitchen atorvastatin (LIPITOR) 20 MG tablet TAKE 1 TABLET BY MOUTH DAILY AT 6:00 P.M. ( MUST SCHEDULE AN APPOINTMENT FOR FUTURE REFILLS ) (Patient taking differently: TAKE 1 TABLET BY MOUTH  DAILY AT 6:00 P.M.) 30 tablet 0  . carvedilol (COREG) 6.25 MG tablet Take 1 tablet (6.25 mg total) by mouth 2 (two) times daily. 180 tablet 3  . diclofenac sodium (VOLTAREN) 1 % GEL Apply 2 g topically 2 (two) times daily.    . ferrous sulfate 325 (65 FE) MG tablet Take 1 tablet (325 mg total) by mouth daily with breakfast.  3  . gabapentin (NEURONTIN) 100 MG capsule Take 100 mg by mouth 3 (three) times daily.     . hydrALAZINE (APRESOLINE) 50 MG tablet Take 0.5 tablets (25 mg total) by mouth 2 (two) times daily. 90 tablet 3  . LEVEMIR FLEXTOUCH 100 UNIT/ML Pen Inject 27 Units into the skin at bedtime. (Patient taking differently: Inject 34 Units into the skin at bedtime. )    . losartan (COZAAR) 100 MG tablet Take 100 mg by mouth daily.    Marland Kitchen omeprazole (PRILOSEC) 20 MG capsule Take 20 mg by mouth daily.    . traMADol (ULTRAM) 50 MG tablet Take 50 mg by mouth every 6 (six) hours as needed for moderate pain.     No current facility-administered medications for this visit.     Past Medical History:  Diagnosis Date  . Arthritis    "legs" (02/07/2015)  . Basal cell carcinoma of forehead 2016 X 2  . Basal cell carcinoma of left earlobe   . CHF (congestive heart failure) ([place])   . Diabetic peripheral neuropathy ([place])    "left foot" (02/07/2015)  . GERD (gastroesophageal reflux disease)   .  High potassium   . History of gout X 1  . Hyperlipidemia   . Hypertension   . Old myocardial infarct    "sometime in the past; don't know when" (02/07/2015)  . Pain and swelling of left lower leg    chronic  . Pneumonia X 1  . Renal insufficiency   . Respiratory failure ([place])   . Type II diabetes mellitus ([place])   . Varicose veins     Past Surgical History:  Procedure Laterality Date  . BASAL CELL CARCINOMA EXCISION     "probably 1/2 dozen cut off face, left ear" (02/07/2015)  . CARDIAC CATHETERIZATION N/A 02/09/2015   Procedure: Left Heart Cath and Coronary Angiography;  Surgeon: Leonie Man,  MD;  Location: [facility];  Service: Cardiovascular;  Laterality: N/A;  . CARDIOVASCULAR STRESS TEST  05/29/2012   Mild-moderate perfusion defect due to infarct/scar with mild-moderate perinfarct ischemia seen in the mid anterior, apical anterior, apical septal, and apical regions. No ECG changes. Global LV systolic function is severely reduced.  Marland Kitchen CATARACT EXTRACTION W/PHACO Left 11/12/2015   Procedure: CATARACT EXTRACTION PHACO AND INTRAOCULAR LENS PLACEMENT LEFT EYE;  Surgeon: Tonny Branch, MD;  Location: [facility];  Service: Ophthalmology;  Laterality: Left;  CDE: 9.82  . CATARACT EXTRACTION W/PHACO Right 12/14/2015   Procedure: CATARACT EXTRACTION PHACO AND INTRAOCULAR LENS PLACEMENT (IOC);  Surgeon: Tonny Branch, MD;  Location: [facility];  Service: Ophthalmology;  Laterality: Right;  CDE: 13.86  . CORONARY ARTERY BYPASS GRAFT N/A 02/17/2015   Procedure: CORONARY ARTERY BYPASS GRAFTING (CABG), ON PUMP, TIMES FIVE, USING LEFT INTERNAL MAMMARY ARTERY, RIGHT GREATER SAPHENOUS VEIN HARVESTED ENDOSCOPICALLY;  Surgeon: [name], MD;  Location: [place];  Service: Open Heart Surgery;  Laterality: N/A;  -LIMA to LAD -SVG to DIAGONAL - SEQ SVG to OM1 and PLB -SVG to PDA  . CYSTOSCOPY W/ STONE MANIPULATION  X 1  . FRACTURE SURGERY    . KNEE ARTHROSCOPY Right ~ 2008  . LEXISCAM MYOCARDIAL PERFUSION  05/29/12   MARKED PERFUSION DEFECT DUE TO INFARC/SCAR WITH MILD PERINFARCT ISCHEMIA IN THE BASAL INFERIOR, MID INFEROSEPTAL, MID INFERIOR AND APICALINFERIOR REGION. EF%33%. PERIINFARCT ISCHEMIA IN THE MID ANTERIOR, APICAL ANTERIOR, APICAL SEPTAL AND APICAL REGIONS.  . LOWER EXTREMITY VENOUS DOPPLER  07/11/2012   No evidence of DVT in the left lower extremity. Evidence of partially recanalized, chronic, non-obstructive thrombus in the left great SV and its branches consistent with significant reflux consistent with post-phlebitic syndrome. Significant reflux of the left short saphenous vein.  Marland Kitchen open heart sx   02/17/15  . ORIF HIP FRACTURE  03/20/2012   Procedure: OPEN REDUCTION INTERNAL FIXATION HIP;  Surgeon: Sanjuana Kava, MD;  Location: [facility];  Service: Orthopedics;  Laterality: Right;  . TEE WITHOUT CARDIOVERSION N/A 02/17/2015   Procedure: TRANSESOPHAGEAL ECHOCARDIOGRAM (TEE);  Surgeon: [name], MD;  Location: [place];  Service: Open Heart Surgery;  Laterality: N/A;  . TRANSTHORACIC ECHOCARDIOGRAM  04/18/2012   EF 45%, mild-moderate LVH  . TRANSTHORACIC ECHOCARDIOGRAM  04/18/12   EF% 45%.SEVERE HYPOKINESIS TO AKINESIS OF THE MID-DISTAL INFEROLATERAL MYOCARDIUM AND MUCH OF THE APEX.    Social History   Social History  . Marital status: Married    Spouse name: N/A  . Number of children: N/A  . Years of education: N/A   Occupational History  . Not on file.   Social History Main Topics  . Smoking status: Never Smoker  . Smokeless tobacco: Never Used  .  Alcohol use No  . Drug use: No  . Sexual activity: Not Currently   Other Topics Concern  . Not on file   Social History Narrative  . No narrative on file     Vitals:   05/03/16 0913  BP: (!) 150/90  Pulse: 94  SpO2: 98%  Weight: 215 lb (97.5 kg)  Height: 5\' 9"  (1.753 m)    PHYSICAL EXAM General: NAD HEENT: Normal. Neck: No JVD, no thyromegaly. Lungs: Clear to auscultation bilaterally with normal respiratory effort. CV: Nondisplaced PMI.  Regular rate and rhythm, normal S1/S2, no S3/S4, no murmur. Legs are bandaged to knees.   Abdomen: Soft, nontender, no distention.  Neurologic: Alert and oriented.  Psych: Normal affect.     ECG: Most recent ECG reviewed.      ASSESSMENT AND PLAN: 1. Chronic combined systolic and diastolic heart failure: Taking Lasix 40 mg q am and 20 mg q pm.  2. CAD s/p 5-v CABG: On ASA and Lipitor. Lexiscan Cardiolite indicative of ischemic etiology for EF reduction. Continue Coreg. We previously discussed coronary angiography again and he does not want to proceed at this  time.  3. Pulmonary embolism: Continue Eliquis.  4. Essential HTN: Elevated. In a lot of pain. Higher doses of hydralazine led to a rash. Will monitor.  5. Hyperlipidemia: Continue Lipitor.  6. Tachycardia: Resolved with Coreg.  7. Chronic venous stasis with ulceration: Being treated at [place] [facility]. Had a lengthy discussion explaining pathology to him.  Dispo: f/u 6 months  Time spent: 40 minutes, of which greater than 50% was spent reviewing symptoms, relevant blood tests and studies, and discussing management plan with the patient.   Kate Sable, M.D., F.A.C.C.

## 2016-05-04 DIAGNOSIS — E1140 Type 2 diabetes mellitus with diabetic neuropathy, unspecified: Secondary | ICD-10-CM | POA: Diagnosis not present

## 2016-05-04 DIAGNOSIS — E1122 Type 2 diabetes mellitus with diabetic chronic kidney disease: Secondary | ICD-10-CM | POA: Diagnosis not present

## 2016-05-04 DIAGNOSIS — Z79899 Other long term (current) drug therapy: Secondary | ICD-10-CM | POA: Diagnosis not present

## 2016-05-04 DIAGNOSIS — I872 Venous insufficiency (chronic) (peripheral): Secondary | ICD-10-CM | POA: Diagnosis not present

## 2016-05-04 DIAGNOSIS — I83028 Varicose veins of left lower extremity with ulcer other part of lower leg: Secondary | ICD-10-CM | POA: Diagnosis not present

## 2016-05-04 DIAGNOSIS — L97819 Non-pressure chronic ulcer of other part of right lower leg with unspecified severity: Secondary | ICD-10-CM | POA: Diagnosis not present

## 2016-05-04 DIAGNOSIS — L97829 Non-pressure chronic ulcer of other part of left lower leg with unspecified severity: Secondary | ICD-10-CM | POA: Diagnosis not present

## 2016-05-04 DIAGNOSIS — E785 Hyperlipidemia, unspecified: Secondary | ICD-10-CM | POA: Diagnosis not present

## 2016-05-04 DIAGNOSIS — L97811 Non-pressure chronic ulcer of other part of right lower leg limited to breakdown of skin: Secondary | ICD-10-CM | POA: Diagnosis not present

## 2016-05-04 DIAGNOSIS — Z7982 Long term (current) use of aspirin: Secondary | ICD-10-CM | POA: Diagnosis not present

## 2016-05-04 DIAGNOSIS — E7800 Pure hypercholesterolemia, unspecified: Secondary | ICD-10-CM | POA: Diagnosis not present

## 2016-05-04 DIAGNOSIS — L03116 Cellulitis of left lower limb: Secondary | ICD-10-CM | POA: Diagnosis not present

## 2016-05-04 DIAGNOSIS — I130 Hypertensive heart and chronic kidney disease with heart failure and stage 1 through stage 4 chronic kidney disease, or unspecified chronic kidney disease: Secondary | ICD-10-CM | POA: Diagnosis not present

## 2016-05-04 DIAGNOSIS — K219 Gastro-esophageal reflux disease without esophagitis: Secondary | ICD-10-CM | POA: Diagnosis not present

## 2016-05-04 DIAGNOSIS — Z7901 Long term (current) use of anticoagulants: Secondary | ICD-10-CM | POA: Diagnosis not present

## 2016-05-04 DIAGNOSIS — I509 Heart failure, unspecified: Secondary | ICD-10-CM | POA: Diagnosis not present

## 2016-05-04 DIAGNOSIS — I83018 Varicose veins of right lower extremity with ulcer other part of lower leg: Secondary | ICD-10-CM | POA: Diagnosis not present

## 2016-05-04 DIAGNOSIS — L97821 Non-pressure chronic ulcer of other part of left lower leg limited to breakdown of skin: Secondary | ICD-10-CM | POA: Diagnosis not present

## 2016-05-04 DIAGNOSIS — Z794 Long term (current) use of insulin: Secondary | ICD-10-CM | POA: Diagnosis not present

## 2016-05-04 DIAGNOSIS — N183 Chronic kidney disease, stage 3 (moderate): Secondary | ICD-10-CM | POA: Diagnosis not present

## 2016-05-04 DIAGNOSIS — I2510 Atherosclerotic heart disease of native coronary artery without angina pectoris: Secondary | ICD-10-CM | POA: Diagnosis not present

## 2016-05-05 ENCOUNTER — Ambulatory Visit (HOSPITAL_COMMUNITY): Payer: Medicare Other | Admitting: Physical Therapy

## 2016-05-05 ENCOUNTER — Encounter (HOSPITAL_COMMUNITY): Payer: Self-pay

## 2016-05-11 DIAGNOSIS — E1140 Type 2 diabetes mellitus with diabetic neuropathy, unspecified: Secondary | ICD-10-CM | POA: Diagnosis not present

## 2016-05-11 DIAGNOSIS — L03116 Cellulitis of left lower limb: Secondary | ICD-10-CM | POA: Diagnosis not present

## 2016-05-11 DIAGNOSIS — L97819 Non-pressure chronic ulcer of other part of right lower leg with unspecified severity: Secondary | ICD-10-CM | POA: Diagnosis not present

## 2016-05-11 DIAGNOSIS — E785 Hyperlipidemia, unspecified: Secondary | ICD-10-CM | POA: Diagnosis not present

## 2016-05-11 DIAGNOSIS — I509 Heart failure, unspecified: Secondary | ICD-10-CM | POA: Diagnosis not present

## 2016-05-11 DIAGNOSIS — Z79899 Other long term (current) drug therapy: Secondary | ICD-10-CM | POA: Diagnosis not present

## 2016-05-11 DIAGNOSIS — I872 Venous insufficiency (chronic) (peripheral): Secondary | ICD-10-CM | POA: Diagnosis not present

## 2016-05-11 DIAGNOSIS — I130 Hypertensive heart and chronic kidney disease with heart failure and stage 1 through stage 4 chronic kidney disease, or unspecified chronic kidney disease: Secondary | ICD-10-CM | POA: Diagnosis not present

## 2016-05-11 DIAGNOSIS — I83018 Varicose veins of right lower extremity with ulcer other part of lower leg: Secondary | ICD-10-CM | POA: Diagnosis not present

## 2016-05-11 DIAGNOSIS — Z7982 Long term (current) use of aspirin: Secondary | ICD-10-CM | POA: Diagnosis not present

## 2016-05-11 DIAGNOSIS — E1122 Type 2 diabetes mellitus with diabetic chronic kidney disease: Secondary | ICD-10-CM | POA: Diagnosis not present

## 2016-05-11 DIAGNOSIS — K219 Gastro-esophageal reflux disease without esophagitis: Secondary | ICD-10-CM | POA: Diagnosis not present

## 2016-05-11 DIAGNOSIS — I2510 Atherosclerotic heart disease of native coronary artery without angina pectoris: Secondary | ICD-10-CM | POA: Diagnosis not present

## 2016-05-11 DIAGNOSIS — E7800 Pure hypercholesterolemia, unspecified: Secondary | ICD-10-CM | POA: Diagnosis not present

## 2016-05-11 DIAGNOSIS — N183 Chronic kidney disease, stage 3 (moderate): Secondary | ICD-10-CM | POA: Diagnosis not present

## 2016-05-11 DIAGNOSIS — Z7901 Long term (current) use of anticoagulants: Secondary | ICD-10-CM | POA: Diagnosis not present

## 2016-05-11 DIAGNOSIS — L97829 Non-pressure chronic ulcer of other part of left lower leg with unspecified severity: Secondary | ICD-10-CM | POA: Diagnosis not present

## 2016-05-11 DIAGNOSIS — Z794 Long term (current) use of insulin: Secondary | ICD-10-CM | POA: Diagnosis not present

## 2016-05-11 DIAGNOSIS — I83028 Varicose veins of left lower extremity with ulcer other part of lower leg: Secondary | ICD-10-CM | POA: Diagnosis not present

## 2016-05-18 DIAGNOSIS — E785 Hyperlipidemia, unspecified: Secondary | ICD-10-CM | POA: Diagnosis not present

## 2016-05-18 DIAGNOSIS — Z7901 Long term (current) use of anticoagulants: Secondary | ICD-10-CM | POA: Diagnosis not present

## 2016-05-18 DIAGNOSIS — Z7982 Long term (current) use of aspirin: Secondary | ICD-10-CM | POA: Diagnosis not present

## 2016-05-18 DIAGNOSIS — N183 Chronic kidney disease, stage 3 (moderate): Secondary | ICD-10-CM | POA: Diagnosis not present

## 2016-05-18 DIAGNOSIS — I2510 Atherosclerotic heart disease of native coronary artery without angina pectoris: Secondary | ICD-10-CM | POA: Diagnosis not present

## 2016-05-18 DIAGNOSIS — K219 Gastro-esophageal reflux disease without esophagitis: Secondary | ICD-10-CM | POA: Diagnosis not present

## 2016-05-18 DIAGNOSIS — I872 Venous insufficiency (chronic) (peripheral): Secondary | ICD-10-CM | POA: Diagnosis not present

## 2016-05-18 DIAGNOSIS — I509 Heart failure, unspecified: Secondary | ICD-10-CM | POA: Diagnosis not present

## 2016-05-18 DIAGNOSIS — L03116 Cellulitis of left lower limb: Secondary | ICD-10-CM | POA: Diagnosis not present

## 2016-05-18 DIAGNOSIS — Z794 Long term (current) use of insulin: Secondary | ICD-10-CM | POA: Diagnosis not present

## 2016-05-18 DIAGNOSIS — E1122 Type 2 diabetes mellitus with diabetic chronic kidney disease: Secondary | ICD-10-CM | POA: Diagnosis not present

## 2016-05-18 DIAGNOSIS — L97829 Non-pressure chronic ulcer of other part of left lower leg with unspecified severity: Secondary | ICD-10-CM | POA: Diagnosis not present

## 2016-05-18 DIAGNOSIS — I83028 Varicose veins of left lower extremity with ulcer other part of lower leg: Secondary | ICD-10-CM | POA: Diagnosis not present

## 2016-05-18 DIAGNOSIS — R600 Localized edema: Secondary | ICD-10-CM | POA: Diagnosis not present

## 2016-05-18 DIAGNOSIS — E7800 Pure hypercholesterolemia, unspecified: Secondary | ICD-10-CM | POA: Diagnosis not present

## 2016-05-18 DIAGNOSIS — I130 Hypertensive heart and chronic kidney disease with heart failure and stage 1 through stage 4 chronic kidney disease, or unspecified chronic kidney disease: Secondary | ICD-10-CM | POA: Diagnosis not present

## 2016-05-18 DIAGNOSIS — Z79899 Other long term (current) drug therapy: Secondary | ICD-10-CM | POA: Diagnosis not present

## 2016-05-18 DIAGNOSIS — I83018 Varicose veins of right lower extremity with ulcer other part of lower leg: Secondary | ICD-10-CM | POA: Diagnosis not present

## 2016-05-18 DIAGNOSIS — E1140 Type 2 diabetes mellitus with diabetic neuropathy, unspecified: Secondary | ICD-10-CM | POA: Diagnosis not present

## 2016-05-18 DIAGNOSIS — L97819 Non-pressure chronic ulcer of other part of right lower leg with unspecified severity: Secondary | ICD-10-CM | POA: Diagnosis not present

## 2016-05-19 ENCOUNTER — Emergency Department (HOSPITAL_COMMUNITY)
Admission: EM | Admit: 2016-05-19 | Discharge: 2016-05-19 | Disposition: A | Discharge status: Home / Self Care | Payer: Medicare Other | Attending: Emergency Medicine | Admitting: Emergency Medicine

## 2016-05-19 ENCOUNTER — Encounter (HOSPITAL_COMMUNITY): Payer: Self-pay | Admitting: *Deleted

## 2016-05-19 ENCOUNTER — Emergency Department (HOSPITAL_COMMUNITY): Payer: Medicare Other

## 2016-05-19 DIAGNOSIS — E1122 Type 2 diabetes mellitus with diabetic chronic kidney disease: Secondary | ICD-10-CM | POA: Insufficient documentation

## 2016-05-19 DIAGNOSIS — R0602 Shortness of breath: Secondary | ICD-10-CM | POA: Diagnosis not present

## 2016-05-19 DIAGNOSIS — I5043 Acute on chronic combined systolic (congestive) and diastolic (congestive) heart failure: Secondary | ICD-10-CM | POA: Diagnosis not present

## 2016-05-19 DIAGNOSIS — Z7984 Long term (current) use of oral hypoglycemic drugs: Secondary | ICD-10-CM | POA: Diagnosis not present

## 2016-05-19 DIAGNOSIS — I252 Old myocardial infarction: Secondary | ICD-10-CM | POA: Diagnosis not present

## 2016-05-19 DIAGNOSIS — N183 Chronic kidney disease, stage 3 (moderate): Secondary | ICD-10-CM | POA: Insufficient documentation

## 2016-05-19 DIAGNOSIS — I130 Hypertensive heart and chronic kidney disease with heart failure and stage 1 through stage 4 chronic kidney disease, or unspecified chronic kidney disease: Secondary | ICD-10-CM | POA: Insufficient documentation

## 2016-05-19 DIAGNOSIS — Z794 Long term (current) use of insulin: Secondary | ICD-10-CM | POA: Diagnosis not present

## 2016-05-19 DIAGNOSIS — I2510 Atherosclerotic heart disease of native coronary artery without angina pectoris: Secondary | ICD-10-CM | POA: Diagnosis not present

## 2016-05-19 DIAGNOSIS — I509 Heart failure, unspecified: Secondary | ICD-10-CM

## 2016-05-19 DIAGNOSIS — I110 Hypertensive heart disease with heart failure: Secondary | ICD-10-CM | POA: Diagnosis not present

## 2016-05-19 LAB — URINALYSIS, ROUTINE W REFLEX MICROSCOPIC
BILIRUBIN URINE: NEGATIVE
Glucose, UA: 100 mg/dL — AB
KETONES UR: NEGATIVE mg/dL
LEUKOCYTES UA: NEGATIVE
NITRITE: NEGATIVE
PH: 7.5 (ref 5.0–8.0)
Specific Gravity, Urine: 1.02 (ref 1.005–1.030)

## 2016-05-19 LAB — CBC WITH DIFFERENTIAL/PLATELET
BASOS ABS: 0.1 10*3/uL (ref 0.0–0.1)
BASOS PCT: 1 %
EOS ABS: 0.2 10*3/uL (ref 0.0–0.7)
Eosinophils Relative: 1 %
HEMATOCRIT: 39.4 % (ref 39.0–52.0)
Hemoglobin: 12.5 g/dL — ABNORMAL LOW (ref 13.0–17.0)
Lymphocytes Relative: 5 %
Lymphs Abs: 0.9 10*3/uL (ref 0.7–4.0)
MCH: 29.8 pg (ref 26.0–34.0)
MCHC: 31.7 g/dL (ref 30.0–36.0)
MCV: 94 fL (ref 78.0–100.0)
MONO ABS: 1.1 10*3/uL — AB (ref 0.1–1.0)
Monocytes Relative: 7 %
NEUTROS ABS: 14.2 10*3/uL — AB (ref 1.7–7.7)
NEUTROS PCT: 86 %
PLATELETS: 137 10*3/uL — AB (ref 150–400)
RBC: 4.19 MIL/uL — ABNORMAL LOW (ref 4.22–5.81)
RDW: 15.3 % (ref 11.5–15.5)
WBC: 16.4 10*3/uL — ABNORMAL HIGH (ref 4.0–10.5)

## 2016-05-19 LAB — COMPREHENSIVE METABOLIC PANEL
ALT: 17 U/L (ref 17–63)
AST: 21 U/L (ref 15–41)
Albumin: 4.2 g/dL (ref 3.5–5.0)
Alkaline Phosphatase: 122 U/L (ref 38–126)
Anion gap: 8 (ref 5–15)
BILIRUBIN TOTAL: 1.8 mg/dL — AB (ref 0.3–1.2)
BUN: 38 mg/dL — ABNORMAL HIGH (ref 6–20)
CHLORIDE: 110 mmol/L (ref 101–111)
CO2: 21 mmol/L — ABNORMAL LOW (ref 22–32)
CREATININE: 1.72 mg/dL — AB (ref 0.61–1.24)
Calcium: 10.1 mg/dL (ref 8.9–10.3)
GFR, EST AFRICAN AMERICAN: 41 mL/min — AB (ref >60)
GFR, EST NON AFRICAN AMERICAN: 36 mL/min — AB (ref >60)
Glucose, Bld: 174 mg/dL — ABNORMAL HIGH (ref 65–99)
Potassium: 5 mmol/L (ref 3.5–5.1)
Sodium: 139 mmol/L (ref 135–145)
TOTAL PROTEIN: 7.7 g/dL (ref 6.5–8.1)

## 2016-05-19 LAB — BRAIN NATRIURETIC PEPTIDE: B NATRIURETIC PEPTIDE 5: 2598 pg/mL — AB (ref 0.0–100.0)

## 2016-05-19 LAB — URINE MICROSCOPIC-ADD ON
Bacteria, UA: NONE SEEN
SQUAMOUS EPITHELIAL / LPF: NONE SEEN
WBC UA: NONE SEEN WBC/hpf (ref 0–5)

## 2016-05-19 LAB — TROPONIN I: TROPONIN I: 0.03 ng/mL — AB (ref <0.03)

## 2016-05-19 MED ORDER — FUROSEMIDE 10 MG/ML IJ SOLN
80.0000 mg | Freq: Once | INTRAMUSCULAR | Status: AC
  Administered 2016-05-19: 80 mg via INTRAVENOUS
  Filled 2016-05-19: qty 8

## 2016-05-19 MED ORDER — NITROGLYCERIN 2 % TD OINT
1.0000 [in_us] | TOPICAL_OINTMENT | Freq: Once | TRANSDERMAL | Status: AC
  Administered 2016-05-19: 1 [in_us] via TOPICAL
  Filled 2016-05-19: qty 1

## 2016-05-19 NOTE — ED Notes (Signed)
Ambulated patient around in the room. Patient able to walk with minimal assistance, O2 sats stayed in the low 90's with increased respirations. Patient was able to get back in the bed with no assistance.

## 2016-05-19 NOTE — ED Notes (Signed)
Pt refused to have legs redressed prior to leaving the er,

## 2016-05-19 NOTE — ED Notes (Signed)
Pt states that he is being treated for "sores" to lower legs, was seen at wound center yesterday, had legs wrapped, continues to have pain in bilateral lower legs, after removing dressings, legs were noted to be red, warm to touch as well,

## 2016-05-19 NOTE — ED Notes (Signed)
Pt states that he is breathing better,

## 2016-05-19 NOTE — ED Triage Notes (Signed)
Pt c/o sob that started yesterday along with chills, n/v bilateral lower leg pain that has been ongoing,

## 2016-05-19 NOTE — Discharge Instructions (Signed)
Look at the low sodium diet and try to avoid salt in your diet which can make you gain fluid weight. For the next 2-3 days take an extra dose of your fluid pill. Continue to monitor weight daily. You should start to see the ear losing weight. If you are not losing weight and you continue to gain or you get more short of breath you should return to the ED and be prepared to be admitted.

## 2016-05-19 NOTE — ED Provider Notes (Signed)
[facility] DEPT Provider Note   CSN: [id number] Arrival date & time: 05/19/16  0321  Time seen 04:10 AM   History   Chief Complaint Chief Complaint  Patient presents with  . Shortness of Breath    HPI Zachary Mcmahon is a 82 y.o. male.  HPI patient has a history of congestive heart failure. He reports about 2 days ago his legs started swelling again and he has some seeping of his legs. He is already going to a wound Center for prior ulcers on his legs which have healed. His caregiver states for the past week he's gaining about 2 pounds every day. He complains of getting more short of breath, PND, without abdominal tightness or swelling. He denies chest pain, cough, or fever. He states he is taking his medication and it is not clear whether he is on furosemide or torsemide. He denies fever but has had some chills. He had a Doppler ultrasound done at [facility] on September 27 that was negative for DVT.   PCP Dr [place] [facility] Dr Jacinta Shoe  Past Medical History:  Diagnosis Date  . Arthritis    "legs" (02/07/2015)  . Basal cell carcinoma of forehead 2016 X 2  . Basal cell carcinoma of left earlobe   . CHF (congestive heart failure) ([place])   . Diabetic peripheral neuropathy ([place])    "left foot" (02/07/2015)  . GERD (gastroesophageal reflux disease)   . High potassium   . History of gout X 1  . Hyperlipidemia   . Hypertension   . Old myocardial infarct    "sometime in the past; don't know when" (02/07/2015)  . Pain and swelling of left lower leg    chronic  . Pneumonia X 1  . Renal insufficiency   . Respiratory failure ([place])   . Type II diabetes mellitus ([place])   . Varicose veins     Patient Active Problem List   Diagnosis Date Noted  . Acute on chronic combined systolic and diastolic CHF (congestive heart failure) ([place]) 10/03/2015  . Chronic anticoagulation 10/03/2015  . CKD (chronic kidney disease), stage III 10/03/2015  . Chronic kidney disease (CKD), stage  IV (severe) ([place]) 03/05/2015  . Acute pulmonary embolism ([place]) 03/04/2015  . Pulmonary embolism ([place]) 03/03/2015  . 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 Pressure ulcer 02/14/2015  . CHF (congestive heart failure) ([place])   . Renal insufficiency   . Acute respiratory failure with hypoxia ([place]) 02/12/2015  . Arterial hypotension   . Pulmonary edema   . Acute on chronic combined systolic and diastolic HF (heart failure) ([place])   . Abdominal distension   . Acute renal failure superimposed on stage 3 chronic kidney disease ([place])   . NSTEMI (non-ST elevated myocardial infarction) ([place])   . Non-STEMI (non-ST elevated myocardial infarction) ([place]) 02/07/2015  . Unstable angina ([place]) 02/06/2015  . CKD (chronic kidney disease) stage 3, GFR 30-59 ml/min 02/06/2015  . Chest pain 02/06/2015  . Abdominal pain, acute, generalized 02/06/2015  . Diarrhea 02/06/2015  . Swelling of extremity, left, chronic 02/06/2015  . Ileus ([place]) 02/06/2015  . Hyperlipidemia 04/23/2014  . Coronary artery disease 04/23/2014  . Varicose veins of lower extremities with other complications 59/93/5701  . Unspecified hemorrhoids with other complication 77/93/9030  . Intertrochanteric fracture of right hip ([place]) 03/20/2012  . HTN (hypertension), malignant 03/20/2012  . DM type 2 (diabetes mellitus, type 2) ([place]) 03/20/2012  . Acute renal failure ([place]) 03/20/2012  . Thrombocytopenia ([place]) 03/20/2012  . Hyperkalemia  03/20/2012    Past Surgical History:  Procedure Laterality Date  . BASAL CELL CARCINOMA EXCISION     "probably 1/2 dozen cut off face, left ear" (02/07/2015)  . CARDIAC CATHETERIZATION N/A 02/09/2015   Procedure: Left Heart Cath and Coronary Angiography;  Surgeon: Leonie Man, MD;  Location: [facility];  Service: Cardiovascular;  Laterality: N/A;  . CARDIOVASCULAR STRESS TEST  05/29/2012   Mild-moderate perfusion defect due to infarct/scar with mild-moderate perinfarct ischemia seen in the mid anterior, apical  anterior, apical septal, and apical regions. No ECG changes. Global LV systolic function is severely reduced.  Marland Kitchen CATARACT EXTRACTION W/PHACO Left 11/12/2015   Procedure: CATARACT EXTRACTION PHACO AND INTRAOCULAR LENS PLACEMENT LEFT EYE;  Surgeon: Tonny Branch, MD;  Location: [facility];  Service: Ophthalmology;  Laterality: Left;  CDE: 9.82  . CATARACT EXTRACTION W/PHACO Right 12/14/2015   Procedure: CATARACT EXTRACTION PHACO AND INTRAOCULAR LENS PLACEMENT (IOC);  Surgeon: Tonny Branch, MD;  Location: [facility];  Service: Ophthalmology;  Laterality: Right;  CDE: 13.86  . CORONARY ARTERY BYPASS GRAFT N/A 02/17/2015   Procedure: CORONARY ARTERY BYPASS GRAFTING (CABG), ON PUMP, TIMES FIVE, USING LEFT INTERNAL MAMMARY ARTERY, RIGHT GREATER SAPHENOUS VEIN HARVESTED ENDOSCOPICALLY;  Surgeon: [name], MD;  Location: [address];  Service: Open Heart Surgery;  Laterality: N/A;  -LIMA to LAD -SVG to DIAGONAL - SEQ SVG to OM1 and PLB -SVG to PDA  . CYSTOSCOPY W/ STONE MANIPULATION  X 1  . FRACTURE SURGERY    . KNEE ARTHROSCOPY Right ~ 2008  . LEXISCAM MYOCARDIAL PERFUSION  05/29/12   MARKED PERFUSION DEFECT DUE TO INFARC/SCAR WITH MILD PERINFARCT ISCHEMIA IN THE BASAL INFERIOR, MID INFEROSEPTAL, MID INFERIOR AND APICALINFERIOR REGION. EF%33%. PERIINFARCT ISCHEMIA IN THE MID ANTERIOR, APICAL ANTERIOR, APICAL SEPTAL AND APICAL REGIONS.  . LOWER EXTREMITY VENOUS DOPPLER  07/11/2012   No evidence of DVT in the left lower extremity. Evidence of partially recanalized, chronic, non-obstructive thrombus in the left great SV and its branches consistent with significant reflux consistent with post-phlebitic syndrome. Significant reflux of the left short saphenous vein.  Marland Kitchen open heart sx  02/17/15  . ORIF HIP FRACTURE  03/20/2012   Procedure: OPEN REDUCTION INTERNAL FIXATION HIP;  Surgeon: Sanjuana Kava, MD;  Location: [facility];  Service: Orthopedics;  Laterality: Right;  . TEE WITHOUT CARDIOVERSION N/A 02/17/2015   Procedure:  TRANSESOPHAGEAL ECHOCARDIOGRAM (TEE);  Surgeon: [name], MD;  Location: [facility];  Service: Open Heart Surgery;  Laterality: N/A;  . TRANSTHORACIC ECHOCARDIOGRAM  04/18/2012   EF 45%, mild-moderate LVH  . TRANSTHORACIC ECHOCARDIOGRAM  04/18/12   EF% 45%.SEVERE HYPOKINESIS TO AKINESIS OF THE MID-DISTAL INFEROLATERAL MYOCARDIUM AND MUCH OF THE APEX.       Home Medications    Prior to Admission medications   Medication Sig Start Date End Date Taking? Authorizing Provider  acetaminophen (TYLENOL) 500 MG tablet Take 500 mg by mouth every 6 (six) hours as needed for mild pain.    Historical Provider, MD  apixaban (ELIQUIS) 2.5 MG TABS tablet Take 1 tablet (2.5 mg total) by mouth 2 (two) times daily. 05/03/16   Herminio Commons, MD  aspirin EC 81 MG tablet Take 81 mg by mouth every morning.    Historical Provider, MD  atorvastatin (LIPITOR) 20 MG tablet TAKE 1 TABLET BY MOUTH DAILY AT 6:00 P.M. ( MUST SCHEDULE AN APPOINTMENT FOR FUTURE REFILLS ) Patient taking differently: TAKE 1 TABLET BY MOUTH DAILY AT 6:00 P.M. 03/27/14  Lorretta Harp, MD  carvedilol (COREG) 6.25 MG tablet Take 1 tablet (6.25 mg total) by mouth 2 (two) times daily. 12/02/15   Herminio Commons, MD  diclofenac sodium (VOLTAREN) 1 % GEL Apply 2 g topically 2 (two) times daily.    Historical Provider, MD  ferrous sulfate 325 (65 FE) MG tablet Take 1 tablet (325 mg total) by mouth daily with breakfast. 02/24/15   Donielle Liston Alba, PA-C  furosemide (LASIX) 40 MG tablet Take one tab (40mg ) by mouth every morning & 1/2 tab (20mg ) every evening 05/03/16   Herminio Commons, MD  gabapentin (NEURONTIN) 100 MG capsule Take 100 mg by mouth 3 (three) times daily.  04/01/14   Historical Provider, MD  hydrALAZINE (APRESOLINE) 50 MG tablet Take 0.5 tablets (25 mg total) by mouth 2 (two) times daily. 03/29/16   Herminio Commons, MD  LEVEMIR FLEXTOUCH 100 UNIT/ML Pen Inject 27 Units into the skin at bedtime. Patient taking  differently: Inject 34 Units into the skin at bedtime.  03/05/15   Radene Gunning, NP  losartan (COZAAR) 100 MG tablet Take 100 mg by mouth daily.    Historical Provider, MD  omeprazole (PRILOSEC) 20 MG capsule Take 20 mg by mouth daily.    Historical Provider, MD  traMADol (ULTRAM) 50 MG tablet Take 50 mg by mouth every 6 (six) hours as needed for moderate pain.    Historical Provider, MD    Family History Family History  Problem Relation Age of Onset  . Deep vein thrombosis Mother   . Hypertension Mother   . Hypertension Sister   . Diabetes Brother   . Hyperlipidemia Brother   . Hypertension Brother   . Heart attack Brother     Social History Social History  Substance Use Topics  . Smoking status: Never Smoker  . Smokeless tobacco: Never Used  . Alcohol use No     Allergies   Bactrim [sulfamethoxazole-trimethoprim]   Review of Systems Review of Systems  All other systems reviewed and are negative.    Physical Exam Updated Vital Signs BP 183/89   Pulse 84   Temp 98.7 F (37.1 C) (Oral)   Resp 26   Ht 5\' 9"  (1.753 m)   Wt 222 lb (100.7 kg)   SpO2 94%   BMI 32.78 kg/m   Vital signs normal except for hypertension   Physical Exam  Constitutional: He is oriented to person, place, and time. He appears well-developed and well-nourished.  Non-toxic appearance. He does not appear ill. No distress.  HENT:  Head: Normocephalic and atraumatic.  Right Ear: External ear normal.  Left Ear: External ear normal.  Nose: Nose normal. No mucosal edema or rhinorrhea.  Mouth/Throat: Oropharynx is clear and moist and mucous membranes are normal. No dental abscesses or uvula swelling.  Eyes: Conjunctivae and EOM are normal. Pupils are equal, round, and reactive to light.  Neck: Normal range of motion and full passive range of motion without pain. Neck supple.  Cardiovascular: Normal rate, regular rhythm and normal heart sounds.  Exam reveals no gallop and no friction rub.   No  murmur heard. Pulmonary/Chest: Tachypnea noted. He is in respiratory distress. He has decreased breath sounds. He has no wheezes. He has no rhonchi. He has no rales. He exhibits no tenderness and no crepitus.  Abdominal: Soft. Normal appearance and bowel sounds are normal. He exhibits no distension. There is no tenderness. There is no rebound and no guarding.  Musculoskeletal: Normal range of  motion. He exhibits edema. He exhibits no tenderness.  Moves all extremities well. Patient has some mild diffuse redness between his knees and his ankles with some mild swelling, the left seems worse than the right. There are no obvious open ulcers at this time.  Neurological: He is alert and oriented to person, place, and time. He has normal strength. No cranial nerve deficit.  Skin: Skin is warm, dry and intact. No rash noted. No erythema. No pallor.  Psychiatric: He has a normal mood and affect. His speech is normal and behavior is normal. His mood appears not anxious.  Nursing note and vitals reviewed.      ED Treatments / Results  Labs (all labs ordered are listed, but only abnormal results are displayed) Results for orders placed or performed during the hospital encounter of 05/19/16  Comprehensive metabolic panel  Result Value Ref Range   Sodium 139 135 - 145 mmol/L   Potassium 5.0 3.5 - 5.1 mmol/L   Chloride 110 101 - 111 mmol/L   CO2 21 (L) 22 - 32 mmol/L   Glucose, Bld 174 (H) 65 - 99 mg/dL   BUN 38 (H) 6 - 20 mg/dL   Creatinine, Ser 1.72 (H) 0.61 - 1.24 mg/dL   Calcium 10.1 8.9 - 10.3 mg/dL   Total Protein 7.7 6.5 - 8.1 g/dL   Albumin 4.2 3.5 - 5.0 g/dL   AST 21 15 - 41 U/L   ALT 17 17 - 63 U/L   Alkaline Phosphatase 122 38 - 126 U/L   Total Bilirubin 1.8 (H) 0.3 - 1.2 mg/dL   GFR calc non Af Amer 36 (L) >60 mL/min   GFR calc Af Amer 41 (L) >60 mL/min   Anion gap 8 5 - 15  Troponin I  Result Value Ref Range   Troponin I 0.03 (HH) <0.03 ng/mL  Brain natriuretic peptide  Result  Value Ref Range   B Natriuretic Peptide 2,598.0 (H) 0.0 - 100.0 pg/mL  CBC with Differential  Result Value Ref Range   WBC 16.4 (H) 4.0 - 10.5 K/uL   RBC 4.19 (L) 4.22 - 5.81 MIL/uL   Hemoglobin 12.5 (L) 13.0 - 17.0 g/dL   HCT 39.4 39.0 - 52.0 %   MCV 94.0 78.0 - 100.0 fL   MCH 29.8 26.0 - 34.0 pg   MCHC 31.7 30.0 - 36.0 g/dL   RDW 15.3 11.5 - 15.5 %   Platelets 137 (L) 150 - 400 K/uL   Neutrophils Relative % 86 %   Neutro Abs 14.2 (H) 1.7 - 7.7 K/uL   Lymphocytes Relative 5 %   Lymphs Abs 0.9 0.7 - 4.0 K/uL   Monocytes Relative 7 %   Monocytes Absolute 1.1 (H) 0.1 - 1.0 K/uL   Eosinophils Relative 1 %   Eosinophils Absolute 0.2 0.0 - 0.7 K/uL   Basophils Relative 1 %   Basophils Absolute 0.1 0.0 - 0.1 K/uL  Urinalysis, Routine w reflex microscopic  Result Value Ref Range   Color, Urine YELLOW YELLOW   APPearance CLEAR CLEAR   Specific Gravity, Urine 1.020 1.005 - 1.030   pH 7.5 5.0 - 8.0   Glucose, UA 100 (A) NEGATIVE mg/dL   Hgb urine dipstick SMALL (A) NEGATIVE   Bilirubin Urine NEGATIVE NEGATIVE   Ketones, ur NEGATIVE NEGATIVE mg/dL   Protein, ur >300 (A) NEGATIVE mg/dL   Nitrite NEGATIVE NEGATIVE   Leukocytes, UA NEGATIVE NEGATIVE  Urine microscopic-add on  Result Value Ref Range   Squamous  Epithelial / LPF NONE SEEN NONE SEEN   WBC, UA NONE SEEN 0 - 5 WBC/hpf   RBC / HPF 0-5 0 - 5 RBC/hpf   Bacteria, UA NONE SEEN NONE SEEN     Laboratory interpretation all normal except Chronically mildly elevated troponin, stable renal insufficiency, elevation of his chronically elevated BNP above baseline, stable mild anemia     EKG  EKG Interpretation  Date/Time:  Thursday May 19 2016 03:32:14 EDT Ventricular Rate:  89 PR Interval:    QRS Duration: 102 QT Interval:  365 QTC Calculation: 445 R Axis:   78 Text Interpretation:  Sinus rhythm Ventricular trigeminy Low voltage, extremity and precordial leads Baseline wander No significant change since last tracing  22 Apr 2016 Confirmed by Krystalyn Kubota  MD-I, Aryam Zhan ([id number]) on 05/19/2016 3:42:53 AM       Radiology Dg Chest Port 1 View  Result Date: 05/19/2016 CLINICAL DATA:  Shortness of breath beginning yesterday along with chills. Lower extremity pain. History of CHF. EXAM: PORTABLE CHEST 1 VIEW COMPARISON:  Chest radiograph Jan 08, 2016 FINDINGS: Cardiac silhouette is moderately enlarged unchanged. Mediastinal silhouette is nonsuspicious, mildly calcified aortic knob. Status post median sternotomy CABG. Pulmonary vascular congestion and similar interstitial prominence with chronic blunting of the RIGHT costophrenic angle. No focal consolidation. No pneumothorax. Large body habitus. Osseous structures are nonsuspicious. IMPRESSION: Stable examination: Cardiomegaly and pulmonary edema/ CHF with small RIGHT pleural effusion versus pleural thickening. Electronically Signed   By: Elon Alas M.D.   On: 05/19/2016 05:02    Procedures Procedures (including critical care time)  Medications Ordered in ED Medications  furosemide (LASIX) injection 80 mg (80 mg Intravenous Given 05/19/16 0447)  nitroGLYCERIN (NITROGLYN) 2 % ointment 1 inch (1 inch Topical Given 05/19/16 0447)     Initial Impression / Assessment and Plan / ED Course  I have reviewed the triage vital signs and the nursing notes.  Pertinent labs & imaging results that were available during my care of the patient were reviewed by me and considered in my medical decision making (see chart for details).  Clinical Course   Patient was given IV Lasix and nitroglycerin paste for his hypertension. Blood work and chest x-ray was ordered to further evaluate him for suspected exacerbation of congestive heart failure.  Recheck at 5:35 AM blood pressures 183/89. Urinary outputs about 700 mL. Patient states is feeling better. He feels like he could be discharged home. I'm going to have nurses get him up and ambulating this see how he does.  Nursing staff  states patient was ambulatory in the room and his pulse ox remained in the low 90s. It seemed to make him more short of breath, however he rapidly recovered.  Recheck at 6:30 AM patient states he feels better and he wants to go home. We discussed taking an extra dose of his fluid pill for the next 2-3 days and hopefully he will stop gaining weight and start losing the fluid weight. He was given information about a low-sodium diet. He states he has plenty of fluid pills to take at home. He is advised to return if his breathing gets worse and at that point consider being admitted.   Final Clinical Impressions(s) / ED Diagnoses   Final diagnoses:  Acute on chronic congestive heart failure, unspecified congestive heart failure type [facility])    Plan discharge  Rolland Porter, MD, Barbette Or, MD 05/19/16 [phone]

## 2016-05-23 DIAGNOSIS — N183 Chronic kidney disease, stage 3 (moderate): Secondary | ICD-10-CM | POA: Diagnosis not present

## 2016-05-23 DIAGNOSIS — I509 Heart failure, unspecified: Secondary | ICD-10-CM | POA: Diagnosis not present

## 2016-05-23 DIAGNOSIS — R0602 Shortness of breath: Secondary | ICD-10-CM | POA: Diagnosis not present

## 2016-05-23 DIAGNOSIS — E1122 Type 2 diabetes mellitus with diabetic chronic kidney disease: Secondary | ICD-10-CM | POA: Diagnosis not present

## 2016-05-23 DIAGNOSIS — I10 Essential (primary) hypertension: Secondary | ICD-10-CM | POA: Diagnosis not present

## 2016-05-23 DIAGNOSIS — R600 Localized edema: Secondary | ICD-10-CM | POA: Diagnosis not present

## 2016-05-26 DIAGNOSIS — E119 Type 2 diabetes mellitus without complications: Secondary | ICD-10-CM | POA: Diagnosis not present

## 2016-05-26 DIAGNOSIS — I872 Venous insufficiency (chronic) (peripheral): Secondary | ICD-10-CM | POA: Diagnosis not present

## 2016-05-26 DIAGNOSIS — N189 Chronic kidney disease, unspecified: Secondary | ICD-10-CM | POA: Diagnosis not present

## 2016-05-26 DIAGNOSIS — L97821 Non-pressure chronic ulcer of other part of left lower leg limited to breakdown of skin: Secondary | ICD-10-CM | POA: Diagnosis not present

## 2016-05-26 DIAGNOSIS — L97811 Non-pressure chronic ulcer of other part of right lower leg limited to breakdown of skin: Secondary | ICD-10-CM | POA: Diagnosis not present

## 2016-05-26 DIAGNOSIS — L97828 Non-pressure chronic ulcer of other part of left lower leg with other specified severity: Secondary | ICD-10-CM | POA: Diagnosis not present

## 2016-05-26 DIAGNOSIS — I279 Pulmonary heart disease, unspecified: Secondary | ICD-10-CM | POA: Diagnosis not present

## 2016-05-26 DIAGNOSIS — I83018 Varicose veins of right lower extremity with ulcer other part of lower leg: Secondary | ICD-10-CM | POA: Diagnosis not present

## 2016-05-26 DIAGNOSIS — L97819 Non-pressure chronic ulcer of other part of right lower leg with unspecified severity: Secondary | ICD-10-CM | POA: Diagnosis not present

## 2016-06-14 DIAGNOSIS — I279 Pulmonary heart disease, unspecified: Secondary | ICD-10-CM | POA: Diagnosis not present

## 2016-06-14 DIAGNOSIS — E119 Type 2 diabetes mellitus without complications: Secondary | ICD-10-CM | POA: Diagnosis not present

## 2016-06-14 DIAGNOSIS — I872 Venous insufficiency (chronic) (peripheral): Secondary | ICD-10-CM | POA: Diagnosis not present

## 2016-06-14 DIAGNOSIS — L97828 Non-pressure chronic ulcer of other part of left lower leg with other specified severity: Secondary | ICD-10-CM | POA: Diagnosis not present

## 2016-06-14 DIAGNOSIS — L97819 Non-pressure chronic ulcer of other part of right lower leg with unspecified severity: Secondary | ICD-10-CM | POA: Diagnosis not present

## 2016-06-14 DIAGNOSIS — N189 Chronic kidney disease, unspecified: Secondary | ICD-10-CM | POA: Diagnosis not present

## 2016-06-14 DIAGNOSIS — L97822 Non-pressure chronic ulcer of other part of left lower leg with fat layer exposed: Secondary | ICD-10-CM | POA: Diagnosis not present

## 2016-06-23 ENCOUNTER — Telehealth: Payer: Self-pay | Admitting: Cardiovascular Disease

## 2016-06-23 NOTE — Telephone Encounter (Signed)
Have him increase coreg to 12.5mg  bid, hold hydralazine for now. Update [place] next week on bp's and rash   J Nishaan Stanke MD

## 2016-06-23 NOTE — Telephone Encounter (Signed)
Home health nurse concerned about home BP checks trending high - 150-180s/70-90s HR 60-90. Denies CP/SOB but says he is having dizziness - has been taking hydralazine 25 mg twice daily. Requesting that pt be seen for OV or increase BP meds - also says pt thinks that hydralazine causing small rashes. Will forward to Dr. Harl Bowie Dr. Bronson Ing out of office.

## 2016-06-23 NOTE — Telephone Encounter (Signed)
Spoke with home health nurse and will update [place] next week - also LM for pt - home health nurse says she will also make the pt aware of changes.

## 2016-06-28 ENCOUNTER — Encounter: Payer: Medicare Other | Admitting: *Deleted

## 2016-07-04 ENCOUNTER — Other Ambulatory Visit: Payer: Self-pay | Admitting: *Deleted

## 2016-07-04 MED ORDER — OMRON 7 SERIES BP MONITOR DEVI: 1.0000 [IU] | 0 refills | Status: DC | PRN

## 2016-07-18 ENCOUNTER — Other Ambulatory Visit: Payer: Self-pay | Admitting: Cardiovascular Disease

## 2016-07-18 DIAGNOSIS — N08 Glomerular disorders in diseases classified elsewhere: Secondary | ICD-10-CM | POA: Diagnosis not present

## 2016-07-18 DIAGNOSIS — R319 Hematuria, unspecified: Secondary | ICD-10-CM | POA: Diagnosis not present

## 2016-07-18 DIAGNOSIS — R109 Unspecified abdominal pain: Secondary | ICD-10-CM | POA: Diagnosis not present

## 2016-07-18 DIAGNOSIS — R197 Diarrhea, unspecified: Secondary | ICD-10-CM | POA: Diagnosis not present

## 2016-07-18 DIAGNOSIS — N390 Urinary tract infection, site not specified: Secondary | ICD-10-CM | POA: Diagnosis not present

## 2016-07-18 DIAGNOSIS — E875 Hyperkalemia: Secondary | ICD-10-CM | POA: Diagnosis not present

## 2016-07-18 DIAGNOSIS — R3121 Asymptomatic microscopic hematuria: Secondary | ICD-10-CM | POA: Diagnosis not present

## 2016-07-18 MED ORDER — CARVEDILOL 6.25 MG PO TABS: 6.2500 mg | ORAL_TABLET | Freq: Two times a day (BID) | ORAL | 0 refills | Status: DC

## 2016-07-18 NOTE — Telephone Encounter (Signed)
Refill:    carvedilol (COREG) 6.25 MG       [facility].  Patient is completely out.

## 2016-07-18 NOTE — Telephone Encounter (Signed)
Left message to return call 

## 2016-07-18 NOTE — Telephone Encounter (Signed)
Pharmacist with [facility] called to get rx for dose increase of carvedilol. Refill given over the phone.

## 2016-07-25 ENCOUNTER — Other Ambulatory Visit (HOSPITAL_COMMUNITY): Payer: Self-pay | Admitting: Nephrology

## 2016-07-25 DIAGNOSIS — N183 Chronic kidney disease, stage 3 unspecified: Secondary | ICD-10-CM

## 2016-07-25 DIAGNOSIS — I10 Essential (primary) hypertension: Secondary | ICD-10-CM | POA: Diagnosis not present

## 2016-07-25 DIAGNOSIS — E1122 Type 2 diabetes mellitus with diabetic chronic kidney disease: Secondary | ICD-10-CM | POA: Diagnosis not present

## 2016-07-27 DIAGNOSIS — I10 Essential (primary) hypertension: Secondary | ICD-10-CM | POA: Diagnosis not present

## 2016-07-27 DIAGNOSIS — N184 Chronic kidney disease, stage 4 (severe): Secondary | ICD-10-CM | POA: Diagnosis not present

## 2016-07-27 DIAGNOSIS — E782 Mixed hyperlipidemia: Secondary | ICD-10-CM | POA: Diagnosis not present

## 2016-07-27 DIAGNOSIS — I519 Heart disease, unspecified: Secondary | ICD-10-CM | POA: Diagnosis not present

## 2016-07-27 DIAGNOSIS — I2510 Atherosclerotic heart disease of native coronary artery without angina pectoris: Secondary | ICD-10-CM | POA: Diagnosis not present

## 2016-07-29 ENCOUNTER — Ambulatory Visit (HOSPITAL_COMMUNITY)
Admission: RE | Admit: 2016-07-29 | Discharge: 2016-07-29 | Disposition: A | Discharge status: Home / Self Care | Payer: Medicare Other | Source: Ambulatory Visit | Attending: Nephrology | Admitting: Nephrology

## 2016-07-29 DIAGNOSIS — Z79899 Other long term (current) drug therapy: Secondary | ICD-10-CM | POA: Diagnosis not present

## 2016-07-29 DIAGNOSIS — N183 Chronic kidney disease, stage 3 unspecified: Secondary | ICD-10-CM

## 2016-07-29 DIAGNOSIS — E559 Vitamin D deficiency, unspecified: Secondary | ICD-10-CM | POA: Diagnosis not present

## 2016-07-29 DIAGNOSIS — R809 Proteinuria, unspecified: Secondary | ICD-10-CM | POA: Diagnosis not present

## 2016-07-29 DIAGNOSIS — N189 Chronic kidney disease, unspecified: Secondary | ICD-10-CM | POA: Diagnosis not present

## 2016-07-29 DIAGNOSIS — N400 Enlarged prostate without lower urinary tract symptoms: Secondary | ICD-10-CM | POA: Insufficient documentation

## 2016-07-29 DIAGNOSIS — D509 Iron deficiency anemia, unspecified: Secondary | ICD-10-CM | POA: Diagnosis not present

## 2016-08-05 DIAGNOSIS — R809 Proteinuria, unspecified: Secondary | ICD-10-CM | POA: Diagnosis not present

## 2016-08-05 DIAGNOSIS — E559 Vitamin D deficiency, unspecified: Secondary | ICD-10-CM | POA: Diagnosis not present

## 2016-08-05 DIAGNOSIS — N183 Chronic kidney disease, stage 3 (moderate): Secondary | ICD-10-CM | POA: Diagnosis not present

## 2016-08-05 DIAGNOSIS — N2581 Secondary hyperparathyroidism of renal origin: Secondary | ICD-10-CM | POA: Diagnosis not present

## 2016-08-05 DIAGNOSIS — D509 Iron deficiency anemia, unspecified: Secondary | ICD-10-CM | POA: Diagnosis not present

## 2016-08-09 ENCOUNTER — Inpatient Hospital Stay (HOSPITAL_COMMUNITY)
Admission: EM | Admit: 2016-08-09 | Discharge: 2016-08-16 | DRG: 372 | Disposition: A | Discharge status: Home / Self Care | Payer: Medicare Other | Attending: Family Medicine | Admitting: Family Medicine

## 2016-08-09 ENCOUNTER — Emergency Department (HOSPITAL_COMMUNITY): Payer: Medicare Other

## 2016-08-09 ENCOUNTER — Encounter (HOSPITAL_COMMUNITY): Payer: Self-pay | Admitting: Cardiology

## 2016-08-09 DIAGNOSIS — Z7901 Long term (current) use of anticoagulants: Secondary | ICD-10-CM

## 2016-08-09 DIAGNOSIS — N183 Chronic kidney disease, stage 3 unspecified: Secondary | ICD-10-CM | POA: Diagnosis present

## 2016-08-09 DIAGNOSIS — Z9889 Other specified postprocedural states: Secondary | ICD-10-CM

## 2016-08-09 DIAGNOSIS — I10 Essential (primary) hypertension: Secondary | ICD-10-CM | POA: Diagnosis present

## 2016-08-09 DIAGNOSIS — Z961 Presence of intraocular lens: Secondary | ICD-10-CM | POA: Diagnosis not present

## 2016-08-09 DIAGNOSIS — M109 Gout, unspecified: Secondary | ICD-10-CM | POA: Diagnosis not present

## 2016-08-09 DIAGNOSIS — Z85828 Personal history of other malignant neoplasm of skin: Secondary | ICD-10-CM

## 2016-08-09 DIAGNOSIS — I2510 Atherosclerotic heart disease of native coronary artery without angina pectoris: Secondary | ICD-10-CM | POA: Diagnosis not present

## 2016-08-09 DIAGNOSIS — I4891 Unspecified atrial fibrillation: Secondary | ICD-10-CM | POA: Diagnosis not present

## 2016-08-09 DIAGNOSIS — Z9841 Cataract extraction status, right eye: Secondary | ICD-10-CM | POA: Diagnosis not present

## 2016-08-09 DIAGNOSIS — E1142 Type 2 diabetes mellitus with diabetic polyneuropathy: Secondary | ICD-10-CM | POA: Diagnosis present

## 2016-08-09 DIAGNOSIS — I5032 Chronic diastolic (congestive) heart failure: Secondary | ICD-10-CM | POA: Diagnosis present

## 2016-08-09 DIAGNOSIS — I129 Hypertensive chronic kidney disease with stage 1 through stage 4 chronic kidney disease, or unspecified chronic kidney disease: Secondary | ICD-10-CM | POA: Diagnosis not present

## 2016-08-09 DIAGNOSIS — N179 Acute kidney failure, unspecified: Secondary | ICD-10-CM | POA: Diagnosis not present

## 2016-08-09 DIAGNOSIS — R197 Diarrhea, unspecified: Secondary | ICD-10-CM

## 2016-08-09 DIAGNOSIS — E1122 Type 2 diabetes mellitus with diabetic chronic kidney disease: Secondary | ICD-10-CM | POA: Diagnosis not present

## 2016-08-09 DIAGNOSIS — Z794 Long term (current) use of insulin: Secondary | ICD-10-CM | POA: Diagnosis not present

## 2016-08-09 DIAGNOSIS — E119 Type 2 diabetes mellitus without complications: Secondary | ICD-10-CM

## 2016-08-09 DIAGNOSIS — Z833 Family history of diabetes mellitus: Secondary | ICD-10-CM | POA: Diagnosis not present

## 2016-08-09 DIAGNOSIS — A498 Other bacterial infections of unspecified site: Secondary | ICD-10-CM | POA: Diagnosis present

## 2016-08-09 DIAGNOSIS — Z8249 Family history of ischemic heart disease and other diseases of the circulatory system: Secondary | ICD-10-CM

## 2016-08-09 DIAGNOSIS — I130 Hypertensive heart and chronic kidney disease with heart failure and stage 1 through stage 4 chronic kidney disease, or unspecified chronic kidney disease: Secondary | ICD-10-CM | POA: Diagnosis present

## 2016-08-09 DIAGNOSIS — E11311 Type 2 diabetes mellitus with unspecified diabetic retinopathy with macular edema: Secondary | ICD-10-CM | POA: Diagnosis not present

## 2016-08-09 DIAGNOSIS — K219 Gastro-esophageal reflux disease without esophagitis: Secondary | ICD-10-CM | POA: Diagnosis present

## 2016-08-09 DIAGNOSIS — Z86711 Personal history of pulmonary embolism: Secondary | ICD-10-CM

## 2016-08-09 DIAGNOSIS — N189 Chronic kidney disease, unspecified: Secondary | ICD-10-CM

## 2016-08-09 DIAGNOSIS — E872 Acidosis: Secondary | ICD-10-CM | POA: Diagnosis not present

## 2016-08-09 DIAGNOSIS — A0472 Enterocolitis due to Clostridium difficile, not specified as recurrent: Secondary | ICD-10-CM | POA: Diagnosis not present

## 2016-08-09 DIAGNOSIS — K5100 Ulcerative (chronic) pancolitis without complications: Secondary | ICD-10-CM | POA: Diagnosis not present

## 2016-08-09 DIAGNOSIS — I509 Heart failure, unspecified: Secondary | ICD-10-CM

## 2016-08-09 DIAGNOSIS — Z86718 Personal history of other venous thrombosis and embolism: Secondary | ICD-10-CM | POA: Diagnosis not present

## 2016-08-09 DIAGNOSIS — R5383 Other fatigue: Secondary | ICD-10-CM

## 2016-08-09 DIAGNOSIS — R2689 Other abnormalities of gait and mobility: Secondary | ICD-10-CM

## 2016-08-09 DIAGNOSIS — Z79899 Other long term (current) drug therapy: Secondary | ICD-10-CM | POA: Diagnosis not present

## 2016-08-09 DIAGNOSIS — M25562 Pain in left knee: Secondary | ICD-10-CM | POA: Diagnosis present

## 2016-08-09 DIAGNOSIS — Z9842 Cataract extraction status, left eye: Secondary | ICD-10-CM

## 2016-08-09 DIAGNOSIS — Z8719 Personal history of other diseases of the digestive system: Secondary | ICD-10-CM

## 2016-08-09 DIAGNOSIS — Z888 Allergy status to other drugs, medicaments and biological substances status: Secondary | ICD-10-CM | POA: Diagnosis not present

## 2016-08-09 DIAGNOSIS — Z7982 Long term (current) use of aspirin: Secondary | ICD-10-CM | POA: Diagnosis not present

## 2016-08-09 DIAGNOSIS — M6281 Muscle weakness (generalized): Secondary | ICD-10-CM

## 2016-08-09 DIAGNOSIS — I252 Old myocardial infarction: Secondary | ICD-10-CM

## 2016-08-09 LAB — CBC WITH DIFFERENTIAL/PLATELET
BASOS ABS: 0.1 10*3/uL (ref 0.0–0.1)
Basophils Relative: 0 %
EOS PCT: 3 %
Eosinophils Absolute: 0.6 10*3/uL (ref 0.0–0.7)
HCT: 34.8 % — ABNORMAL LOW (ref 39.0–52.0)
Hemoglobin: 11.4 g/dL — ABNORMAL LOW (ref 13.0–17.0)
LYMPHS PCT: 7 %
Lymphs Abs: 1.4 10*3/uL (ref 0.7–4.0)
MCH: 30.4 pg (ref 26.0–34.0)
MCHC: 32.8 g/dL (ref 30.0–36.0)
MCV: 92.8 fL (ref 78.0–100.0)
Monocytes Absolute: 1.9 10*3/uL — ABNORMAL HIGH (ref 0.1–1.0)
Monocytes Relative: 9 %
NEUTROS ABS: 16.5 10*3/uL — AB (ref 1.7–7.7)
NEUTROS PCT: 81 %
PLATELETS: 218 10*3/uL (ref 150–400)
RBC: 3.75 MIL/uL — AB (ref 4.22–5.81)
RDW: 14.8 % (ref 11.5–15.5)
WBC: 20.5 10*3/uL — AB (ref 4.0–10.5)

## 2016-08-09 LAB — GLUCOSE, CAPILLARY
GLUCOSE-CAPILLARY: 108 mg/dL — AB (ref 65–99)
Glucose-Capillary: 190 mg/dL — ABNORMAL HIGH (ref 65–99)

## 2016-08-09 LAB — BASIC METABOLIC PANEL
ANION GAP: 3 — AB (ref 5–15)
BUN: 53 mg/dL — AB (ref 6–20)
CO2: 24 mmol/L (ref 22–32)
Calcium: 9.5 mg/dL (ref 8.9–10.3)
Chloride: 112 mmol/L — ABNORMAL HIGH (ref 101–111)
Creatinine, Ser: 2 mg/dL — ABNORMAL HIGH (ref 0.61–1.24)
GFR, EST AFRICAN AMERICAN: 34 mL/min — AB (ref >60)
GFR, EST NON AFRICAN AMERICAN: 29 mL/min — AB (ref >60)
Glucose, Bld: 88 mg/dL (ref 65–99)
POTASSIUM: 4 mmol/L (ref 3.5–5.1)
SODIUM: 139 mmol/L (ref 135–145)

## 2016-08-09 LAB — I-STAT CG4 LACTIC ACID, ED: Lactic Acid, Venous: 0.86 mmol/L (ref 0.5–1.9)

## 2016-08-09 MED ORDER — CARVEDILOL 12.5 MG PO TABS
12.5000 mg | ORAL_TABLET | Freq: Two times a day (BID) | ORAL | Status: DC
  Administered 2016-08-09 – 2016-08-16 (×14): 12.5 mg via ORAL
  Filled 2016-08-09 (×14): qty 1

## 2016-08-09 MED ORDER — INSULIN ASPART 100 UNIT/ML ~~LOC~~ SOLN
0.0000 [IU] | Freq: Three times a day (TID) | SUBCUTANEOUS | Status: DC
  Administered 2016-08-10: 2 [IU] via SUBCUTANEOUS
  Administered 2016-08-10: 3 [IU] via SUBCUTANEOUS
  Administered 2016-08-10: 5 [IU] via SUBCUTANEOUS
  Administered 2016-08-11: 3 [IU] via SUBCUTANEOUS
  Administered 2016-08-11 – 2016-08-13 (×4): 2 [IU] via SUBCUTANEOUS
  Administered 2016-08-14: 1 [IU] via SUBCUTANEOUS
  Administered 2016-08-14: 3 [IU] via SUBCUTANEOUS
  Administered 2016-08-15 (×2): 2 [IU] via SUBCUTANEOUS
  Administered 2016-08-16: 1 [IU] via SUBCUTANEOUS
  Administered 2016-08-16: 3 [IU] via SUBCUTANEOUS

## 2016-08-09 MED ORDER — ACETAMINOPHEN 325 MG PO TABS
650.0000 mg | ORAL_TABLET | Freq: Four times a day (QID) | ORAL | Status: DC | PRN
  Administered 2016-08-09 – 2016-08-10 (×2): 650 mg via ORAL
  Filled 2016-08-09 (×2): qty 2

## 2016-08-09 MED ORDER — ONDANSETRON HCL 4 MG/2ML IJ SOLN
4.0000 mg | Freq: Four times a day (QID) | INTRAMUSCULAR | Status: DC | PRN
  Administered 2016-08-16: 4 mg via INTRAVENOUS
  Filled 2016-08-09: qty 2

## 2016-08-09 MED ORDER — ACETAMINOPHEN 650 MG RE SUPP
650.0000 mg | Freq: Four times a day (QID) | RECTAL | Status: DC | PRN
Start: 2016-08-09 — End: 2016-08-16

## 2016-08-09 MED ORDER — APIXABAN 2.5 MG PO TABS
2.5000 mg | ORAL_TABLET | Freq: Two times a day (BID) | ORAL | Status: DC
  Administered 2016-08-09 – 2016-08-16 (×14): 2.5 mg via ORAL
  Filled 2016-08-09 (×14): qty 1

## 2016-08-09 MED ORDER — SODIUM CHLORIDE 0.9 % IV BOLUS (SEPSIS)
500.0000 mL | Freq: Once | INTRAVENOUS | Status: AC
  Administered 2016-08-09: 500 mL via INTRAVENOUS

## 2016-08-09 MED ORDER — TRAMADOL HCL 50 MG PO TABS
50.0000 mg | ORAL_TABLET | Freq: Four times a day (QID) | ORAL | Status: DC | PRN
  Administered 2016-08-11 – 2016-08-16 (×5): 50 mg via ORAL
  Filled 2016-08-09 (×5): qty 1

## 2016-08-09 MED ORDER — SODIUM CHLORIDE 0.9 % IV SOLN
INTRAVENOUS | Status: DC
  Administered 2016-08-09: 15:00:00 via INTRAVENOUS

## 2016-08-09 MED ORDER — ACETAMINOPHEN 500 MG PO TABS: 500.0000 mg | ORAL_TABLET | Freq: Four times a day (QID) | ORAL | Status: DC | PRN

## 2016-08-09 MED ORDER — ASPIRIN EC 81 MG PO TBEC
81.0000 mg | DELAYED_RELEASE_TABLET | Freq: Every morning | ORAL | Status: DC
  Administered 2016-08-10 – 2016-08-16 (×7): 81 mg via ORAL
  Filled 2016-08-09 (×7): qty 1

## 2016-08-09 MED ORDER — GABAPENTIN 100 MG PO CAPS
100.0000 mg | ORAL_CAPSULE | Freq: Three times a day (TID) | ORAL | Status: DC
  Administered 2016-08-09 – 2016-08-16 (×21): 100 mg via ORAL
  Filled 2016-08-09 (×21): qty 1

## 2016-08-09 MED ORDER — SODIUM CHLORIDE 0.9 % IV SOLN
Freq: Once | INTRAVENOUS | Status: AC
  Administered 2016-08-09: 14:00:00 via INTRAVENOUS

## 2016-08-09 MED ORDER — PANTOPRAZOLE SODIUM 40 MG PO TBEC
40.0000 mg | DELAYED_RELEASE_TABLET | Freq: Every day | ORAL | Status: DC
  Administered 2016-08-09 – 2016-08-16 (×8): 40 mg via ORAL
  Filled 2016-08-09 (×8): qty 1

## 2016-08-09 MED ORDER — ONDANSETRON HCL 4 MG PO TABS: 4.0000 mg | ORAL_TABLET | Freq: Four times a day (QID) | ORAL | Status: DC | PRN

## 2016-08-09 MED ORDER — VANCOMYCIN 50 MG/ML ORAL SOLUTION
125.0000 mg | Freq: Four times a day (QID) | ORAL | Status: DC
  Administered 2016-08-09 – 2016-08-10 (×4): 125 mg via ORAL
  Filled 2016-08-09 (×5): qty 2.5

## 2016-08-09 MED ORDER — VANCOMYCIN 50 MG/ML ORAL SOLUTION
ORAL | Status: AC
  Filled 2016-08-09: qty 5

## 2016-08-09 MED ORDER — ATORVASTATIN CALCIUM 10 MG PO TABS
10.0000 mg | ORAL_TABLET | Freq: Every day | ORAL | Status: DC
Start: 2016-08-09 — End: 2016-08-16
  Administered 2016-08-09 – 2016-08-15 (×7): 10 mg via ORAL
  Filled 2016-08-09 (×8): qty 1

## 2016-08-09 NOTE — ED Provider Notes (Addendum)
[place] DEPT Provider Note   CSN: [id number] Arrival date & time: 08/09/16  0910  By signing my name below, I, Rayna Sexton, attest that this documentation has been prepared under the direction and in the presence of Elnora Morrison, MD. Electronically Signed: Rayna Sexton, ED Scribe. 08/09/16. 9:41 AM.   History   Chief Complaint Chief Complaint  Patient presents with  . Diarrhea    HPI HPI Comments: Zachary Mcmahon is a 82 y.o. male who presents to the Emergency Department complaining of persistent moderate diarrhea x 1 month. Pt states he has been evaluated by his PCP as well as at an UC. He states a stool sample was obtained at [facility] and "was negative". He reports 5-7 episodes of diarrhea per day. He has been taking imodium kaopectate and pepto bismol w/o significant relief. He reports associated fatigue and weakness. He states he took a course of abx in October of 2017. No h/o c-diff. Pt denies any recent sick contacts. No recent travel. He is on anticoagulation. His relative confirms his listed medical history. He denies bloody stools, vomiting, fevers, chills and HA.  The history is provided by the patient, a relative and medical records. No language interpreter was used.   Past Medical History:  Diagnosis Date  . Arthritis    "legs" (02/07/2015)  . Basal cell carcinoma of forehead 2016 X 2  . Basal cell carcinoma of left earlobe   . CHF (congestive heart failure) ([place])   . Diabetic peripheral neuropathy ([place])    "left foot" (02/07/2015)  . GERD (gastroesophageal reflux disease)   . High potassium   . History of gout X 1  . Hyperlipidemia   . Hypertension   . Old myocardial infarct    "sometime in the past; don't know when" (02/07/2015)  . Pain and swelling of left lower leg    chronic  . Pneumonia X 1  . Renal insufficiency   . Respiratory failure ([place])   . Type II diabetes mellitus ([place])   . Varicose veins     Patient Active Problem List   Diagnosis Date  Noted  . C. difficile colitis 08/09/2016  . Acute on chronic combined systolic and diastolic CHF (congestive heart failure) ([place]) 10/03/2015  . Chronic anticoagulation 10/03/2015  . CKD (chronic kidney disease), stage III 10/03/2015  . Chronic kidney disease (CKD), stage IV (severe) ([place]) 03/05/2015  . Acute pulmonary embolism ([place]) 03/04/2015  . Pulmonary embolism ([place]) 03/03/2015  . 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 Pressure ulcer 02/14/2015  . CHF (congestive heart failure) ([place])   . Renal insufficiency   . Acute respiratory failure with hypoxia ([place]) 02/12/2015  . Arterial hypotension   . Pulmonary edema   . Acute on chronic combined systolic and diastolic HF (heart failure) ([place])   . Abdominal distension   . Acute renal failure superimposed on stage 3 chronic kidney disease ([place])   . NSTEMI (non-ST elevated myocardial infarction) ([place])   . Non-STEMI (non-ST elevated myocardial infarction) ([place]) 02/07/2015  . Unstable angina ([place]) 02/06/2015  . CKD (chronic kidney disease) stage 3, GFR 30-59 ml/min 02/06/2015  . Chest pain 02/06/2015  . Abdominal pain, acute, generalized 02/06/2015  . Diarrhea 02/06/2015  . Swelling of extremity, left, chronic 02/06/2015  . Ileus ([place]) 02/06/2015  . Hyperlipidemia 04/23/2014  . Coronary artery disease 04/23/2014  . Varicose veins of lower extremities with other complications 25/00/3704  . Unspecified hemorrhoids with other complication 88/89/1694  . Intertrochanteric fracture of right hip (  [place]) 03/20/2012  . HTN (hypertension), malignant 03/20/2012  . DM type 2 (diabetes mellitus, type 2) ([place]) 03/20/2012  . Acute renal failure ([place]) 03/20/2012  . Thrombocytopenia ([place]) 03/20/2012  . Hyperkalemia 03/20/2012    Past Surgical History:  Procedure Laterality Date  . BASAL CELL CARCINOMA EXCISION     "probably 1/2 dozen cut off face, left ear" (02/07/2015)  . CARDIAC CATHETERIZATION N/A 02/09/2015   Procedure: Left Heart Cath and Coronary  Angiography;  Surgeon: Leonie Man, MD;  Location: [place] [facility];  Service: Cardiovascular;  Laterality: N/A;  . CARDIOVASCULAR STRESS TEST  05/29/2012   Mild-moderate perfusion defect due to infarct/scar with mild-moderate perinfarct ischemia seen in the mid anterior, apical anterior, apical septal, and apical regions. No ECG changes. Global LV systolic function is severely reduced.  Marland Kitchen CATARACT EXTRACTION W/PHACO Left 11/12/2015   Procedure: CATARACT EXTRACTION PHACO AND INTRAOCULAR LENS PLACEMENT LEFT EYE;  Surgeon: Tonny Branch, MD;  Location: [facility];  Service: Ophthalmology;  Laterality: Left;  CDE: 9.82  . CATARACT EXTRACTION W/PHACO Right 12/14/2015   Procedure: CATARACT EXTRACTION PHACO AND INTRAOCULAR LENS PLACEMENT (IOC);  Surgeon: Tonny Branch, MD;  Location: [facility];  Service: Ophthalmology;  Laterality: Right;  CDE: 13.86  . CORONARY ARTERY BYPASS GRAFT N/A 02/17/2015   Procedure: CORONARY ARTERY BYPASS GRAFTING (CABG), ON PUMP, TIMES FIVE, USING LEFT INTERNAL MAMMARY ARTERY, RIGHT GREATER SAPHENOUS VEIN HARVESTED ENDOSCOPICALLY;  Surgeon: [name], MD;  Location: [place];  Service: Open Heart Surgery;  Laterality: N/A;  -LIMA to LAD -SVG to DIAGONAL - SEQ SVG to OM1 and PLB -SVG to PDA  . CYSTOSCOPY W/ STONE MANIPULATION  X 1  . FRACTURE SURGERY    . KNEE ARTHROSCOPY Right ~ 2008  . LEXISCAM MYOCARDIAL PERFUSION  05/29/12   MARKED PERFUSION DEFECT DUE TO INFARC/SCAR WITH MILD PERINFARCT ISCHEMIA IN THE BASAL INFERIOR, MID INFEROSEPTAL, MID INFERIOR AND APICALINFERIOR REGION. EF%33%. PERIINFARCT ISCHEMIA IN THE MID ANTERIOR, APICAL ANTERIOR, APICAL SEPTAL AND APICAL REGIONS.  . LOWER EXTREMITY VENOUS DOPPLER  07/11/2012   No evidence of DVT in the left lower extremity. Evidence of partially recanalized, chronic, non-obstructive thrombus in the left great SV and its branches consistent with significant reflux consistent with post-phlebitic syndrome. Significant reflux of the left  short saphenous vein.  Marland Kitchen open heart sx  02/17/15  . ORIF HIP FRACTURE  03/20/2012   Procedure: OPEN REDUCTION INTERNAL FIXATION HIP;  Surgeon: Sanjuana Kava, MD;  Location: [facility];  Service: Orthopedics;  Laterality: Right;  . TEE WITHOUT CARDIOVERSION N/A 02/17/2015   Procedure: TRANSESOPHAGEAL ECHOCARDIOGRAM (TEE);  Surgeon: [name], MD;  Location: [place];  Service: Open Heart Surgery;  Laterality: N/A;  . TRANSTHORACIC ECHOCARDIOGRAM  04/18/2012   EF 45%, mild-moderate LVH  . TRANSTHORACIC ECHOCARDIOGRAM  04/18/12   EF% 45%.SEVERE HYPOKINESIS TO AKINESIS OF THE MID-DISTAL INFEROLATERAL MYOCARDIUM AND MUCH OF THE APEX.       Home Medications    Prior to Admission medications   Medication Sig Start Date End Date Taking? Authorizing Provider  acetaminophen (TYLENOL) 500 MG tablet Take 500 mg by mouth every 6 (six) hours as needed for mild pain.   Yes Historical Provider, MD  apixaban (ELIQUIS) 2.5 MG TABS tablet Take 1 tablet (2.5 mg total) by mouth 2 (two) times daily. 05/03/16  Yes Herminio Commons, MD  aspirin EC 81 MG tablet Take 81 mg by mouth every morning.   Yes Historical Provider, MD  atorvastatin (LIPITOR) 10 MG  tablet Take 10 mg by mouth daily.   Yes Historical Provider, MD  Blood Pressure Monitoring (OMRON 7 SERIES BP MONITOR) DEVI 1 Units by Does not apply route as needed. 07/04/16  Yes Arnoldo Lenis, MD  carvedilol (COREG) 6.25 MG tablet Take 1 tablet (6.25 mg total) by mouth 2 (two) times daily. Patient taking differently: Take 12.5 mg by mouth 2 (two) times daily.  07/18/16  Yes Arnoldo Lenis, MD  diclofenac sodium (VOLTAREN) 1 % GEL Apply 2 g topically 2 (two) times daily.   Yes Historical Provider, MD  ferrous sulfate 325 (65 FE) MG tablet Take 1 tablet (325 mg total) by mouth daily with breakfast. 02/24/15  Yes Donielle Liston Alba, PA-C  furosemide (LASIX) 40 MG tablet Take one tab (40mg ) by mouth every morning & 1/2 tab (20mg ) every evening 05/03/16  Yes  Herminio Commons, MD  gabapentin (NEURONTIN) 100 MG capsule Take 100 mg by mouth 3 (three) times daily.  04/01/14  Yes Historical Provider, MD  LEVEMIR FLEXTOUCH 100 UNIT/ML Pen Inject 27 Units into the skin at bedtime. Patient taking differently: Inject 30 Units into the skin at bedtime.  03/05/15  Yes Lezlie Octave Black, NP  losartan (COZAAR) 100 MG tablet Take 100 mg by mouth daily.   Yes Historical Provider, MD  omeprazole (PRILOSEC) 20 MG capsule Take 20 mg by mouth daily.   Yes Historical Provider, MD  traMADol (ULTRAM) 50 MG tablet Take 50 mg by mouth every 6 (six) hours as needed for moderate pain.   Yes Historical Provider, MD    Family History Family History  Problem Relation Age of Onset  . Deep vein thrombosis Mother   . Hypertension Mother   . Hypertension Sister   . Diabetes Brother   . Hyperlipidemia Brother   . Hypertension Brother   . Heart attack Brother     Social History Social History  Substance Use Topics  . Smoking status: Never Smoker  . Smokeless tobacco: Never Used  . Alcohol use No    Allergies   Bactrim [sulfamethoxazole-trimethoprim]   Review of Systems Review of Systems  Constitutional: Positive for fatigue. Negative for chills and fever.  Gastrointestinal: Positive for diarrhea. Negative for abdominal pain, anal bleeding, blood in stool, constipation, nausea and vomiting.  Neurological: Positive for weakness. Negative for headaches.   Physical Exam Updated Vital Signs BP 144/56   Pulse 62   Temp 98.2 F (36.8 C) (Oral)   Resp 16   Ht 5' 9.5" (1.765 m)   Wt 188 lb (85.3 kg)   SpO2 100%   BMI 27.36 kg/m   Physical Exam  Constitutional: He is oriented to person, place, and time. He appears well-developed.  Generally fatigued appearance   HENT:  Head: Normocephalic and atraumatic.  Mouth/Throat: Mucous membranes are dry.  Eyes: EOM are normal.  Neck: Normal range of motion.  Cardiovascular: Normal rate, regular rhythm, normal heart  sounds and intact distal pulses.   Pulmonary/Chest: Effort normal and breath sounds normal. No respiratory distress.  Abdominal: Soft. He exhibits no distension. There is tenderness (mild diffuse). There is no guarding.  No peritonitis.  Large inguinal hernia left scrotum and groin nontender soft  Musculoskeletal: Normal range of motion. He exhibits no edema.  No leg swelling noted  Neurological: He is alert and oriented to person, place, and time.  Skin: Skin is warm and dry.  Psychiatric: He has a normal mood and affect. Judgment normal.  Nursing note and vitals reviewed.  ED Treatments / Results  Labs (all labs ordered are listed, but only abnormal results are displayed) Labs Reviewed  BASIC METABOLIC PANEL - Abnormal; Notable for the following:       Result Value   Chloride 112 (*)    BUN 53 (*)    Creatinine, Ser 2.00 (*)    GFR calc non Af Amer 29 (*)    GFR calc Af Amer 34 (*)    Anion gap 3 (*)    All other components within normal limits  CBC WITH DIFFERENTIAL/PLATELET - Abnormal; Notable for the following:    WBC 20.5 (*)    RBC 3.75 (*)    Hemoglobin 11.4 (*)    HCT 34.8 (*)    Neutro Abs 16.5 (*)    Monocytes Absolute 1.9 (*)    All other components within normal limits  C DIFFICILE QUICK SCREEN W PCR REFLEX  I-STAT CG4 LACTIC ACID, ED    EKG  EKG Interpretation None       Radiology No results found.  Procedures Procedures  DIAGNOSTIC STUDIES: Oxygen Saturation is 95% on RA, adequate by my interpretation.    COORDINATION OF CARE: 9:41 AM Discussed next steps with pt. Pt verbalized understanding and is agreeable with the plan.    Medications Ordered in ED Medications  vancomycin (VANCOCIN) 50 mg/mL oral solution 125 mg (not administered)  0.9 %  sodium chloride infusion (not administered)  sodium chloride 0.9 % bolus 500 mL (0 mLs Intravenous Stopped 08/09/16 1050)     Initial Impression / Assessment and Plan / ED Course  I have reviewed  the triage vital signs and the nursing notes.  Pertinent labs & imaging results that were available during my care of the patient were reviewed by me and considered in my medical decision making (see chart for details).  Clinical Course    Patient presents with unfortunate recurrent diarrhea from [place] to month. Plan to try to obtain stool specimens that were done at the urgent care. Patient was on antibiotics in October however was told his stool samples are negative. Patient has fatigue and clinical dehydration. Plan for IV fluids, blood work and reassessment. Patient improved on reassessment. plan for repeat fluid bolus. Reviewed recent patient records Giardia, Crypto both neg,  C diff PCR pos  Plan for CT abdo, abx  Discussed the admission with triad hospitalist. Patient has pancolitis on the CT without perforation or abscess. Patient has chronic inguinal hernia without tenderness.  The patients results and plan were reviewed and discussed.   Any x-rays performed were independently reviewed by myself.   Differential diagnosis were considered with the presenting HPI.  Medications  vancomycin (VANCOCIN) 50 mg/mL oral solution 125 mg (not administered)  0.9 %  sodium chloride infusion (not administered)  sodium chloride 0.9 % bolus 500 mL (0 mLs Intravenous Stopped 08/09/16 1050)    Vitals:   08/09/16 1100 08/09/16 1130 08/09/16 1200 08/09/16 1230  BP: 147/64 135/58 137/57 144/56  Pulse: 66 65 65 62  Resp:      Temp:      TempSrc:      SpO2: 98% 98% 97% 100%  Weight:      Height:        Final diagnoses:  Diarrhea, unspecified type  Other fatigue  C. difficile colitis  Chronic renal failure, unspecified CKD stage  History of left inguinal hernia    Admission/ observation were discussed with the admitting physician, patient and/or family and they are comfortable  with the plan.    Final Clinical Impressions(s) / ED Diagnoses   Final diagnoses:  Diarrhea, unspecified type   Other fatigue  C. difficile colitis  Chronic renal failure, unspecified CKD stage  History of left inguinal hernia  Leukocytosis CRF   New Prescriptions New Prescriptions   No medications on file     Elnora Morrison, MD 08/09/16 1344    Elnora Morrison, MD 08/09/16 1345

## 2016-08-09 NOTE — ED Triage Notes (Signed)
Diarrhea for "several days".  Off and on abdominal pain.

## 2016-08-09 NOTE — H&P (Signed)
History and Physical    Zachary Mcmahon OIN:[id number] DOB: 1932/06/24 DOA: 08/09/2016  PCP: Zachary Neighbors, MD  Patient coming from: Home   Chief Complaint: Diarrhea.   HPI: Zachary Mcmahon is a 82 y.o. male with medical history significant of DM, CKD stage II Cr baseline 1.8, HTN who presents complaining of persistent diarrhea for 6 weeks. He has been evaluated by PCP and UC. He has stool test done which were 'negative'. He took antibiotics last oct for infection. Dr Zachary Mcmahon obtained record from [place], which showed positive C diff PCR. Patient report 5 Watery Bowel movement per day. Report fatigue. He has not been taking his lasix.   ED Course:  WBC 20, lactic acid 0.8, cr at 2,   Review of Systems: As per HPI otherwise 10 point review of systems negative. CT scan; Significant diffuse pancolitis. This may be infectious in origin. Result of ischemia felt to be a secondary less likely consideration. Small amount of free fluid extending superior to the liver and spleen. Currently no well-defined drainable abscess or free intraperitoneal air.   Past Medical History:  Diagnosis Date  . Arthritis    "legs" (02/07/2015)  . Basal cell carcinoma of forehead 2016 X 2  . Basal cell carcinoma of left earlobe   . CHF (congestive heart failure) ([place])   . Diabetic peripheral neuropathy ([place])    "left foot" (02/07/2015)  . GERD (gastroesophageal reflux disease)   . High potassium   . History of gout X 1  . Hyperlipidemia   . Hypertension   . Old myocardial infarct    "sometime in the past; don't know when" (02/07/2015)  . Pain and swelling of left lower leg    chronic  . Pneumonia X 1  . Renal insufficiency   . Respiratory failure ([place])   . Type II diabetes mellitus ([place])   . Varicose veins     Past Surgical History:  Procedure Laterality Date  . BASAL CELL CARCINOMA EXCISION     "probably 1/2 dozen cut off face, left ear" (02/07/2015)  . CARDIAC CATHETERIZATION N/A 02/09/2015   Procedure: Left  Heart Cath and Coronary Angiography;  Surgeon: Leonie Man, MD;  Location: [facility];  Service: Cardiovascular;  Laterality: N/A;  . CARDIOVASCULAR STRESS TEST  05/29/2012   Mild-moderate perfusion defect due to infarct/scar with mild-moderate perinfarct ischemia seen in the mid anterior, apical anterior, apical septal, and apical regions. No ECG changes. Global LV systolic function is severely reduced.  Marland Kitchen CATARACT EXTRACTION W/PHACO Left 11/12/2015   Procedure: CATARACT EXTRACTION PHACO AND INTRAOCULAR LENS PLACEMENT LEFT EYE;  Surgeon: Tonny [place], MD;  Location: [facility];  Service: Ophthalmology;  Laterality: Left;  CDE: 9.82  . CATARACT EXTRACTION W/PHACO Right 12/14/2015   Procedure: CATARACT EXTRACTION PHACO AND INTRAOCULAR LENS PLACEMENT (IOC);  Surgeon: Tonny [place], MD;  Location: [facility];  Service: Ophthalmology;  Laterality: Right;  CDE: 13.86  . CORONARY ARTERY BYPASS GRAFT N/A 02/17/2015   Procedure: CORONARY ARTERY BYPASS GRAFTING (CABG), ON PUMP, TIMES FIVE, USING LEFT INTERNAL MAMMARY ARTERY, RIGHT GREATER SAPHENOUS VEIN HARVESTED ENDOSCOPICALLY;  Surgeon: [name], MD;  Location: [facility];  Service: Open Heart Surgery;  Laterality: N/A;  -LIMA to LAD -SVG to DIAGONAL - SEQ SVG to OM1 and PLB -SVG to PDA  . CYSTOSCOPY W/ STONE MANIPULATION  X 1  . FRACTURE SURGERY    . KNEE ARTHROSCOPY Right ~ 2008  . [place] MYOCARDIAL PERFUSION  05/29/12   MARKED  PERFUSION DEFECT DUE TO INFARC/SCAR WITH MILD PERINFARCT ISCHEMIA IN THE BASAL INFERIOR, MID INFEROSEPTAL, MID INFERIOR AND APICALINFERIOR REGION. EF%33%. PERIINFARCT ISCHEMIA IN THE MID ANTERIOR, APICAL ANTERIOR, APICAL SEPTAL AND APICAL REGIONS.  . LOWER EXTREMITY VENOUS DOPPLER  07/11/2012   No evidence of DVT in the left lower extremity. Evidence of partially recanalized, chronic, non-obstructive thrombus in the left great SV and its branches consistent with significant reflux consistent with post-phlebitic syndrome.  Significant reflux of the left short saphenous vein.  Marland Kitchen open heart sx  02/17/15  . ORIF HIP FRACTURE  03/20/2012   Procedure: OPEN REDUCTION INTERNAL FIXATION HIP;  Surgeon: Sanjuana Kava, MD;  Location: [facility];  Service: Orthopedics;  Laterality: Right;  . TEE WITHOUT CARDIOVERSION N/A 02/17/2015   Procedure: TRANSESOPHAGEAL ECHOCARDIOGRAM (TEE);  Surgeon: [name], MD;  Location: [facility];  Service: Open Heart Surgery;  Laterality: N/A;  . TRANSTHORACIC ECHOCARDIOGRAM  04/18/2012   EF 45%, mild-moderate LVH  . TRANSTHORACIC ECHOCARDIOGRAM  04/18/12   EF% 45%.SEVERE HYPOKINESIS TO AKINESIS OF THE MID-DISTAL INFEROLATERAL MYOCARDIUM AND MUCH OF THE APEX.     reports that he has never smoked. He has never used smokeless tobacco. He reports that he does not drink alcohol or use drugs.  Allergies  Allergen Reactions  . Bactrim [Sulfamethoxazole-Trimethoprim] Rash    Family History  Problem Relation Age of Onset  . Deep vein thrombosis Mother   . Hypertension Mother   . Hypertension Sister   . Diabetes Brother   . Hyperlipidemia Brother   . Hypertension Brother   . Heart attack Brother      Prior to Admission medications   Medication Sig Start Date End Date Taking? Authorizing Provider  acetaminophen (TYLENOL) 500 MG tablet Take 500 mg by mouth every 6 (six) hours as needed for mild pain.   Yes Historical Provider, MD  apixaban (ELIQUIS) 2.5 MG TABS tablet Take 1 tablet (2.5 mg total) by mouth 2 (two) times daily. 05/03/16  Yes Herminio Commons, MD  aspirin EC 81 MG tablet Take 81 mg by mouth every morning.   Yes Historical Provider, MD  atorvastatin (LIPITOR) 10 MG tablet Take 10 mg by mouth daily.   Yes Historical Provider, MD  Blood Pressure Monitoring (OMRON 7 SERIES BP MONITOR) DEVI 1 Units by Does not apply route as needed. 07/04/16  Yes Arnoldo Lenis, MD  carvedilol (COREG) 6.25 MG tablet Take 1 tablet (6.25 mg total) by mouth 2 (two) times daily. Patient taking  differently: Take 12.5 mg by mouth 2 (two) times daily.  07/18/16  Yes Arnoldo Lenis, MD  diclofenac sodium (VOLTAREN) 1 % GEL Apply 2 g topically 2 (two) times daily.   Yes Historical Provider, MD  ferrous sulfate 325 (65 FE) MG tablet Take 1 tablet (325 mg total) by mouth daily with breakfast. 02/24/15  Yes Donielle Liston Alba, PA-C  furosemide (LASIX) 40 MG tablet Take one tab (40mg ) by mouth every morning & 1/2 tab (20mg ) every evening 05/03/16  Yes Herminio Commons, MD  gabapentin (NEURONTIN) 100 MG capsule Take 100 mg by mouth 3 (three) times daily.  04/01/14  Yes Historical Provider, MD  LEVEMIR FLEXTOUCH 100 UNIT/ML Pen Inject 27 Units into the skin at bedtime. Patient taking differently: Inject 30 Units into the skin at bedtime.  03/05/15  Yes Lezlie Octave Black, NP  losartan (COZAAR) 100 MG tablet Take 100 mg by mouth daily.   Yes Historical Provider, MD  omeprazole (PRILOSEC) 20 MG capsule  Take 20 mg by mouth daily.   Yes Historical Provider, MD  traMADol (ULTRAM) 50 MG tablet Take 50 mg by mouth every 6 (six) hours as needed for moderate pain.   Yes Historical Provider, MD    Physical Exam: Vitals:   08/09/16 1130 08/09/16 1200 08/09/16 1230 08/09/16 1442  BP: 135/58 137/57 144/56 (!) 155/67  Pulse: 65 65 62 71  Resp:    18  Temp:    98.5 F (36.9 C)  TempSrc:    Oral  SpO2: 98% 97% 100% 98%  Weight:      Height:          Constitutional: NAD, calm, comfortable Vitals:   08/09/16 1130 08/09/16 1200 08/09/16 1230 08/09/16 1442  BP: 135/58 137/57 144/56 (!) 155/67  Pulse: 65 65 62 71  Resp:    18  Temp:    98.5 F (36.9 C)  TempSrc:    Oral  SpO2: 98% 97% 100% 98%  Weight:      Height:       Eyes: PERRL, lids and conjunctivae normal ENMT: Mucous membranes are moist. Posterior pharynx clear of any exudate or lesions.Normal dentition.  Neck: normal, supple, no masses, no thyromegaly Respiratory: clear to auscultation bilaterally, no wheezing, no crackles. Normal  respiratory effort. No accessory muscle use.  Cardiovascular: Regular rate and rhythm, no murmurs / rubs / gallops. No extremity edema. 2+ pedal pulses. No carotid bruits.  Abdomen:mild  Tenderness, mild distended,  no masses palpated. No hepatosplenomegaly. Bowel sounds positive.  Musculoskeletal: no clubbing / cyanosis. No joint deformity upper and lower extremities. Good ROM, no contractures. Normal muscle tone.  Skin: no rashes, lesions, ulcers. No induration Neurologic: CN 2-12 grossly intact. Sensation intact, DTR normal. Strength 5/5 in all 4.  Psychiatric: Normal judgment and insight. Alert and oriented x 3. Normal mood.     Labs on Admission: I have personally reviewed following labs and imaging studies  CBC:  Recent Labs Lab 08/09/16 1009  WBC 20.5*  NEUTROABS 16.5*  HGB 11.4*  HCT 34.8*  MCV 92.8  PLT 841   Basic Metabolic Panel:  Recent Labs Lab 08/09/16 1009  NA 139  K 4.0  CL 112*  CO2 24  GLUCOSE 88  BUN 53*  CREATININE 2.00*  CALCIUM 9.5   GFR: Estimated Creatinine Clearance: 29 mL/min (by C-G formula based on SCr of 2 mg/dL (H)). Liver Function Tests: No results for input(s): AST, ALT, ALKPHOS, BILITOT, PROT, ALBUMIN in the last 168 hours. No results for input(s): LIPASE, AMYLASE in the last 168 hours. No results for input(s): AMMONIA in the last 168 hours. Coagulation Profile: No results for input(s): INR, PROTIME in the last 168 hours. Cardiac Enzymes: No results for input(s): CKTOTAL, CKMB, CKMBINDEX, TROPONINI in the last 168 hours. BNP (last 3 results) No results for input(s): PROBNP in the last 8760 hours. HbA1C: No results for input(s): HGBA1C in the last 72 hours. CBG: No results for input(s): GLUCAP in the last 168 hours. Lipid Profile: No results for input(s): CHOL, HDL, LDLCALC, TRIG, CHOLHDL, LDLDIRECT in the last 72 hours. Thyroid Function Tests: No results for input(s): TSH, T4TOTAL, FREET4, T3FREE, THYROIDAB in the last 72  hours. Anemia Panel: No results for input(s): VITAMINB12, FOLATE, FERRITIN, TIBC, IRON, RETICCTPCT in the last 72 hours. Urine analysis:    Component Value Date/Time   COLORURINE YELLOW 05/19/2016 0423   APPEARANCEUR CLEAR 05/19/2016 0423   LABSPEC 1.020 05/19/2016 0423   PHURINE 7.5 05/19/2016 0423  GLUCOSEU 100 (A) 05/19/2016 0423   HGBUR SMALL (A) 05/19/2016 0423   BILIRUBINUR NEGATIVE 05/19/2016 0423   KETONESUR NEGATIVE 05/19/2016 0423   PROTEINUR >300 (A) 05/19/2016 0423   UROBILINOGEN 0.2 02/17/2015 1401   NITRITE NEGATIVE 05/19/2016 0423   LEUKOCYTESUR NEGATIVE 05/19/2016 0423   Sepsis Labs: !!!!!!!!!!!!!!!!!!!!!!!!!!!!!!!!!!!!!!!!!!!! @LABRCNTIP (procalcitonin:4,lacticidven:4) )No results found for this or any previous visit (from the past 240 hour(s)).   Radiological Exams on Admission: Ct Renal Stone Study  Result Date: 08/09/2016 CLINICAL DATA:  82 year old hypertensive male with diarrhea for the past month. Initial encounter. EXAM: CT ABDOMEN AND PELVIS WITHOUT CONTRAST TECHNIQUE: Multidetector CT imaging of the abdomen and pelvis was performed following the standard protocol without IV contrast. COMPARISON:  None. FINDINGS: Lower chest: No worrisome lung base abnormality. Cardiomegaly. Coronary artery calcifications. Hepatobiliary: Taking into account limitation by non contrast imaging, no worrisome hepatic lesion. Gallstones. Pancreas: Taking into account limitation by non contrast imaging, no pancreatic inflammation or mass. Spleen: Taking into account limitation by non contrast imaging, no enlargement or mass. Adrenals/Urinary Tract: No renal or ureteral obstructing stone. Bilateral renal cysts are noted. On the left, 2.4 cm indeterminate lesion. On the right, inferior pole 6 mm and 4 mm hyperdense lesion possibly hemorrhagic cysts. Indeterminate left renal lesion can be assessed if the patient's acute episode has cleared. Mild adrenal hyperplasia. Stomach/Bowel: Diffuse  inflammation of the colon consistent with pancolitis with inflammation of surrounding fat planes and small amount of free fluid extending superior to the liver and spleen. Currently no well-defined drainable abscess or free intraperitoneal air. Inflammatory process is not centered around the appendix. Under distended stomach without abnormality noted. Vascular/Lymphatic: Calcification of the aorta, common iliac arteries (with moderate narrowing), femoral arteries and aortic branch vessels. No adenopathy Reproductive: Enlarged prostate gland impresses upon the bladder base. Clinical laboratory correlation recommended. Other: Large left inguinal fat and vessel containing hernia with small amount of fluid along the inferior aspect which extends into the scrotum. Musculoskeletal: Degenerative changes lumbar spine most notable L5-S1. Remote Schmorl's node deformity superior endplate L1. Prior right hip surgery. IMPRESSION: Significant diffuse pancolitis. This may be infectious in origin. Result of ischemia felt to be a secondary less likely consideration. Small amount of free fluid extending superior to the liver and spleen. Currently no well-defined drainable abscess or free intraperitoneal air. Large left inguinal fat and vessel containing hernia with small amount of fluid along the inferior aspect which extends into the scrotum. 2.4 cm indeterminate left renal lesion. This can be assessed on follow-up after the patient's colitis has been treated. Question 4 mm and 6 mm hyperdense cyst inferior aspect right kidney. Enlarged prostate gland impresses upon the bladder base. Clinical and laboratory correlation recommended. Gallstones. Cardiomegaly with coronary artery calcification. Calcification of the aorta, common iliac arteries (with moderate narrowing), femoral arteries and aortic branch vessels. These results were called by telephone at the time of interpretation on 08/09/2016 at 1:39 pm to Dr. Elnora Morrison , who  verbally acknowledged these results. Electronically Signed   By: Genia Del M.D.   On: 08/09/2016 13:50    EKG: none available.   Assessment/Plan Principal Problem:   C. difficile colitis Active Problems:   HTN (hypertension), malignant   DM type 2 (diabetes mellitus, type 2) (HCC)   Acute renal failure ([facility]CC)   CKD (chronic kidney disease) stage 3, GFR 30-59 ml/min   Diarrhea   CHF (congestive heart failure) ([facility])  1-C diff colitis;  Patient presents with diarrhea, leukocytosis, CT scan showed  pan-colitis. Test from UC from 12-5 with positive C diff PCR.  Continue with oral vancomycin, started in the ED.  IV fluids.  Check for C diff.    2-Acute on CKD stage III; Cr at 2. Per record last cr at 1.8.  Hold Cozaar and lasix.  IV fluids.   3-A fib; continue with eliquis and coreg.   4-DM; hold levemir, poor oral intake. SSI.  5-Chronic Diastolic HF; hold lasix in setting of Diarrhea and worsening renal function.  6- 2.4 cm indeterminate left renal lesion; needs follow up.      DVT prophylaxis: on eliquis.  Code Status: full code.  Family Communication: care discussed with patient.  Disposition Plan: home in 48 to 72 hours.  Consults called: none Admission status: inpatient, med-surgery    Elmarie Shiley MD [facility] Pager [phone]  If 7PM-7AM, please contact night-coverage [email or web address] Password [facility]  08/09/2016, 4:39 PM

## 2016-08-09 NOTE — ED Triage Notes (Signed)
Per family in room.  Pt has diarrhea for a while.  Has seen PCP.  Seen at urgent care recently and had negative stool specimen.  States was on antibiodics in October.

## 2016-08-10 LAB — GLUCOSE, CAPILLARY
GLUCOSE-CAPILLARY: 235 mg/dL — AB (ref 65–99)
GLUCOSE-CAPILLARY: 255 mg/dL — AB (ref 65–99)
Glucose-Capillary: 185 mg/dL — ABNORMAL HIGH (ref 65–99)
Glucose-Capillary: 182 mg/dL — ABNORMAL HIGH (ref 65–99)

## 2016-08-10 LAB — C DIFFICILE QUICK SCREEN W PCR REFLEX
C Diff antigen: POSITIVE — AB
C Diff interpretation: DETECTED
C Diff toxin: POSITIVE — AB

## 2016-08-10 LAB — CBC
HCT: 36.3 % — ABNORMAL LOW (ref 39.0–52.0)
HEMOGLOBIN: 11.7 g/dL — AB (ref 13.0–17.0)
MCH: 30.1 pg (ref 26.0–34.0)
MCHC: 32.2 g/dL (ref 30.0–36.0)
MCV: 93.3 fL (ref 78.0–100.0)
Platelets: 203 10*3/uL (ref 150–400)
RBC: 3.89 MIL/uL — ABNORMAL LOW (ref 4.22–5.81)
RDW: 14.7 % (ref 11.5–15.5)
WBC: 24.5 10*3/uL — ABNORMAL HIGH (ref 4.0–10.5)

## 2016-08-10 LAB — BASIC METABOLIC PANEL
ANION GAP: 4 — AB (ref 5–15)
BUN: 48 mg/dL — AB (ref 6–20)
CALCIUM: 9 mg/dL (ref 8.9–10.3)
CO2: 20 mmol/L — AB (ref 22–32)
Chloride: 113 mmol/L — ABNORMAL HIGH (ref 101–111)
Creatinine, Ser: 1.67 mg/dL — ABNORMAL HIGH (ref 0.61–1.24)
GFR calc Af Amer: 42 mL/min — ABNORMAL LOW (ref >60)
GFR, EST NON AFRICAN AMERICAN: 37 mL/min — AB (ref >60)
GLUCOSE: 170 mg/dL — AB (ref 65–99)
POTASSIUM: 4 mmol/L (ref 3.5–5.1)
Sodium: 137 mmol/L (ref 135–145)

## 2016-08-10 LAB — MAGNESIUM: MAGNESIUM: 1.3 mg/dL — AB (ref 1.7–2.4)

## 2016-08-10 MED ORDER — METRONIDAZOLE 500 MG PO TABS
500.0000 mg | ORAL_TABLET | Freq: Three times a day (TID) | ORAL | Status: DC
  Administered 2016-08-10 – 2016-08-11 (×5): 500 mg via ORAL
  Filled 2016-08-10 (×5): qty 1

## 2016-08-10 MED ORDER — VANCOMYCIN 50 MG/ML ORAL SOLUTION
500.0000 mg | Freq: Four times a day (QID) | ORAL | Status: DC
  Administered 2016-08-10 – 2016-08-16 (×26): 500 mg via ORAL
  Filled 2016-08-10 (×30): qty 10

## 2016-08-10 MED ORDER — VANCOMYCIN 50 MG/ML ORAL SOLUTION
ORAL | Status: AC
  Filled 2016-08-10: qty 5

## 2016-08-10 MED ORDER — MAGNESIUM SULFATE 2 GM/50ML IV SOLN
2.0000 g | Freq: Once | INTRAVENOUS | Status: AC
  Administered 2016-08-10: 2 g via INTRAVENOUS
  Filled 2016-08-10: qty 50

## 2016-08-10 MED ORDER — INSULIN DETEMIR 100 UNIT/ML ~~LOC~~ SOLN
15.0000 [IU] | Freq: Every day | SUBCUTANEOUS | Status: DC
  Administered 2016-08-10 – 2016-08-15 (×6): 15 [IU] via SUBCUTANEOUS
  Filled 2016-08-10 (×7): qty 0.15

## 2016-08-10 NOTE — Evaluation (Signed)
Physical Therapy Evaluation Patient Details Name: Zachary Mcmahon MRN: [id number] DOB: June 21, 1932 Today's Date: 08/10/2016   History of Present Illness  82 y.o. male with medical history significant of DM, CKD stage II Cr baseline 1.8, HTN who presents complaining of persistent diarrhea for 6 weeks. He has been evaluated by PCP and UC. He has stool test done which were 'negative'. He took antibiotics last oct for infection. Dr Reather Converse obtained record from [place], which showed positive C diff PCR. Patient report 5 Watery Bowel movement per day. Report fatigue. He has not been taking his lasix.    Clinical Impression  Pt received in bed, and was agreeable to PT evaluation.  Pt expressed that he only uses his cane or RW when ambulating on uneven surfaces, otherwise he goes without.  He is independent with dressing and bathing, as well as driving.  He lives with his wife, and they have a caregiver that is there from [place] 5 days per week.  During PT evaluation, he required Mod A for supine<>sit, and Mod A for sit<>stand.  He was able to ambulate 56ft with RW and Min A for gait.  He would likely benefit from SNF, however he expressed that he will be going home, and his caretaker will be able to assist him at home.  Therefore, he is recommended for HHPT, and will need a BSC upon d/c.     Follow Up Recommendations Home health PT;Supervision/Assistance - 24 hour    Equipment Recommendations  3in1 (PT)    Recommendations for Other Services       Precautions / Restrictions Precautions Precautions: Fall Precaution Comments: due to immobility and weakness Restrictions Weight Bearing Restrictions: No      Mobility  Bed Mobility Overal bed mobility: Needs Assistance Bed Mobility: Supine to Sit     Supine to sit: HOB elevated;Min assist;Mod assist (increased time and use of bed pad to assist pt's hips to the EOB. )        Transfers Overall transfer level: Needs assistance Equipment used:  Rolling walker (2 wheeled) Transfers: Sit to/from Stand Sit to Stand: Mod assist;From elevated surface            Ambulation/Gait Ambulation/Gait assistance: Min assist Ambulation Distance (Feet): 15 Feet Assistive device: Rolling walker (2 wheeled) Gait Pattern/deviations: Step-to pattern;Trunk flexed   Gait velocity interpretation: <1.8 ft/sec, indicative of risk for recurrent falls General Gait Details: Pt with crouched posture and requires cues for knee extension and to step inside the frame of the RW.  Pt demonstrates extremely slow cadence at this time with cues to continue taking steps.     Pt ambulated into the bathroom and had a watery BM.  Pt requires assistance for hygiene due to fatigue and weakness.  Stairs            Wheelchair Mobility    Modified Rankin (Stroke Patients Only)       Balance Overall balance assessment: Needs assistance Sitting-balance support: Bilateral upper extremity supported;Feet supported Sitting balance-Leahy Scale: Fair     Standing balance support: Bilateral upper extremity supported Standing balance-Leahy Scale: Poor                               Pertinent Vitals/Pain Pain Assessment: No/denies pain    Home Living   Living Arrangements: Spouse/significant other (caretaker) Available Help at Discharge: Available 24 hours/day;Personal care attendant (Caregiver is present from 7am - 5pm  5 days per week)   Home Access: Ramped entrance     Home Layout: One level Home Equipment: Walker - 2 wheels;Cane - single point;Electric scooter;Tub bench      Prior Function     Gait / Transfers Assistance Needed: unlevel ground he uses either the cane or the RW, but if it is level, he does not use any DME.   ADL's / Homemaking Assistance Needed: independent with dressing, and bathing.  driving.  However, caretaker runs errands.    Comments: wife has Alzheimers with caretaker at home who can assist pt as needed.        Hand Dominance   Dominant Hand: Right    Extremity/Trunk Assessment   Upper Extremity Assessment Upper Extremity Assessment: Generalized weakness    Lower Extremity Assessment Lower Extremity Assessment: Generalized weakness    Cervical / Trunk Assessment Cervical / Trunk Assessment: Kyphotic (extreme forward head posture. )  Communication   Communication: No difficulties  Cognition Arousal/Alertness: Awake/alert Behavior During Therapy: WFL for tasks assessed/performed Overall Cognitive Status: Within Functional Limits for tasks assessed                      General Comments      Exercises     Assessment/Plan    PT Assessment Patient needs continued PT services  PT Problem List Decreased strength;Decreased activity tolerance;Decreased balance;Decreased mobility;Decreased knowledge of use of DME;Decreased safety awareness;Decreased knowledge of precautions          PT Treatment Interventions DME instruction;Gait training;Functional mobility training;Therapeutic activities;Therapeutic exercise;Balance training;Patient/family education    PT Goals (Current goals can be found in the Care Plan section)  Acute Rehab PT Goals Patient Stated Goal: Pt wants to regain strength to go back home PT Goal Formulation: With patient Time For Goal Achievement: 08/17/16 Potential to Achieve Goals: Fair    Frequency Min 3X/week   Barriers to discharge        Co-evaluation               End of Session Equipment Utilized During Treatment: Gait belt Activity Tolerance: Patient limited by fatigue Patient left: in chair;with call bell/phone within reach Nurse Communication: Mobility status Wells Guiles, RN notified of pt's mobility status, and location.  Mobility sheet hung up in the room. )    Functional Assessment Tool Used: [employer]	[place] AM-PAC "6-clicks"  Functional Limitation: Mobility: Walking and moving around Mobility: Walking and Moving Around  Current Status [phone]): At least 40 percent but less than 60 percent impaired, limited or restricted Mobility: Walking and Moving Around Goal Status [phone]): At least 20 percent but less than 40 percent impaired, limited or restricted    Time: 0981-1914 PT Time Calculation (min) (ACUTE ONLY): 44 min   Charges:   PT Evaluation $PT Eval Low Complexity: 1 Procedure PT Treatments $Gait Training: 8-22 mins $Therapeutic Activity: 8-22 mins   PT G Codes:   PT G-Codes **NOT FOR INPATIENT CLASS** Functional Assessment Tool Used: [employer] "6-clicks"  Functional Limitation: Mobility: Walking and moving around Mobility: Walking and Moving Around Current Status [phone]): At least 40 percent but less than 60 percent impaired, limited or restricted Mobility: Walking and Moving Around Goal Status [phone]): At least 20 percent but less than 40 percent impaired, limited or restricted    Beth Ortha Metts, PT, DPT X: [phone]

## 2016-08-10 NOTE — Progress Notes (Signed)
PROGRESS NOTE    Zachary Mcmahon  PZW:[id number] DOB: May 21, 1932 DOA: 08/09/2016 PCP: Wende Neighbors, MD   Brief Narrative:  Zachary Mcmahon is a 82 y.o. male with medical history significant of DM, CKD stage II Cr baseline 1.8, HTN who presents complaining of persistent diarrhea for 6 weeks. He has been evaluated by PCP and UC. He has stool test done which were 'negative'. He took antibiotics last oct for infection. Dr Reather Converse obtained record from [place], which showed positive C diff PCR. Patient report 5 Watery Bowel movement per day. Report fatigue. He has not been taking his lasix.  ED Course:  WBC 20, lactic acid 0.8, cr at 2,   Patient admitted with C diff colitis.   Assessment & Plan:   Principal Problem:   C. difficile colitis Active Problems:   HTN (hypertension), malignant   DM type 2 (diabetes mellitus, type 2) (HCC)   Acute renal failure (HCC)   CKD (chronic kidney disease) stage 3, GFR 30-59 ml/min   Diarrhea   CHF (congestive heart failure) ([place])  1-C. Diff Colitis; presents with abdominal pain, diarrhea, leukocytosis , prior history of antibiotics use. CT showed pan--colitis. .  Outside facility C diff positive, didn't received tx.  WBC today higher at 24. Will increase dose of vancomycin and add flagyl.  Continue with full liquid diet   2-2-Acute on CKD stage III; Cr at 2. Per record last cr at 1.8.  Hold Cozaar and lasix.  NSL.  Cr trending down. Today 1.6  3-A fib; continue with eliquis and coreg.   4-DM; Resume levemir, lower dose.  SSI.   5-Chronic Diastolic HF; hold lasix in setting of Diarrhea. NSL.   6- 2.4 cm indeterminate left renal lesion; needs follow up.       DVT prophylaxis: On Eliquis.  Code Status: Full code.  Family Communication: care discussed with patient.  Disposition Plan: home when infection improved, resolved.  Consultants:   none   Procedures:   none  Antimicrobials:  Vancomycin Flagyl.   Subjective: He is feeling  better, still with diarrhea, less frequent.   Objective: Vitals:   08/09/16 2035 08/09/16 2250 08/09/16 2320 08/10/16 0653  BP:  (!) 145/47  140/64  Pulse:  71  100  Resp:  20  20  Temp:  (!) 101 F (38.3 C) (!) 100.4 F (38 C) 99.4 F (37.4 C)  TempSrc:  Oral Oral Oral  SpO2: 97% 94%  97%  Weight:    89.7 kg (197 lb 12.8 oz)  Height:        Intake/Output Summary (Last 24 hours) at 08/10/16 1221 Last data filed at 08/10/16 0657  Gross per 24 hour  Intake             1140 ml  Output              350 ml  Net              790 ml   Filed Weights   08/09/16 0926 08/10/16 0653  Weight: 85.3 kg (188 lb) 89.7 kg (197 lb 12.8 oz)    Examination:  General exam: Appears calm and comfortable  Respiratory system: Clear to auscultation. Respiratory effort normal. Cardiovascular system: S1 & S2 heard, RRR. No JVD, murmurs, rubs, gallops or clicks. No pedal edema. Gastrointestinal system: Abdomen is mild distended, soft and nontender. No organomegaly or masses felt. Normal bowel sounds heard. Central nervous system: Alert and oriented. No focal neurological deficits. Extremities:  Symmetric 5 x 5 power. Skin: No rashes, lesions or ulcers Psychiatry: Judgement and insight appear normal. Mood & affect appropriate.     Data Reviewed: I have personally reviewed following labs and imaging studies  CBC:  Recent Labs Lab 08/09/16 1009 08/10/16 0452  WBC 20.5* 24.5*  NEUTROABS 16.5*  --   HGB 11.4* 11.7*  HCT 34.8* 36.3*  MCV 92.8 93.3  PLT 218 027   Basic Metabolic Panel:  Recent Labs Lab 08/09/16 1009 08/10/16 0452  NA 139 137  K 4.0 4.0  CL 112* 113*  CO2 24 20*  GLUCOSE 88 170*  BUN 53* 48*  CREATININE 2.00* 1.67*  CALCIUM 9.5 9.0   GFR: Estimated Creatinine Clearance: 38.1 mL/min (by C-G formula based on SCr of 1.67 mg/dL (H)). Liver Function Tests: No results for input(s): AST, ALT, ALKPHOS, BILITOT, PROT, ALBUMIN in the last 168 hours. No results for  input(s): LIPASE, AMYLASE in the last 168 hours. No results for input(s): AMMONIA in the last 168 hours. Coagulation Profile: No results for input(s): INR, PROTIME in the last 168 hours. Cardiac Enzymes: No results for input(s): CKTOTAL, CKMB, CKMBINDEX, TROPONINI in the last 168 hours. BNP (last 3 results) No results for input(s): PROBNP in the last 8760 hours. HbA1C: No results for input(s): HGBA1C in the last 72 hours. CBG:  Recent Labs Lab 08/09/16 1748 08/09/16 2237 08/10/16 0755 08/10/16 1116  GLUCAP 108* 190* 185* 235*   Lipid Profile: No results for input(s): CHOL, HDL, LDLCALC, TRIG, CHOLHDL, LDLDIRECT in the last 72 hours. Thyroid Function Tests: No results for input(s): TSH, T4TOTAL, FREET4, T3FREE, THYROIDAB in the last 72 hours. Anemia Panel: No results for input(s): VITAMINB12, FOLATE, FERRITIN, TIBC, IRON, RETICCTPCT in the last 72 hours. Sepsis Labs:  Recent Labs Lab 08/09/16 1023  LATICACIDVEN 0.86    Recent Results (from the past 240 hour(s))  C difficile quick scan w PCR reflex     Status: Abnormal   Collection Time: 08/10/16  7:00 AM  Result Value Ref Range Status   C Diff antigen POSITIVE (A) NEGATIVE Final   C Diff toxin POSITIVE (A) NEGATIVE Final    Comment: CRITICAL RESULT CALLED TO, READ BACK BY AND VERIFIED WITH: [place],R. AT 1150 ON 08/10/2016 BY BAUGHAM,M.    C Diff interpretation Toxin producing C. difficile detected.  Final         Radiology Studies: Ct Renal Stone Study  Result Date: 08/09/2016 CLINICAL DATA:  82 year old hypertensive male with diarrhea for the past month. Initial encounter. EXAM: CT ABDOMEN AND PELVIS WITHOUT CONTRAST TECHNIQUE: Multidetector CT imaging of the abdomen and pelvis was performed following the standard protocol without IV contrast. COMPARISON:  None. FINDINGS: Lower chest: No worrisome lung base abnormality. Cardiomegaly. Coronary artery calcifications. Hepatobiliary: Taking into account  limitation by non contrast imaging, no worrisome hepatic lesion. Gallstones. Pancreas: Taking into account limitation by non contrast imaging, no pancreatic inflammation or mass. Spleen: Taking into account limitation by non contrast imaging, no enlargement or mass. Adrenals/Urinary Tract: No renal or ureteral obstructing stone. Bilateral renal cysts are noted. On the left, 2.4 cm indeterminate lesion. On the right, inferior pole 6 mm and 4 mm hyperdense lesion possibly hemorrhagic cysts. Indeterminate left renal lesion can be assessed if the patient's acute episode has cleared. Mild adrenal hyperplasia. Stomach/Bowel: Diffuse inflammation of the colon consistent with pancolitis with inflammation of surrounding fat planes and small amount of free fluid extending superior to the liver and spleen. Currently no well-defined drainable abscess  or free intraperitoneal air. Inflammatory process is not centered around the appendix. Under distended stomach without abnormality noted. Vascular/Lymphatic: Calcification of the aorta, common iliac arteries (with moderate narrowing), femoral arteries and aortic branch vessels. No adenopathy Reproductive: Enlarged prostate gland impresses upon the bladder base. Clinical laboratory correlation recommended. Other: Large left inguinal fat and vessel containing hernia with small amount of fluid along the inferior aspect which extends into the scrotum. Musculoskeletal: Degenerative changes lumbar spine most notable L5-S1. Remote Schmorl's node deformity superior endplate L1. Prior right hip surgery. IMPRESSION: Significant diffuse pancolitis. This may be infectious in origin. Result of ischemia felt to be a secondary less likely consideration. Small amount of free fluid extending superior to the liver and spleen. Currently no well-defined drainable abscess or free intraperitoneal air. Large left inguinal fat and vessel containing hernia with small amount of fluid along the inferior  aspect which extends into the scrotum. 2.4 cm indeterminate left renal lesion. This can be assessed on follow-up after the patient's colitis has been treated. Question 4 mm and 6 mm hyperdense cyst inferior aspect right kidney. Enlarged prostate gland impresses upon the bladder base. Clinical and laboratory correlation recommended. Gallstones. Cardiomegaly with coronary artery calcification. Calcification of the aorta, common iliac arteries (with moderate narrowing), femoral arteries and aortic branch vessels. These results were called by telephone at the time of interpretation on 08/09/2016 at 1:39 pm to Dr. Elnora Morrison , who verbally acknowledged these results. Electronically Signed   By: Genia Del M.D.   On: 08/09/2016 13:50        Scheduled Meds: . apixaban  2.5 mg Oral BID  . aspirin EC  81 mg Oral q morning - 10a  . atorvastatin  10 mg Oral q1800  . carvedilol  12.5 mg Oral BID  . gabapentin  100 mg Oral TID  . insulin aspart  0-9 Units Subcutaneous TID WC  . metroNIDAZOLE  500 mg Oral Q8H  . pantoprazole  40 mg Oral Daily  . vancomycin  500 mg Oral Q6H   Continuous Infusions:   LOS: 1 day    Time spent: 35 minutes.     Elmarie Shiley, MD [facility] Pager [phone]  If 7PM-7AM, please contact night-coverage [email or web address] Password [email or web address] 08/10/2016, 12:21 PM

## 2016-08-10 NOTE — Progress Notes (Signed)
Nurse notified attending MD that patient's stool sample positive for c-diff. Patient already in enteric precautions.

## 2016-08-11 ENCOUNTER — Inpatient Hospital Stay (HOSPITAL_COMMUNITY): Payer: Medicare Other

## 2016-08-11 LAB — GLUCOSE, CAPILLARY
GLUCOSE-CAPILLARY: 170 mg/dL — AB (ref 65–99)
GLUCOSE-CAPILLARY: 181 mg/dL — AB (ref 65–99)
Glucose-Capillary: 119 mg/dL — ABNORMAL HIGH (ref 65–99)
Glucose-Capillary: 223 mg/dL — ABNORMAL HIGH (ref 65–99)

## 2016-08-11 LAB — BASIC METABOLIC PANEL
Anion gap: 4 — ABNORMAL LOW (ref 5–15)
BUN: 47 mg/dL — AB (ref 6–20)
CO2: 21 mmol/L — ABNORMAL LOW (ref 22–32)
CREATININE: 1.94 mg/dL — AB (ref 0.61–1.24)
Calcium: 9 mg/dL (ref 8.9–10.3)
Chloride: 110 mmol/L (ref 101–111)
GFR calc Af Amer: 35 mL/min — ABNORMAL LOW (ref >60)
GFR calc non Af Amer: 30 mL/min — ABNORMAL LOW (ref >60)
Glucose, Bld: 120 mg/dL — ABNORMAL HIGH (ref 65–99)
Potassium: 3.6 mmol/L (ref 3.5–5.1)
Sodium: 135 mmol/L (ref 135–145)

## 2016-08-11 LAB — CBC
HCT: 34.7 % — ABNORMAL LOW (ref 39.0–52.0)
HEMOGLOBIN: 11.2 g/dL — AB (ref 13.0–17.0)
MCH: 30 pg (ref 26.0–34.0)
MCHC: 32.3 g/dL (ref 30.0–36.0)
MCV: 93 fL (ref 78.0–100.0)
Platelets: 220 10*3/uL (ref 150–400)
RBC: 3.73 MIL/uL — ABNORMAL LOW (ref 4.22–5.81)
RDW: 14.4 % (ref 11.5–15.5)
WBC: 26.2 10*3/uL — ABNORMAL HIGH (ref 4.0–10.5)

## 2016-08-11 LAB — MAGNESIUM: MAGNESIUM: 1.8 mg/dL (ref 1.7–2.4)

## 2016-08-11 MED ORDER — SODIUM CHLORIDE 0.9 % IV SOLN
INTRAVENOUS | Status: DC
  Administered 2016-08-11: 17:00:00 via INTRAVENOUS

## 2016-08-11 MED ORDER — SODIUM CHLORIDE 0.9% FLUSH
3.0000 mL | Freq: Two times a day (BID) | INTRAVENOUS | Status: DC
  Administered 2016-08-11 – 2016-08-16 (×6): 3 mL via INTRAVENOUS

## 2016-08-11 MED ORDER — SODIUM CHLORIDE 0.9% FLUSH: 3.0000 mL | INTRAVENOUS | Status: DC | PRN

## 2016-08-11 MED ORDER — SODIUM CHLORIDE 0.9 % IV SOLN: 250.0000 mL | INTRAVENOUS | Status: DC | PRN

## 2016-08-11 MED ORDER — METRONIDAZOLE IN NACL 5-0.79 MG/ML-% IV SOLN
500.0000 mg | Freq: Three times a day (TID) | INTRAVENOUS | Status: DC
  Administered 2016-08-11 – 2016-08-16 (×14): 500 mg via INTRAVENOUS
  Filled 2016-08-11 (×12): qty 100

## 2016-08-11 NOTE — Care Management Note (Addendum)
Case Management Note  Patient Details  Name: TAY WHITWELL MRN: [id number] Date of Birth: Aug 23, 1931  Subjective/Objective:   Patient adm from home with C.Diff/colitis. He lives with his wife, has cane and walker PTA. He would be recommended for SNF but is wanting to go home with home health. He is active with [facility]. I have left voicemail with them for verification.  He states they have been taking him to doctor appointments. He has used [facility] in the past and would like to again. He was recommended for 3 in 1 but states he has one.  He also reports that his wife has a Database administrator and she will be available to help him as well.          Action/Plan: Romualdo Bolk of [facility] notified and will obtain orders from chart. Patient aware [facility] has 48  Hours to initiate services.    Later entry: Patient is active with [facility].  Expected Discharge Date:  08/12/16               Expected Discharge Plan:  [place]  In-House Referral:  NA  Discharge planning Services  CM Consult  Post Acute Care Choice:  Home Health Choice offered to:  Patient  DME Arranged:    DME Agency:     HH Arranged:  RN, PT [place] Agency:  [place]  Status of Service:  In process, will continue to follow  If discussed at Long Length of Stay Meetings, dates discussed:    Additional Comments:  Catalea [place], Chauncey Reading, RN 08/11/2016, 9:38 AM

## 2016-08-11 NOTE — Progress Notes (Addendum)
PROGRESS NOTE    Zachary Mcmahon  UEA:[id number] DOB: 1931/11/30 DOA: 08/09/2016 PCP: Wende Neighbors, MD   Brief Narrative:  Zachary Mcmahon is a 82 y.o. male with medical history significant of DM, CKD stage II Cr baseline 1.8, HTN who presents complaining of persistent diarrhea for 6 weeks. He has been evaluated by PCP and UC. He has stool test done which were 'negative'. He took antibiotics last oct for infection. Dr Reather Converse obtained record from [place], which showed positive C diff PCR. Patient report 5 Watery Bowel movement per day. Report fatigue. He has not been taking his lasix.  ED Course:  WBC 20, lactic acid 0.8, cr at 2,   Patient admitted with C diff colitis.   Assessment & Plan:   Principal Problem:   C. difficile colitis Active Problems:   HTN (hypertension), malignant   DM type 2 (diabetes mellitus, type 2) (HCC)   Acute renal failure (HCC)   CKD (chronic kidney disease) stage 3, GFR 30-59 ml/min   Diarrhea   CHF (congestive heart failure) ([place])  1-C. Diff Colitis; presents with abdominal pain, diarrhea, leukocytosis , prior history of antibiotics use. CT showed pan--colitis. .  Outside facility C diff positive, didn't received tx.  WBC increasing.  Continue with full liquid diet  Continue with oral vancomycin 500 mg every 6 hours. Change flagyl to IV.  WBC continue to increase. Discussed with ID , will check KUB if ileus will need to add vancomycin enema. Low threshold to repeat CT abdomen and pelvis.  Check Blood culture.   2-Acute on CKD stage III; Cr at 2. Per record last cr at 1.8.  Hold Cozaar and lasix.  Resume IV fluids.  Cr fluctuates.   3-A fib; continue with eliquis and coreg.   4-DM; Resume levemir, lower dose.  SSI.   5-Chronic Diastolic HF; hold lasix in setting of Diarrhea. Monitor on IV fluids.   6- 2.4 cm indeterminate left renal lesion; needs follow up.       DVT prophylaxis: On Eliquis.  Code Status: Full code.  Family Communication:  care discussed with patient. Will contact care take per patient request  Disposition Plan: home when infection improved, resolved.  Consultants:   none   Procedures:   none  Antimicrobials:  Vancomycin Flagyl.   Subjective: Patient is feeling better. He feels his diarrhea and abdominal pain is better. He is still having 6 BM per day.    Objective: Vitals:   08/10/16 1600 08/10/16 2100 08/10/16 2146 08/11/16 0544  BP:  122/62  (!) 134/51  Pulse:  60  66  Resp:  18  18  Temp: 98.9 F (37.2 C) 98.5 F (36.9 C)  99.1 F (37.3 C)  TempSrc:  Oral  Oral  SpO2:  99% 99% 98%  Weight:    91.9 kg (202 lb 8 oz)  Height:        Intake/Output Summary (Last 24 hours) at 08/11/16 1359 Last data filed at 08/11/16 1256  Gross per 24 hour  Intake              483 ml  Output                0 ml  Net              483 ml   Filed Weights   08/09/16 0926 08/10/16 0653 08/11/16 0544  Weight: 85.3 kg (188 lb) 89.7 kg (197 lb 12.8 oz) 91.9 kg (202 lb 8  oz)    Examination:  General exam: Appears calm and comfortable  Respiratory system: Clear to auscultation. Respiratory effort normal. Cardiovascular system: S1 & S2 heard, RRR. No JVD, murmurs, rubs, gallops or clicks. No pedal edema. Gastrointestinal system: Abdomen is mild distended, soft and nontender. No organomegaly or masses felt. Normal bowel sounds heard. Central nervous system: Alert and oriented. No focal neurological deficits. Extremities: Symmetric 5 x 5 power. Skin: No rashes, lesions or ulcers Psychiatry: Judgement and insight appear normal. Mood & affect appropriate.     Data Reviewed: I have personally reviewed following labs and imaging studies  CBC:  Recent Labs Lab 08/09/16 1009 08/10/16 0452 08/11/16 0542  WBC 20.5* 24.5* 26.2*  NEUTROABS 16.5*  --   --   HGB 11.4* 11.7* 11.2*  HCT 34.8* 36.3* 34.7*  MCV 92.8 93.3 93.0  PLT 218 203 878   Basic Metabolic Panel:  Recent Labs Lab 08/09/16 1009  08/10/16 0452 08/10/16 1515 08/11/16 0542  NA 139 137  --  135  K 4.0 4.0  --  3.6  CL 112* 113*  --  110  CO2 24 20*  --  21*  GLUCOSE 88 170*  --  120*  BUN 53* 48*  --  47*  CREATININE 2.00* 1.67*  --  1.94*  CALCIUM 9.5 9.0  --  9.0  MG  --   --  1.3* 1.8   GFR: Estimated Creatinine Clearance: 33.2 mL/min (by C-G formula based on SCr of 1.94 mg/dL (H)). Liver Function Tests: No results for input(s): AST, ALT, ALKPHOS, BILITOT, PROT, ALBUMIN in the last 168 hours. No results for input(s): LIPASE, AMYLASE in the last 168 hours. No results for input(s): AMMONIA in the last 168 hours. Coagulation Profile: No results for input(s): INR, PROTIME in the last 168 hours. Cardiac Enzymes: No results for input(s): CKTOTAL, CKMB, CKMBINDEX, TROPONINI in the last 168 hours. BNP (last 3 results) No results for input(s): PROBNP in the last 8760 hours. HbA1C: No results for input(s): HGBA1C in the last 72 hours. CBG:  Recent Labs Lab 08/10/16 1116 08/10/16 1633 08/10/16 2143 08/11/16 0805 08/11/16 1142  GLUCAP 235* 255* 182* 119* 170*   Lipid Profile: No results for input(s): CHOL, HDL, LDLCALC, TRIG, CHOLHDL, LDLDIRECT in the last 72 hours. Thyroid Function Tests: No results for input(s): TSH, T4TOTAL, FREET4, T3FREE, THYROIDAB in the last 72 hours. Anemia Panel: No results for input(s): VITAMINB12, FOLATE, FERRITIN, TIBC, IRON, RETICCTPCT in the last 72 hours. Sepsis Labs:  Recent Labs Lab 08/09/16 1023  LATICACIDVEN 0.86    Recent Results (from the past 240 hour(s))  C difficile quick scan w PCR reflex     Status: Abnormal   Collection Time: 08/10/16  7:00 AM  Result Value Ref Range Status   C Diff antigen POSITIVE (A) NEGATIVE Final   C Diff toxin POSITIVE (A) NEGATIVE Final    Comment: CRITICAL RESULT CALLED TO, READ BACK BY AND VERIFIED WITH: [place],R. AT 1150 ON 08/10/2016 BY BAUGHAM,M.    C Diff interpretation Toxin producing C. difficile detected.  Final           Radiology Studies: No results found.      Scheduled Meds: . apixaban  2.5 mg Oral BID  . aspirin EC  81 mg Oral q morning - 10a  . atorvastatin  10 mg Oral q1800  . carvedilol  12.5 mg Oral BID  . gabapentin  100 mg Oral TID  . insulin aspart  0-9 Units Subcutaneous  TID WC  . insulin detemir  15 Units Subcutaneous QHS  . metroNIDAZOLE  500 mg Oral Q8H  . pantoprazole  40 mg Oral Daily  . sodium chloride flush  3 mL Intravenous Q12H  . vancomycin  500 mg Oral Q6H   Continuous Infusions: . sodium chloride       LOS: 2 days    Time spent: 35 minutes.     Elmarie Shiley, MD [facility] Pager [phone]  If 7PM-7AM, please contact night-coverage [email or web address] Password [email or web address] 08/11/2016, 1:59 PM

## 2016-08-12 LAB — GLUCOSE, CAPILLARY
GLUCOSE-CAPILLARY: 157 mg/dL — AB (ref 65–99)
GLUCOSE-CAPILLARY: 166 mg/dL — AB (ref 65–99)
Glucose-Capillary: 137 mg/dL — ABNORMAL HIGH (ref 65–99)
Glucose-Capillary: 122 mg/dL — ABNORMAL HIGH (ref 65–99)

## 2016-08-12 LAB — CBC
HCT: 32.2 % — ABNORMAL LOW (ref 39.0–52.0)
HEMOGLOBIN: 10.2 g/dL — AB (ref 13.0–17.0)
MCH: 29.8 pg (ref 26.0–34.0)
MCHC: 31.7 g/dL (ref 30.0–36.0)
MCV: 94.2 fL (ref 78.0–100.0)
Platelets: 209 10*3/uL (ref 150–400)
RBC: 3.42 MIL/uL — ABNORMAL LOW (ref 4.22–5.81)
RDW: 14.2 % (ref 11.5–15.5)
WBC: 22.2 10*3/uL — ABNORMAL HIGH (ref 4.0–10.5)

## 2016-08-12 LAB — BASIC METABOLIC PANEL
Anion gap: 3 — ABNORMAL LOW (ref 5–15)
BUN: 46 mg/dL — AB (ref 6–20)
CHLORIDE: 110 mmol/L (ref 101–111)
CO2: 21 mmol/L — AB (ref 22–32)
CREATININE: 1.99 mg/dL — AB (ref 0.61–1.24)
Calcium: 8.7 mg/dL — ABNORMAL LOW (ref 8.9–10.3)
GFR calc Af Amer: 34 mL/min — ABNORMAL LOW (ref >60)
GFR calc non Af Amer: 30 mL/min — ABNORMAL LOW (ref >60)
Glucose, Bld: 139 mg/dL — ABNORMAL HIGH (ref 65–99)
Potassium: 3.9 mmol/L (ref 3.5–5.1)
SODIUM: 134 mmol/L — AB (ref 135–145)

## 2016-08-12 LAB — MAGNESIUM: MAGNESIUM: 1.7 mg/dL (ref 1.7–2.4)

## 2016-08-12 NOTE — Progress Notes (Signed)
PROGRESS NOTE    Zachary Mcmahon  JWJ:[id number] DOB: 01/29/1932 DOA: 08/09/2016 PCP: Wende Neighbors, MD   Brief Narrative:  Zachary Mcmahon is a 82 y.o. male with medical history significant of DM, CKD stage II Cr baseline 1.8, HTN who presents complaining of persistent diarrhea for 6 weeks. He has been evaluated by PCP and UC. He has stool test done which were 'negative'. He took antibiotics last oct for infection. Dr Reather Converse obtained record from [place], which showed positive C diff PCR. Patient report 5 Watery Bowel movement per day. Report fatigue. He has not been taking his lasix.  ED Course:  WBC 20, lactic acid 0.8, cr at 2,   Patient admitted with C diff colitis.   Assessment & Plan:   Principal Problem:   C. difficile colitis Active Problems:   HTN (hypertension), malignant   DM type 2 (diabetes mellitus, type 2) (HCC)   Acute renal failure (HCC)   CKD (chronic kidney disease) stage 3, GFR 30-59 ml/min   Diarrhea   CHF (congestive heart failure) ([place])  1-C. Diff Colitis; presents with abdominal pain, diarrhea, leukocytosis , prior history of antibiotics use. CT showed pan--colitis. .  Outside facility C diff positive, didn't received tx.  WBC trending down on current regimen. We'll plan on continuing current antibiotic regimen. Patient reports improvement in condition and decrease in diarrhea.  2-Acute on CKD stage III; Cr at 2. Per record last cr at 1.8.  Hold Cozaar and lasix.  Resume IV fluids.  Cr fluctuates.   3-A fib; continue with eliquis and coreg.   4-DM; continue levemir, SSI.   5-Chronic Diastolic HF; hold lasix in setting of Diarrhea. Monitor on IV fluids.   6- 2.4 cm indeterminate left renal lesion; needs follow up.    DVT prophylaxis: On Eliquis.  Code Status: Full code.  Family Communication: care discussed with patient.  Disposition Plan: home when infection improved or resolved Consultants:   none   Procedures:   none  Antimicrobials:    Vancomycin Flagyl.   Subjective: Patient is feeling better. No new complaints reported.   Objective: Vitals:   08/11/16 1515 08/11/16 1919 08/11/16 2215 08/12/16 0400  BP: (!) 152/93  (!) 148/48 (!) 112/56  Pulse: (!) 59  69 61  Resp: 18  17 16   Temp: 99 F (37.2 C)  98.9 F (37.2 C) 98.5 F (36.9 C)  TempSrc: Axillary  Oral Oral  SpO2: 99% 98% 96% 95%  Weight:    92.2 kg (203 lb 3.2 oz)  Height:        Intake/Output Summary (Last 24 hours) at 08/12/16 1343 Last data filed at 08/12/16 1300  Gross per 24 hour  Intake           495.83 ml  Output                0 ml  Net           495.83 ml   Filed Weights   08/10/16 0653 08/11/16 0544 08/12/16 0400  Weight: 89.7 kg (197 lb 12.8 oz) 91.9 kg (202 lb 8 oz) 92.2 kg (203 lb 3.2 oz)    Examination:  General exam: Appears calm and comfortable  Respiratory system: Clear to auscultation. Respiratory effort normal. Cardiovascular system: S1 & S2 heard, RRR. No JVD, murmurs, rubs, gallops or clicks. No pedal edema. Gastrointestinal system: Abdomen is mild distended, soft and nontender. No organomegaly or masses felt. Normal bowel sounds heard. Central nervous system: Alert  and oriented. No focal neurological deficits. Extremities: Symmetric 5 x 5 power. Skin: No rashes, lesions or ulcers Psychiatry: Judgement and insight appear normal. Mood & affect appropriate.   Data Reviewed: I have personally reviewed following labs and imaging studies  CBC:  Recent Labs Lab 08/09/16 1009 08/10/16 0452 08/11/16 0542 08/12/16 0444  WBC 20.5* 24.5* 26.2* 22.2*  NEUTROABS 16.5*  --   --   --   HGB 11.4* 11.7* 11.2* 10.2*  HCT 34.8* 36.3* 34.7* 32.2*  MCV 92.8 93.3 93.0 94.2  PLT 218 203 220 998   Basic Metabolic Panel:  Recent Labs Lab 08/09/16 1009 08/10/16 0452 08/10/16 1515 08/11/16 0542 08/12/16 0444  NA 139 137  --  135 134*  K 4.0 4.0  --  3.6 3.9  CL 112* 113*  --  110 110  CO2 24 20*  --  21* 21*  GLUCOSE 88  170*  --  120* 139*  BUN 53* 48*  --  47* 46*  CREATININE 2.00* 1.67*  --  1.94* 1.99*  CALCIUM 9.5 9.0  --  9.0 8.7*  MG  --   --  1.3* 1.8 1.7   GFR: Estimated Creatinine Clearance: 32.4 mL/min (by C-G formula based on SCr of 1.99 mg/dL (H)). Liver Function Tests: No results for input(s): AST, ALT, ALKPHOS, BILITOT, PROT, ALBUMIN in the last 168 hours. No results for input(s): LIPASE, AMYLASE in the last 168 hours. No results for input(s): AMMONIA in the last 168 hours. Coagulation Profile: No results for input(s): INR, PROTIME in the last 168 hours. Cardiac Enzymes: No results for input(s): CKTOTAL, CKMB, CKMBINDEX, TROPONINI in the last 168 hours. BNP (last 3 results) No results for input(s): PROBNP in the last 8760 hours. HbA1C: No results for input(s): HGBA1C in the last 72 hours. CBG:  Recent Labs Lab 08/11/16 1142 08/11/16 1617 08/11/16 2002 08/12/16 0730 08/12/16 1123  GLUCAP 170* 223* 181* 137* 157*   Lipid Profile: No results for input(s): CHOL, HDL, LDLCALC, TRIG, CHOLHDL, LDLDIRECT in the last 72 hours. Thyroid Function Tests: No results for input(s): TSH, T4TOTAL, FREET4, T3FREE, THYROIDAB in the last 72 hours. Anemia Panel: No results for input(s): VITAMINB12, FOLATE, FERRITIN, TIBC, IRON, RETICCTPCT in the last 72 hours. Sepsis Labs:  Recent Labs Lab 08/09/16 1023  LATICACIDVEN 0.86    Recent Results (from the past 240 hour(s))  C difficile quick scan w PCR reflex     Status: Abnormal   Collection Time: 08/10/16  7:00 AM  Result Value Ref Range Status   C Diff antigen POSITIVE (A) NEGATIVE Final   C Diff toxin POSITIVE (A) NEGATIVE Final    Comment: CRITICAL RESULT CALLED TO, READ BACK BY AND VERIFIED WITH: [place],R. AT 1150 ON 08/10/2016 BY BAUGHAM,M.    C Diff interpretation Toxin producing C. difficile detected.  Final  Culture, blood (routine x 2)     Status: None (Preliminary result)   Collection Time: 08/11/16  2:28 PM  Result Value  Ref Range Status   Specimen Description RIGHT ANTECUBITAL  Final   Special Requests BOTTLES DRAWN AEROBIC AND ANAEROBIC 6CC  Final   Culture NO GROWTH < 24 HOURS  Final   Report Status PENDING  Incomplete  Culture, blood (routine x 2)     Status: None (Preliminary result)   Collection Time: 08/11/16  2:34 PM  Result Value Ref Range Status   Specimen Description LEFT ANTECUBITAL  Final   Special Requests BOTTLES DRAWN AEROBIC AND ANAEROBIC [place]  Final  Culture NO GROWTH < 24 HOURS  Final   Report Status PENDING  Incomplete     Radiology Studies: Dg Abd 1 View  Result Date: 08/11/2016 CLINICAL DATA:  C. Difficile colitis. EXAM: ABDOMEN - 1 VIEW COMPARISON:  CT 08/09/2016 FINDINGS: Thickened colonic haustra involving the hepatic flexure and visualized descending colon, consistent with stated history of colitis. Portions of both flanks excluded from the field of view. No evidence of pneumatosis or free air. No small bowel dilatation to suggest ileus or obstruction. Probable air-filled stomach in the left mid abdomen. IMPRESSION: Thickened colonic haustra in the right and left colon consistent with stated history of colitis. Radiographic appearance is similar to CT 2 days prior. No small bowel dilatation to suggest ileus or obstruction. Electronically Signed   By: Jeb Levering M.D.   On: 08/11/2016 14:56    Scheduled Meds: . apixaban  2.5 mg Oral BID  . aspirin EC  81 mg Oral q morning - 10a  . atorvastatin  10 mg Oral q1800  . carvedilol  12.5 mg Oral BID  . gabapentin  100 mg Oral TID  . insulin aspart  0-9 Units Subcutaneous TID WC  . insulin detemir  15 Units Subcutaneous QHS  . metronidazole  500 mg Intravenous Q8H  . pantoprazole  40 mg Oral Daily  . sodium chloride flush  3 mL Intravenous Q12H  . vancomycin  500 mg Oral Q6H   Continuous Infusions: . sodium chloride 50 mL/hr at 08/11/16 1700     LOS: 3 days    Time spent: 35 minutes.   Velvet Bathe, MD [facility] Pager [phone]  If 7PM-7AM, please contact night-coverage [email or web address] Password [facility] 08/12/2016, 1:43 PM

## 2016-08-12 NOTE — Care Management Important Message (Signed)
Important Message  Patient Details  Name: ANISH VANA MRN: [id number] Date of Birth: 12/12/1931   Medicare Important Message Given:  Yes    Sha Burling, [place], RN 08/12/2016, 12:31 PM

## 2016-08-13 LAB — GLUCOSE, CAPILLARY
GLUCOSE-CAPILLARY: 186 mg/dL — AB (ref 65–99)
Glucose-Capillary: 93 mg/dL (ref 65–99)
Glucose-Capillary: 164 mg/dL — ABNORMAL HIGH (ref 65–99)

## 2016-08-13 NOTE — Progress Notes (Signed)
PROGRESS NOTE    Zachary Mcmahon  IRC:[id number] DOB: 02/24/1932 DOA: 08/09/2016 PCP: Wende Neighbors, MD   Brief Narrative:  Zachary Mcmahon is a 82 y.o. male with medical history significant of DM, CKD stage II Cr baseline 1.8, HTN who presents complaining of persistent diarrhea for 6 weeks. He has been evaluated by PCP and UC. He has stool test done which were 'negative'. He took antibiotics last oct for infection. Dr Reather Converse obtained record from [place], which showed positive C diff PCR. Patient report 5 Watery Bowel movement per day. Report fatigue. He has not been taking his lasix.  ED Course:  WBC 20, lactic acid 0.8, cr at 2,   Patient admitted with C diff colitis.   Assessment & Plan:   Principal Problem:   C. difficile colitis Active Problems:   HTN (hypertension), malignant   DM type 2 (diabetes mellitus, type 2) (HCC)   Acute renal failure (HCC)   CKD (chronic kidney disease) stage 3, GFR 30-59 ml/min   Diarrhea   CHF (congestive heart failure) ([place])  1-C. Diff Colitis; presents with abdominal pain, diarrhea, leukocytosis , prior history of antibiotics use. CT showed pan--colitis. .  Outside facility C diff positive, didn't received tx.  WBC trending down on current regimen. We'll plan on continuing current antibiotic regimen. Patient reports improvement in condition and still reporting diarrhea.  2-Acute on CKD stage III; Cr at 2. Per record last cr at 1.8.  Hold Cozaar and lasix.  Resume IV fluids.  Cr fluctuates.   3-A fib; continue with eliquis and coreg.   4-DM; continue levemir, SSI.   5-Chronic Diastolic HF; hold lasix in setting of Diarrhea. Monitor on IV fluids.   6- 2.4 cm indeterminate left renal lesion; needs follow up.    DVT prophylaxis: On Eliquis.  Code Status: Full code.  Family Communication: care discussed with patient.  Disposition Plan: home when infection improved or resolved, Most likely in the next 1-3 days Consultants:    none   Procedures:   none  Antimicrobials:  Vancomycin Flagyl.   Subjective: Patient is feeling better. Still reported diarrhea   Objective: Vitals:   08/12/16 2204 08/13/16 0100 08/13/16 0639 08/13/16 0700  BP: (!) 136/59  138/66   Pulse: 65  61   Resp: 20  13   Temp: 100.1 F (37.8 C) 98.4 F (36.9 C) 99.1 F (37.3 C)   TempSrc: Oral Oral Oral   SpO2: 95%  96%   Weight:    97.2 kg (214 lb 3.6 oz)  Height:        Intake/Output Summary (Last 24 hours) at 08/13/16 1406 Last data filed at 08/13/16 1300  Gross per 24 hour  Intake             1915 ml  Output              550 ml  Net             1365 ml   Filed Weights   08/11/16 0544 08/12/16 0400 08/13/16 0700  Weight: 91.9 kg (202 lb 8 oz) 92.2 kg (203 lb 3.2 oz) 97.2 kg (214 lb 3.6 oz)    Examination:  General exam: Appears calm and comfortable  Respiratory system: Clear to auscultation. Respiratory effort normal. Cardiovascular system: S1 & S2 heard, RRR. No JVD, murmurs, rubs, gallops or clicks. No pedal edema. Gastrointestinal system: Abdomen is mild distended, soft and nontender. No organomegaly or masses felt. Normal bowel sounds heard.  Central nervous system: Alert and oriented. No focal neurological deficits. Extremities: Symmetric 5 x 5 power. Skin: No rashes, lesions or ulcers Psychiatry: Judgement and insight appear normal. Mood & affect appropriate.   Data Reviewed: I have personally reviewed following labs and imaging studies  CBC:  Recent Labs Lab 08/09/16 1009 08/10/16 0452 08/11/16 0542 08/12/16 0444  WBC 20.5* 24.5* 26.2* 22.2*  NEUTROABS 16.5*  --   --   --   HGB 11.4* 11.7* 11.2* 10.2*  HCT 34.8* 36.3* 34.7* 32.2*  MCV 92.8 93.3 93.0 94.2  PLT 218 203 220 960   Basic Metabolic Panel:  Recent Labs Lab 08/09/16 1009 08/10/16 0452 08/10/16 1515 08/11/16 0542 08/12/16 0444  NA 139 137  --  135 134*  K 4.0 4.0  --  3.6 3.9  CL 112* 113*  --  110 110  CO2 24 20*  --   21* 21*  GLUCOSE 88 170*  --  120* 139*  BUN 53* 48*  --  47* 46*  CREATININE 2.00* 1.67*  --  1.94* 1.99*  CALCIUM 9.5 9.0  --  9.0 8.7*  MG  --   --  1.3* 1.8 1.7   GFR: Estimated Creatinine Clearance: 33.2 mL/min (by C-G formula based on SCr of 1.99 mg/dL (H)). Liver Function Tests: No results for input(s): AST, ALT, ALKPHOS, BILITOT, PROT, ALBUMIN in the last 168 hours. No results for input(s): LIPASE, AMYLASE in the last 168 hours. No results for input(s): AMMONIA in the last 168 hours. Coagulation Profile: No results for input(s): INR, PROTIME in the last 168 hours. Cardiac Enzymes: No results for input(s): CKTOTAL, CKMB, CKMBINDEX, TROPONINI in the last 168 hours. BNP (last 3 results) No results for input(s): PROBNP in the last 8760 hours. HbA1C: No results for input(s): HGBA1C in the last 72 hours. CBG:  Recent Labs Lab 08/12/16 1123 08/12/16 1654 08/12/16 2200 08/13/16 0752 08/13/16 1133  GLUCAP 157* 166* 122* 93 164*   Lipid Profile: No results for input(s): CHOL, HDL, LDLCALC, TRIG, CHOLHDL, LDLDIRECT in the last 72 hours. Thyroid Function Tests: No results for input(s): TSH, T4TOTAL, FREET4, T3FREE, THYROIDAB in the last 72 hours. Anemia Panel: No results for input(s): VITAMINB12, FOLATE, FERRITIN, TIBC, IRON, RETICCTPCT in the last 72 hours. Sepsis Labs:  Recent Labs Lab 08/09/16 1023  LATICACIDVEN 0.86    Recent Results (from the past 240 hour(s))  C difficile quick scan w PCR reflex     Status: Abnormal   Collection Time: 08/10/16  7:00 AM  Result Value Ref Range Status   C Diff antigen POSITIVE (A) NEGATIVE Final   C Diff toxin POSITIVE (A) NEGATIVE Final    Comment: CRITICAL RESULT CALLED TO, READ BACK BY AND VERIFIED WITH: [place],R. AT 1150 ON 08/10/2016 BY BAUGHAM,M.    C Diff interpretation Toxin producing C. difficile detected.  Final  Culture, blood (routine x 2)     Status: None (Preliminary result)   Collection Time: 08/11/16  2:28  PM  Result Value Ref Range Status   Specimen Description RIGHT ANTECUBITAL  Final   Special Requests BOTTLES DRAWN AEROBIC AND ANAEROBIC 6CC  Final   Culture NO GROWTH 2 DAYS  Final   Report Status PENDING  Incomplete  Culture, blood (routine x 2)     Status: None (Preliminary result)   Collection Time: 08/11/16  2:34 PM  Result Value Ref Range Status   Specimen Description LEFT ANTECUBITAL  Final   Special Requests BOTTLES DRAWN AEROBIC AND ANAEROBIC  Ray  Final   Culture NO GROWTH 2 DAYS  Final   Report Status PENDING  Incomplete     Radiology Studies: Dg Abd 1 View  Result Date: 08/11/2016 CLINICAL DATA:  C. Difficile colitis. EXAM: ABDOMEN - 1 VIEW COMPARISON:  CT 08/09/2016 FINDINGS: Thickened colonic haustra involving the hepatic flexure and visualized descending colon, consistent with stated history of colitis. Portions of both flanks excluded from the field of view. No evidence of pneumatosis or free air. No small bowel dilatation to suggest ileus or obstruction. Probable air-filled stomach in the left mid abdomen. IMPRESSION: Thickened colonic haustra in the right and left colon consistent with stated history of colitis. Radiographic appearance is similar to CT 2 days prior. No small bowel dilatation to suggest ileus or obstruction. Electronically Signed   By: Jeb Levering M.D.   On: 08/11/2016 14:56    Scheduled Meds: . apixaban  2.5 mg Oral BID  . aspirin EC  81 mg Oral q morning - 10a  . atorvastatin  10 mg Oral q1800  . carvedilol  12.5 mg Oral BID  . gabapentin  100 mg Oral TID  . insulin aspart  0-9 Units Subcutaneous TID WC  . insulin detemir  15 Units Subcutaneous QHS  . metronidazole  500 mg Intravenous Q8H  . pantoprazole  40 mg Oral Daily  . sodium chloride flush  3 mL Intravenous Q12H  . vancomycin  500 mg Oral Q6H   Continuous Infusions: . sodium chloride 50 mL/hr at 08/11/16 1700     LOS: 4 days    Time spent: 35 minutes.   Velvet Bathe,  MD [facility] Pager [phone]  If 7PM-7AM, please contact night-coverage [email or web address] Password [email or web address] 08/13/2016, 2:06 PM

## 2016-08-14 LAB — CBC
HEMATOCRIT: 33.3 % — AB (ref 39.0–52.0)
Hemoglobin: 11 g/dL — ABNORMAL LOW (ref 13.0–17.0)
MCH: 30.6 pg (ref 26.0–34.0)
MCHC: 33 g/dL (ref 30.0–36.0)
MCV: 92.8 fL (ref 78.0–100.0)
Platelets: 240 10*3/uL (ref 150–400)
RBC: 3.59 MIL/uL — AB (ref 4.22–5.81)
RDW: 14.5 % (ref 11.5–15.5)
WBC: 15.6 10*3/uL — AB (ref 4.0–10.5)

## 2016-08-14 LAB — GLUCOSE, CAPILLARY
GLUCOSE-CAPILLARY: 142 mg/dL — AB (ref 65–99)
Glucose-Capillary: 112 mg/dL — ABNORMAL HIGH (ref 65–99)
Glucose-Capillary: 206 mg/dL — ABNORMAL HIGH (ref 65–99)
Glucose-Capillary: 183 mg/dL — ABNORMAL HIGH (ref 65–99)

## 2016-08-14 MED ORDER — DIPHENHYDRAMINE HCL 25 MG PO CAPS
25.0000 mg | ORAL_CAPSULE | Freq: Three times a day (TID) | ORAL | Status: DC | PRN
Start: 2016-08-14 — End: 2016-08-16
  Administered 2016-08-14: 25 mg via ORAL
  Filled 2016-08-14: qty 1

## 2016-08-14 NOTE — Progress Notes (Signed)
Rehab Services regarding PT evaluation  Patient Details Name: Zachary Mcmahon MRN: [id number] DOB: 1932/08/10   Cancelled treatment:        PM nursing asked SLP if PT received consult placed yesterday and SLP reviewed chart and spoke with Pt. PT eval was ordered 08/10/2016 and completed same date with recommendation for [facility] PT due to Pt's desire to go home (vs SNF). Please see attached PT clinical impression from 08/10/2016. SLP spoke with Pt this AM and he voiced his desire to return home with [facility] PT (vs SNF), but stated that he had not gotten up out of bed lately. PT recommendations for mobility are placed on board in room and recommend min A with RW. Will alert RN to mobilize Pt this AM to see if there has been a change in status that warrants another PT evaluation.  (08/10/2016) Clinical Impression Pt received in bed, and was agreeable to PT evaluation.  Pt expressed that he only uses his cane or RW when ambulating on uneven surfaces, otherwise he goes without.  He is independent with dressing and bathing, as well as driving.  He lives with his wife, and they have a caregiver that is there from [place] 5 days per week.  During PT evaluation, he required Mod A for supine<>sit, and Mod A for sit<>stand.  He was able to ambulate 78ft with RW and Min A for gait.  He would likely benefit from SNF, however he expressed that he will be going home, and his caretaker will be able to assist him at home.  Therefore, he is recommended for HHPT, and will need a BSC upon d/c.     Follow Up Recommendations Home health PT;Supervision/Assistance - 24 hour    (PT not on floor at this time) Thank you,  Genene Churn, [place]  [place] 08/14/2016, 7:33 AM

## 2016-08-14 NOTE — Progress Notes (Signed)
PROGRESS NOTE    ANTINO MAYABB  TDV:[id number] DOB: 07/08/1932 DOA: 08/09/2016 PCP: Wende Neighbors, MD   Brief Narrative:  Zachary Mcmahon is a 82 y.o. male with medical history significant of DM, CKD stage II Cr baseline 1.8, HTN who presents complaining of persistent diarrhea for 6 weeks. He has been evaluated by PCP and UC. He has stool test done which were 'negative'. He took antibiotics last oct for infection. Dr Reather Converse obtained record from [place], which showed positive C diff PCR. Patient report 5 Watery Bowel movement per day. Report fatigue. He has not been taking his lasix.  ED Course:  WBC 20, lactic acid 0.8, cr at 2,   Patient admitted with C diff colitis.   Assessment & Plan:   Principal Problem:   C. difficile colitis Active Problems:   HTN (hypertension), malignant   DM type 2 (diabetes mellitus, type 2) (HCC)   Acute renal failure (HCC)   CKD (chronic kidney disease) stage 3, GFR 30-59 ml/min   Diarrhea   CHF (congestive heart failure) ([place])  1-C. Diff Colitis; presents with abdominal pain, diarrhea, leukocytosis , prior history of antibiotics use. CT showed pan--colitis.   Outside facility C diff positive, didn't received tx.  WBC trending down on current regimen. We'll plan on continuing current antibiotic regimen. Patient reports improvement in condition and still reporting diarrhea. - cbc next am.  2-Acute on CKD stage III; Cr at 2. Per record last cr at 1.8.  Hold Cozaar and lasix.  Resume IV fluids.  Cr fluctuates.  - BMP next am ordered.  3-A fib; continue with eliquis and coreg.   4-DM; continue levemir, SSI.   5-Chronic Diastolic HF; hold lasix in setting of Diarrhea. Monitor on IV fluids.   6- 2.4 cm indeterminate left renal lesion; needs follow up.    DVT prophylaxis: On Eliquis.  Code Status: Full code.  Family Communication: care discussed with patient.  Disposition Plan: May consider d/c next am with amelioration of diarrhea and improvement  in wbc levels. Consultants:   none   Procedures:   none  Antimicrobials:  Vancomycin Flagyl.   Subjective: No new complaints.  Still reported diarrhea   Objective: Vitals:   08/13/16 1529 08/13/16 2103 08/14/16 0645 08/14/16 1345  BP: (!) 135/58 (!) 142/60 (!) 149/61 (!) 148/61  Pulse: 62 60 (!) 55 (!) 57  Resp: 18 15 19 18   Temp:  99.5 F (37.5 C) 98.1 F (36.7 C) 98.1 F (36.7 C)  TempSrc:  Oral Oral   SpO2: 97% 97% 98% 100%  Weight:   94.3 kg (207 lb 12.8 oz)   Height:        Intake/Output Summary (Last 24 hours) at 08/14/16 1557 Last data filed at 08/14/16 1300  Gross per 24 hour  Intake              480 ml  Output              650 ml  Net             -170 ml   Filed Weights   08/12/16 0400 08/13/16 0700 08/14/16 0645  Weight: 92.2 kg (203 lb 3.2 oz) 97.2 kg (214 lb 3.6 oz) 94.3 kg (207 lb 12.8 oz)    Examination:  General exam: Appears calm and comfortable  Respiratory system: Clear to auscultation. Respiratory effort normal. Cardiovascular system: S1 & S2 heard, RRR. No JVD, murmurs, rubs, gallops or clicks. No pedal edema. Gastrointestinal system:  Abdomen is mild distended, soft and nontender. No organomegaly or masses felt. Normal bowel sounds heard. Central nervous system: Alert and oriented. No focal neurological deficits. Extremities: Symmetric 5 x 5 power. Skin: No rashes, lesions or ulcers Psychiatry: Judgement and insight appear normal. Mood & affect appropriate.   Data Reviewed: I have personally reviewed following labs and imaging studies  CBC:  Recent Labs Lab 08/09/16 1009 08/10/16 0452 08/11/16 0542 08/12/16 0444 08/14/16 0614  WBC 20.5* 24.5* 26.2* 22.2* 15.6*  NEUTROABS 16.5*  --   --   --   --   HGB 11.4* 11.7* 11.2* 10.2* 11.0*  HCT 34.8* 36.3* 34.7* 32.2* 33.3*  MCV 92.8 93.3 93.0 94.2 92.8  PLT 218 203 220 209 606   Basic Metabolic Panel:  Recent Labs Lab 08/09/16 1009 08/10/16 0452 08/10/16 1515 08/11/16 0542  08/12/16 0444  NA 139 137  --  135 134*  K 4.0 4.0  --  3.6 3.9  CL 112* 113*  --  110 110  CO2 24 20*  --  21* 21*  GLUCOSE 88 170*  --  120* 139*  BUN 53* 48*  --  47* 46*  CREATININE 2.00* 1.67*  --  1.94* 1.99*  CALCIUM 9.5 9.0  --  9.0 8.7*  MG  --   --  1.3* 1.8 1.7   GFR: Estimated Creatinine Clearance: 32.7 mL/min (by C-G formula based on SCr of 1.99 mg/dL (H)). Liver Function Tests: No results for input(s): AST, ALT, ALKPHOS, BILITOT, PROT, ALBUMIN in the last 168 hours. No results for input(s): LIPASE, AMYLASE in the last 168 hours. No results for input(s): AMMONIA in the last 168 hours. Coagulation Profile: No results for input(s): INR, PROTIME in the last 168 hours. Cardiac Enzymes: No results for input(s): CKTOTAL, CKMB, CKMBINDEX, TROPONINI in the last 168 hours. BNP (last 3 results) No results for input(s): PROBNP in the last 8760 hours. HbA1C: No results for input(s): HGBA1C in the last 72 hours. CBG:  Recent Labs Lab 08/13/16 0752 08/13/16 1133 08/13/16 1626 08/14/16 0739 08/14/16 1117  GLUCAP 93 164* 186* 112* 142*   Lipid Profile: No results for input(s): CHOL, HDL, LDLCALC, TRIG, CHOLHDL, LDLDIRECT in the last 72 hours. Thyroid Function Tests: No results for input(s): TSH, T4TOTAL, FREET4, T3FREE, THYROIDAB in the last 72 hours. Anemia Panel: No results for input(s): VITAMINB12, FOLATE, FERRITIN, TIBC, IRON, RETICCTPCT in the last 72 hours. Sepsis Labs:  Recent Labs Lab 08/09/16 1023  LATICACIDVEN 0.86    Recent Results (from the past 240 hour(s))  C difficile quick scan w PCR reflex     Status: Abnormal   Collection Time: 08/10/16  7:00 AM  Result Value Ref Range Status   C Diff antigen POSITIVE (A) NEGATIVE Final   C Diff toxin POSITIVE (A) NEGATIVE Final    Comment: CRITICAL RESULT CALLED TO, READ BACK BY AND VERIFIED WITH: [place],R. AT 1150 ON 08/10/2016 BY BAUGHAM,M.    C Diff interpretation Toxin producing C. difficile  detected.  Final  Culture, blood (routine x 2)     Status: None (Preliminary result)   Collection Time: 08/11/16  2:28 PM  Result Value Ref Range Status   Specimen Description RIGHT ANTECUBITAL  Final   Special Requests BOTTLES DRAWN AEROBIC AND ANAEROBIC 6CC  Final   Culture NO GROWTH 3 DAYS  Final   Report Status PENDING  Incomplete  Culture, blood (routine x 2)     Status: None (Preliminary result)   Collection Time: 08/11/16  2:34 PM  Result Value Ref Range Status   Specimen Description LEFT ANTECUBITAL  Final   Special Requests BOTTLES DRAWN AEROBIC AND ANAEROBIC 6CC  Final   Culture NO GROWTH 3 DAYS  Final   Report Status PENDING  Incomplete     Radiology Studies: No results found.  Scheduled Meds: . apixaban  2.5 mg Oral BID  . aspirin EC  81 mg Oral q morning - 10a  . atorvastatin  10 mg Oral q1800  . carvedilol  12.5 mg Oral BID  . gabapentin  100 mg Oral TID  . insulin aspart  0-9 Units Subcutaneous TID WC  . insulin detemir  15 Units Subcutaneous QHS  . metronidazole  500 mg Intravenous Q8H  . pantoprazole  40 mg Oral Daily  . sodium chloride flush  3 mL Intravenous Q12H  . vancomycin  500 mg Oral Q6H   Continuous Infusions: . sodium chloride 50 mL/hr at 08/11/16 1700     LOS: 5 days    Time spent: 35 minutes.   Velvet Bathe, MD [facility] Pager [phone]  If 7PM-7AM, please contact night-coverage [email or web address] Password [facility] 08/14/2016, 3:57 PM

## 2016-08-14 NOTE — Progress Notes (Signed)
Physical Therapy Treatment Patient Details Name: Zachary Mcmahon MRN: [id number] DOB: 11/09/1931 Today's Date: 08/14/2016    History of Present Illness 82 y.o. male with medical history significant of DM, CKD stage II Cr baseline 1.8, HTN who presents complaining of persistent diarrhea for 6 weeks. He has been evaluated by PCP and UC. He has stool test done which were 'negative'. He took antibiotics last oct for infection. Dr Reather Converse obtained record from [place], which showed positive C diff PCR. Patient report 5 Watery Bowel movement per day. Report fatigue. He has not been taking his lasix.    PT Comments    Pt was received in his bed side chair. He was able to perform sit to stand from both his chair and the bed with MinA to [place] and verbal cues for UE placement. Once standing he was able take several steps to perform his transfer with MinA. Current plan remains appropriate as he is still resistant to going to SNF at discharge.   Follow Up Recommendations  Home health PT;Supervision/Assistance - 24 hour     Equipment Recommendations  3in1 (PT)    Recommendations for Other Services       Precautions / Restrictions Precautions Precautions: None Restrictions Weight Bearing Restrictions: No    Mobility  Bed Mobility         Supine to sit:  (increased time and use of bed pad to assist pt's hips to the EOB. )     General bed mobility comments: Pt sitting up in bedside chair upon arrival  Transfers Overall transfer level: Needs assistance Equipment used: Rolling walker (2 wheeled) Transfers: Sit to/from Stand Sit to Stand: Mod assist;Min assist         General transfer comment: MinA from bed, ModA from chair; verbal cues for hand placement.   Ambulation/Gait Ambulation/Gait assistance: Min assist Ambulation Distance (Feet): 4 Feet Assistive device: Rolling walker (2 wheeled) Gait Pattern/deviations: Step-to pattern;Trunk flexed   Gait velocity interpretation: <1.8  ft/sec, indicative of risk for recurrent falls General Gait Details: Pt with crouched posture and requires cues for knee extension and to step inside the frame of the RW.  Pt demonstrates extremely slow cadence at this time with cues to continue taking steps.     Stairs            Wheelchair Mobility    Modified Rankin (Stroke Patients Only)       Balance Overall balance assessment: Needs assistance Sitting-balance support: Bilateral upper extremity supported;Feet supported Sitting balance-Leahy Scale: Fair     Standing balance support: Bilateral upper extremity supported Standing balance-Leahy Scale: Poor                      Cognition Arousal/Alertness: Awake/alert Behavior During Therapy: WFL for tasks assessed/performed Overall Cognitive Status: Within Functional Limits for tasks assessed                      Exercises      General Comments        Pertinent Vitals/Pain      Home Living                      Prior Function            PT Goals (current goals can now be found in the care plan section) Acute Rehab PT Goals Patient Stated Goal: Pt wants to regain strength to go back home PT Goal Formulation: With  patient Time For Goal Achievement: 08/17/16 Potential to Achieve Goals: Fair Progress towards PT goals: Progressing toward goals    Frequency    Min 3X/week      PT Plan      Co-evaluation             End of Session Equipment Utilized During Treatment: Gait belt Activity Tolerance: Patient limited by fatigue Patient left: in chair;with call bell/phone within reach     Time: 1030-1054 PT Time Calculation (min) (ACUTE ONLY): 24 min  Charges:  $Therapeutic Activity: 8-22 mins                    G Codes:     11:10 AM,09-04-16 Elly Modena PT, DPT [facility]

## 2016-08-15 DIAGNOSIS — N179 Acute kidney failure, unspecified: Secondary | ICD-10-CM

## 2016-08-15 DIAGNOSIS — E11311 Type 2 diabetes mellitus with unspecified diabetic retinopathy with macular edema: Secondary | ICD-10-CM

## 2016-08-15 DIAGNOSIS — Z794 Long term (current) use of insulin: Secondary | ICD-10-CM

## 2016-08-15 LAB — CBC
HEMATOCRIT: 33.4 % — AB (ref 39.0–52.0)
HEMOGLOBIN: 10.9 g/dL — AB (ref 13.0–17.0)
MCH: 30.3 pg (ref 26.0–34.0)
MCHC: 32.6 g/dL (ref 30.0–36.0)
MCV: 92.8 fL (ref 78.0–100.0)
Platelets: 246 10*3/uL (ref 150–400)
RBC: 3.6 MIL/uL — ABNORMAL LOW (ref 4.22–5.81)
RDW: 14.6 % (ref 11.5–15.5)
WBC: 13.3 10*3/uL — ABNORMAL HIGH (ref 4.0–10.5)

## 2016-08-15 LAB — BASIC METABOLIC PANEL
BUN: 30 mg/dL — AB (ref 6–20)
CALCIUM: 8.7 mg/dL — AB (ref 8.9–10.3)
CO2: 21 mmol/L — AB (ref 22–32)
CREATININE: 1.38 mg/dL — AB (ref 0.61–1.24)
Chloride: 112 mmol/L — ABNORMAL HIGH (ref 101–111)
GFR calc Af Amer: 53 mL/min — ABNORMAL LOW (ref >60)
GFR calc non Af Amer: 46 mL/min — ABNORMAL LOW (ref >60)
GLUCOSE: 121 mg/dL — AB (ref 65–99)
Potassium: 4.5 mmol/L (ref 3.5–5.1)
SODIUM: 134 mmol/L — AB (ref 135–145)

## 2016-08-15 LAB — GLUCOSE, CAPILLARY
Glucose-Capillary: 118 mg/dL — ABNORMAL HIGH (ref 65–99)
Glucose-Capillary: 194 mg/dL — ABNORMAL HIGH (ref 65–99)
Glucose-Capillary: 189 mg/dL — ABNORMAL HIGH (ref 65–99)
Glucose-Capillary: 157 mg/dL — ABNORMAL HIGH (ref 65–99)

## 2016-08-15 MED ORDER — HYDRALAZINE HCL 20 MG/ML IJ SOLN
10.0000 mg | Freq: Four times a day (QID) | INTRAMUSCULAR | Status: DC | PRN
  Administered 2016-08-16: 10 mg via INTRAVENOUS
  Filled 2016-08-15: qty 1

## 2016-08-15 MED ORDER — ACETAMINOPHEN 325 MG PO TABS
650.0000 mg | ORAL_TABLET | Freq: Two times a day (BID) | ORAL | Status: DC
  Administered 2016-08-15 – 2016-08-16 (×3): 650 mg via ORAL
  Filled 2016-08-15 (×3): qty 2

## 2016-08-15 MED ORDER — SACCHAROMYCES BOULARDII 250 MG PO CAPS
250.0000 mg | ORAL_CAPSULE | Freq: Two times a day (BID) | ORAL | Status: DC
  Administered 2016-08-15 – 2016-08-16 (×3): 250 mg via ORAL
  Filled 2016-08-15 (×3): qty 1

## 2016-08-15 NOTE — Progress Notes (Signed)
Patient ID: Zachary Mcmahon, male   DOB: [birth year], 82 y.o.   MRN: [id number]                                                                PROGRESS NOTE                                                                                                                                                                                                             Patient Demographics:    Zachary Mcmahon, is a 82 y.o. male, DOB - 11/30/1931, WUJ:[id number]  Admit date - 08/09/2016   Admitting Physician Elmarie Shiley, MD  Outpatient Primary MD for the patient is Wende Neighbors, MD  LOS - 6  Outpatient Specialists:   Chief Complaint  Patient presents with  . Diarrhea       Brief Narrative  82 y.o.malewith medical history significant of DM, CKD stage II Cr baseline 1.8, HTN who presents complaining of persistent diarrhea for 6 weeks. He has been evaluated by PCP and UC. He has stool test done which were 'negative'. He took antibiotics last oct for infection. Dr Reather Converse obtained record from [facility], which showed positive C diff PCR. Patient report 5 Watery Bowel movement per day. Report fatigue. He has not been taking his lasix.  ED Course:WBC 20, lactic acid 0.8, cr at 2,    Subjective:    [name] today has,diarrhea 2x yesterday. appears to be improving slowly.  Denies fever, n/v, abd pain, constipation, brbpr, black stool.  Also denies cp, palp, sob.     Assessment  & Plan :    Principal Problem:   C. difficile colitis Active Problems:   HTN (hypertension), malignant   DM type 2 (diabetes mellitus, type 2) (HCC)   Acute renal failure (HCC)   CKD (chronic kidney disease) stage 3, GFR 30-59 ml/min   Diarrhea   CHF (congestive heart failure) (HCC)   1. C. Diff colitis Cont vanco oral D/c flagyl Add probiotic  2. Diarrhea likely cause of metabolic acidosis, and bun elevation  Improving  3. ARF  Resolving Check cmp in am  4. Dm2 Cont ssi, levemir  5. Chronic diastolic  chf Lasix on hold in setting of diarrhea.   6. 2.4cm indeterminate left renal lesion needs follow up.  7. CAD Cont aspirin, lipitor, carvedilol  8 L knee pain Start tylenol 650mg  po bid Please consider orthopedic referal as outpatient  9. Hx of PE(2016) / DVT (2013) on eliquis  Code Status : FULL CODE  Family Communication  : w patient  Disposition Plan  : home  Barriers For Discharge :   Consults  :  none  Procedures  :   DVT Prophylaxis  :  On eliquis  Lab Results  Component Value Date   PLT 246 08/15/2016    Antibiotics  :   vanco 12/19=> Flaygyl 12/20=> 12/25  Anti-infectives    Start     Dose/Rate Route Frequency Ordered Stop   08/11/16 1415  metroNIDAZOLE (FLAGYL) IVPB 500 mg     500 mg 100 mL/hr over 60 Minutes Intravenous Every 8 hours 08/11/16 1404     08/10/16 0745  vancomycin (VANCOCIN) 50 mg/mL oral solution 500 mg     500 mg Oral Every 6 hours 08/10/16 0736 08/24/16 0559   08/10/16 0745  metroNIDAZOLE (FLAGYL) tablet 500 mg  Status:  Discontinued     500 mg Oral Every 8 hours 08/10/16 0736 08/11/16 1402   08/09/16 1330  vancomycin (VANCOCIN) 50 mg/mL oral solution 125 mg  Status:  Discontinued     125 mg Oral Every 6 hours 08/09/16 1326 08/10/16 0735        Objective:   Vitals:   08/14/16 0645 08/14/16 1345 08/14/16 2254 08/15/16 0613  BP: (!) 149/61 (!) 148/61 (!) 148/60 (!) 145/53  Pulse: (!) 55 (!) 57 (!) 58 (!) 57  Resp: 19 18 16 18   Temp: 98.1 F (36.7 C) 98.1 F (36.7 C) 98.2 F (36.8 C) 98.2 F (36.8 C)  TempSrc: Oral  Oral Oral  SpO2: 98% 100% 98% 95%  Weight: 94.3 kg (207 lb 12.8 oz)   97.4 kg (214 lb 11.2 oz)  Height:        Wt Readings from Last 3 Encounters:  08/15/16 97.4 kg (214 lb 11.2 oz)  05/19/16 100.7 kg (222 lb)  05/03/16 97.5 kg (215 lb)     Intake/Output Summary (Last 24 hours) at 08/15/16 1016 Last data filed at 08/15/16 0900  Gross per 24 hour  Intake          3629.17 ml  Output             1150 ml   Net          2479.17 ml     Physical Exam  Awake Alert, Oriented X 3, No new F.N deficits, Normal affect .AT,PERRAL Supple Neck,No JVD, No cervical lymphadenopathy appriciated.  Symmetrical Chest wall movement, Good air movement bilaterally, CTAB RRR,No Gallops,Rubs or new Murmurs, No Parasternal Heave +ve B.Sounds, Abd Soft, No tenderness, No organomegaly appriciated, No rebound - guarding or rigidity. No Cyanosis, Clubbing or edema, No new Rash or bruise , Foley in place,  Clear yellow liquid    Data Review:    CBC  Recent Labs Lab 08/09/16 1009 08/10/16 0452 08/11/16 0542 08/12/16 0444 08/14/16 0614 08/15/16 0710  WBC 20.5* 24.5* 26.2* 22.2* 15.6* 13.3*  HGB 11.4* 11.7* 11.2* 10.2* 11.0* 10.9*  HCT 34.8* 36.3* 34.7* 32.2* 33.3* 33.4*  PLT 218 203 220 209 240 246  MCV 92.8 93.3 93.0 94.2 92.8 92.8  MCH 30.4 30.1 30.0 29.8 30.6 30.3  MCHC 32.8 32.2 32.3 31.7 33.0 32.6  RDW 14.8 14.7 14.4 14.2 14.5 14.6  LYMPHSABS 1.4  --   --   --   --   --  MONOABS 1.9*  --   --   --   --   --   EOSABS 0.6  --   --   --   --   --   BASOSABS 0.1  --   --   --   --   --     Chemistries   Recent Labs Lab 08/09/16 1009 08/10/16 0452 08/10/16 1515 08/11/16 0542 08/12/16 0444 08/15/16 0710  NA 139 137  --  135 134* 134*  K 4.0 4.0  --  3.6 3.9 4.5  CL 112* 113*  --  110 110 112*  CO2 24 20*  --  21* 21* 21*  GLUCOSE 88 170*  --  120* 139* 121*  BUN 53* 48*  --  47* 46* 30*  CREATININE 2.00* 1.67*  --  1.94* 1.99* 1.38*  CALCIUM 9.5 9.0  --  9.0 8.7* 8.7*  MG  --   --  1.3* 1.8 1.7  --    ------------------------------------------------------------------------------------------------------------------ No results for input(s): CHOL, HDL, LDLCALC, TRIG, CHOLHDL, LDLDIRECT in the last 72 hours.  Lab Results  Component Value Date   HGBA1C 7.5 (H) 02/07/2015    ------------------------------------------------------------------------------------------------------------------ No results for input(s): TSH, T4TOTAL, T3FREE, THYROIDAB in the last 72 hours.  Invalid input(s): FREET3 ------------------------------------------------------------------------------------------------------------------ No results for input(s): VITAMINB12, FOLATE, FERRITIN, TIBC, IRON, RETICCTPCT in the last 72 hours.  Coagulation profile No results for input(s): INR, PROTIME in the last 168 hours.  No results for input(s): DDIMER in the last 72 hours.  Cardiac Enzymes No results for input(s): CKMB, TROPONINI, MYOGLOBIN in the last 168 hours.  Invalid input(s): CK ------------------------------------------------------------------------------------------------------------------    Component Value Date/Time   BNP 2,598.0 (H) 05/19/2016 0423    Inpatient Medications  Scheduled Meds: . acetaminophen  650 mg Oral BID  . apixaban  2.5 mg Oral BID  . aspirin EC  81 mg Oral q morning - 10a  . atorvastatin  10 mg Oral q1800  . carvedilol  12.5 mg Oral BID  . gabapentin  100 mg Oral TID  . insulin aspart  0-9 Units Subcutaneous TID WC  . insulin detemir  15 Units Subcutaneous QHS  . metronidazole  500 mg Intravenous Q8H  . pantoprazole  40 mg Oral Daily  . sodium chloride flush  3 mL Intravenous Q12H  . vancomycin  500 mg Oral Q6H   Continuous Infusions: . sodium chloride 50 mL/hr at 08/11/16 1700   PRN Meds:.sodium chloride, acetaminophen **OR** acetaminophen, diphenhydrAMINE, ondansetron **OR** ondansetron (ZOFRAN) IV, sodium chloride flush, traMADol  Micro Results Recent Results (from the past 240 hour(s))  C difficile quick scan w PCR reflex     Status: Abnormal   Collection Time: 08/10/16  7:00 AM  Result Value Ref Range Status   C Diff antigen POSITIVE (A) NEGATIVE Final   C Diff toxin POSITIVE (A) NEGATIVE Final    Comment: CRITICAL RESULT CALLED TO,  READ BACK BY AND VERIFIED WITH: [place],R. AT 1150 ON 08/10/2016 BY BAUGHAM,M.    C Diff interpretation Toxin producing C. difficile detected.  Final  Culture, blood (routine x 2)     Status: None (Preliminary result)   Collection Time: 08/11/16  2:28 PM  Result Value Ref Range Status   Specimen Description RIGHT ANTECUBITAL  Final   Special Requests BOTTLES DRAWN AEROBIC AND ANAEROBIC 6CC  Final   Culture NO GROWTH 3 DAYS  Final   Report Status PENDING  Incomplete  Culture, blood (routine x 2)  Status: None (Preliminary result)   Collection Time: 08/11/16  2:34 PM  Result Value Ref Range Status   Specimen Description LEFT ANTECUBITAL  Final   Special Requests BOTTLES DRAWN AEROBIC AND ANAEROBIC 6CC  Final   Culture NO GROWTH 3 DAYS  Final   Report Status PENDING  Incomplete    Radiology Reports Dg Abd 1 View  Result Date: 08/11/2016 CLINICAL DATA:  C. Difficile colitis. EXAM: ABDOMEN - 1 VIEW COMPARISON:  CT 08/09/2016 FINDINGS: Thickened colonic haustra involving the hepatic flexure and visualized descending colon, consistent with stated history of colitis. Portions of both flanks excluded from the field of view. No evidence of pneumatosis or free air. No small bowel dilatation to suggest ileus or obstruction. Probable air-filled stomach in the left mid abdomen. IMPRESSION: Thickened colonic haustra in the right and left colon consistent with stated history of colitis. Radiographic appearance is similar to CT 2 days prior. No small bowel dilatation to suggest ileus or obstruction. Electronically Signed   By: Jeb Levering M.D.   On: 08/11/2016 14:56   US Renal  Result Date: 07/29/2016 CLINICAL DATA:  Chronic renal disease. EXAM: RENAL / URINARY TRACT ULTRASOUND COMPLETE COMPARISON:  Ultrasound 02/10/2015 and 07/21/2010. FINDINGS: Right Kidney: Length: 10.0 cm. Increased echogenicity and cortical thinning again noted. No mass or hydronephrosis visualized. Left Kidney: Length:  13.8 cm. Increased echogenicity and cortical thinning again noted . No hydronephrosis visualized. Multiple cysts are again noted some of which are thinly septated most likely benign. The largest cyst measures 5.0 cm. No solid lesion. Similar findings noted on prior exam. Bladder: Appears normal for degree of bladder distention prostate enlargement. Prostate measures 5.1 x 5.4 x 5.1 cm. IMPRESSION: 1. Increased echogenicity cortical thinning again noted consistent chronic medical renal disease. Multiple benign-appearing cysts are again noted in the left kidney. Similar findings noted on prior exams. 2. Prostate enlargement. Bladder is nondistended. No hydronephrosis. Electronically Signed   By: Marcello Moores  Register   On: 07/29/2016 11:51   Ct Renal Stone Study  Result Date: 08/09/2016 CLINICAL DATA:  82 year old hypertensive male with diarrhea for the past month. Initial encounter. EXAM: CT ABDOMEN AND PELVIS WITHOUT CONTRAST TECHNIQUE: Multidetector CT imaging of the abdomen and pelvis was performed following the standard protocol without IV contrast. COMPARISON:  None. FINDINGS: Lower chest: No worrisome lung base abnormality. Cardiomegaly. Coronary artery calcifications. Hepatobiliary: Taking into account limitation by non contrast imaging, no worrisome hepatic lesion. Gallstones. Pancreas: Taking into account limitation by non contrast imaging, no pancreatic inflammation or mass. Spleen: Taking into account limitation by non contrast imaging, no enlargement or mass. Adrenals/Urinary Tract: No renal or ureteral obstructing stone. Bilateral renal cysts are noted. On the left, 2.4 cm indeterminate lesion. On the right, inferior pole 6 mm and 4 mm hyperdense lesion possibly hemorrhagic cysts. Indeterminate left renal lesion can be assessed if the patient's acute episode has cleared. Mild adrenal hyperplasia. Stomach/Bowel: Diffuse inflammation of the colon consistent with pancolitis with inflammation of surrounding  fat planes and small amount of free fluid extending superior to the liver and spleen. Currently no well-defined drainable abscess or free intraperitoneal air. Inflammatory process is not centered around the appendix. Under distended stomach without abnormality noted. Vascular/Lymphatic: Calcification of the aorta, common iliac arteries (with moderate narrowing), femoral arteries and aortic branch vessels. No adenopathy Reproductive: Enlarged prostate gland impresses upon the bladder base. Clinical laboratory correlation recommended. Other: Large left inguinal fat and vessel containing hernia with small amount of fluid along the inferior aspect  which extends into the scrotum. Musculoskeletal: Degenerative changes lumbar spine most notable L5-S1. Remote Schmorl's node deformity superior endplate L1. Prior right hip surgery. IMPRESSION: Significant diffuse pancolitis. This may be infectious in origin. Result of ischemia felt to be a secondary less likely consideration. Small amount of free fluid extending superior to the liver and spleen. Currently no well-defined drainable abscess or free intraperitoneal air. Large left inguinal fat and vessel containing hernia with small amount of fluid along the inferior aspect which extends into the scrotum. 2.4 cm indeterminate left renal lesion. This can be assessed on follow-up after the patient's colitis has been treated. Question 4 mm and 6 mm hyperdense cyst inferior aspect right kidney. Enlarged prostate gland impresses upon the bladder base. Clinical and laboratory correlation recommended. Gallstones. Cardiomegaly with coronary artery calcification. Calcification of the aorta, common iliac arteries (with moderate narrowing), femoral arteries and aortic branch vessels. These results were called by telephone at the time of interpretation on 08/09/2016 at 1:39 pm to Dr. Elnora Morrison , who verbally acknowledged these results. Electronically Signed   By: Genia Del M.D.    On: 08/09/2016 13:50    Time Spent in minutes  30   Jani Gravel M.D on 08/15/2016 at 10:16 AM  Between 7am to 7pm - Pager - [phone]  After 7pm go to [email or web address] - password [facility]  [facility] -  Office  [phone]

## 2016-08-16 LAB — GLUCOSE, CAPILLARY
GLUCOSE-CAPILLARY: 123 mg/dL — AB (ref 65–99)
Glucose-Capillary: 203 mg/dL — ABNORMAL HIGH (ref 65–99)

## 2016-08-16 LAB — CULTURE, BLOOD (ROUTINE X 2)
Culture: NO GROWTH
Culture: NO GROWTH

## 2016-08-16 LAB — COMPREHENSIVE METABOLIC PANEL
ALT: 11 U/L — AB (ref 17–63)
AST: 16 U/L (ref 15–41)
Albumin: 2 g/dL — ABNORMAL LOW (ref 3.5–5.0)
Alkaline Phosphatase: 71 U/L (ref 38–126)
Anion gap: 3 — ABNORMAL LOW (ref 5–15)
BILIRUBIN TOTAL: 0.3 mg/dL (ref 0.3–1.2)
BUN: 27 mg/dL — AB (ref 6–20)
CO2: 21 mmol/L — ABNORMAL LOW (ref 22–32)
CREATININE: 1.44 mg/dL — AB (ref 0.61–1.24)
Calcium: 9 mg/dL (ref 8.9–10.3)
Chloride: 113 mmol/L — ABNORMAL HIGH (ref 101–111)
GFR calc Af Amer: 51 mL/min — ABNORMAL LOW (ref >60)
GFR, EST NON AFRICAN AMERICAN: 44 mL/min — AB (ref >60)
GLUCOSE: 168 mg/dL — AB (ref 65–99)
Potassium: 4.7 mmol/L (ref 3.5–5.1)
Sodium: 137 mmol/L (ref 135–145)
TOTAL PROTEIN: 4.5 g/dL — AB (ref 6.5–8.1)

## 2016-08-16 LAB — CBC
HEMATOCRIT: 34.9 % — AB (ref 39.0–52.0)
HEMOGLOBIN: 11.2 g/dL — AB (ref 13.0–17.0)
MCH: 30 pg (ref 26.0–34.0)
MCHC: 32.1 g/dL (ref 30.0–36.0)
MCV: 93.6 fL (ref 78.0–100.0)
Platelets: 267 10*3/uL (ref 150–400)
RBC: 3.73 MIL/uL — AB (ref 4.22–5.81)
RDW: 14.7 % (ref 11.5–15.5)
WBC: 13.3 10*3/uL — AB (ref 4.0–10.5)

## 2016-08-16 MED ORDER — VANCOMYCIN 50 MG/ML ORAL SOLUTION: 500.0000 mg | Freq: Four times a day (QID) | ORAL | 0 refills | Status: AC

## 2016-08-16 MED ORDER — FUROSEMIDE 40 MG PO TABS: ORAL_TABLET | ORAL | Status: DC

## 2016-08-16 MED ORDER — SACCHAROMYCES BOULARDII 250 MG PO CAPS: 250.0000 mg | ORAL_CAPSULE | Freq: Two times a day (BID) | ORAL | 0 refills | Status: DC

## 2016-08-16 NOTE — Care Management Note (Signed)
Case Management Note  Patient Details  Name: Zachary Mcmahon MRN: [id number] Date of Birth: [birth year]  If discussed at Long Length of Stay Meetings, dates discussed:   08/16/2016 Additional Comments:  Monna Crean, Chauncey Reading, RN 08/16/2016, 10:29 AM

## 2016-08-16 NOTE — Care Management Important Message (Signed)
Important Message  Patient Details  Name: YEE JOSS MRN: [id number] Date of Birth: May 26, 1932   Medicare Important Message Given:  Yes    Banks Chaikin, Chauncey Reading, RN 08/16/2016, 11:43 AM

## 2016-08-16 NOTE — Progress Notes (Signed)
IV removed, WNL. D/C papers given to patient. Verbalized understanding. Pt awaiting ride back to private residence.

## 2016-08-16 NOTE — Discharge Summary (Signed)
Physician Discharge Summary  Zachary Mcmahon CVE:[id number] DOB: 09-21-1931 DOA: 08/09/2016  PCP: Wende Neighbors, MD  Admit date: 08/09/2016 Discharge date: 08/16/2016  Time spent: > 35 minutes  Recommendations for Outpatient Follow-up:  1. Reassess WBC levels 2. Decide when to continue Lasix   Discharge Diagnoses:  Principal Problem:   C. difficile colitis Active Problems:   HTN (hypertension), malignant   DM type 2 (diabetes mellitus, type 2) ([place])   Acute renal failure (HCC)   CKD (chronic kidney disease) stage 3, GFR 30-59 ml/min   Diarrhea   CHF (congestive heart failure) ([place])   Discharge Condition:Stable  Diet recommendation: Carb modified diet  Filed Weights   08/14/16 0645 08/15/16 0613 08/16/16 0610  Weight: 94.3 kg (207 lb 12.8 oz) 97.4 kg (214 lb 11.2 oz) 96.2 kg (212 lb 1.6 oz)    History of present illness:  82 y.o.malewith medical history significant of DM, CKD stage II Cr baseline 1.8, HTN who presents complaining of persistent diarrhea for 6 weeks. He has been evaluated by PCP and UC. He has stool test done which were 'negative'. He took antibiotics last oct for infection. Dr Reather Converse obtained record from [facility], which showed positive C diff PCR.  Patient is such was admitted and treated for C. difficile colitis.  Hospital Course:  C. difficile related diarrhea and colitis -Improving on IV metronidazole and oral vancomycin. - Patient had 7 days of combination antibiotic therapy listed above without much improvement in condition. Today he denies any diarrhea and states that it is slowing down. He feels comfortable going home and is requesting discharge. We'll discharge him on oral vancomycin for 7 more days to complete a 14 day treatment course.  Otherwise for known medical conditions above will continue medication regimen listed below  Procedures:  None  Consultations:  None  Discharge Exam: Vitals:   08/15/16 2103 08/16/16 0610  BP: (!) 152/66 (!)  169/67  Pulse: 61 (!) 57  Resp: 18 18  Temp: 98.1 F (36.7 C) 97.6 F (36.4 C)    General: Pt in nad, alert and awake Cardiovascular: rrr, no rubs,  Respiratory: no increased wob, no wheezes Abdomen: soft, nd, no guarding.  Discharge Instructions   Discharge Instructions    Call MD for:  redness, tenderness, or signs of infection (pain, swelling, redness, odor or green/yellow discharge around incision site)    Complete by:  As directed    Call MD for:  temperature >100.4    Complete by:  As directed    Diet - low sodium heart healthy    Complete by:  As directed    Discharge instructions    Complete by:  As directed    Should you continue to have diarrhea please hold off on taking Lasix. Recommend you follow-up with your primary care physician within the next 7 days. At that point please discuss when to continue diuretics. Continue taking your oral uncle Meissen for the next 7 days to complete a 14 day treatment course.   Increase activity slowly    Complete by:  As directed      Current Discharge Medication List    START taking these medications   Details  saccharomyces boulardii (FLORASTOR) 250 MG capsule Take 1 capsule (250 mg total) by mouth 2 (two) times daily. Qty: 60 capsule, Refills: 0    vancomycin (VANCOCIN) 50 mg/mL oral solution Take 10 mLs (500 mg total) by mouth every 6 (six) hours. Qty: 280 mL, Refills: 0  CONTINUE these medications which have CHANGED   Details  furosemide (LASIX) 40 MG tablet Take one tab (40mg ) by mouth every morning & 1/2 tab (20mg ) every evening Qty: 30 tablet      CONTINUE these medications which have NOT CHANGED   Details  acetaminophen (TYLENOL) 500 MG tablet Take 500 mg by mouth every 6 (six) hours as needed for mild pain.    apixaban (ELIQUIS) 2.5 MG TABS tablet Take 1 tablet (2.5 mg total) by mouth 2 (two) times daily. Qty: 28 tablet, Refills: 0    aspirin EC 81 MG tablet Take 81 mg by mouth every morning.     atorvastatin (LIPITOR) 10 MG tablet Take 10 mg by mouth daily.    Blood Pressure Monitoring (OMRON 7 SERIES BP MONITOR) DEVI 1 Units by Does not apply route as needed. Qty: 1 Device, Refills: 0    carvedilol (COREG) 6.25 MG tablet Take 1 tablet (6.25 mg total) by mouth 2 (two) times daily. Qty: 120 tablet, Refills: 0    ferrous sulfate 325 (65 FE) MG tablet Take 1 tablet (325 mg total) by mouth daily with breakfast. Refills: 3    gabapentin (NEURONTIN) 100 MG capsule Take 100 mg by mouth 3 (three) times daily.     LEVEMIR FLEXTOUCH 100 UNIT/ML Pen Inject 27 Units into the skin at bedtime.    traMADol (ULTRAM) 50 MG tablet Take 50 mg by mouth every 6 (six) hours as needed for moderate pain.      STOP taking these medications     diclofenac sodium (VOLTAREN) 1 % GEL      losartan (COZAAR) 100 MG tablet      omeprazole (PRILOSEC) 20 MG capsule        Allergies  Allergen Reactions  . Bactrim [Sulfamethoxazole-Trimethoprim] Rash      The results of significant diagnostics from this hospitalization (including imaging, microbiology, ancillary and laboratory) are listed below for reference.    Significant Diagnostic Studies: Dg Abd 1 View  Result Date: 08/11/2016 CLINICAL DATA:  C. Difficile colitis. EXAM: ABDOMEN - 1 VIEW COMPARISON:  CT 08/09/2016 FINDINGS: Thickened colonic haustra involving the hepatic flexure and visualized descending colon, consistent with stated history of colitis. Portions of both flanks excluded from the field of view. No evidence of pneumatosis or free air. No small bowel dilatation to suggest ileus or obstruction. Probable air-filled stomach in the left mid abdomen. IMPRESSION: Thickened colonic haustra in the right and left colon consistent with stated history of colitis. Radiographic appearance is similar to CT 2 days prior. No small bowel dilatation to suggest ileus or obstruction. Electronically Signed   By: Jeb Levering M.D.   On: 08/11/2016  14:56   US Renal  Result Date: 07/29/2016 CLINICAL DATA:  Chronic renal disease. EXAM: RENAL / URINARY TRACT ULTRASOUND COMPLETE COMPARISON:  Ultrasound 02/10/2015 and 07/21/2010. FINDINGS: Right Kidney: Length: 10.0 cm. Increased echogenicity and cortical thinning again noted. No mass or hydronephrosis visualized. Left Kidney: Length: 13.8 cm. Increased echogenicity and cortical thinning again noted . No hydronephrosis visualized. Multiple cysts are again noted some of which are thinly septated most likely benign. The largest cyst measures 5.0 cm. No solid lesion. Similar findings noted on prior exam. Bladder: Appears normal for degree of bladder distention prostate enlargement. Prostate measures 5.1 x 5.4 x 5.1 cm. IMPRESSION: 1. Increased echogenicity cortical thinning again noted consistent chronic medical renal disease. Multiple benign-appearing cysts are again noted in the left kidney. Similar findings noted on prior exams. 2.  Prostate enlargement. Bladder is nondistended. No hydronephrosis. Electronically Signed   By: Marcello Moores  Register   On: 07/29/2016 11:51   Ct Renal Stone Study  Result Date: 08/09/2016 CLINICAL DATA:  82 year old hypertensive male with diarrhea for the past month. Initial encounter. EXAM: CT ABDOMEN AND PELVIS WITHOUT CONTRAST TECHNIQUE: Multidetector CT imaging of the abdomen and pelvis was performed following the standard protocol without IV contrast. COMPARISON:  None. FINDINGS: Lower chest: No worrisome lung base abnormality. Cardiomegaly. Coronary artery calcifications. Hepatobiliary: Taking into account limitation by non contrast imaging, no worrisome hepatic lesion. Gallstones. Pancreas: Taking into account limitation by non contrast imaging, no pancreatic inflammation or mass. Spleen: Taking into account limitation by non contrast imaging, no enlargement or mass. Adrenals/Urinary Tract: No renal or ureteral obstructing stone. Bilateral renal cysts are noted. On the left,  2.4 cm indeterminate lesion. On the right, inferior pole 6 mm and 4 mm hyperdense lesion possibly hemorrhagic cysts. Indeterminate left renal lesion can be assessed if the patient's acute episode has cleared. Mild adrenal hyperplasia. Stomach/Bowel: Diffuse inflammation of the colon consistent with pancolitis with inflammation of surrounding fat planes and small amount of free fluid extending superior to the liver and spleen. Currently no well-defined drainable abscess or free intraperitoneal air. Inflammatory process is not centered around the appendix. Under distended stomach without abnormality noted. Vascular/Lymphatic: Calcification of the aorta, common iliac arteries (with moderate narrowing), femoral arteries and aortic branch vessels. No adenopathy Reproductive: Enlarged prostate gland impresses upon the bladder base. Clinical laboratory correlation recommended. Other: Large left inguinal fat and vessel containing hernia with small amount of fluid along the inferior aspect which extends into the scrotum. Musculoskeletal: Degenerative changes lumbar spine most notable L5-S1. Remote Schmorl's node deformity superior endplate L1. Prior right hip surgery. IMPRESSION: Significant diffuse pancolitis. This may be infectious in origin. Result of ischemia felt to be a secondary less likely consideration. Small amount of free fluid extending superior to the liver and spleen. Currently no well-defined drainable abscess or free intraperitoneal air. Large left inguinal fat and vessel containing hernia with small amount of fluid along the inferior aspect which extends into the scrotum. 2.4 cm indeterminate left renal lesion. This can be assessed on follow-up after the patient's colitis has been treated. Question 4 mm and 6 mm hyperdense cyst inferior aspect right kidney. Enlarged prostate gland impresses upon the bladder base. Clinical and laboratory correlation recommended. Gallstones. Cardiomegaly with coronary artery  calcification. Calcification of the aorta, common iliac arteries (with moderate narrowing), femoral arteries and aortic branch vessels. These results were called by telephone at the time of interpretation on 08/09/2016 at 1:39 pm to Dr. Elnora Morrison , who verbally acknowledged these results. Electronically Signed   By: Genia Del M.D.   On: 08/09/2016 13:50    Microbiology: Recent Results (from the past 240 hour(s))  C difficile quick scan w PCR reflex     Status: Abnormal   Collection Time: 08/10/16  7:00 AM  Result Value Ref Range Status   C Diff antigen POSITIVE (A) NEGATIVE Final   C Diff toxin POSITIVE (A) NEGATIVE Final    Comment: CRITICAL RESULT CALLED TO, READ BACK BY AND VERIFIED WITH: [place],R. AT 1150 ON 08/10/2016 BY BAUGHAM,M.    C Diff interpretation Toxin producing C. difficile detected.  Final  Culture, blood (routine x 2)     Status: None   Collection Time: 08/11/16  2:28 PM  Result Value Ref Range Status   Specimen Description RIGHT ANTECUBITAL  Final  Special Requests BOTTLES DRAWN AEROBIC AND ANAEROBIC 6CC  Final   Culture NO GROWTH 5 DAYS  Final   Report Status 08/16/2016 FINAL  Final  Culture, blood (routine x 2)     Status: None   Collection Time: 08/11/16  2:34 PM  Result Value Ref Range Status   Specimen Description LEFT ANTECUBITAL  Final   Special Requests BOTTLES DRAWN AEROBIC AND ANAEROBIC [facility]  Final   Culture NO GROWTH 5 DAYS  Final   Report Status 08/16/2016 FINAL  Final     Labs: Basic Metabolic Panel:  Recent Labs Lab 08/10/16 0452 08/10/16 1515 08/11/16 0542 08/12/16 0444 08/15/16 0710 08/16/16 0454  NA 137  --  135 134* 134* 137  K 4.0  --  3.6 3.9 4.5 4.7  CL 113*  --  110 110 112* 113*  CO2 20*  --  21* 21* 21* 21*  GLUCOSE 170*  --  120* 139* 121* 168*  BUN 48*  --  47* 46* 30* 27*  CREATININE 1.67*  --  1.94* 1.99* 1.38* 1.44*  CALCIUM 9.0  --  9.0 8.7* 8.7* 9.0  MG  --  1.3* 1.8 1.7  --   --    Liver Function  Tests:  Recent Labs Lab 08/16/16 0454  AST 16  ALT 11*  ALKPHOS 71  BILITOT 0.3  PROT 4.5*  ALBUMIN 2.0*   No results for input(s): LIPASE, AMYLASE in the last 168 hours. No results for input(s): AMMONIA in the last 168 hours. CBC:  Recent Labs Lab 08/11/16 0542 08/12/16 0444 08/14/16 0614 08/15/16 0710 08/16/16 0454  WBC 26.2* 22.2* 15.6* 13.3* 13.3*  HGB 11.2* 10.2* 11.0* 10.9* 11.2*  HCT 34.7* 32.2* 33.3* 33.4* 34.9*  MCV 93.0 94.2 92.8 92.8 93.6  PLT 220 209 240 246 267   Cardiac Enzymes: No results for input(s): CKTOTAL, CKMB, CKMBINDEX, TROPONINI in the last 168 hours. BNP: BNP (last 3 results)  Recent Labs  10/03/15 1948 01/08/16 1600 05/19/16 0423  BNP 1,531.0* 1,905.0* 2,598.0*    ProBNP (last 3 results) No results for input(s): PROBNP in the last 8760 hours.  CBG:  Recent Labs Lab 08/15/16 1136 08/15/16 1642 08/15/16 2059 08/16/16 0734 08/16/16 1109  GLUCAP 194* 189* 157* 123* 203*    Signed:  Velvet Bathe MD.  [facility] 08/16/2016, 12:39 PM

## 2016-08-16 NOTE — Progress Notes (Signed)
Physical Therapy Treatment Patient Details Name: Zachary Mcmahon MRN: [id number] DOB: 03-08-1932 Today's Date: 08/16/2016    History of Present Illness 82 y.o. male with medical history significant of DM, CKD stage II Cr baseline 1.8, HTN who presents complaining of persistent diarrhea for 6 weeks. He has been evaluated by PCP and UC. He has stool test done which were 'negative'. He took antibiotics last oct for infection. Dr Reather Converse obtained record from [place], which showed positive C diff PCR. Patient report 5 Watery Bowel movement per day. Report fatigue. He has not been taking his lasix.    PT Comments    Pt improved with sit to stand after several attempts.  Pt continues to improve in strength and gait.    Follow Up Recommendations  Home health PT     Equipment Recommendations  None recommended by PT    Recommendations for Other Services       Precautions / Restrictions Restrictions Weight Bearing Restrictions: No    Mobility  Bed Mobility Overal bed mobility: Needs Assistance Bed Mobility: Supine to Sit     Supine to sit: Min assist (increased time and use of bed pad to assist pt's hips to the EOB. )     General bed mobility comments: Pt sitting up in bedside chair upon arrival  Transfers Overall transfer level: Needs assistance Equipment used: Rolling walker (2 wheeled) Transfers: Sit to/from Stand Sit to Stand: Min assist         General transfer comment: MinA from bed, ModA from chair; verbal cues for hand placement.   Ambulation/Gait Ambulation/Gait assistance: Min assist Ambulation Distance (Feet): 25 Feet Assistive device: Rolling walker (2 wheeled) Gait Pattern/deviations: Step-to pattern;Trunk flexed         Stairs            Wheelchair Mobility    Modified Rankin (Stroke Patients Only)       Balance                                    Cognition Arousal/Alertness: Awake/alert Behavior During Therapy: WFL for tasks  assessed/performed Overall Cognitive Status: Within Functional Limits for tasks assessed                      Exercises General Exercises - Lower Extremity Ankle Circles/Pumps: Both;5 reps Heel Slides: Both;5 reps Hip ABduction/ADduction: Both;5 reps Straight Leg Raises: Both;5 reps Mini-Sqauts:  (bridging x 10)    General Comments        Pertinent Vitals/Pain      Home Living                      Prior Function            PT Goals (current goals can now be found in the care plan section) Acute Rehab PT Goals Patient Stated Goal: Pt wants to regain strength to go back home PT Goal Formulation: With patient Time For Goal Achievement: 08/17/16 Potential to Achieve Goals: Fair Progress towards PT goals: Progressing toward goals    Frequency           PT Plan Current plan remains appropriate    Co-evaluation             End of Session Equipment Utilized During Treatment: Gait belt Activity Tolerance: Patient tolerated treatment well Patient left: in chair     Time: 6256-3893  PT Time Calculation (min) (ACUTE ONLY): 85 min  Charges:  $Gait Training: 8-22 mins $Therapeutic Exercise: 8-22 mins                    Rayetta Humphrey, PT CLT [phone] 08/16/2016, 11:55 AM

## 2016-08-18 DIAGNOSIS — Z7982 Long term (current) use of aspirin: Secondary | ICD-10-CM | POA: Diagnosis not present

## 2016-08-18 DIAGNOSIS — E1122 Type 2 diabetes mellitus with diabetic chronic kidney disease: Secondary | ICD-10-CM | POA: Diagnosis not present

## 2016-08-18 DIAGNOSIS — N183 Chronic kidney disease, stage 3 (moderate): Secondary | ICD-10-CM | POA: Diagnosis not present

## 2016-08-18 DIAGNOSIS — Z794 Long term (current) use of insulin: Secondary | ICD-10-CM | POA: Diagnosis not present

## 2016-08-18 DIAGNOSIS — I129 Hypertensive chronic kidney disease with stage 1 through stage 4 chronic kidney disease, or unspecified chronic kidney disease: Secondary | ICD-10-CM | POA: Diagnosis not present

## 2016-08-18 DIAGNOSIS — Z7901 Long term (current) use of anticoagulants: Secondary | ICD-10-CM | POA: Diagnosis not present

## 2016-08-18 DIAGNOSIS — I509 Heart failure, unspecified: Secondary | ICD-10-CM | POA: Diagnosis not present

## 2016-08-18 DIAGNOSIS — A0471 Enterocolitis due to Clostridium difficile, recurrent: Secondary | ICD-10-CM | POA: Diagnosis not present

## 2016-08-23 DIAGNOSIS — N184 Chronic kidney disease, stage 4 (severe): Secondary | ICD-10-CM | POA: Diagnosis not present

## 2016-08-23 DIAGNOSIS — Z6827 Body mass index (BMI) 27.0-27.9, adult: Secondary | ICD-10-CM | POA: Diagnosis not present

## 2016-08-23 DIAGNOSIS — E1122 Type 2 diabetes mellitus with diabetic chronic kidney disease: Secondary | ICD-10-CM | POA: Diagnosis not present

## 2016-08-23 DIAGNOSIS — I2510 Atherosclerotic heart disease of native coronary artery without angina pectoris: Secondary | ICD-10-CM | POA: Diagnosis not present

## 2016-08-24 DIAGNOSIS — N183 Chronic kidney disease, stage 3 (moderate): Secondary | ICD-10-CM | POA: Diagnosis not present

## 2016-08-24 DIAGNOSIS — E1122 Type 2 diabetes mellitus with diabetic chronic kidney disease: Secondary | ICD-10-CM | POA: Diagnosis not present

## 2016-08-24 DIAGNOSIS — I509 Heart failure, unspecified: Secondary | ICD-10-CM | POA: Diagnosis not present

## 2016-08-24 DIAGNOSIS — A0471 Enterocolitis due to Clostridium difficile, recurrent: Secondary | ICD-10-CM | POA: Diagnosis not present

## 2016-08-24 DIAGNOSIS — Z7982 Long term (current) use of aspirin: Secondary | ICD-10-CM | POA: Diagnosis not present

## 2016-08-24 DIAGNOSIS — Z7901 Long term (current) use of anticoagulants: Secondary | ICD-10-CM | POA: Diagnosis not present

## 2016-08-24 DIAGNOSIS — I129 Hypertensive chronic kidney disease with stage 1 through stage 4 chronic kidney disease, or unspecified chronic kidney disease: Secondary | ICD-10-CM | POA: Diagnosis not present

## 2016-08-24 DIAGNOSIS — Z794 Long term (current) use of insulin: Secondary | ICD-10-CM | POA: Diagnosis not present

## 2016-08-26 ENCOUNTER — Other Ambulatory Visit: Payer: Self-pay | Admitting: Cardiovascular Disease

## 2016-08-26 ENCOUNTER — Other Ambulatory Visit: Payer: Self-pay | Admitting: Cardiology

## 2016-08-26 MED ORDER — CARVEDILOL 6.25 MG PO TABS: 6.2500 mg | ORAL_TABLET | Freq: Two times a day (BID) | ORAL | 0 refills | Status: DC

## 2016-08-26 NOTE — Telephone Encounter (Signed)
Refill:    carvedilol (COREG) 6.25 MG tablet  [facility]

## 2016-08-26 NOTE — Telephone Encounter (Signed)
Medication sent to pharmacy  

## 2016-08-31 ENCOUNTER — Telehealth: Payer: Self-pay | Admitting: *Deleted

## 2016-08-31 MED ORDER — CARVEDILOL 12.5 MG PO TABS: 12.5000 mg | ORAL_TABLET | Freq: Two times a day (BID) | ORAL | 3 refills | Status: DC

## 2016-08-31 NOTE — Telephone Encounter (Signed)
Coreg 12.5 mg bid.

## 2016-08-31 NOTE — Telephone Encounter (Signed)
Zachary Mcmahon made aware - #[phone] sent to [facility] as requested

## 2016-08-31 NOTE — Telephone Encounter (Signed)
Margaretha Sheffield home health nurse says pt BP has been running 140s/70s - pt needs refills on coreg - will verify with provider to refill 6.25 mg bid or 12.5 mg bid of Coreg - was increased 06/23/16 phone note

## 2016-09-01 DIAGNOSIS — E1122 Type 2 diabetes mellitus with diabetic chronic kidney disease: Secondary | ICD-10-CM | POA: Diagnosis not present

## 2016-09-05 DIAGNOSIS — R197 Diarrhea, unspecified: Secondary | ICD-10-CM | POA: Diagnosis not present

## 2016-09-05 LAB — CLOSTRIDIUM DIFFICILE BY PCR

## 2016-09-21 ENCOUNTER — Other Ambulatory Visit: Payer: Self-pay | Admitting: Cardiovascular Disease

## 2016-10-04 ENCOUNTER — Encounter: Payer: PPO | Attending: Internal Medicine | Admitting: Nutrition

## 2016-10-04 VITALS — Ht 69.0 in | Wt 211.0 lb

## 2016-10-04 DIAGNOSIS — Z794 Long term (current) use of insulin: Secondary | ICD-10-CM

## 2016-10-04 DIAGNOSIS — E669 Obesity, unspecified: Secondary | ICD-10-CM

## 2016-10-04 DIAGNOSIS — Z713 Dietary counseling and surveillance: Secondary | ICD-10-CM | POA: Insufficient documentation

## 2016-10-04 DIAGNOSIS — E118 Type 2 diabetes mellitus with unspecified complications: Secondary | ICD-10-CM | POA: Insufficient documentation

## 2016-10-04 DIAGNOSIS — E1165 Type 2 diabetes mellitus with hyperglycemia: Secondary | ICD-10-CM

## 2016-10-04 DIAGNOSIS — IMO0002 Reserved for concepts with insufficient information to code with codable children: Secondary | ICD-10-CM

## 2016-10-04 NOTE — Progress Notes (Signed)
Diabetes Self-Management Education  Visit Type: First/Initial  Appt. Start Time: 1100 Appt. End Time: 1200  10/04/2016  [name], identified by name and date of birth, is a 83 y.o. male with a diagnosis of Diabetes: Type 2. He is here with Jiles Garter with [facility]. He and Jiles Garter note he has fluid in his legs. They will call Dr. Nevada Crane about that since he was told to stop taking his lasix while getting over C Diff. Was hospitalized in Dec 2017 for CDIff. Has lost about 20 lbs in last  ASSESSMENT  Height 5\' 9"  (1.753 m), weight 211 lb (95.7 kg). Body mass index is 31.16 kg/m.      Diabetes Self-Management Education - 10/04/16 1058      Visit Information   Visit Type First/Initial     Initial Visit   Diabetes Type Type 2   Are you taking your medications as prescribed? Yes   Date Diagnosed [place]   How would you rate your overall health? Good     Psychosocial Assessment   Self-care barriers None   Self-management support Family;Doctor's office   Other persons present Patient  Jiles Garter from [place]   Patient Concerns Nutrition/Meal planning;Monitoring;Healthy Lifestyle;Glycemic Control   Special Needs None   Preferred Learning Style No preference indicated   Learning Readiness Ready   How often do you need to have someone help you when you read instructions, pamphlets, or other written materials from your doctor or pharmacy? 1 - Never   What is the last grade level you completed in school? 12     Pre-Education Assessment   Patient understands the diabetes disease and treatment process. Needs Review   Patient understands incorporating nutritional management into lifestyle. Needs Review   Patient undertands incorporating physical activity into lifestyle. Needs Review   Patient understands using medications safely. Needs Review   Patient understands monitoring blood glucose, interpreting and using results Needs Review   Patient  understands prevention, detection, and treatment of acute complications. Needs Review   Patient understands prevention, detection, and treatment of chronic complications. Needs Review   Patient understands how to develop strategies to address psychosocial issues. Needs Review   Patient understands how to develop strategies to promote health/change behavior. Needs Review     Complications   Last HgB A1C per patient/outside source 6.6 %   How often do you check your blood sugar? 1-2 times/day   Fasting Blood glucose range (mg/dL) 70-129   Postprandial Blood glucose range (mg/dL) 130-179   Number of hypoglycemic episodes per month 3   Can you tell when your blood sugar is low? Yes   What do you do if your blood sugar is low? eat honey or something to bring it up   Have you had a dilated eye exam in the past 12 months? Yes   Have you had a dental exam in the past 12 months? Yes   Are you checking your feet? Yes   How many days per week are you checking your feet? 7     Dietary Intake   Breakfast Eggs, toast or oatmeal, coffee, water   Snack (morning) apple   Lunch Toss salad, no meal, lite [place], [employer] (afternoon) none   Dinner Rice, chicken, water   Beverage(s) ater     Exercise   Exercise Type ADL's     Patient Education   Previous Diabetes Education No   Disease state  Definition  of diabetes, type 1 and 2, and the diagnosis of diabetes;Factors that contribute to the development of diabetes;Explored patient's options for treatment of their diabetes   Nutrition management  Role of diet in the treatment of diabetes and the relationship between the three main macronutrients and blood glucose level;Food label reading, portion sizes and measuring food.;Reviewed blood glucose goals for pre and post meals and how to evaluate the patients' food intake on their blood glucose level.;Meal timing in regards to the patients' current diabetes medication.;Meal options for control of blood  glucose level and chronic complications.   Physical activity and exercise  Role of exercise on diabetes management, blood pressure control and cardiac health.   Medications Taught/reviewed insulin injection, site rotation, insulin storage and needle disposal.;Reviewed patients medication for diabetes, action, purpose, timing of dose and side effects.;Reviewed medication adjustment guidelines for hyperglycemia and sick days.   Monitoring Taught/evaluated SMBG meter.;Purpose and frequency of SMBG.;Taught/discussed recording of test results and interpretation of SMBG.;Interpreting lab values - A1C, lipid, urine microalbumina.;Identified appropriate SMBG and/or A1C goals.;Daily foot exams   Acute complications Taught treatment of hypoglycemia - the 15 rule.   Chronic complications Assessed and discussed foot care and prevention of foot problems;Lipid levels, blood glucose control and heart disease;Identified and discussed with patient  current chronic complications;Retinopathy and reason for yearly dilated eye exams;Nephropathy, what it is, prevention of, the use of ACE, ARB's and early detection of through urine microalbumia.   Psychosocial adjustment Helped patient identify a support system for diabetes management;Brainstormed with patient on coping mechanisms for social situations, getting support from significant others, dealing with feelings about diabetes   Personal strategies to promote health Helped patient develop diabetes management plan for (enter comment)     Individualized Goals (developed by patient)   Nutrition Follow meal plan discussed;General guidelines for healthy choices and portions discussed;Adjust meds/carbs with exercise as discussed   Medications take my medication as prescribed   Monitoring  test my blood glucose as discussed;send in my blood glucose log as discussed   Reducing Risk examine blood glucose patterns;get labs drawn     Post-Education Assessment   Patient understands  the diabetes disease and treatment process. Demonstrates understanding / competency   Patient understands incorporating nutritional management into lifestyle. Needs Review   Patient undertands incorporating physical activity into lifestyle. Demonstrates understanding / competency   Patient understands using medications safely. Demonstrates understanding / competency   Patient understands monitoring blood glucose, interpreting and using results Demonstrates understanding / competency   Patient understands prevention, detection, and treatment of acute complications. Demonstrates understanding / competency   Patient understands prevention, detection, and treatment of chronic complications. Demonstrates understanding / competency   Patient understands how to develop strategies to address psychosocial issues. Demonstrates understanding / competency   Patient understands how to develop strategies to promote health/change behavior. Demonstrates understanding / competency     Outcomes   Expected Outcomes Demonstrated interest in learning. Expect positive outcomes   Future DMSE 4-6 wks   Program Status Completed      Individualized Plan for Diabetes Self-Management Training:   Learning Objective:  Patient will have a greater understanding of diabetes self-management. Patient education plan is to attend individual and/or group sessions per assessed needs and concerns.   Plan:   Patient Instructions  Goals 1. Follow Plate Method 2. Eat 2-3 carb choices per meal 3. Avoid snacks between meals. 4. Drink water 5. Make sure blood sugar above 100 before bedtime and before you take your Levemir.  Keep fruit juice or honey next to bed if BS is low. 6. Snack on vegetables.    Expected Outcomes:  Demonstrated interest in learning. Expect positive outcomes  Education material provided: Living Well with Diabetes, Food label handouts, A1C conversion sheet, Meal plan card and Carbohydrate counting  sheet  If problems or questions, patient to contact team via:  Phone and Email  Future DSME appointment: 4-6 wks  His A1C has been 6.6%. He notes he has low blood sugars a few times per week. He adjusts his Levemir according to what he thinks he should take to prevent hypoglycemia in the morning. He takes 8-10 units at night depending.  Would recommend to reduce Levemir to 6- 8 units nightly to prevent hypoglycemia due to his age and risks of hypoglcyemia and falls. Requested He or Jiles Garter to talk to Dr. Juel Burrow office regarding this.      Recommend to resume low dose or Lasix due to his swelling in his legs and he his better hydrated now.

## 2016-10-04 NOTE — Patient Instructions (Addendum)
Goals 1. Follow Plate Method 2. Eat 2-3 carb choices per meal 3. Avoid snacks between meals. 4. Drink water 5. Make sure blood sugar above 100 before bedtime and before you take your Levemir. Keep fruit juice or honey next to bed if BS is low. 6. Snack on vegetables.

## 2016-10-18 ENCOUNTER — Ambulatory Visit (INDEPENDENT_AMBULATORY_CARE_PROVIDER_SITE_OTHER): Payer: PPO | Admitting: *Deleted

## 2016-10-18 DIAGNOSIS — I2782 Chronic pulmonary embolism: Secondary | ICD-10-CM

## 2016-10-18 NOTE — Progress Notes (Signed)
Pt was started on Eliquis 5mg  bid for Pulmonary embolus while in hospital in July 2016. Dose was decreased to 2.5mg  bid at OV 04/10/15 with Dr Bronson Ing due to age and renal function.  Pt denies problems taking Eliquis. He has not had any bleeding, excessive bruising.  Was in hospital 08/15/16 for C-diff.    Reviewed patients medication list. Pt is not currently on any combined P-gp and strong CYP3A4 inhibitors/inducers (ketoconazole, traconazole, ritonavir, carbamazepine, phenytoin, rifampin, St. John's wort). Reviewed labs from 08/16/16. SCr 1.44, Weight 93.2kg, CrCl 52. SCr has improved from 1.66 but borderline. Will leave pt on 2.5mg  bid for 3 more months and re-evaluate SrCr at that time.. Dose is appropriate based on 2 out of 3 criteria (age,wt,SrCr). Hgb and HCT:  11.2/34.9   A full discussion of the nature of anticoagulants has been carried out. A benefit/risk analysis has been presented to the patient, so that they understand the justification for choosing anticoagulation with Eliquis at this time. The need for compliance is stressed. Pt is aware to take the medication twice daily. Side effects of potential bleeding are discussed, including unusual colored urine or stools, coughing up blood or coffee ground emesis, nose bleeds or serious fall or head trauma. Discussed signs and symptoms of stroke. The patient should avoid any OTC items containing aspirin or ibuprofen. Avoid alcohol consumption. Call if any signs of abnormal bleeding. Discussed financial obligations and resolved any difficulty in obtaining medication..   Discussed lab results with pt. Kidney function stable. Follow up in 3 months. Appt made.

## 2016-10-24 ENCOUNTER — Ambulatory Visit (INDEPENDENT_AMBULATORY_CARE_PROVIDER_SITE_OTHER): Payer: PPO | Admitting: Cardiovascular Disease

## 2016-10-24 ENCOUNTER — Encounter: Payer: Self-pay | Admitting: Cardiovascular Disease

## 2016-10-24 VITALS — BP 179/104 | HR 80 | Ht 69.0 in | Wt 202.0 lb

## 2016-10-24 DIAGNOSIS — I10 Essential (primary) hypertension: Secondary | ICD-10-CM | POA: Diagnosis not present

## 2016-10-24 DIAGNOSIS — I429 Cardiomyopathy, unspecified: Secondary | ICD-10-CM | POA: Diagnosis not present

## 2016-10-24 DIAGNOSIS — R9439 Abnormal result of other cardiovascular function study: Secondary | ICD-10-CM

## 2016-10-24 DIAGNOSIS — I25768 Atherosclerosis of bypass graft of coronary artery of transplanted heart with other forms of angina pectoris: Secondary | ICD-10-CM

## 2016-10-24 DIAGNOSIS — I5042 Chronic combined systolic (congestive) and diastolic (congestive) heart failure: Secondary | ICD-10-CM

## 2016-10-24 DIAGNOSIS — R600 Localized edema: Secondary | ICD-10-CM

## 2016-10-24 DIAGNOSIS — R000 Tachycardia, unspecified: Secondary | ICD-10-CM

## 2016-10-24 DIAGNOSIS — Z951 Presence of aortocoronary bypass graft: Secondary | ICD-10-CM

## 2016-10-24 MED ORDER — CARVEDILOL 6.25 MG PO TABS: 6.2500 mg | ORAL_TABLET | Freq: Two times a day (BID) | ORAL | 3 refills | Status: DC

## 2016-10-24 MED ORDER — AMLODIPINE BESYLATE 5 MG PO TABS: 5.0000 mg | ORAL_TABLET | Freq: Every day | ORAL | 6 refills | Status: DC

## 2016-10-24 NOTE — Patient Instructions (Signed)
Medication Instructions:   Decrease Coreg to 6.25mg  twice a day.  Begin Norvasc 5mg  daily.  Continue all other medications.    Labwork: none  Testing/Procedures: none  Follow-Up: Your physician wants you to follow up in: 6 months.  You will receive a reminder letter in the mail one-two months in advance.  If you don't receive a letter, please call our office to schedule the follow up appointment   Any Other Special Instructions Will Be Listed Below (If Applicable).  If you need a refill on your cardiac medications before your next appointment, please call your pharmacy.

## 2016-10-24 NOTE — Progress Notes (Signed)
SUBJECTIVE: The patient returns for follow-up of coronary artery disease and chronic systolic and diastolic heart failure. Nuclear stress test on 10/23/15 demonstrated a large defect involving the inferoseptal, inferior, and lateral wall extending from the apex to the base with some degree of reversibility suggestive of moderate lateral ischemia. It was deemed a high risk study.  Echocardiogram performed on 10/04/15 demonstrated mild to moderately reduced left ventricular systolic function, EF 57-84%, with inferior wall hypokinesis, grade 2 diastolic dysfunction , and mild to moderate mitral regurgitation.   He underwent 5 vessel coronary artery bypass graft surgery on 02/17/15. He has a history of pulmonary embolism and is anticoagulated with Eliquis. He also has chronic kidney disease stage III. The EF had dropped and had previously been normal , 50-55%, in July 2016.  He denies chest pain and shortness of breath. He tells me his blood pressure has remained elevated. It is 179/104 today. He had to cut back on Lasix from 40 mg every morning and 20 mg every evening to 20 mg twice daily as it was reportedly interfering with one of his antibiotics. He stopped taking hydralazine due to a rash. Higher doses of carvedilol have led to fatigue. He said whenever he sits down he falls asleep. He does not fall sleep driving.  He has a caretaker for his wife who has Alzheimer's disease.    Review of Systems: As per "subjective", otherwise negative.  Allergies  Allergen Reactions  . Bactrim [Sulfamethoxazole-Trimethoprim] Rash    Current Outpatient Prescriptions  Medication Sig Dispense Refill  . acetaminophen (TYLENOL) 500 MG tablet Take 500 mg by mouth every 6 (six) hours as needed for mild pain.    Marland Kitchen apixaban (ELIQUIS) 2.5 MG TABS tablet Take 1 tablet (2.5 mg total) by mouth 2 (two) times daily. 28 tablet 0  . aspirin EC 81 MG tablet Take 81 mg by mouth every morning.    Marland Kitchen atorvastatin  (LIPITOR) 10 MG tablet Take 10 mg by mouth daily.    . Blood Pressure Monitoring (OMRON 7 SERIES BP MONITOR) DEVI 1 Units by Does not apply route as needed. 1 Device 0  . carvedilol (COREG) 12.5 MG tablet Take 1 tablet (12.5 mg total) by mouth 2 (two) times daily. 180 tablet 3  . ferrous sulfate 325 (65 FE) MG tablet Take 1 tablet (325 mg total) by mouth daily with breakfast.  3  . furosemide (LASIX) 40 MG tablet Take one tab (40mg ) by mouth every morning & 1/2 tab (20mg ) every evening 30 tablet   . gabapentin (NEURONTIN) 100 MG capsule Take 100 mg by mouth 3 (three) times daily.     Marland Kitchen LEVEMIR FLEXTOUCH 100 UNIT/ML Pen Inject 27 Units into the skin at bedtime. (Patient taking differently: Inject 12 Units into the skin at bedtime. )    . saccharomyces boulardii (FLORASTOR) 250 MG capsule Take 1 capsule (250 mg total) by mouth 2 (two) times daily. 60 capsule 0  . traMADol (ULTRAM) 50 MG tablet Take 50 mg by mouth every 6 (six) hours as needed for moderate pain.     No current facility-administered medications for this visit.     Past Medical History:  Diagnosis Date  . Arthritis    "legs" (02/07/2015)  . Basal cell carcinoma of forehead 2016 X 2  . Basal cell carcinoma of left earlobe   . CHF (congestive heart failure) ([place])   . Diabetic peripheral neuropathy ([place])    "left foot" (02/07/2015)  . GERD (  gastroesophageal reflux disease)   . High potassium   . History of gout X 1  . Hyperlipidemia   . Hypertension   . Old myocardial infarct    "sometime in the past; don't know when" (02/07/2015)  . Pain and swelling of left lower leg    chronic  . Pneumonia X 1  . Renal insufficiency   . Respiratory failure ([place])   . Type II diabetes mellitus ([place])   . Varicose veins     Past Surgical History:  Procedure Laterality Date  . BASAL CELL CARCINOMA EXCISION     "probably 1/2 dozen cut off face, left ear" (02/07/2015)  . CARDIAC CATHETERIZATION N/A 02/09/2015   Procedure: Left Heart Cath  and Coronary Angiography;  Surgeon: Leonie Man, MD;  Location: [facility];  Service: Cardiovascular;  Laterality: N/A;  . CARDIOVASCULAR STRESS TEST  05/29/2012   Mild-moderate perfusion defect due to infarct/scar with mild-moderate perinfarct ischemia seen in the mid anterior, apical anterior, apical septal, and apical regions. No ECG changes. Global LV systolic function is severely reduced.  Marland Kitchen CATARACT EXTRACTION W/PHACO Left 11/12/2015   Procedure: CATARACT EXTRACTION PHACO AND INTRAOCULAR LENS PLACEMENT LEFT EYE;  Surgeon: Tonny Branch, MD;  Location: [facility];  Service: Ophthalmology;  Laterality: Left;  CDE: 9.82  . CATARACT EXTRACTION W/PHACO Right 12/14/2015   Procedure: CATARACT EXTRACTION PHACO AND INTRAOCULAR LENS PLACEMENT (IOC);  Surgeon: Tonny Branch, MD;  Location: [facility];  Service: Ophthalmology;  Laterality: Right;  CDE: 13.86  . CORONARY ARTERY BYPASS GRAFT N/A 02/17/2015   Procedure: CORONARY ARTERY BYPASS GRAFTING (CABG), ON PUMP, TIMES FIVE, USING LEFT INTERNAL MAMMARY ARTERY, RIGHT GREATER SAPHENOUS VEIN HARVESTED ENDOSCOPICALLY;  Surgeon: [name], MD;  Location: [facility];  Service: Open Heart Surgery;  Laterality: N/A;  -LIMA to LAD -SVG to DIAGONAL - SEQ SVG to OM1 and PLB -SVG to PDA  . CYSTOSCOPY W/ STONE MANIPULATION  X 1  . FRACTURE SURGERY    . KNEE ARTHROSCOPY Right ~ 2008  . LEXISCAM MYOCARDIAL PERFUSION  05/29/12   MARKED PERFUSION DEFECT DUE TO INFARC/SCAR WITH MILD PERINFARCT ISCHEMIA IN THE BASAL INFERIOR, MID INFEROSEPTAL, MID INFERIOR AND APICALINFERIOR REGION. EF%33%. PERIINFARCT ISCHEMIA IN THE MID ANTERIOR, APICAL ANTERIOR, APICAL SEPTAL AND APICAL REGIONS.  . LOWER EXTREMITY VENOUS DOPPLER  07/11/2012   No evidence of DVT in the left lower extremity. Evidence of partially recanalized, chronic, non-obstructive thrombus in the left great SV and its branches consistent with significant reflux consistent with post-phlebitic syndrome. Significant reflux  of the left short saphenous vein.  Marland Kitchen open heart sx  02/17/15  . ORIF HIP FRACTURE  03/20/2012   Procedure: OPEN REDUCTION INTERNAL FIXATION HIP;  Surgeon: Sanjuana Kava, MD;  Location: [facility];  Service: Orthopedics;  Laterality: Right;  . TEE WITHOUT CARDIOVERSION N/A 02/17/2015   Procedure: TRANSESOPHAGEAL ECHOCARDIOGRAM (TEE);  Surgeon: [name], MD;  Location: [facility];  Service: Open Heart Surgery;  Laterality: N/A;  . TRANSTHORACIC ECHOCARDIOGRAM  04/18/2012   EF 45%, mild-moderate LVH  . TRANSTHORACIC ECHOCARDIOGRAM  04/18/12   EF% 45%.SEVERE HYPOKINESIS TO AKINESIS OF THE MID-DISTAL INFEROLATERAL MYOCARDIUM AND MUCH OF THE APEX.    Social History   Social History  . Marital status: Married    Spouse name: N/A  . Number of children: N/A  . Years of education: N/A   Occupational History  . Not on file.   Social History Main Topics  . Smoking status: Never Smoker  .  Smokeless tobacco: Never Used  . Alcohol use No  . Drug use: No  . Sexual activity: Not Currently   Other Topics Concern  . Not on file   Social History Narrative  . No narrative on file     Vitals:   10/24/16 1324  BP: (!) 179/104  Pulse: 80  SpO2: 99%  Weight: 202 lb (91.6 kg)  Height: 5\' 9"  (1.753 m)    PHYSICAL EXAM General: NAD HEENT: Normal. Neck: No JVD, no thyromegaly. Lungs: Clear to auscultation bilaterally with normal respiratory effort. CV: Nondisplaced PMI.  Regular rate and rhythm, normal S1/S2, no S3/S4, 3/6 apical holosystolic murmur. 1+ pitting b/l pretibial edema.  No carotid bruit.   Abdomen: Soft, nontender, no distention.  Neurologic: Alert and oriented.  Psych: Normal affect. Skin: Normal. Musculoskeletal: No gross deformities.    ECG: Most recent ECG reviewed.      ASSESSMENT AND PLAN:  1. Chronic combined systolic and diastolic heart failure: Currently taking Lasix 20 mg bid. Encouraged to resume 40 mg every morning and 29 g every evening as soon as feasible  given his lower extremity edema. I will also aim to control blood pressure. Continue Coreg (will reduce dose to 6.25 mg bid due to fatigue).  2. CAD s/p 5-v CABG: On ASA and Lipitor. Lexiscan Cardiolite indicative of ischemic etiology for EF reduction. Continue Coreg  (will reduce dose to 6.25 mg bid due to fatigue). We previously discussed coronary angiography and he did not want to proceed.  3. Pulmonary embolism: Continue Eliquis.  4. Essential HTN: Elevated. Hydralazine led to a rash. Will add amlodipine 5 mg. Will reduce Coreg dose to 6.25 mg bid due to fatigue.  5. Hyperlipidemia: Continue Lipitor.  6. Tachycardia: Resolved with Coreg.  7. Chronic venous stasis with ulceration: Stable.  Dispo: f/u 6 months  Kate Sable, M.D., F.A.C.C.

## 2016-10-27 ENCOUNTER — Encounter: Payer: PPO | Attending: Internal Medicine | Admitting: Nutrition

## 2016-10-27 VITALS — Wt 202.0 lb

## 2016-10-27 DIAGNOSIS — Z713 Dietary counseling and surveillance: Secondary | ICD-10-CM | POA: Diagnosis not present

## 2016-10-27 DIAGNOSIS — E118 Type 2 diabetes mellitus with unspecified complications: Secondary | ICD-10-CM | POA: Diagnosis not present

## 2016-10-27 DIAGNOSIS — E669 Obesity, unspecified: Secondary | ICD-10-CM

## 2016-10-27 DIAGNOSIS — E119 Type 2 diabetes mellitus without complications: Secondary | ICD-10-CM

## 2016-10-27 NOTE — Patient Instructions (Addendum)
Goals Keep up the good job!! Keep FBS 80-100 mg/dl and less than 200 mg at bedtime. Eat three meals per day Keep drinking water Take Levemir as discussed with DR. Hall. Call if questions. Prevent hypoglycemia.

## 2016-10-27 NOTE — Progress Notes (Signed)
Diabetes Self-Management Education  Visit Type:    Appt. Start Time: 1030 Appt. End Time: 1100 10/27/2016  [name], identified by name and date of birth, is a 83 y.o. male with a diagnosis of Diabetes:  . He is here with Jiles Garter with [facility]. He notes he is doing great. Only taking 8-13 units of Levemir based on his blood sugar readings. Dr. Nevada Crane is aware and agrees with his management thus far with his insulin per pt. And Elana. Lost 10 lbs. Just finished meds for his C Diff and will find out when he can restart taking his fluid pills.  Eating three meals per day. Meals are well balanced. FBS are in the 80-90's most of the time and evening blood sugars before bed 180-190's. Last A1C 6.6%. Will get another one next week at Dr. Juel Burrow office. He is eating well balanced meals and making great progress with healthier foods choices. Cooking meals on crock pot and not eating fast foods or processed foods. Feels much better. No low blood sugars.  ASSESSMENT  Weight 202 lb (91.6 kg). Body mass index is 29.83 kg/m.  B) Eggs, oatmeal with [place] sausage, grapes, apple, coffee and water lemon L) [place] steak, cream potatoes, toss salad and peas, water D) Roast with carrots, potatoes, onions,   Lab Results  Component Value Date   HGBA1C 7.5 (H) 02/07/2015     Individualized Plan for Diabetes Self-Management Training:   Learning Objective:  Patient will have a greater understanding of diabetes self-management. Patient education plan is to attend individual and/or group sessions per assessed needs and concerns.   Plan:   Goals Keep up the good job!! Keep FBS 80-100 mg/dl and less than 200 mg at bedtime. Eat three meals per day Keep drinking water Take Levemir as discussed with DR. Hall. Call if questions.  Prevent hypoglycemia.  Expected Outcomes:     Education material provided: Living Well with Diabetes, Food label handouts, A1C conversion sheet, Meal  plan card and Carbohydrate counting sheet  If problems or questions, patient to contact team via:  Phone and Email  Future DSME appointment:    6 months.

## 2016-11-01 DIAGNOSIS — I10 Essential (primary) hypertension: Secondary | ICD-10-CM | POA: Diagnosis not present

## 2016-11-01 DIAGNOSIS — E1122 Type 2 diabetes mellitus with diabetic chronic kidney disease: Secondary | ICD-10-CM | POA: Diagnosis not present

## 2016-11-02 DIAGNOSIS — E1122 Type 2 diabetes mellitus with diabetic chronic kidney disease: Secondary | ICD-10-CM | POA: Diagnosis not present

## 2016-11-02 DIAGNOSIS — E782 Mixed hyperlipidemia: Secondary | ICD-10-CM | POA: Diagnosis not present

## 2016-11-02 DIAGNOSIS — I519 Heart disease, unspecified: Secondary | ICD-10-CM | POA: Diagnosis not present

## 2016-11-02 DIAGNOSIS — E875 Hyperkalemia: Secondary | ICD-10-CM | POA: Diagnosis not present

## 2016-11-02 DIAGNOSIS — D649 Anemia, unspecified: Secondary | ICD-10-CM | POA: Diagnosis not present

## 2016-11-02 DIAGNOSIS — D72829 Elevated white blood cell count, unspecified: Secondary | ICD-10-CM | POA: Diagnosis not present

## 2016-11-02 DIAGNOSIS — I10 Essential (primary) hypertension: Secondary | ICD-10-CM | POA: Diagnosis not present

## 2016-11-02 DIAGNOSIS — N184 Chronic kidney disease, stage 4 (severe): Secondary | ICD-10-CM | POA: Diagnosis not present

## 2016-11-02 DIAGNOSIS — Z6827 Body mass index (BMI) 27.0-27.9, adult: Secondary | ICD-10-CM | POA: Diagnosis not present

## 2016-11-02 DIAGNOSIS — R809 Proteinuria, unspecified: Secondary | ICD-10-CM | POA: Diagnosis not present

## 2016-11-12 DIAGNOSIS — A0472 Enterocolitis due to Clostridium difficile, not specified as recurrent: Secondary | ICD-10-CM | POA: Diagnosis not present

## 2016-11-12 DIAGNOSIS — Z6826 Body mass index (BMI) 26.0-26.9, adult: Secondary | ICD-10-CM | POA: Diagnosis not present

## 2016-11-12 DIAGNOSIS — L239 Allergic contact dermatitis, unspecified cause: Secondary | ICD-10-CM | POA: Diagnosis not present

## 2016-11-14 ENCOUNTER — Encounter: Payer: Self-pay | Admitting: Internal Medicine

## 2016-11-14 ENCOUNTER — Other Ambulatory Visit: Payer: Self-pay | Admitting: *Deleted

## 2016-11-14 DIAGNOSIS — Z79899 Other long term (current) drug therapy: Secondary | ICD-10-CM | POA: Diagnosis not present

## 2016-11-14 DIAGNOSIS — E559 Vitamin D deficiency, unspecified: Secondary | ICD-10-CM | POA: Diagnosis not present

## 2016-11-14 DIAGNOSIS — R809 Proteinuria, unspecified: Secondary | ICD-10-CM | POA: Diagnosis not present

## 2016-11-14 DIAGNOSIS — N183 Chronic kidney disease, stage 3 (moderate): Secondary | ICD-10-CM | POA: Diagnosis not present

## 2016-11-14 DIAGNOSIS — D509 Iron deficiency anemia, unspecified: Secondary | ICD-10-CM | POA: Diagnosis not present

## 2016-11-14 DIAGNOSIS — I10 Essential (primary) hypertension: Secondary | ICD-10-CM | POA: Diagnosis not present

## 2016-11-14 MED ORDER — APIXABAN 2.5 MG PO TABS: ORAL_TABLET | ORAL | 0 refills | Status: DC

## 2016-11-16 DIAGNOSIS — N2581 Secondary hyperparathyroidism of renal origin: Secondary | ICD-10-CM | POA: Diagnosis not present

## 2016-11-16 DIAGNOSIS — R809 Proteinuria, unspecified: Secondary | ICD-10-CM | POA: Diagnosis not present

## 2016-11-16 DIAGNOSIS — E559 Vitamin D deficiency, unspecified: Secondary | ICD-10-CM | POA: Diagnosis not present

## 2016-11-16 DIAGNOSIS — E875 Hyperkalemia: Secondary | ICD-10-CM | POA: Diagnosis not present

## 2016-11-16 DIAGNOSIS — N183 Chronic kidney disease, stage 3 (moderate): Secondary | ICD-10-CM | POA: Diagnosis not present

## 2016-12-05 ENCOUNTER — Ambulatory Visit: Payer: PPO | Admitting: Nurse Practitioner

## 2016-12-08 DIAGNOSIS — A0471 Enterocolitis due to Clostridium difficile, recurrent: Secondary | ICD-10-CM | POA: Diagnosis not present

## 2016-12-09 DIAGNOSIS — R197 Diarrhea, unspecified: Secondary | ICD-10-CM | POA: Diagnosis not present

## 2016-12-12 DIAGNOSIS — Z79899 Other long term (current) drug therapy: Secondary | ICD-10-CM | POA: Diagnosis not present

## 2016-12-12 DIAGNOSIS — I10 Essential (primary) hypertension: Secondary | ICD-10-CM | POA: Diagnosis not present

## 2016-12-12 DIAGNOSIS — N183 Chronic kidney disease, stage 3 (moderate): Secondary | ICD-10-CM | POA: Diagnosis not present

## 2016-12-12 DIAGNOSIS — R809 Proteinuria, unspecified: Secondary | ICD-10-CM | POA: Diagnosis not present

## 2016-12-13 ENCOUNTER — Other Ambulatory Visit: Payer: Self-pay | Admitting: *Deleted

## 2016-12-13 MED ORDER — APIXABAN 2.5 MG PO TABS: 2.5000 mg | ORAL_TABLET | Freq: Two times a day (BID) | ORAL | 6 refills | Status: DC

## 2016-12-20 ENCOUNTER — Emergency Department (HOSPITAL_COMMUNITY): Payer: PPO

## 2016-12-20 ENCOUNTER — Encounter (HOSPITAL_COMMUNITY): Payer: Self-pay | Admitting: Emergency Medicine

## 2016-12-20 ENCOUNTER — Emergency Department (HOSPITAL_COMMUNITY)
Admission: EM | Admit: 2016-12-20 | Discharge: 2016-12-20 | Disposition: A | Discharge status: Home / Self Care | Payer: PPO | Attending: Emergency Medicine | Admitting: Emergency Medicine

## 2016-12-20 DIAGNOSIS — N184 Chronic kidney disease, stage 4 (severe): Secondary | ICD-10-CM | POA: Diagnosis not present

## 2016-12-20 DIAGNOSIS — Z79899 Other long term (current) drug therapy: Secondary | ICD-10-CM | POA: Diagnosis not present

## 2016-12-20 DIAGNOSIS — E119 Type 2 diabetes mellitus without complications: Secondary | ICD-10-CM | POA: Diagnosis not present

## 2016-12-20 DIAGNOSIS — R600 Localized edema: Secondary | ICD-10-CM | POA: Diagnosis not present

## 2016-12-20 DIAGNOSIS — I509 Heart failure, unspecified: Secondary | ICD-10-CM | POA: Diagnosis not present

## 2016-12-20 DIAGNOSIS — R609 Edema, unspecified: Secondary | ICD-10-CM

## 2016-12-20 DIAGNOSIS — I130 Hypertensive heart and chronic kidney disease with heart failure and stage 1 through stage 4 chronic kidney disease, or unspecified chronic kidney disease: Secondary | ICD-10-CM | POA: Insufficient documentation

## 2016-12-20 DIAGNOSIS — M7989 Other specified soft tissue disorders: Secondary | ICD-10-CM | POA: Diagnosis not present

## 2016-12-20 LAB — CBC WITH DIFFERENTIAL/PLATELET
BASOS PCT: 0 %
Basophils Absolute: 0 10*3/uL (ref 0.0–0.1)
EOS ABS: 0.3 10*3/uL (ref 0.0–0.7)
Eosinophils Relative: 2 %
HCT: 39.4 % (ref 39.0–52.0)
Hemoglobin: 12.8 g/dL — ABNORMAL LOW (ref 13.0–17.0)
LYMPHS ABS: 0.8 10*3/uL (ref 0.7–4.0)
LYMPHS PCT: 5 %
MCH: 29.8 pg (ref 26.0–34.0)
MCHC: 32.5 g/dL (ref 30.0–36.0)
MCV: 91.8 fL (ref 78.0–100.0)
Monocytes Absolute: 0.9 10*3/uL (ref 0.1–1.0)
Monocytes Relative: 6 %
NEUTROS PCT: 87 %
Neutro Abs: 13.1 10*3/uL — ABNORMAL HIGH (ref 1.7–7.7)
PLATELETS: 179 10*3/uL (ref 150–400)
RBC: 4.29 MIL/uL (ref 4.22–5.81)
RDW: 14.4 % (ref 11.5–15.5)
WBC: 15.1 10*3/uL — AB (ref 4.0–10.5)

## 2016-12-20 LAB — COMPREHENSIVE METABOLIC PANEL
ALBUMIN: 3.1 g/dL — AB (ref 3.5–5.0)
ALK PHOS: 119 U/L (ref 38–126)
ALT: 15 U/L — ABNORMAL LOW (ref 17–63)
AST: 18 U/L (ref 15–41)
Anion gap: 6 (ref 5–15)
BUN: 36 mg/dL — ABNORMAL HIGH (ref 6–20)
CALCIUM: 9.5 mg/dL (ref 8.9–10.3)
CHLORIDE: 111 mmol/L (ref 101–111)
CO2: 20 mmol/L — AB (ref 22–32)
Creatinine, Ser: 1.25 mg/dL — ABNORMAL HIGH (ref 0.61–1.24)
GFR calc non Af Amer: 52 mL/min — ABNORMAL LOW (ref >60)
GLUCOSE: 184 mg/dL — AB (ref 65–99)
Potassium: 4.6 mmol/L (ref 3.5–5.1)
SODIUM: 137 mmol/L (ref 135–145)
Total Bilirubin: 1.2 mg/dL (ref 0.3–1.2)
Total Protein: 5.9 g/dL — ABNORMAL LOW (ref 6.5–8.1)

## 2016-12-20 LAB — TROPONIN I: Troponin I: <0.03 ng/mL (ref <0.03)

## 2016-12-20 LAB — BRAIN NATRIURETIC PEPTIDE: B NATRIURETIC PEPTIDE 5: 1957 pg/mL — AB (ref 0.0–100.0)

## 2016-12-20 NOTE — ED Triage Notes (Signed)
Pt currently on abx for c diff and unable to take his lasix. ble and bilateral hand swelling noted. Pt stated to ems that he wanted the fluid drawn off his left foot. A/o. cbg 219

## 2016-12-20 NOTE — ED Notes (Signed)
No sob noted. Pt denies sob.pt states is not taking lasix as prescribed at this time

## 2016-12-20 NOTE — ED Notes (Signed)
Pt returned from xray

## 2016-12-20 NOTE — Discharge Instructions (Signed)
Increase your Lasix back up to the normal dose.

## 2016-12-20 NOTE — ED Provider Notes (Signed)
[facility] DEPT Provider Note   CSN: [id number] Arrival date & time: 12/20/16  1304  By signing my name below, I, Zachary Mcmahon, attest that this documentation has been prepared under the direction and in the presence of [name], MD . Electronically Signed: Gaspar Mcmahon Scribe. 12/20/2016. 1:35 PM  History   Chief Complaint Chief Complaint  Patient presents with  . Leg Swelling   The history is provided by the patient. No language interpreter was used.   HPI Comments: Zachary Mcmahon is a 83 y.o. male with past medical history of CHF who presents to the Emergency Department complaining of persistent left hand and bilateral foot swelling (left > right) that worsened over the last few days. He has associated left foot and ankle pain present with ambulation. He rates his pain as a 5-6/10 currently. He has a history of CHF and has been taking a decreased dose of his diuretic. He notes having lower extremity swelling at baseline but states this is much worse. He denies recent falls. He has been taking an antibiotic recently, possibly metronidazole. He denies diarrhea, CP, SOB, fever, chills.   Past Medical History:  Diagnosis Date  . Arthritis    "legs" (02/07/2015)  . Basal cell carcinoma of forehead 2016 X 2  . Basal cell carcinoma of left earlobe   . CHF (congestive heart failure) ([place])   . Diabetic peripheral neuropathy ([place])    "left foot" (02/07/2015)  . GERD (gastroesophageal reflux disease)   . High potassium   . History of gout X 1  . Hyperlipidemia   . Hypertension   . Old myocardial infarct    "sometime in the past; don't know when" (02/07/2015)  . Pain and swelling of left lower leg    chronic  . Pneumonia X 1  . Renal insufficiency   . Respiratory failure ([place])   . Type II diabetes mellitus ([place])   . Varicose veins     Patient Active Problem List   Diagnosis Date Noted  . C. difficile colitis 08/09/2016  . Acute on chronic combined systolic and diastolic  CHF (congestive heart failure) ([place]) 10/03/2015  . Chronic anticoagulation 10/03/2015  . CKD (chronic kidney disease), stage III 10/03/2015  . Chronic kidney disease (CKD), stage IV (severe) ([place]) 03/05/2015  . Acute pulmonary embolism ([place]) 03/04/2015  . Pulmonary embolism ([place]) 03/03/2015  . 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 Pressure ulcer 02/14/2015  . CHF (congestive heart failure) ([place])   . Renal insufficiency   . Acute respiratory failure with hypoxia ([place]) 02/12/2015  . Arterial hypotension   . Pulmonary edema   . Acute on chronic combined systolic and diastolic HF (heart failure) ([place])   . Abdominal distension   . Acute renal failure superimposed on stage 3 chronic kidney disease ([place])   . NSTEMI (non-ST elevated myocardial infarction) ([place])   . Non-STEMI (non-ST elevated myocardial infarction) ([place]) 02/07/2015  . Unstable angina ([place]) 02/06/2015  . CKD (chronic kidney disease) stage 3, GFR 30-59 ml/min 02/06/2015  . Chest pain 02/06/2015  . Abdominal pain, acute, generalized 02/06/2015  . Diarrhea 02/06/2015  . Swelling of extremity, left, chronic 02/06/2015  . Ileus ([place]) 02/06/2015  . Hyperlipidemia 04/23/2014  . Coronary artery disease 04/23/2014  . Varicose veins of lower extremities with other complications 61/95/0932  . Unspecified hemorrhoids with other complication 67/07/4579  . Intertrochanteric fracture of right hip ([place]) 03/20/2012  . HTN (hypertension), malignant 03/20/2012  . DM type 2 (diabetes mellitus, type 2) (  [place]) 03/20/2012  . Acute renal failure ([place]) 03/20/2012  . Thrombocytopenia ([place]) 03/20/2012  . Hyperkalemia 03/20/2012    Past Surgical History:  Procedure Laterality Date  . BASAL CELL CARCINOMA EXCISION     "probably 1/2 dozen cut off face, left ear" (02/07/2015)  . CARDIAC CATHETERIZATION N/A 02/09/2015   Procedure: Left Heart Cath and Coronary Angiography;  Surgeon: Leonie Man, MD;  Location: [facility];  Service: Cardiovascular;   Laterality: N/A;  . CARDIOVASCULAR STRESS TEST  05/29/2012   Mild-moderate perfusion defect due to infarct/scar with mild-moderate perinfarct ischemia seen in the mid anterior, apical anterior, apical septal, and apical regions. No ECG changes. Global LV systolic function is severely reduced.  Marland Kitchen CATARACT EXTRACTION W/PHACO Left 11/12/2015   Procedure: CATARACT EXTRACTION PHACO AND INTRAOCULAR LENS PLACEMENT LEFT EYE;  Surgeon: Tonny Branch, MD;  Location: [facility];  Service: Ophthalmology;  Laterality: Left;  CDE: 9.82  . CATARACT EXTRACTION W/PHACO Right 12/14/2015   Procedure: CATARACT EXTRACTION PHACO AND INTRAOCULAR LENS PLACEMENT (IOC);  Surgeon: Tonny Branch, MD;  Location: [facility];  Service: Ophthalmology;  Laterality: Right;  CDE: 13.86  . CORONARY ARTERY BYPASS GRAFT N/A 02/17/2015   Procedure: CORONARY ARTERY BYPASS GRAFTING (CABG), ON PUMP, TIMES FIVE, USING LEFT INTERNAL MAMMARY ARTERY, RIGHT GREATER SAPHENOUS VEIN HARVESTED ENDOSCOPICALLY;  Surgeon: [name], MD;  Location: [facility];  Service: Open Heart Surgery;  Laterality: N/A;  -LIMA to LAD -SVG to DIAGONAL - SEQ SVG to OM1 and PLB -SVG to PDA  . CYSTOSCOPY W/ STONE MANIPULATION  X 1  . FRACTURE SURGERY    . KNEE ARTHROSCOPY Right ~ 2008  . LEXISCAM MYOCARDIAL PERFUSION  05/29/12   MARKED PERFUSION DEFECT DUE TO INFARC/SCAR WITH MILD PERINFARCT ISCHEMIA IN THE BASAL INFERIOR, MID INFEROSEPTAL, MID INFERIOR AND APICALINFERIOR REGION. EF%33%. PERIINFARCT ISCHEMIA IN THE MID ANTERIOR, APICAL ANTERIOR, APICAL SEPTAL AND APICAL REGIONS.  . LOWER EXTREMITY VENOUS DOPPLER  07/11/2012   No evidence of DVT in the left lower extremity. Evidence of partially recanalized, chronic, non-obstructive thrombus in the left great SV and its branches consistent with significant reflux consistent with post-phlebitic syndrome. Significant reflux of the left short saphenous vein.  Marland Kitchen open heart sx  02/17/15  . ORIF HIP FRACTURE  03/20/2012   Procedure: OPEN  REDUCTION INTERNAL FIXATION HIP;  Surgeon: Sanjuana Kava, MD;  Location: [facility];  Service: Orthopedics;  Laterality: Right;  . TEE WITHOUT CARDIOVERSION N/A 02/17/2015   Procedure: TRANSESOPHAGEAL ECHOCARDIOGRAM (TEE);  Surgeon: [name], MD;  Location: [facility];  Service: Open Heart Surgery;  Laterality: N/A;  . TRANSTHORACIC ECHOCARDIOGRAM  04/18/2012   EF 45%, mild-moderate LVH  . TRANSTHORACIC ECHOCARDIOGRAM  04/18/12   EF% 45%.SEVERE HYPOKINESIS TO AKINESIS OF THE MID-DISTAL INFEROLATERAL MYOCARDIUM AND MUCH OF THE APEX.     Home Medications    Prior to Admission medications   Medication Sig Start Date End Date Taking? Authorizing Provider  acetaminophen (TYLENOL) 500 MG tablet Take 500 mg by mouth every 6 (six) hours as needed for mild pain.   Yes Historical Provider, MD  amLODipine (NORVASC) 5 MG tablet Take 1 tablet (5 mg total) by mouth daily. 10/24/16  Yes Herminio Commons, MD  apixaban (ELIQUIS) 2.5 MG TABS tablet Take 1 tablet (2.5 mg total) by mouth 2 (two) times daily. 12/13/16  Yes Herminio Commons, MD  atorvastatin (LIPITOR) 10 MG tablet Take 10 mg by mouth daily.   Yes Historical Provider, MD  carvedilol (COREG)  6.25 MG tablet Take 1 tablet (6.25 mg total) by mouth 2 (two) times daily. 10/24/16  Yes Herminio Commons, MD  ferrous sulfate 325 (65 FE) MG tablet Take 1 tablet (325 mg total) by mouth daily with breakfast. 02/24/15  Yes Donielle Liston Alba, PA-C  furosemide (LASIX) 40 MG tablet Take one tab (40mg ) by mouth every morning & 1/2 tab (20mg ) every evening 08/19/16  Yes Velvet Bathe, MD  gabapentin (NEURONTIN) 100 MG capsule Take 100 mg by mouth 3 (three) times daily.  04/01/14  Yes Historical Provider, MD  LEVEMIR FLEXTOUCH 100 UNIT/ML Pen Inject 27 Units into the skin at bedtime. Patient taking differently: Inject 12 Units into the skin at bedtime.  03/05/15  Yes Lezlie Octave Black, NP  traMADol (ULTRAM) 50 MG tablet Take 50 mg by mouth every 6 (six) hours as needed for  moderate pain.   Yes Historical Provider, MD  saccharomyces boulardii (FLORASTOR) 250 MG capsule Take 1 capsule (250 mg total) by mouth 2 (two) times daily. 08/16/16   Velvet Bathe, MD    Family History Family History  Problem Relation Age of Onset  . Deep vein thrombosis Mother   . Hypertension Mother   . Hypertension Sister   . Diabetes Brother   . Hyperlipidemia Brother   . Hypertension Brother   . Heart attack Brother     Social History Social History  Substance Use Topics  . Smoking status: Never Smoker  . Smokeless tobacco: Never Used  . Alcohol use No    Allergies   Bactrim [sulfamethoxazole-trimethoprim] and Hydralazine  Review of Systems Review of Systems  Constitutional: Negative for chills and fever.  Respiratory: Negative for shortness of breath.   Cardiovascular: Positive for leg swelling. Negative for chest pain.  Gastrointestinal: Negative for diarrhea.  Musculoskeletal: Positive for joint swelling and myalgias.  All other systems reviewed and are negative.  Physical Exam Updated Vital Signs BP (!) 163/86   Pulse 77   Temp 98.2 F (36.8 C) (Oral)   Resp 18   SpO2 99%   Physical Exam  Constitutional: He appears well-developed and well-nourished.  HENT:  Head: Normocephalic and atraumatic.  Eyes: Conjunctivae are normal.  Neck: Neck supple. JVD (mild) present.  Cardiovascular: Normal rate, regular rhythm and normal heart sounds.   No murmur heard. Pulmonary/Chest: Effort normal and breath sounds normal. No respiratory distress. He has no wheezes. He has no rales.  Abdominal: Soft. There is no tenderness.  Musculoskeletal: He exhibits edema.  Edema to the left forearm. Moderate to severe edema to the bilateral lower extremities, left > right. Chronic venous skin changes to LLE  Neurological: He is alert.  Awake and appropriate  Skin: Skin is warm and dry. There is erythema.  Psychiatric: He has a normal mood and affect.  Nursing note and vitals  reviewed.   ED Treatments / Results   COORDINATION OF CARE: 1:25 PM Discussed treatment plan with pt at bedside and pt agreed to plan.  Labs (all labs ordered are listed, but only abnormal results are displayed) Labs Reviewed  COMPREHENSIVE METABOLIC PANEL - Abnormal; Notable for the following:       Result Value   CO2 20 (*)    Glucose, Bld 184 (*)    BUN 36 (*)    Creatinine, Ser 1.25 (*)    Total Protein 5.9 (*)    Albumin 3.1 (*)    ALT 15 (*)    GFR calc non Af Amer 52 (*)  All other components within normal limits  CBC WITH DIFFERENTIAL/PLATELET - Abnormal; Notable for the following:    WBC 15.1 (*)    Hemoglobin 12.8 (*)    Neutro Abs 13.1 (*)    All other components within normal limits  BRAIN NATRIURETIC PEPTIDE - Abnormal; Notable for the following:    B Natriuretic Peptide 1,957.0 (*)    All other components within normal limits  TROPONIN I    EKG  EKG Interpretation  Date/Time:  Tuesday Dec 20 2016 13:31:14 EDT Ventricular Rate:  83 PR Interval:    QRS Duration: 93 QT Interval:  386 QTC Calculation: 454 R Axis:   107 Text Interpretation:  Sinus rhythm Probable anterior infarct, age indeterminate Confirmed by Alvino Chapel  MD, Lylie Blacklock [phone]) on 12/20/2016 3:17:11 PM      Radiology Dg Chest 2 View  Result Date: 12/20/2016 CLINICAL DATA:  Edema. EXAM: CHEST  2 VIEW COMPARISON:  May 19, 2016 FINDINGS: Cardiomegaly. There is a small left effusion. The hila and mediastinum are normal. No pulmonary nodules or infiltrates. No overt edema identified. Mild pulmonary venous congestion not excluded. IMPRESSION: Cardiomegaly. Left-sided small pleural effusion. Suggested mild pulmonary venous congestion. Electronically Signed   By: Dorise Bullion III M.D   On: 12/20/2016 13:53    Procedures Procedures (including critical care time)  Medications Ordered in ED Medications - No data to display   Initial Impression / Assessment and Plan / ED Course  I have  reviewed the triage vital signs and the nursing notes.  Pertinent labs & imaging results that were available during my care of the patient were reviewed by me and considered in my medical decision making (see chart for details).     Patient with leg edema. Has been decreasing his Lasix because he been on antibiotics for C. difficile. Told it was not supposed to take as much. He has not had diarrhea in a couple weeks. Will increase Lasix back up. Not hypoxic. Will discharge home with an increased Lasix dose.  Final Clinical Impressions(s) / ED Diagnoses   Final diagnoses:  Peripheral edema    New Prescriptions Discharge Medication List as of 12/20/2016  4:17 PM    I personally performed the services described in this documentation, which was scribed in my presence. The recorded information has been reviewed and is accurate.       [name], MD 12/20/16 2116

## 2016-12-20 NOTE — ED Notes (Signed)
Pt awaiting ride 

## 2016-12-26 ENCOUNTER — Telehealth: Payer: Self-pay | Admitting: *Deleted

## 2016-12-26 DIAGNOSIS — R040 Epistaxis: Secondary | ICD-10-CM

## 2016-12-26 NOTE — Telephone Encounter (Signed)
Patient notified.  Agrees to ENT referral.  Follow up OV scheduled for 01/20/2017 with Dr. Bronson Ing.

## 2016-12-26 NOTE — Telephone Encounter (Signed)
Nosebleeds x 8 days - has had about 12 nose bleeds since then.  Has cut his Eliquis back on his own to one tab every other day & bleeding has slowed down.    Does also c/o swelling on right hand.  - saw pmd 2 weeks ago.  No c/o any other symptoms at this time.    Message fwd to Dr. Harl Bowie as Dr. Bronson Ing is out of the office till Wednesday.

## 2016-12-26 NOTE — Telephone Encounter (Signed)
Received call from community paramedic [facility]) - stating that she usually sees this patient 3 x week.  Stated that he has been c/o nose bleeds & has started taking his Eliquis every over day on his own.  Also, c/o swelling on top of his left hand.  Informed paramedic that patient will be called for further discussion.    Attempted to reach patient - lmtcb.

## 2016-12-26 NOTE — Telephone Encounter (Signed)
Would hold eliquis for now x 4 days then try to resume, needs referral to ENT for nose bleeds. Needs to see Dr Raliegh Ip in 2 weeks to discuss eliquis. Looks like his PE occurred after his CABG surgery in the recovery phase, will need to discuss long term duration anticoag with his primary cardiologist. Keep [place] updated on symptoms  Zandra Abts MD

## 2016-12-27 ENCOUNTER — Telehealth: Payer: Self-pay | Admitting: Cardiovascular Disease

## 2016-12-27 ENCOUNTER — Ambulatory Visit (INDEPENDENT_AMBULATORY_CARE_PROVIDER_SITE_OTHER): Payer: PPO | Admitting: Nurse Practitioner

## 2016-12-27 ENCOUNTER — Encounter: Payer: Self-pay | Admitting: Nurse Practitioner

## 2016-12-27 VITALS — BP 154/78 | HR 78 | Temp 96.8°F | Ht 69.5 in | Wt 181.8 lb

## 2016-12-27 DIAGNOSIS — R197 Diarrhea, unspecified: Secondary | ICD-10-CM | POA: Diagnosis not present

## 2016-12-27 DIAGNOSIS — A498 Other bacterial infections of unspecified site: Secondary | ICD-10-CM

## 2016-12-27 NOTE — Assessment & Plan Note (Addendum)
Per primary care and per patient, recurrent C. difficile infection, initial infection seems to have occurred in January 2018 with positive toxin a and B. He appears to be on his fourth course of vancomycin which is a low dose taper-style treatment. He has 8 days remaining. His diarrhea has resolved, no abdominal pain. No overt symptoms of C. difficile infection. Recommend he continue his antibiotics. Return for follow-up to our office in 4 weeks. Call if he has any worsening or recurrent symptoms. Given his multiple courses of vancomycin, if he does have a C. difficile recurrence he will likely need Dificid obtained through specialty pharmacy. Recommend continue probiotic at this time as well. Call with any questions or concerns.

## 2016-12-27 NOTE — Telephone Encounter (Signed)
Appointment set for 01/05/17 @ 1pm with Dr. Benjamine Mola.  Zachary Mcmahon called and said that he has stopped the Eliquis until Friday.  He wants to discuss the referral appointment and Eliquis.

## 2016-12-27 NOTE — Patient Instructions (Signed)
1. Finish her antibiotics. 2. Continue taking floor store probiotic. 3. Return for follow-up in our office in 4 weeks. 4. Call us if you have any recurrence of your diarrhea/colon infection before then. 5. Call if you have any questions or concerns.

## 2016-12-27 NOTE — Progress Notes (Signed)
cc'ed to pcp °

## 2016-12-27 NOTE — Progress Notes (Signed)
Primary Care Physician:  Celene Squibb, MD Primary Gastroenterologist:  Dr. Gala Romney  Chief Complaint  Patient presents with  . C. Diff    on 4th round of antibiotics  . Diarrhea    better, stool back to normal    HPI:   Zachary Mcmahon is a 83 y.o. male who presents on referral from primary care for recurrent C. difficile infection. PCP notes reviewed. The patient was last seen by primary care on 11/02/2016. The patient stated she finished her most recent round of vancomycin with her last dose last week (6 weeks of therapy thus far) is. Patient stated she had a recurrence of diarrhea yesterday with abdominal cramping and will resume another 4 weeks of therapy. Recommended floor store as well. Referred to GI. Her initial stool test was completed on 09/06/2016 which detected C. difficile toxin a and B.  Today he states he states he's doing well. Is on his fourth round of Vancomycin. Has about 8 days left. Symptoms resolved at this point. Has 8 days remaining on this course. Denies abdominal pain currently, hematochezia, melena, fever, chills. Is having occasional nose bleeds and his physician had him stool Epiquis for about 4 days to see if this helps. Denies chest pain, dyspnea, dizziness, lightheadedness, syncope, near syncope. Denies any other upper or lower GI symptoms.  Past Medical History:  Diagnosis Date  . Arthritis    "legs" (02/07/2015)  . Basal cell carcinoma of forehead 2016 X 2  . Basal cell carcinoma of left earlobe   . CHF (congestive heart failure) ([place])   . Diabetic peripheral neuropathy ([place])    "left foot" (02/07/2015)  . GERD (gastroesophageal reflux disease)   . High potassium   . History of gout X 1  . Hyperlipidemia   . Hypertension   . Old myocardial infarct    "sometime in the past; don't know when" (02/07/2015)  . Pain and swelling of left lower leg    chronic  . Pneumonia X 1  . Renal insufficiency   . Respiratory failure ([place])   . Type II diabetes  mellitus ([place])   . Varicose veins     Past Surgical History:  Procedure Laterality Date  . BASAL CELL CARCINOMA EXCISION     "probably 1/2 dozen cut off face, left ear" (02/07/2015)  . CARDIAC CATHETERIZATION N/A 02/09/2015   Procedure: Left Heart Cath and Coronary Angiography;  Surgeon: Leonie Man, MD;  Location: [facility];  Service: Cardiovascular;  Laterality: N/A;  . CARDIOVASCULAR STRESS TEST  05/29/2012   Mild-moderate perfusion defect due to infarct/scar with mild-moderate perinfarct ischemia seen in the mid anterior, apical anterior, apical septal, and apical regions. No ECG changes. Global LV systolic function is severely reduced.  Marland Kitchen CATARACT EXTRACTION W/PHACO Left 11/12/2015   Procedure: CATARACT EXTRACTION PHACO AND INTRAOCULAR LENS PLACEMENT LEFT EYE;  Surgeon: Tonny Branch, MD;  Location: [facility];  Service: Ophthalmology;  Laterality: Left;  CDE: 9.82  . CATARACT EXTRACTION W/PHACO Right 12/14/2015   Procedure: CATARACT EXTRACTION PHACO AND INTRAOCULAR LENS PLACEMENT (IOC);  Surgeon: Tonny Branch, MD;  Location: [facility];  Service: Ophthalmology;  Laterality: Right;  CDE: 13.86  . CORONARY ARTERY BYPASS GRAFT N/A 02/17/2015   Procedure: CORONARY ARTERY BYPASS GRAFTING (CABG), ON PUMP, TIMES FIVE, USING LEFT INTERNAL MAMMARY ARTERY, RIGHT GREATER SAPHENOUS VEIN HARVESTED ENDOSCOPICALLY;  Surgeon: [name], MD;  Location: [facility];  Service: Open Heart Surgery;  Laterality: N/A;  -LIMA to LAD -  SVG to DIAGONAL - SEQ SVG to OM1 and PLB -SVG to PDA  . CYSTOSCOPY W/ STONE MANIPULATION  X 1  . FRACTURE SURGERY    . KNEE ARTHROSCOPY Right ~ 2008  . LEXISCAM MYOCARDIAL PERFUSION  05/29/12   MARKED PERFUSION DEFECT DUE TO INFARC/SCAR WITH MILD PERINFARCT ISCHEMIA IN THE BASAL INFERIOR, MID INFEROSEPTAL, MID INFERIOR AND APICALINFERIOR REGION. EF%33%. PERIINFARCT ISCHEMIA IN THE MID ANTERIOR, APICAL ANTERIOR, APICAL SEPTAL AND APICAL REGIONS.  . LOWER EXTREMITY VENOUS DOPPLER   07/11/2012   No evidence of DVT in the left lower extremity. Evidence of partially recanalized, chronic, non-obstructive thrombus in the left great SV and its branches consistent with significant reflux consistent with post-phlebitic syndrome. Significant reflux of the left short saphenous vein.  Marland Kitchen open heart sx  02/17/15  . ORIF HIP FRACTURE  03/20/2012   Procedure: OPEN REDUCTION INTERNAL FIXATION HIP;  Surgeon: Sanjuana Kava, MD;  Location: [facility];  Service: Orthopedics;  Laterality: Right;  . TEE WITHOUT CARDIOVERSION N/A 02/17/2015   Procedure: TRANSESOPHAGEAL ECHOCARDIOGRAM (TEE);  Surgeon: [name], MD;  Location: [facility];  Service: Open Heart Surgery;  Laterality: N/A;  . TRANSTHORACIC ECHOCARDIOGRAM  04/18/2012   EF 45%, mild-moderate LVH  . TRANSTHORACIC ECHOCARDIOGRAM  04/18/12   EF% 45%.SEVERE HYPOKINESIS TO AKINESIS OF THE MID-DISTAL INFEROLATERAL MYOCARDIUM AND MUCH OF THE APEX.    Current Outpatient Prescriptions  Medication Sig Dispense Refill  . acetaminophen (TYLENOL) 500 MG tablet Take 500 mg by mouth every 6 (six) hours as needed for mild pain.    Marland Kitchen amLODipine (NORVASC) 5 MG tablet Take 1 tablet (5 mg total) by mouth daily. 30 tablet 6  . apixaban (ELIQUIS) 2.5 MG TABS tablet Take 1 tablet (2.5 mg total) by mouth 2 (two) times daily. 60 tablet 6  . aspirin 81 MG EC tablet Take 1 tablet by mouth daily.  11  . atorvastatin (LIPITOR) 10 MG tablet Take 10 mg by mouth daily.    . carvedilol (COREG) 6.25 MG tablet Take 1 tablet (6.25 mg total) by mouth 2 (two) times daily. 180 tablet 3  . ferrous sulfate 325 (65 FE) MG tablet Take 1 tablet (325 mg total) by mouth daily with breakfast.  3  . furosemide (LASIX) 40 MG tablet Take one tab (40mg ) by mouth every morning & 1/2 tab (20mg ) every evening 30 tablet   . gabapentin (NEURONTIN) 100 MG capsule Take 100 mg by mouth 3 (three) times daily.     Marland Kitchen LEVEMIR FLEXTOUCH 100 UNIT/ML Pen Inject 27 Units into the skin at bedtime.  (Patient taking differently: Inject 12 Units into the skin at bedtime. )    . losartan (COZAAR) 25 MG tablet Take 25 mg by mouth daily.    Marland Kitchen omeprazole (PRILOSEC) 20 MG capsule Take 20 mg by mouth daily.    Marland Kitchen OVER THE [place] extra strength probiotic daily    . PRESCRIPTION MEDICATION Vancomycin 125mg /39ml susp. Take 2.17mls by mouth four times daily    . saccharomyces boulardii (FLORASTOR) 250 MG capsule Take 1 capsule (250 mg total) by mouth 2 (two) times daily. 60 capsule 0  . traMADol (ULTRAM) 50 MG tablet Take 50 mg by mouth every 6 (six) hours as needed for moderate pain.     No current facility-administered medications for this visit.     Allergies as of 12/27/2016 - Review Complete 12/27/2016  Allergen Reaction Noted  . Bactrim [sulfamethoxazole-trimethoprim] Rash 04/11/2014  . Hydralazine Rash 10/24/2016  Family History  Problem Relation Age of Onset  . Deep vein thrombosis Mother   . Hypertension Mother   . Hypertension Sister   . Diabetes Brother   . Hyperlipidemia Brother   . Hypertension Brother   . Heart attack Brother   . Colon cancer Neg Hx     Social History   Social History  . Marital status: Married    Spouse name: N/A  . Number of children: N/A  . Years of education: N/A   Occupational History  . Not on file.   Social History Main Topics  . Smoking status: Never Smoker  . Smokeless tobacco: Never Used  . Alcohol use No  . Drug use: No  . Sexual activity: Not Currently   Other Topics Concern  . Not on file   Social History Narrative  . No narrative on file    Review of Systems: General: Negative for anorexia, weight loss, fever, chills, fatigue, weakness. ENT: Negative for hoarseness, difficulty swallowing. CV: Negative for chest pain, angina, palpitations, peripheral edema.  Respiratory: Negative for dyspnea at rest, cough, sputum, wheezing.  GI: See history of present illness. MS: Noted unsteadiness.  Derm:  Negative for rash or itching.  Endo: Negative for unusual weight change.  Heme: Negative for bruising or bleeding. Allergy: Negative for rash or hives.    Physical Exam: BP (!) 154/78   Pulse 78   Temp (!) 96.8 F (36 C) (Oral)   Ht 5' 9.5" (1.765 m)   Wt 181 lb 12.8 oz (82.5 kg)   BMI 26.46 kg/m  General:   Alert and oriented. Pleasant and cooperative. Well-nourished and well-developed. Ambulates with a cane. Head:  Normocephalic and atraumatic. Eyes:  Without icterus, sclera clear and conjunctiva pink.  Ears:  Normal auditory acuity. Cardiovascular:  S1, S2 present without murmurs appreciated. Extremities without clubbing or edema. Respiratory:  Clear to auscultation bilaterally. No wheezes, rales, or rhonchi. No distress.  Gastrointestinal:  +BS, soft, non-tender and non-distended. No HSM noted. No guarding or rebound. No masses appreciated.  Rectal:  Deferred  Musculoskalatal:  Symmetrical without gross deformities. Neurologic:  Alert and oriented x4;  grossly normal neurologically. Psych:  Alert and cooperative. Normal mood and affect. Heme/Lymph/Immune: No excessive bruising noted.    12/27/2016 11:34 AM   Disclaimer: This note was dictated with voice recognition software. Similar sounding words can inadvertently be transcribed and may not be corrected upon review.

## 2016-12-27 NOTE — Telephone Encounter (Signed)
Returned call to patient.  Stated that he cancelled the appointment with Dr. Benjamine Mola.  Stated that he was better today and wanted to see how he done off the Eliquis before going to see the ENT.  Explained to patient that usually the Eliquis will not cause a spontaneous bleed, but can make an underlying issue worse.  Possibly could have capillaries in nose that may need to be cauterized.  Patient stated that he just did not know.  Stated that he was on his way to see Dr. Lafayette Dragon (GI) now for issue he is having with c-diff.  Also, stated that he was on a fixed income & had to be careful about going to all these doctors on top of paying for medications.  Advised patient that if he changed his mind he could call Dr. Deeann Saint office back to reschedule the appointment when he was ready.  Asked that keep [place] updated on his symptoms.  Follow up scheduled for 01/20/17 with Dr. Bronson Ing.

## 2016-12-28 ENCOUNTER — Other Ambulatory Visit: Payer: Self-pay | Admitting: Pharmacy Technician

## 2016-12-28 NOTE — Patient Outreach (Signed)
[name]) Care Management  12/28/2016  Zachary Mcmahon May 22, 1932 [id number]   Contacted patient in reference to medication adherence for [employer]. Per patient he is taking Atorvastatin and Losartan once daily as prescribed. Patient has been having his doses altered so currently he's getting 30 day supplies until the physician decides on correct dosing. Patient will get 90 day supplies once doses are stablized. He get's his medication's from [facility] which dispenses medication's in blister packs so that helps him with his adherence. Currently his adherence is being affected on the insurance side due to dose changes. I will continue to follow the patient in next few months.  Doreene Burke, [place] [phone]

## 2016-12-30 ENCOUNTER — Telehealth: Payer: Self-pay | Admitting: Cardiovascular Disease

## 2016-12-30 ENCOUNTER — Emergency Department (HOSPITAL_COMMUNITY)
Admission: EM | Admit: 2016-12-30 | Discharge: 2016-12-30 | Disposition: A | Discharge status: Home / Self Care | Payer: PPO | Attending: Emergency Medicine | Admitting: Emergency Medicine

## 2016-12-30 ENCOUNTER — Encounter (HOSPITAL_COMMUNITY): Payer: Self-pay | Admitting: Emergency Medicine

## 2016-12-30 DIAGNOSIS — I2510 Atherosclerotic heart disease of native coronary artery without angina pectoris: Secondary | ICD-10-CM | POA: Insufficient documentation

## 2016-12-30 DIAGNOSIS — Z7982 Long term (current) use of aspirin: Secondary | ICD-10-CM | POA: Diagnosis not present

## 2016-12-30 DIAGNOSIS — I130 Hypertensive heart and chronic kidney disease with heart failure and stage 1 through stage 4 chronic kidney disease, or unspecified chronic kidney disease: Secondary | ICD-10-CM | POA: Insufficient documentation

## 2016-12-30 DIAGNOSIS — R040 Epistaxis: Secondary | ICD-10-CM | POA: Insufficient documentation

## 2016-12-30 DIAGNOSIS — D631 Anemia in chronic kidney disease: Secondary | ICD-10-CM | POA: Diagnosis not present

## 2016-12-30 DIAGNOSIS — Z79899 Other long term (current) drug therapy: Secondary | ICD-10-CM | POA: Insufficient documentation

## 2016-12-30 DIAGNOSIS — N184 Chronic kidney disease, stage 4 (severe): Secondary | ICD-10-CM | POA: Insufficient documentation

## 2016-12-30 DIAGNOSIS — I5043 Acute on chronic combined systolic (congestive) and diastolic (congestive) heart failure: Secondary | ICD-10-CM | POA: Diagnosis not present

## 2016-12-30 DIAGNOSIS — E1122 Type 2 diabetes mellitus with diabetic chronic kidney disease: Secondary | ICD-10-CM | POA: Diagnosis not present

## 2016-12-30 DIAGNOSIS — D649 Anemia, unspecified: Secondary | ICD-10-CM | POA: Diagnosis not present

## 2016-12-30 DIAGNOSIS — E1165 Type 2 diabetes mellitus with hyperglycemia: Secondary | ICD-10-CM | POA: Insufficient documentation

## 2016-12-30 DIAGNOSIS — N289 Disorder of kidney and ureter, unspecified: Secondary | ICD-10-CM | POA: Diagnosis not present

## 2016-12-30 LAB — CBC WITH DIFFERENTIAL/PLATELET
BASOS PCT: 1 %
Basophils Absolute: 0.1 10*3/uL (ref 0.0–0.1)
EOS ABS: 0.2 10*3/uL (ref 0.0–0.7)
EOS PCT: 2 %
HCT: 32.3 % — ABNORMAL LOW (ref 39.0–52.0)
Hemoglobin: 10.5 g/dL — ABNORMAL LOW (ref 13.0–17.0)
LYMPHS ABS: 1 10*3/uL (ref 0.7–4.0)
Lymphocytes Relative: 9 %
MCH: 30.3 pg (ref 26.0–34.0)
MCHC: 32.5 g/dL (ref 30.0–36.0)
MCV: 93.1 fL (ref 78.0–100.0)
MONOS PCT: 10 %
Monocytes Absolute: 1.1 10*3/uL — ABNORMAL HIGH (ref 0.1–1.0)
Neutro Abs: 8.1 10*3/uL — ABNORMAL HIGH (ref 1.7–7.7)
Neutrophils Relative %: 78 %
PLATELETS: 195 10*3/uL (ref 150–400)
RBC: 3.47 MIL/uL — ABNORMAL LOW (ref 4.22–5.81)
RDW: 14.6 % (ref 11.5–15.5)
WBC: 10.4 10*3/uL (ref 4.0–10.5)

## 2016-12-30 LAB — BASIC METABOLIC PANEL
Anion gap: 7 (ref 5–15)
BUN: 39 mg/dL — AB (ref 6–20)
CHLORIDE: 104 mmol/L (ref 101–111)
CO2: 28 mmol/L (ref 22–32)
Calcium: 9.8 mg/dL (ref 8.9–10.3)
Creatinine, Ser: 1.48 mg/dL — ABNORMAL HIGH (ref 0.61–1.24)
GFR calc Af Amer: 49 mL/min — ABNORMAL LOW (ref >60)
GFR, EST NON AFRICAN AMERICAN: 42 mL/min — AB (ref >60)
Glucose, Bld: 140 mg/dL — ABNORMAL HIGH (ref 65–99)
Potassium: 4.1 mmol/L (ref 3.5–5.1)
SODIUM: 139 mmol/L (ref 135–145)

## 2016-12-30 LAB — CBG MONITORING, ED: Glucose-Capillary: 136 mg/dL — ABNORMAL HIGH (ref 65–99)

## 2016-12-30 MED ORDER — SILVER NITRATE-POT NITRATE 75-25 % EX MISC
CUTANEOUS | Status: AC
  Filled 2016-12-30: qty 3

## 2016-12-30 NOTE — Discharge Instructions (Signed)
If your nose rebleeds, sit upright in a chair and pinch her nostrils shut with your fingers for 20 minutes. If he can't get the bleeding to stop, return here. Get saline nasal spray and spray into each nostril every 2 hours while you're awake. Keep your scheduled appointment with your ear nose and throat specialist on 01/05/2017. Get your blood pressure rechecked in a week. Today's was elevated at 184/92.Make sure that you drink at least six 8 ounce glasses of water each day in order to stay well-hydrated.

## 2016-12-30 NOTE — ED Provider Notes (Signed)
[facility] DEPT Provider Note   CSN: [id number] Arrival date & time: 12/30/16  1524     History   Chief Complaint Chief Complaint  Patient presents with  . Epistaxis    HPI Zachary Mcmahon is a 83 y.o. male.  HPI patient has had intermittent nosebleed for the past 10 days and he said is mild bleeding yesterday. He was seen here. Patient was seen here on 12/20/2016 treated and released. He reports that paramedics came to his house and packed his nose 2 days ago. Today he had slight amount of bleeding from both nostrils. Her associated symptoms include mild lightheadedness. Nothing makes symptoms better or worse. No other associated symptoms  Past Medical History:  Diagnosis Date  . Arthritis    "legs" (02/07/2015)  . Basal cell carcinoma of forehead 2016 X 2  . Basal cell carcinoma of left earlobe   . CHF (congestive heart failure) ([place])   . Diabetic peripheral neuropathy ([place])    "left foot" (02/07/2015)  . GERD (gastroesophageal reflux disease)   . High potassium   . History of gout X 1  . Hyperlipidemia   . Hypertension   . Old myocardial infarct    "sometime in the past; don't know when" (02/07/2015)  . Pain and swelling of left lower leg    chronic  . Pneumonia X 1  . Renal insufficiency   . Respiratory failure ([place])   . Type II diabetes mellitus ([place])   . Varicose veins     Patient Active Problem List   Diagnosis Date Noted  . Clostridium difficile infection 08/09/2016  . Acute on chronic combined systolic and diastolic CHF (congestive heart failure) ([place]) 10/03/2015  . Chronic anticoagulation 10/03/2015  . CKD (chronic kidney disease), stage III 10/03/2015  . Chronic kidney disease (CKD), stage IV (severe) ([place]) 03/05/2015  . Acute pulmonary embolism ([place]) 03/04/2015  . Pulmonary embolism ([place]) 03/03/2015  . 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 Pressure ulcer 02/14/2015  . CHF (congestive heart failure) ([place])   . Renal insufficiency   . Acute respiratory failure  with hypoxia ([place]) 02/12/2015  . Arterial hypotension   . Pulmonary edema   . Acute on chronic combined systolic and diastolic HF (heart failure) ([place])   . Abdominal distension   . Acute renal failure superimposed on stage 3 chronic kidney disease ([place])   . NSTEMI (non-ST elevated myocardial infarction) ([place])   . Non-STEMI (non-ST elevated myocardial infarction) ([place]) 02/07/2015  . Unstable angina ([place]) 02/06/2015  . CKD (chronic kidney disease) stage 3, GFR 30-59 ml/min 02/06/2015  . Chest pain 02/06/2015  . Abdominal pain, acute, generalized 02/06/2015  . Diarrhea 02/06/2015  . Swelling of extremity, left, chronic 02/06/2015  . Ileus ([place]) 02/06/2015  . Hyperlipidemia 04/23/2014  . Coronary artery disease 04/23/2014  . Varicose veins of lower extremities with other complications 49/67/5916  . Unspecified hemorrhoids with other complication 38/46/6599  . Intertrochanteric fracture of right hip ([place]) 03/20/2012  . HTN (hypertension), malignant 03/20/2012  . DM type 2 (diabetes mellitus, type 2) ([place]) 03/20/2012  . Acute renal failure ([place]) 03/20/2012  . Thrombocytopenia ([place]) 03/20/2012  . Hyperkalemia 03/20/2012    Past Surgical History:  Procedure Laterality Date  . BASAL CELL CARCINOMA EXCISION     "probably 1/2 dozen cut off face, left ear" (02/07/2015)  . CARDIAC CATHETERIZATION N/A 02/09/2015   Procedure: Left Heart Cath and Coronary Angiography;  Surgeon: Leonie Man, MD;  Location: [facility];  Service:  Cardiovascular;  Laterality: N/A;  . CARDIOVASCULAR STRESS TEST  05/29/2012   Mild-moderate perfusion defect due to infarct/scar with mild-moderate perinfarct ischemia seen in the mid anterior, apical anterior, apical septal, and apical regions. No ECG changes. Global LV systolic function is severely reduced.  Marland Kitchen CATARACT EXTRACTION W/PHACO Left 11/12/2015   Procedure: CATARACT EXTRACTION PHACO AND INTRAOCULAR LENS PLACEMENT LEFT EYE;  Surgeon: Tonny Branch, MD;   Location: [facility];  Service: Ophthalmology;  Laterality: Left;  CDE: 9.82  . CATARACT EXTRACTION W/PHACO Right 12/14/2015   Procedure: CATARACT EXTRACTION PHACO AND INTRAOCULAR LENS PLACEMENT (IOC);  Surgeon: Tonny Branch, MD;  Location: [facility];  Service: Ophthalmology;  Laterality: Right;  CDE: 13.86  . CORONARY ARTERY BYPASS GRAFT N/A 02/17/2015   Procedure: CORONARY ARTERY BYPASS GRAFTING (CABG), ON PUMP, TIMES FIVE, USING LEFT INTERNAL MAMMARY ARTERY, RIGHT GREATER SAPHENOUS VEIN HARVESTED ENDOSCOPICALLY;  Surgeon: [name], MD;  Location: [facility];  Service: Open Heart Surgery;  Laterality: N/A;  -LIMA to LAD -SVG to DIAGONAL - SEQ SVG to OM1 and PLB -SVG to PDA  . CYSTOSCOPY W/ STONE MANIPULATION  X 1  . FRACTURE SURGERY    . KNEE ARTHROSCOPY Right ~ 2008  . LEXISCAM MYOCARDIAL PERFUSION  05/29/12   MARKED PERFUSION DEFECT DUE TO INFARC/SCAR WITH MILD PERINFARCT ISCHEMIA IN THE BASAL INFERIOR, MID INFEROSEPTAL, MID INFERIOR AND APICALINFERIOR REGION. EF%33%. PERIINFARCT ISCHEMIA IN THE MID ANTERIOR, APICAL ANTERIOR, APICAL SEPTAL AND APICAL REGIONS.  . LOWER EXTREMITY VENOUS DOPPLER  07/11/2012   No evidence of DVT in the left lower extremity. Evidence of partially recanalized, chronic, non-obstructive thrombus in the left great SV and its branches consistent with significant reflux consistent with post-phlebitic syndrome. Significant reflux of the left short saphenous vein.  Marland Kitchen open heart sx  02/17/15  . ORIF HIP FRACTURE  03/20/2012   Procedure: OPEN REDUCTION INTERNAL FIXATION HIP;  Surgeon: Sanjuana Kava, MD;  Location: [facility];  Service: Orthopedics;  Laterality: Right;  . TEE WITHOUT CARDIOVERSION N/A 02/17/2015   Procedure: TRANSESOPHAGEAL ECHOCARDIOGRAM (TEE);  Surgeon: [name], MD;  Location: [facility];  Service: Open Heart Surgery;  Laterality: N/A;  . TRANSTHORACIC ECHOCARDIOGRAM  04/18/2012   EF 45%, mild-moderate LVH  . TRANSTHORACIC ECHOCARDIOGRAM  04/18/12   EF% 45%.SEVERE  HYPOKINESIS TO AKINESIS OF THE MID-DISTAL INFEROLATERAL MYOCARDIUM AND MUCH OF THE APEX.       Home Medications    Prior to Admission medications   Medication Sig Start Date End Date Taking? Authorizing Provider  acetaminophen (TYLENOL) 500 MG tablet Take 500 mg by mouth every 6 (six) hours as needed for mild pain.    [provider]  amLODipine (NORVASC) 5 MG tablet Take 1 tablet (5 mg total) by mouth daily. 10/24/16   Herminio Commons, MD  apixaban (ELIQUIS) 2.5 MG TABS tablet Take 1 tablet (2.5 mg total) by mouth 2 (two) times daily. 12/13/16   Herminio Commons, MD  aspirin 81 MG EC tablet Take 1 tablet by mouth daily. 12/22/16   [provider]  atorvastatin (LIPITOR) 10 MG tablet Take 10 mg by mouth daily.    [provider]  carvedilol (COREG) 6.25 MG tablet Take 1 tablet (6.25 mg total) by mouth 2 (two) times daily. 10/24/16   Herminio Commons, MD  ferrous sulfate 325 (65 FE) MG tablet Take 1 tablet (325 mg total) by mouth daily with breakfast. 02/24/15   Nani Skillern, PA-C  furosemide (LASIX) 40 MG tablet Take one  tab (40mg ) by mouth every morning & 1/2 tab (20mg ) every evening 08/19/16   Velvet Bathe, MD  gabapentin (NEURONTIN) 100 MG capsule Take 100 mg by mouth 3 (three) times daily.  04/01/14   [provider]  LEVEMIR FLEXTOUCH 100 UNIT/ML Pen Inject 27 Units into the skin at bedtime. Patient taking differently: Inject 12 Units into the skin at bedtime.  03/05/15   Black, Lezlie Octave, NP  losartan (COZAAR) 25 MG tablet Take 25 mg by mouth daily. 12/22/16   [provider]  omeprazole (PRILOSEC) 20 MG capsule Take 20 mg by mouth daily. 12/23/16   [provider]  Hydro extra strength probiotic daily    [provider]  PRESCRIPTION MEDICATION Vancomycin 125mg /30ml susp. Take 2.36mls by mouth four times daily    [provider]  saccharomyces boulardii (FLORASTOR) 250 MG  capsule Take 1 capsule (250 mg total) by mouth 2 (two) times daily. 08/16/16   Velvet Bathe, MD  traMADol (ULTRAM) 50 MG tablet Take 50 mg by mouth every 6 (six) hours as needed for moderate pain.    [provider]    Family History Family History  Problem Relation Age of Onset  . Deep vein thrombosis Mother   . Hypertension Mother   . Hypertension Sister   . Diabetes Brother   . Hyperlipidemia Brother   . Hypertension Brother   . Heart attack Brother   . Colon cancer Neg Hx     Social History Social History  Substance Use Topics  . Smoking status: Never Smoker  . Smokeless tobacco: Never Used  . Alcohol use No     Allergies   Bactrim [sulfamethoxazole-trimethoprim] and Hydralazine   Review of Systems Review of Systems  Constitutional: Negative.   HENT: Negative.   Respiratory: Negative.   Cardiovascular: Negative.   Gastrointestinal: Negative.   Musculoskeletal: Negative.   Skin: Negative.   Neurological: Positive for light-headedness.  Hematological: Bruises/bleeds easily.  Psychiatric/Behavioral: Negative.      Physical Exam Updated Vital Signs BP (!) 144/79 (BP Location: Right Arm)   Pulse 66   Temp 98.6 F (37 C) (Temporal)   Resp 18   SpO2 98%   Physical Exam  Constitutional: He appears well-developed and well-nourished.  HENT:  Head: Normocephalic and atraumatic.  There is packing deep within the right nostril. No active bleeding  Eyes: Conjunctivae are normal. Pupils are equal, round, and reactive to light.  Neck: Neck supple. No tracheal deviation present. No thyromegaly present.  Cardiovascular: Normal rate.   No murmur heard. Pulmonary/Chest: Effort normal and breath sounds normal.  Abdominal: Soft. Bowel sounds are normal. He exhibits no distension. There is no tenderness.  Musculoskeletal: Normal range of motion. He exhibits no edema or tenderness.  Neurological: He is alert. Coordination normal.  Skin: Skin is warm and dry.  No rash noted.  Psychiatric: He has a normal mood and affect.  Nursing note and vitals reviewed.    ED Treatments / Results  Labs (all labs ordered are listed, but only abnormal results are displayed) Labs Reviewed  CBG MONITORING, ED    EKG  EKG Interpretation None       Radiology No results found.  Procedures Procedures (including critical care time)  Medications Ordered in ED Medications - No data to display  Results for orders placed or performed during the hospital encounter of 12/30/16  CBC with Differential/Platelet  Result Value Ref Range   WBC 10.4 4.0 - 10.5  K/uL   RBC 3.47 (L) 4.22 - 5.81 MIL/uL   Hemoglobin 10.5 (L) 13.0 - 17.0 g/dL   HCT 32.3 (L) 39.0 - 52.0 %   MCV 93.1 78.0 - 100.0 fL   MCH 30.3 26.0 - 34.0 pg   MCHC 32.5 30.0 - 36.0 g/dL   RDW 14.6 11.5 - 15.5 %   Platelets 195 150 - 400 K/uL   Neutrophils Relative % 78 %   Neutro Abs 8.1 (H) 1.7 - 7.7 K/uL   Lymphocytes Relative 9 %   Lymphs Abs 1.0 0.7 - 4.0 K/uL   Monocytes Relative 10 %   Monocytes Absolute 1.1 (H) 0.1 - 1.0 K/uL   Eosinophils Relative 2 %   Eosinophils Absolute 0.2 0.0 - 0.7 K/uL   Basophils Relative 1 %   Basophils Absolute 0.1 0.0 - 0.1 K/uL  Basic metabolic panel  Result Value Ref Range   Sodium 139 135 - 145 mmol/L   Potassium 4.1 3.5 - 5.1 mmol/L   Chloride 104 101 - 111 mmol/L   CO2 28 22 - 32 mmol/L   Glucose, Bld 140 (H) 65 - 99 mg/dL   BUN 39 (H) 6 - 20 mg/dL   Creatinine, Ser 1.48 (H) 0.61 - 1.24 mg/dL   Calcium 9.8 8.9 - 10.3 mg/dL   GFR calc non Af Amer 42 (L) >60 mL/min   GFR calc Af Amer 49 (L) >60 mL/min   Anion gap 7 5 - 15  POC CBG, ED  Result Value Ref Range   Glucose-Capillary 136 (H) 65 - 99 mg/dL   Dg Chest 2 View  Result Date: 12/20/2016 CLINICAL DATA:  Edema. EXAM: CHEST  2 VIEW COMPARISON:  May 19, 2016 FINDINGS: Cardiomegaly. There is a small left effusion. The hila and mediastinum are normal. No pulmonary nodules or infiltrates.  No overt edema identified. Mild pulmonary venous congestion not excluded. IMPRESSION: Cardiomegaly. Left-sided small pleural effusion. Suggested mild pulmonary venous congestion. Electronically Signed   By: Dorise Bullion III M.D   On: 12/20/2016 13:53   Initial Impression / Assessment and Plan / ED Course  I have reviewed the triage vital signs and the nursing notes.  Pertinent labs & imaging results that were available during my care of the patient were reviewed by me and considered in my medical decision making (see chart for details).    I inspected patient's nose with nasal speculum and headlamp. No bleeding site visualized. Packing was removed from right nostril. Patient felt much improved. No bleeding after packing removed. I discussed repacking his nose which he declines Patient had no further episodes of epistaxis. He ambulated around the emergency Department without lightheadedness or further bleeding. He is instructed to get saline nasal spray. Keep scheduled appointment with ENT specialist on 01/05/2017. Saline nasal spray every 2 hours. Blood pressure recheck one week. Current oral hydration Renal insufficiency is chronic  Final Clinical Impressions(s) / ED Diagnoses  Dx #1 epistaxis #2 anemia #3 chronic renal insufficiency #4 hyperglycemia Final diagnoses:  None    New Prescriptions New Prescriptions   No medications on file     Orlie Dakin, MD 12/30/16 1751

## 2016-12-30 NOTE — ED Triage Notes (Signed)
Pt here x 1week ago for uncontrollable nose bleed. States has bled every day since and is getting harder to stop. Pt has gauze in bilateral nostrils at this time. States bleeding from both sides. Pt was off eliquis Monday and Tuesday but still had bleeding. A/o. color wnl

## 2016-12-30 NOTE — Telephone Encounter (Signed)
Caregiver called stating that patient continues to have nosebleeds. They are taking him to  [facility] ER.

## 2016-12-30 NOTE — ED Notes (Signed)
Gauze in nose since 1030 this morning.

## 2016-12-30 NOTE — Telephone Encounter (Signed)
Noted  

## 2017-01-01 DIAGNOSIS — I10 Essential (primary) hypertension: Secondary | ICD-10-CM | POA: Diagnosis not present

## 2017-01-01 DIAGNOSIS — R040 Epistaxis: Secondary | ICD-10-CM | POA: Diagnosis not present

## 2017-01-01 DIAGNOSIS — E119 Type 2 diabetes mellitus without complications: Secondary | ICD-10-CM | POA: Diagnosis not present

## 2017-01-01 DIAGNOSIS — R030 Elevated blood-pressure reading, without diagnosis of hypertension: Secondary | ICD-10-CM | POA: Diagnosis not present

## 2017-01-01 DIAGNOSIS — I519 Heart disease, unspecified: Secondary | ICD-10-CM | POA: Diagnosis not present

## 2017-01-02 ENCOUNTER — Encounter: Payer: Self-pay | Admitting: *Deleted

## 2017-01-02 ENCOUNTER — Telehealth: Payer: Self-pay | Admitting: Cardiovascular Disease

## 2017-01-02 NOTE — Telephone Encounter (Signed)
Patient already has follow up scheduled for 01/20/2017.  Is this soon enough?

## 2017-01-02 NOTE — Telephone Encounter (Signed)
Patient notified.  Stated that he had been back on the Eliquis x 2-3 days prior to this most recent nose bleed.  Stated had bleeding from nose, mouth & left eye.  Saw MD at [facility].  Was told to come back tomorrow for recheck.  Stated they packed his nose.  Took him off of the Eliquis and Aspirin.  Stated that he has not been to see the ENT.  Advised him to remain off the ASA as well for now.  Will request notes from [facility].

## 2017-01-02 NOTE — Telephone Encounter (Signed)
[name] called stating that he went to the ER at [facility] on 01/01/17 for nose bleed. States that he was taken off of Eliquis.  [name] called wanting to know what he needs to do next.

## 2017-01-02 NOTE — Telephone Encounter (Signed)
6/1 is fine. Can stay off Eliquis. Did he see ENT for nosebleeds?

## 2017-01-03 DIAGNOSIS — R040 Epistaxis: Secondary | ICD-10-CM | POA: Diagnosis not present

## 2017-01-03 DIAGNOSIS — Z7901 Long term (current) use of anticoagulants: Secondary | ICD-10-CM | POA: Diagnosis not present

## 2017-01-03 DIAGNOSIS — E7800 Pure hypercholesterolemia, unspecified: Secondary | ICD-10-CM | POA: Diagnosis not present

## 2017-01-03 DIAGNOSIS — Z794 Long term (current) use of insulin: Secondary | ICD-10-CM | POA: Diagnosis not present

## 2017-01-03 DIAGNOSIS — Z4801 Encounter for change or removal of surgical wound dressing: Secondary | ICD-10-CM | POA: Diagnosis not present

## 2017-01-03 DIAGNOSIS — Z4800 Encounter for change or removal of nonsurgical wound dressing: Secondary | ICD-10-CM | POA: Diagnosis not present

## 2017-01-03 DIAGNOSIS — I10 Essential (primary) hypertension: Secondary | ICD-10-CM | POA: Diagnosis not present

## 2017-01-03 DIAGNOSIS — Z79899 Other long term (current) drug therapy: Secondary | ICD-10-CM | POA: Diagnosis not present

## 2017-01-03 DIAGNOSIS — E119 Type 2 diabetes mellitus without complications: Secondary | ICD-10-CM | POA: Diagnosis not present

## 2017-01-09 DIAGNOSIS — Z6825 Body mass index (BMI) 25.0-25.9, adult: Secondary | ICD-10-CM | POA: Diagnosis not present

## 2017-01-09 DIAGNOSIS — R040 Epistaxis: Secondary | ICD-10-CM | POA: Diagnosis not present

## 2017-01-10 ENCOUNTER — Ambulatory Visit (INDEPENDENT_AMBULATORY_CARE_PROVIDER_SITE_OTHER): Payer: PPO | Admitting: Nurse Practitioner

## 2017-01-10 ENCOUNTER — Encounter: Payer: Self-pay | Admitting: Nurse Practitioner

## 2017-01-10 VITALS — BP 123/62 | HR 87 | Temp 98.2°F | Ht 70.0 in | Wt 170.6 lb

## 2017-01-10 DIAGNOSIS — R197 Diarrhea, unspecified: Secondary | ICD-10-CM | POA: Diagnosis not present

## 2017-01-10 DIAGNOSIS — A498 Other bacterial infections of unspecified site: Secondary | ICD-10-CM

## 2017-01-10 DIAGNOSIS — B9689 Other specified bacterial agents as the cause of diseases classified elsewhere: Secondary | ICD-10-CM | POA: Diagnosis not present

## 2017-01-10 NOTE — Assessment & Plan Note (Signed)
The patient completed his most recent 4 week course of vancomycin. About a week after completing therapy he began having more symptoms. Recurrence and C. difficile type symptoms. We will check a C. difficile PCR, toxin A/B, GI pathogen panel, CBC, CMP. I have encouraged him to push fluids as he admits he does not drink enough. Continue probiotic. If he does have a recurrence of C. difficile on laboratory testing he would likely benefit from a course of Dificid. He may end up referred to [facility] for further management for complex, multi recurrent C. difficile infection. Return for follow-up in 4 weeks. Call if any worsening symptoms before then.

## 2017-01-10 NOTE — Progress Notes (Signed)
Referring Provider: Celene Squibb, MD Primary Care Physician:  Celene Squibb, MD Primary GI:  Dr. Gala Romney  Chief Complaint  Patient presents with  . C. Diff    has diarrhea after finishing tx; weak  . Emesis    "green water"  . Weight Loss    lost 12 lbs since 12/27/16    HPI:   Zachary Mcmahon is a 83 y.o. male who presents For follow-up on C. difficile. The patient was last seen in our office 12/27/2016 for the same. At that time it was noted he had been referred by primary care for C. difficile infection at which point he had finished his most recent round of vancomycin with the last dose last week (6 weeks' therapy thus far) and a recurrence of diarrhea the day before his visit with abdominal cramping and PCP recommended resuming another 4 weeks of therapy as well as probiotic. At her last visit he was doing well, on fourth round of vancomycin with 8 days remaining and symptoms resolved at that point. Recommended completing antibiotics, taking probiotic as recommended by primary care, return for follow-up in 4 weeks, call for any worsening symptoms. Given multiple courses of vancomycin, if C. difficile recurrence results he will likely need deficit obtained through specialty pharmacy.  Today he states he's not doing great. Finished his recent antibiotics and began having recurrent diarrhea about a week later. Is still on probiotic. Still losing weight. Noted weakness, fatigue. Having 7-10 loose/watery stools a day. Denies hematochezia. Notes darkened stools. Not on NSAIDs/ASA powders. Denies dyspepsia/GERD symptoms. Most recent Vancomycin course was 4 weeks. Is "not drinking enough" water. Had some vomiting last night just watery; only episode, no recurrence this morning. Denies chest pain, dyspnea, dizziness, lightheadedness, syncope, near syncope. Denies any other upper or lower GI symptoms.  Past Medical History:  Diagnosis Date  . Arthritis    "legs" (02/07/2015)  . Basal cell carcinoma  of forehead 2016 X 2  . Basal cell carcinoma of left earlobe   . CHF (congestive heart failure) ([place])   . Diabetic peripheral neuropathy ([place])    "left foot" (02/07/2015)  . GERD (gastroesophageal reflux disease)   . High potassium   . History of gout X 1  . Hyperlipidemia   . Hypertension   . Old myocardial infarct    "sometime in the past; don't know when" (02/07/2015)  . Pain and swelling of left lower leg    chronic  . Pneumonia X 1  . Renal insufficiency   . Respiratory failure ([place])   . Type II diabetes mellitus ([place])   . Varicose veins     Past Surgical History:  Procedure Laterality Date  . BASAL CELL CARCINOMA EXCISION     "probably 1/2 dozen cut off face, left ear" (02/07/2015)  . CARDIAC CATHETERIZATION N/A 02/09/2015   Procedure: Left Heart Cath and Coronary Angiography;  Surgeon: Leonie Man, MD;  Location: [facility];  Service: Cardiovascular;  Laterality: N/A;  . CARDIOVASCULAR STRESS TEST  05/29/2012   Mild-moderate perfusion defect due to infarct/scar with mild-moderate perinfarct ischemia seen in the mid anterior, apical anterior, apical septal, and apical regions. No ECG changes. Global LV systolic function is severely reduced.  Marland Kitchen CATARACT EXTRACTION W/PHACO Left 11/12/2015   Procedure: CATARACT EXTRACTION PHACO AND INTRAOCULAR LENS PLACEMENT LEFT EYE;  Surgeon: Tonny Branch, MD;  Location: [facility];  Service: Ophthalmology;  Laterality: Left;  CDE: 9.82  . CATARACT EXTRACTION W/PHACO Right  12/14/2015   Procedure: CATARACT EXTRACTION PHACO AND INTRAOCULAR LENS PLACEMENT (IOC);  Surgeon: Tonny Branch, MD;  Location: [facility];  Service: Ophthalmology;  Laterality: Right;  CDE: 13.86  . CORONARY ARTERY BYPASS GRAFT N/A 02/17/2015   Procedure: CORONARY ARTERY BYPASS GRAFTING (CABG), ON PUMP, TIMES FIVE, USING LEFT INTERNAL MAMMARY ARTERY, RIGHT GREATER SAPHENOUS VEIN HARVESTED ENDOSCOPICALLY;  Surgeon: [name], MD;  Location: [facility];  Service: Open Heart Surgery;   Laterality: N/A;  -LIMA to LAD -SVG to DIAGONAL - SEQ SVG to OM1 and PLB -SVG to PDA  . CYSTOSCOPY W/ STONE MANIPULATION  X 1  . FRACTURE SURGERY    . KNEE ARTHROSCOPY Right ~ 2008  . LEXISCAM MYOCARDIAL PERFUSION  05/29/12   MARKED PERFUSION DEFECT DUE TO INFARC/SCAR WITH MILD PERINFARCT ISCHEMIA IN THE BASAL INFERIOR, MID INFEROSEPTAL, MID INFERIOR AND APICALINFERIOR REGION. EF%33%. PERIINFARCT ISCHEMIA IN THE MID ANTERIOR, APICAL ANTERIOR, APICAL SEPTAL AND APICAL REGIONS.  . LOWER EXTREMITY VENOUS DOPPLER  07/11/2012   No evidence of DVT in the left lower extremity. Evidence of partially recanalized, chronic, non-obstructive thrombus in the left great SV and its branches consistent with significant reflux consistent with post-phlebitic syndrome. Significant reflux of the left short saphenous vein.  Marland Kitchen open heart sx  02/17/15  . ORIF HIP FRACTURE  03/20/2012   Procedure: OPEN REDUCTION INTERNAL FIXATION HIP;  Surgeon: Sanjuana Kava, MD;  Location: [facility];  Service: Orthopedics;  Laterality: Right;  . TEE WITHOUT CARDIOVERSION N/A 02/17/2015   Procedure: TRANSESOPHAGEAL ECHOCARDIOGRAM (TEE);  Surgeon: [name], MD;  Location: [facility];  Service: Open Heart Surgery;  Laterality: N/A;  . TRANSTHORACIC ECHOCARDIOGRAM  04/18/2012   EF 45%, mild-moderate LVH  . TRANSTHORACIC ECHOCARDIOGRAM  04/18/12   EF% 45%.SEVERE HYPOKINESIS TO AKINESIS OF THE MID-DISTAL INFEROLATERAL MYOCARDIUM AND MUCH OF THE APEX.    Current Outpatient Prescriptions  Medication Sig Dispense Refill  . acetaminophen (TYLENOL) 500 MG tablet Take 500 mg by mouth every 6 (six) hours as needed for mild pain.    Marland Kitchen amLODipine (NORVASC) 5 MG tablet Take 1 tablet (5 mg total) by mouth daily. 30 tablet 6  . atorvastatin (LIPITOR) 20 MG tablet Take 1 tablet by mouth daily.    . carvedilol (COREG) 6.25 MG tablet Take 1 tablet (6.25 mg total) by mouth 2 (two) times daily. 180 tablet 3  . cholecalciferol (VITAMIN D) 1000 units tablet  Take 1,000 Units by mouth daily.    . ferrous sulfate 325 (65 FE) MG tablet Take 1 tablet (325 mg total) by mouth daily with breakfast.  3  . furosemide (LASIX) 40 MG tablet Take one tab (40mg ) by mouth every morning & 1/2 tab (20mg ) every evening 30 tablet   . gabapentin (NEURONTIN) 100 MG capsule Take 100 mg by mouth 3 (three) times daily.     Marland Kitchen LEVEMIR FLEXTOUCH 100 UNIT/ML Pen Inject 27 Units into the skin at bedtime. (Patient taking differently: Inject 12 Units into the skin at bedtime. )    . losartan (COZAAR) 25 MG tablet Take 25 mg by mouth daily.    Marland Kitchen omeprazole (PRILOSEC) 20 MG capsule Take 20 mg by mouth daily.    Marland Kitchen OVER THE [place] extra strength probiotic daily    . saccharomyces boulardii (FLORASTOR) 250 MG capsule Take 1 capsule (250 mg total) by mouth 2 (two) times daily. 60 capsule 0  . traMADol (ULTRAM) 50 MG tablet Take 50 mg by mouth every 6 (six) hours  as needed for moderate pain.     No current facility-administered medications for this visit.     Allergies as of 01/10/2017 - Review Complete 01/10/2017  Allergen Reaction Noted  . Bactrim [sulfamethoxazole-trimethoprim] Rash 04/11/2014  . Hydralazine Rash 10/24/2016    Family History  Problem Relation Age of Onset  . Deep vein thrombosis Mother   . Hypertension Mother   . Hypertension Sister   . Diabetes Brother   . Hyperlipidemia Brother   . Hypertension Brother   . Heart attack Brother   . Colon cancer Neg Hx     Social History   Social History  . Marital status: Married    Spouse name: N/A  . Number of children: N/A  . Years of education: N/A   Social History Main Topics  . Smoking status: Never Smoker  . Smokeless tobacco: Never Used  . Alcohol use No  . Drug use: No  . Sexual activity: Not Currently   Other Topics Concern  . None   Social History Narrative  . None    Review of Systems: General: Negative for anorexia, weight loss, fever, chills. Eyes: Negative  for vision changes.  ENT: Negative for hoarseness, difficulty swallowing. CV: Negative for chest pain, angina, palpitations, peripheral edema.  Respiratory: Negative for dyspnea at rest, cough, sputum, wheezing.  GI: See history of present illness. Endo: Negative for unusual weight change.  Heme: Negative for bruising or bleeding. Allergy: Negative for rash or hives.   Physical Exam: BP 123/62   Pulse 87   Temp 98.2 F (36.8 C) (Oral)   Ht 5\' 10"  (1.778 m)   Wt 170 lb 9.6 oz (77.4 kg)   BMI 24.48 kg/m  General:   Alert and oriented. Pleasant and cooperative. Well-nourished and well-developed.  Eyes:  Without icterus, sclera clear and conjunctiva pink.  Ears:  Normal auditory acuity. Cardiovascular:  S1, S2 present without murmurs appreciated. Extremities without clubbing or edema. Respiratory:  Clear to auscultation bilaterally. No wheezes, rales, or rhonchi. No distress.  Gastrointestinal:  +BS, soft, non-tender and non-distended. No HSM noted. No guarding or rebound. No masses appreciated.  Rectal:  Deferred  Musculoskalatal:  Symmetrical without gross deformities. Neurologic:  Alert and oriented x4;  grossly normal neurologically. Psych:  Alert and cooperative. Normal mood and affect. Heme/Lymph/Immune: No excessive bruising noted.    01/10/2017 11:42 AM   Disclaimer: This note was dictated with voice recognition software. Similar sounding words can inadvertently be transcribed and may not be corrected upon review.

## 2017-01-10 NOTE — Progress Notes (Signed)
CC'ED TO PCP 

## 2017-01-10 NOTE — Assessment & Plan Note (Signed)
The patient is having recurrent diarrhea which started approximately 1 week after completed his and most recent course of vancomycin. He is currently on a probiotic. I have advised against any further vancomycin at this time. We will check for C. difficile as per above and he may need a course of Dificid and possible referral to [facility] if he has yet another recurrence of C. difficile. His diarrhea could be caused by other etiologies such as postinfectious IBS, microscopic colitis, or others. He is agreeable to a colonoscopy if it is needed if his C. difficile is negative. Recommend he return for follow-up in 4 weeks to further evaluate.

## 2017-01-10 NOTE — Patient Instructions (Signed)
1. Keep taking your probiotic (Florastor) 2. Bring her stool test to the lab. They need to be liquid stools for the test to be able to be run 3. When you bring your stool tests to the lab he can have your blood test done at that time. 4. I recommend you drink plenty of fluids stay hydrated. We will call you with the results of your testing. 5. Return for follow-up in 4 weeks.

## 2017-01-18 DIAGNOSIS — R531 Weakness: Secondary | ICD-10-CM | POA: Diagnosis not present

## 2017-01-18 DIAGNOSIS — I5040 Unspecified combined systolic (congestive) and diastolic (congestive) heart failure: Secondary | ICD-10-CM | POA: Diagnosis not present

## 2017-01-18 DIAGNOSIS — K921 Melena: Secondary | ICD-10-CM | POA: Diagnosis not present

## 2017-01-18 DIAGNOSIS — D72829 Elevated white blood cell count, unspecified: Secondary | ICD-10-CM | POA: Diagnosis not present

## 2017-01-18 DIAGNOSIS — Z794 Long term (current) use of insulin: Secondary | ICD-10-CM | POA: Diagnosis not present

## 2017-01-18 DIAGNOSIS — E860 Dehydration: Secondary | ICD-10-CM | POA: Diagnosis not present

## 2017-01-18 DIAGNOSIS — K219 Gastro-esophageal reflux disease without esophagitis: Secondary | ICD-10-CM | POA: Diagnosis not present

## 2017-01-18 DIAGNOSIS — Z79899 Other long term (current) drug therapy: Secondary | ICD-10-CM | POA: Diagnosis not present

## 2017-01-18 DIAGNOSIS — I130 Hypertensive heart and chronic kidney disease with heart failure and stage 1 through stage 4 chronic kidney disease, or unspecified chronic kidney disease: Secondary | ICD-10-CM | POA: Diagnosis not present

## 2017-01-18 DIAGNOSIS — R627 Adult failure to thrive: Secondary | ICD-10-CM | POA: Diagnosis not present

## 2017-01-18 DIAGNOSIS — Z951 Presence of aortocoronary bypass graft: Secondary | ICD-10-CM | POA: Diagnosis not present

## 2017-01-18 DIAGNOSIS — E785 Hyperlipidemia, unspecified: Secondary | ICD-10-CM | POA: Diagnosis not present

## 2017-01-18 DIAGNOSIS — R634 Abnormal weight loss: Secondary | ICD-10-CM | POA: Diagnosis not present

## 2017-01-18 DIAGNOSIS — E559 Vitamin D deficiency, unspecified: Secondary | ICD-10-CM | POA: Diagnosis not present

## 2017-01-18 DIAGNOSIS — R011 Cardiac murmur, unspecified: Secondary | ICD-10-CM | POA: Diagnosis not present

## 2017-01-18 DIAGNOSIS — R000 Tachycardia, unspecified: Secondary | ICD-10-CM | POA: Diagnosis not present

## 2017-01-18 DIAGNOSIS — E8809 Other disorders of plasma-protein metabolism, not elsewhere classified: Secondary | ICD-10-CM | POA: Diagnosis not present

## 2017-01-18 DIAGNOSIS — Z6826 Body mass index (BMI) 26.0-26.9, adult: Secondary | ICD-10-CM | POA: Diagnosis not present

## 2017-01-18 DIAGNOSIS — A0471 Enterocolitis due to Clostridium difficile, recurrent: Secondary | ICD-10-CM | POA: Diagnosis not present

## 2017-01-18 DIAGNOSIS — Z8619 Personal history of other infectious and parasitic diseases: Secondary | ICD-10-CM | POA: Diagnosis not present

## 2017-01-18 DIAGNOSIS — K5900 Constipation, unspecified: Secondary | ICD-10-CM | POA: Diagnosis not present

## 2017-01-18 DIAGNOSIS — R9431 Abnormal electrocardiogram [ECG] [EKG]: Secondary | ICD-10-CM | POA: Diagnosis not present

## 2017-01-18 DIAGNOSIS — J449 Chronic obstructive pulmonary disease, unspecified: Secondary | ICD-10-CM | POA: Diagnosis not present

## 2017-01-18 DIAGNOSIS — R197 Diarrhea, unspecified: Secondary | ICD-10-CM | POA: Diagnosis not present

## 2017-01-18 DIAGNOSIS — I252 Old myocardial infarction: Secondary | ICD-10-CM | POA: Diagnosis not present

## 2017-01-18 DIAGNOSIS — E46 Unspecified protein-calorie malnutrition: Secondary | ICD-10-CM | POA: Diagnosis not present

## 2017-01-18 DIAGNOSIS — I8002 Phlebitis and thrombophlebitis of superficial vessels of left lower extremity: Secondary | ICD-10-CM | POA: Diagnosis not present

## 2017-01-18 DIAGNOSIS — E162 Hypoglycemia, unspecified: Secondary | ICD-10-CM | POA: Diagnosis not present

## 2017-01-18 DIAGNOSIS — R109 Unspecified abdominal pain: Secondary | ICD-10-CM | POA: Diagnosis not present

## 2017-01-18 DIAGNOSIS — R5383 Other fatigue: Secondary | ICD-10-CM | POA: Diagnosis not present

## 2017-01-18 DIAGNOSIS — Z66 Do not resuscitate: Secondary | ICD-10-CM | POA: Diagnosis not present

## 2017-01-18 DIAGNOSIS — N183 Chronic kidney disease, stage 3 (moderate): Secondary | ICD-10-CM | POA: Diagnosis not present

## 2017-01-18 DIAGNOSIS — K529 Noninfective gastroenteritis and colitis, unspecified: Secondary | ICD-10-CM | POA: Diagnosis not present

## 2017-01-18 DIAGNOSIS — E1122 Type 2 diabetes mellitus with diabetic chronic kidney disease: Secondary | ICD-10-CM | POA: Diagnosis not present

## 2017-01-18 DIAGNOSIS — D72828 Other elevated white blood cell count: Secondary | ICD-10-CM | POA: Diagnosis not present

## 2017-01-18 DIAGNOSIS — I2510 Atherosclerotic heart disease of native coronary artery without angina pectoris: Secondary | ICD-10-CM | POA: Diagnosis not present

## 2017-01-19 DIAGNOSIS — E860 Dehydration: Secondary | ICD-10-CM | POA: Diagnosis not present

## 2017-01-19 DIAGNOSIS — I2510 Atherosclerotic heart disease of native coronary artery without angina pectoris: Secondary | ICD-10-CM | POA: Diagnosis not present

## 2017-01-19 DIAGNOSIS — K219 Gastro-esophageal reflux disease without esophagitis: Secondary | ICD-10-CM | POA: Diagnosis not present

## 2017-01-19 DIAGNOSIS — K921 Melena: Secondary | ICD-10-CM | POA: Diagnosis not present

## 2017-01-19 DIAGNOSIS — I130 Hypertensive heart and chronic kidney disease with heart failure and stage 1 through stage 4 chronic kidney disease, or unspecified chronic kidney disease: Secondary | ICD-10-CM | POA: Diagnosis not present

## 2017-01-19 DIAGNOSIS — I5040 Unspecified combined systolic (congestive) and diastolic (congestive) heart failure: Secondary | ICD-10-CM | POA: Diagnosis not present

## 2017-01-19 DIAGNOSIS — R000 Tachycardia, unspecified: Secondary | ICD-10-CM | POA: Diagnosis not present

## 2017-01-19 DIAGNOSIS — Z66 Do not resuscitate: Secondary | ICD-10-CM | POA: Diagnosis not present

## 2017-01-19 DIAGNOSIS — I252 Old myocardial infarction: Secondary | ICD-10-CM | POA: Diagnosis not present

## 2017-01-19 DIAGNOSIS — Z794 Long term (current) use of insulin: Secondary | ICD-10-CM | POA: Diagnosis not present

## 2017-01-19 DIAGNOSIS — E46 Unspecified protein-calorie malnutrition: Secondary | ICD-10-CM | POA: Diagnosis not present

## 2017-01-19 DIAGNOSIS — E559 Vitamin D deficiency, unspecified: Secondary | ICD-10-CM | POA: Diagnosis not present

## 2017-01-19 DIAGNOSIS — Z951 Presence of aortocoronary bypass graft: Secondary | ICD-10-CM | POA: Diagnosis not present

## 2017-01-19 DIAGNOSIS — E162 Hypoglycemia, unspecified: Secondary | ICD-10-CM | POA: Diagnosis not present

## 2017-01-19 DIAGNOSIS — Z6826 Body mass index (BMI) 26.0-26.9, adult: Secondary | ICD-10-CM | POA: Diagnosis not present

## 2017-01-19 DIAGNOSIS — R9431 Abnormal electrocardiogram [ECG] [EKG]: Secondary | ICD-10-CM | POA: Diagnosis not present

## 2017-01-19 DIAGNOSIS — R011 Cardiac murmur, unspecified: Secondary | ICD-10-CM | POA: Diagnosis not present

## 2017-01-19 DIAGNOSIS — A0471 Enterocolitis due to Clostridium difficile, recurrent: Secondary | ICD-10-CM | POA: Diagnosis not present

## 2017-01-19 DIAGNOSIS — R531 Weakness: Secondary | ICD-10-CM | POA: Diagnosis not present

## 2017-01-19 DIAGNOSIS — J449 Chronic obstructive pulmonary disease, unspecified: Secondary | ICD-10-CM | POA: Diagnosis not present

## 2017-01-19 DIAGNOSIS — Z79899 Other long term (current) drug therapy: Secondary | ICD-10-CM | POA: Diagnosis not present

## 2017-01-19 DIAGNOSIS — E1122 Type 2 diabetes mellitus with diabetic chronic kidney disease: Secondary | ICD-10-CM | POA: Diagnosis not present

## 2017-01-19 DIAGNOSIS — N183 Chronic kidney disease, stage 3 (moderate): Secondary | ICD-10-CM | POA: Diagnosis not present

## 2017-01-19 DIAGNOSIS — E785 Hyperlipidemia, unspecified: Secondary | ICD-10-CM | POA: Diagnosis not present

## 2017-01-19 DIAGNOSIS — R627 Adult failure to thrive: Secondary | ICD-10-CM | POA: Diagnosis not present

## 2017-01-19 DIAGNOSIS — K529 Noninfective gastroenteritis and colitis, unspecified: Secondary | ICD-10-CM | POA: Diagnosis not present

## 2017-01-20 ENCOUNTER — Ambulatory Visit: Payer: PPO | Admitting: Cardiovascular Disease

## 2017-01-20 DIAGNOSIS — I8002 Phlebitis and thrombophlebitis of superficial vessels of left lower extremity: Secondary | ICD-10-CM | POA: Diagnosis not present

## 2017-01-20 DIAGNOSIS — E8809 Other disorders of plasma-protein metabolism, not elsewhere classified: Secondary | ICD-10-CM | POA: Diagnosis not present

## 2017-01-20 DIAGNOSIS — R634 Abnormal weight loss: Secondary | ICD-10-CM | POA: Diagnosis not present

## 2017-01-20 DIAGNOSIS — A0471 Enterocolitis due to Clostridium difficile, recurrent: Secondary | ICD-10-CM | POA: Diagnosis not present

## 2017-01-20 DIAGNOSIS — D72829 Elevated white blood cell count, unspecified: Secondary | ICD-10-CM | POA: Diagnosis not present

## 2017-01-20 DIAGNOSIS — E46 Unspecified protein-calorie malnutrition: Secondary | ICD-10-CM | POA: Diagnosis not present

## 2017-01-20 DIAGNOSIS — R627 Adult failure to thrive: Secondary | ICD-10-CM | POA: Diagnosis not present

## 2017-01-21 DIAGNOSIS — R634 Abnormal weight loss: Secondary | ICD-10-CM | POA: Diagnosis not present

## 2017-01-21 DIAGNOSIS — E46 Unspecified protein-calorie malnutrition: Secondary | ICD-10-CM | POA: Diagnosis not present

## 2017-01-21 DIAGNOSIS — R627 Adult failure to thrive: Secondary | ICD-10-CM | POA: Diagnosis not present

## 2017-01-21 DIAGNOSIS — K5900 Constipation, unspecified: Secondary | ICD-10-CM | POA: Diagnosis not present

## 2017-01-22 DIAGNOSIS — K5900 Constipation, unspecified: Secondary | ICD-10-CM | POA: Diagnosis not present

## 2017-01-22 DIAGNOSIS — R634 Abnormal weight loss: Secondary | ICD-10-CM | POA: Diagnosis not present

## 2017-01-22 DIAGNOSIS — R627 Adult failure to thrive: Secondary | ICD-10-CM | POA: Diagnosis not present

## 2017-01-22 DIAGNOSIS — E46 Unspecified protein-calorie malnutrition: Secondary | ICD-10-CM | POA: Diagnosis not present

## 2017-01-23 DIAGNOSIS — R634 Abnormal weight loss: Secondary | ICD-10-CM | POA: Diagnosis not present

## 2017-01-23 DIAGNOSIS — R627 Adult failure to thrive: Secondary | ICD-10-CM | POA: Diagnosis not present

## 2017-01-23 DIAGNOSIS — A0471 Enterocolitis due to Clostridium difficile, recurrent: Secondary | ICD-10-CM | POA: Diagnosis not present

## 2017-01-23 DIAGNOSIS — R197 Diarrhea, unspecified: Secondary | ICD-10-CM | POA: Diagnosis not present

## 2017-01-25 DIAGNOSIS — I2510 Atherosclerotic heart disease of native coronary artery without angina pectoris: Secondary | ICD-10-CM | POA: Diagnosis not present

## 2017-01-25 DIAGNOSIS — E785 Hyperlipidemia, unspecified: Secondary | ICD-10-CM | POA: Diagnosis not present

## 2017-01-25 DIAGNOSIS — E46 Unspecified protein-calorie malnutrition: Secondary | ICD-10-CM | POA: Diagnosis not present

## 2017-01-25 DIAGNOSIS — E119 Type 2 diabetes mellitus without complications: Secondary | ICD-10-CM | POA: Diagnosis not present

## 2017-01-25 DIAGNOSIS — I10 Essential (primary) hypertension: Secondary | ICD-10-CM | POA: Diagnosis not present

## 2017-01-25 DIAGNOSIS — M6281 Muscle weakness (generalized): Secondary | ICD-10-CM | POA: Diagnosis not present

## 2017-01-25 DIAGNOSIS — J449 Chronic obstructive pulmonary disease, unspecified: Secondary | ICD-10-CM | POA: Diagnosis not present

## 2017-01-25 DIAGNOSIS — Z79891 Long term (current) use of opiate analgesic: Secondary | ICD-10-CM | POA: Diagnosis not present

## 2017-01-25 DIAGNOSIS — I509 Heart failure, unspecified: Secondary | ICD-10-CM | POA: Diagnosis not present

## 2017-01-25 DIAGNOSIS — N259 Disorder resulting from impaired renal tubular function, unspecified: Secondary | ICD-10-CM | POA: Diagnosis not present

## 2017-01-25 DIAGNOSIS — Z794 Long term (current) use of insulin: Secondary | ICD-10-CM | POA: Diagnosis not present

## 2017-01-25 DIAGNOSIS — A0471 Enterocolitis due to Clostridium difficile, recurrent: Secondary | ICD-10-CM | POA: Diagnosis not present

## 2017-01-26 ENCOUNTER — Ambulatory Visit: Payer: PPO | Admitting: Nurse Practitioner

## 2017-02-02 DIAGNOSIS — A0472 Enterocolitis due to Clostridium difficile, not specified as recurrent: Secondary | ICD-10-CM | POA: Diagnosis not present

## 2017-02-03 IMAGING — CR DG CHEST 1V PORT
1 series · 1 of 1 positions shown · non-contrast
Comparison: 03/20/2012

CLINICAL DATA: Chest pain radiating to both arms beginning today

EXAM:
PORTABLE CHEST - 1 VIEW

[ap portable]
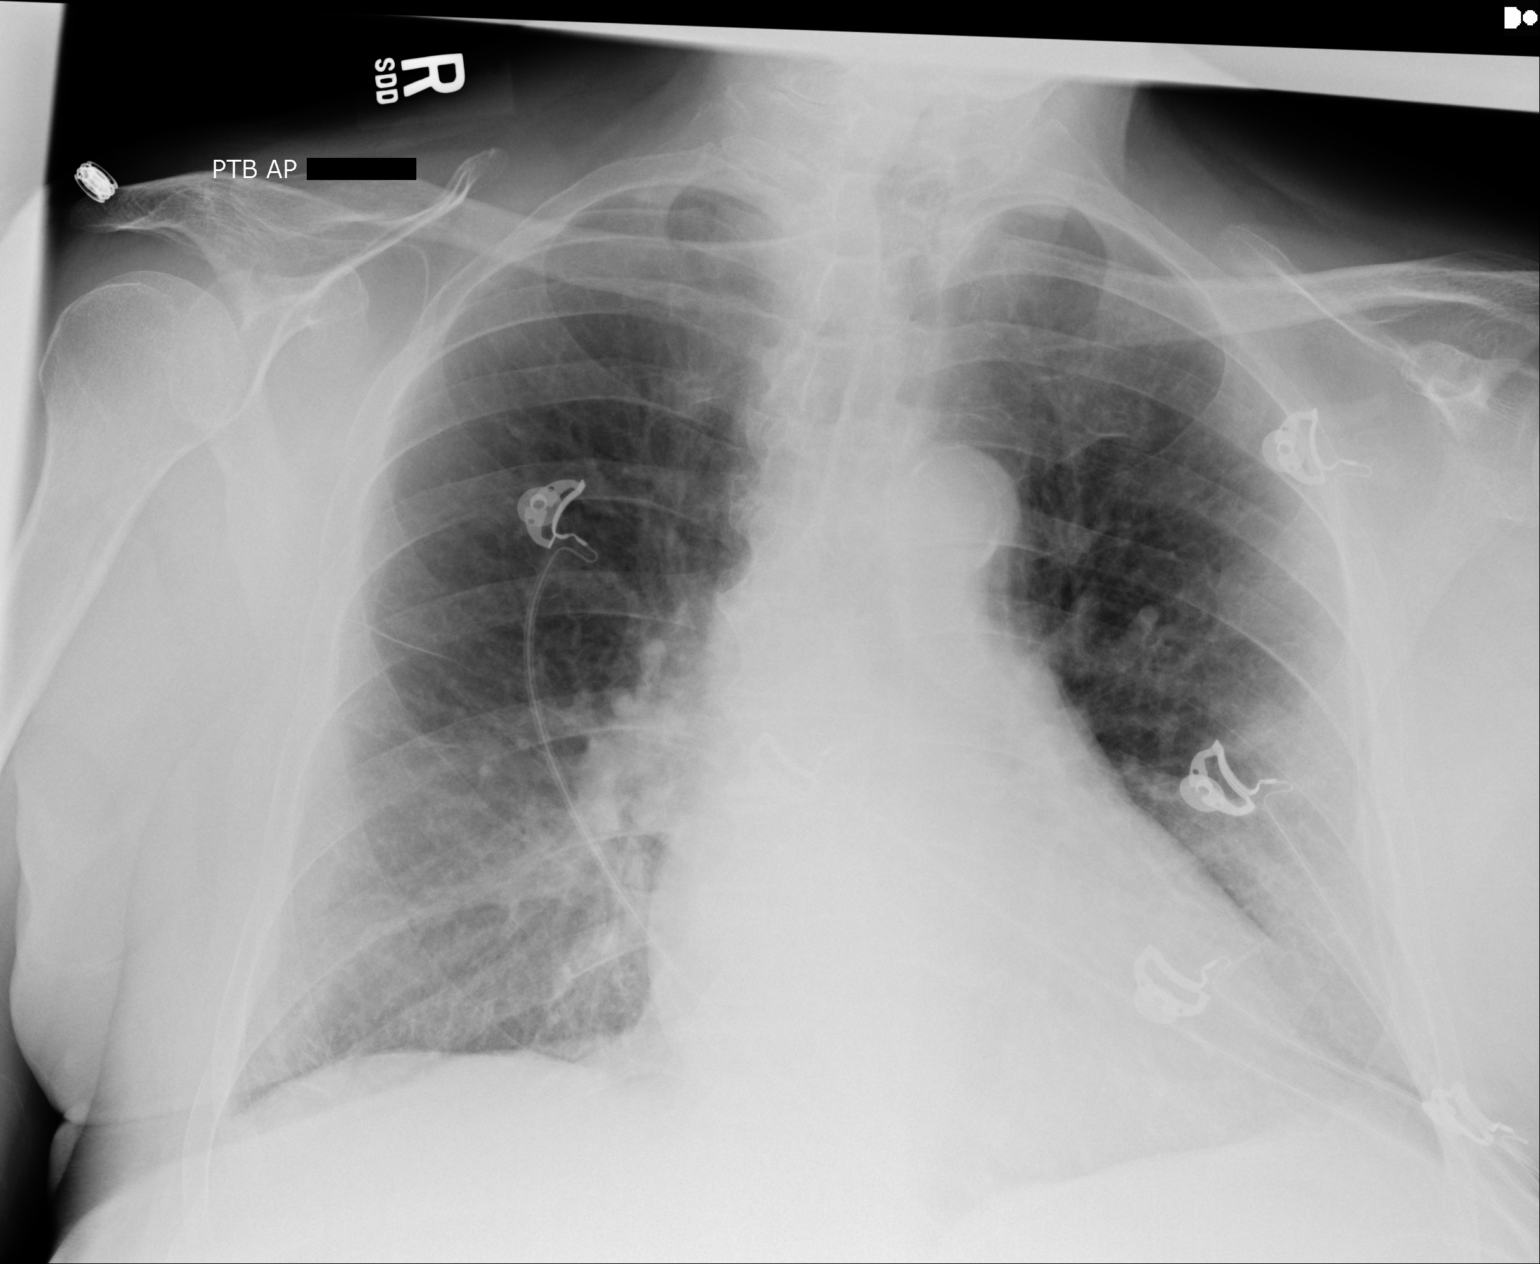

[1 of 1 positions shown; findings below may reference images not displayed]

FINDINGS: Mild enlargement of the cardiomediastinal silhouette with central
vascular congestion reidentified. A few interstitial Kerley B-lines
are noted at the lung bases which could indicate interstitial edema.
No focal pulmonary opacity allowing for technique. No pleural
effusion. No acute osseous finding.
IMPRESSION: Mild enlargement of the cardiomediastinal silhouette with suggestion
of early interstitial edema. If symptoms persist, consider PA and
lateral chest radiographs obtained at full inspiration when the
patient is clinically able.

## 2017-02-03 IMAGING — DX DG ABDOMEN 2V
3 series · 3 of 3 positions shown · non-contrast
Comparison: Portable chest radiograph 2018 hr today. Lumbar MRI
10/21/2009.

CLINICAL DATA: 82-year-old male with generalized abdominal pain and
chest pain radiating to the arms since 8220 hrs. Initial encounter.

EXAM:
ABDOMEN - 2 VIEW

[abdomen erect]
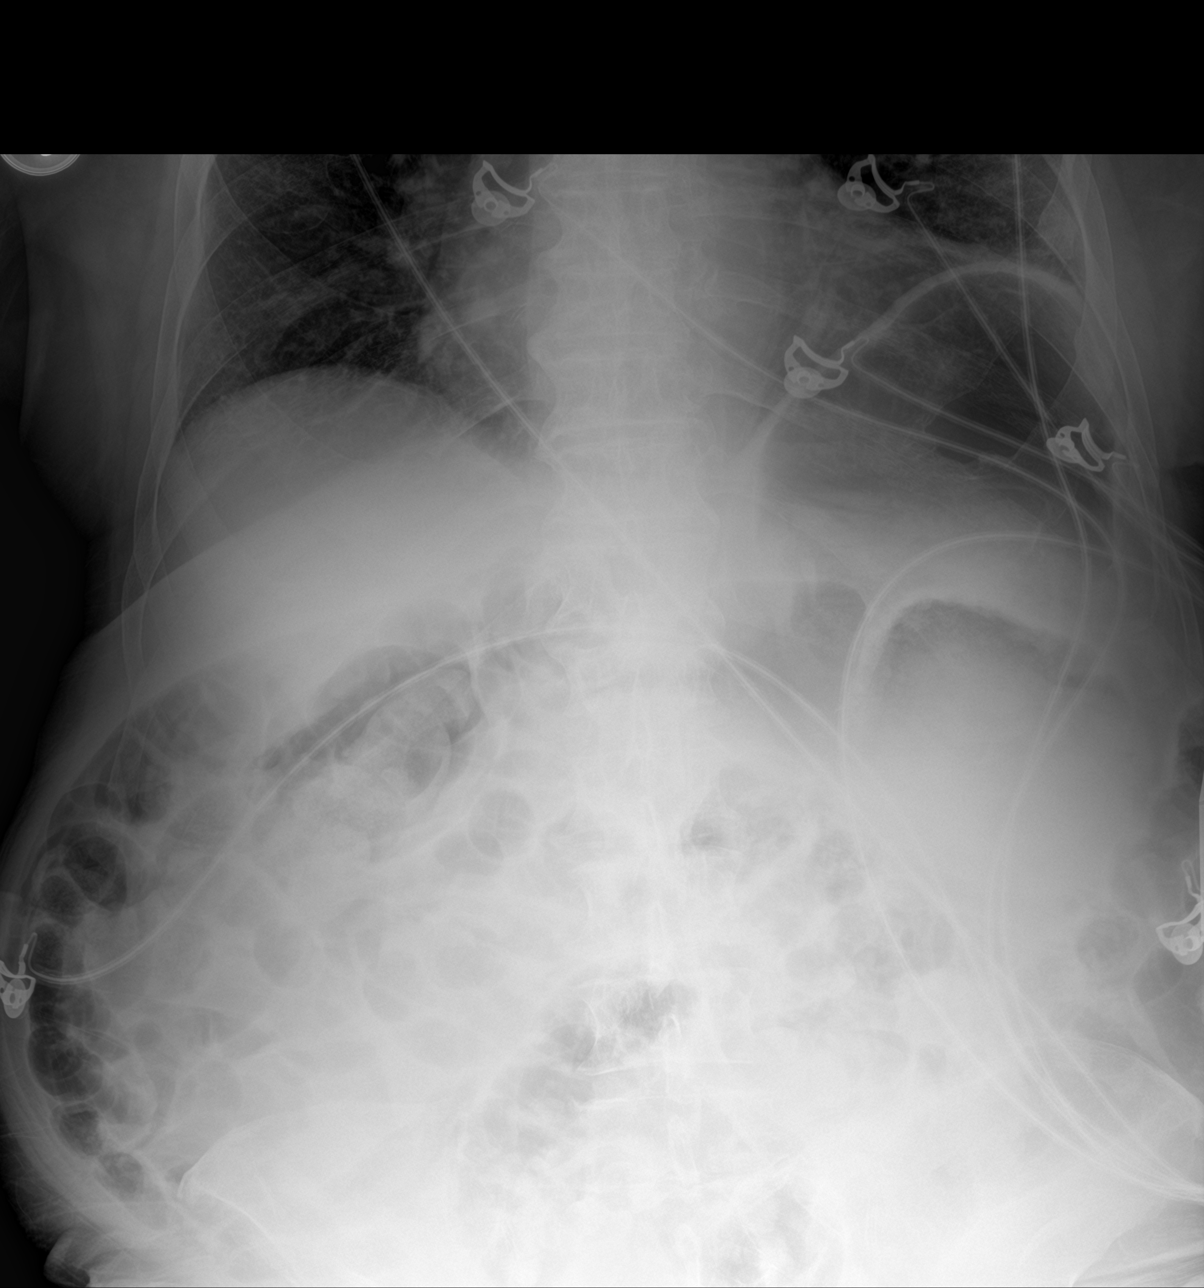

[abdomen supine (1 of 2)]
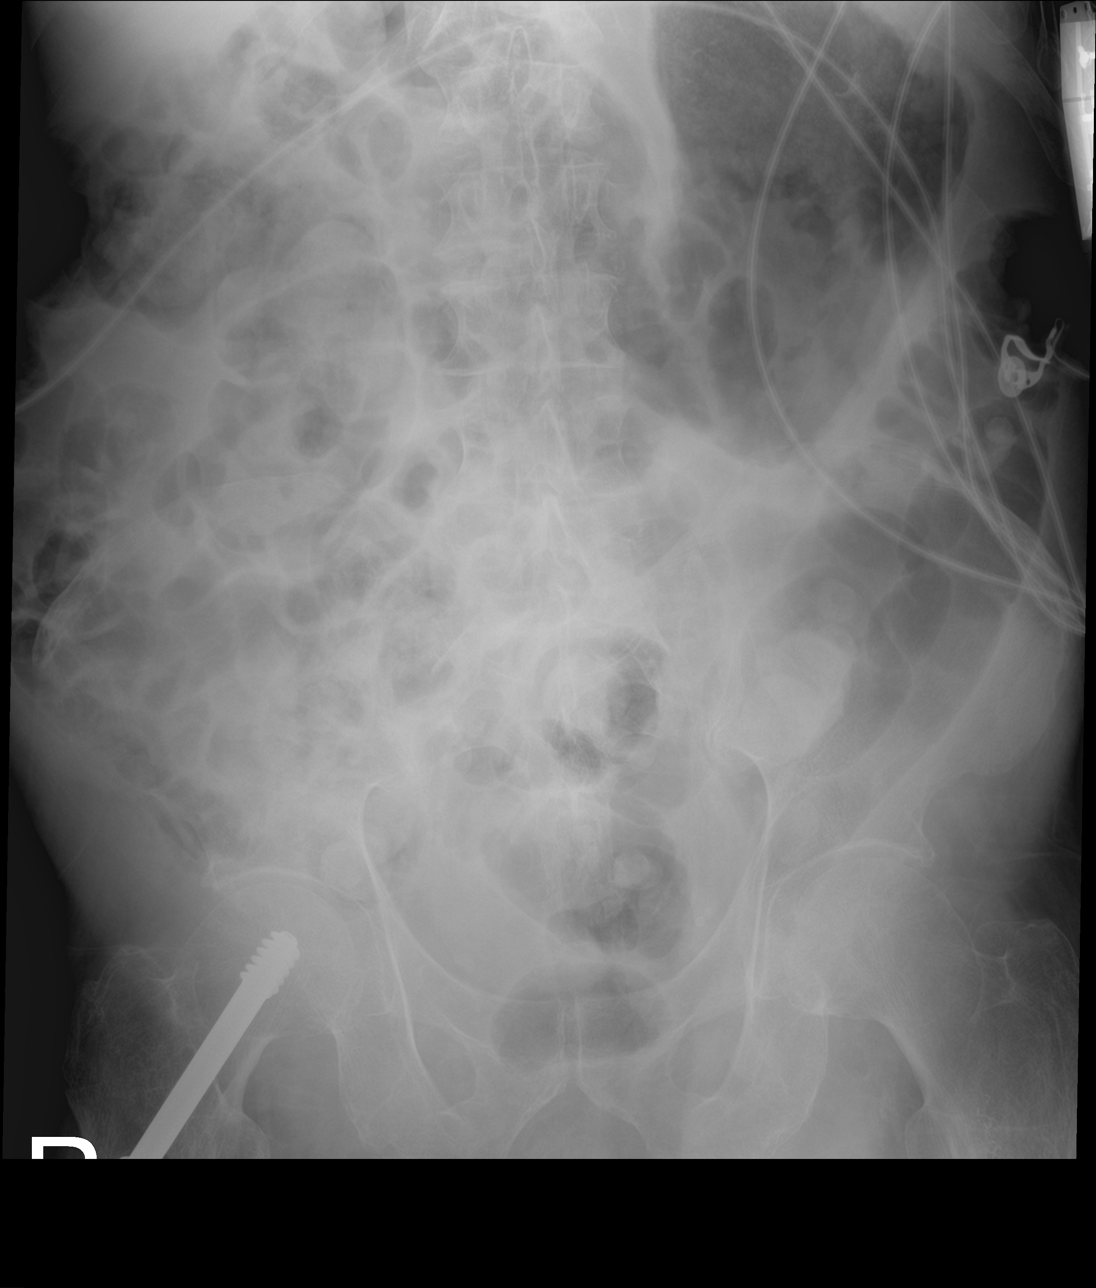

[abdomen supine (2 of 2)]
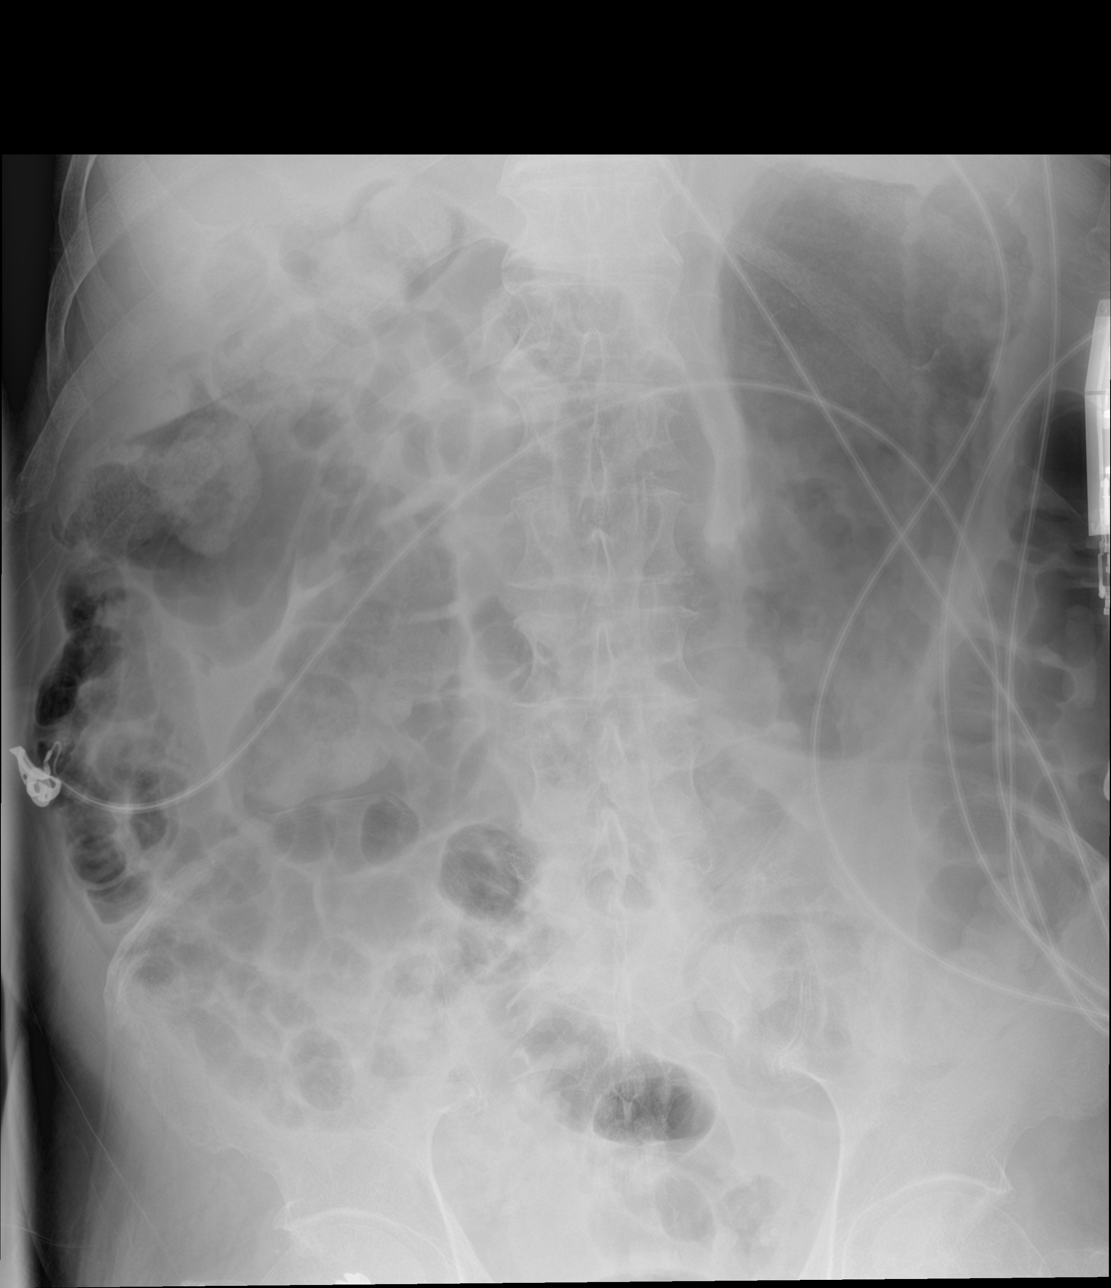

[3 of 3 positions shown; findings below may reference images not displayed]

FINDINGS: Upright and supine views of the abdomen and pelvis. Moderate gaseous
distension of the stomach. Mild elevation of the left hemidiaphragm.
No pneumoperitoneum.

Gas-filled but nondilated small and large bowel loops throughout the
abdomen and pelvis. Non obstructed pattern. Osteopenia.
Postoperative changes to the proximal right femur. No definite acute
osseous abnormality.
IMPRESSION: 1. Gaseous distension of the stomach, and gas throughout nondilated
small and large bowel loops. The appearance might reflect ileus but
does not suggest a mechanical bowel obstruction.
2. No free air.

## 2017-02-06 DIAGNOSIS — Z79891 Long term (current) use of opiate analgesic: Secondary | ICD-10-CM | POA: Diagnosis not present

## 2017-02-06 DIAGNOSIS — E785 Hyperlipidemia, unspecified: Secondary | ICD-10-CM | POA: Diagnosis not present

## 2017-02-06 DIAGNOSIS — A0471 Enterocolitis due to Clostridium difficile, recurrent: Secondary | ICD-10-CM | POA: Diagnosis not present

## 2017-02-06 DIAGNOSIS — J449 Chronic obstructive pulmonary disease, unspecified: Secondary | ICD-10-CM | POA: Diagnosis not present

## 2017-02-06 DIAGNOSIS — E46 Unspecified protein-calorie malnutrition: Secondary | ICD-10-CM | POA: Diagnosis not present

## 2017-02-06 DIAGNOSIS — E119 Type 2 diabetes mellitus without complications: Secondary | ICD-10-CM | POA: Diagnosis not present

## 2017-02-06 DIAGNOSIS — N259 Disorder resulting from impaired renal tubular function, unspecified: Secondary | ICD-10-CM | POA: Diagnosis not present

## 2017-02-06 DIAGNOSIS — Z794 Long term (current) use of insulin: Secondary | ICD-10-CM | POA: Diagnosis not present

## 2017-02-06 DIAGNOSIS — I509 Heart failure, unspecified: Secondary | ICD-10-CM | POA: Diagnosis not present

## 2017-02-06 DIAGNOSIS — I10 Essential (primary) hypertension: Secondary | ICD-10-CM | POA: Diagnosis not present

## 2017-02-06 DIAGNOSIS — M6281 Muscle weakness (generalized): Secondary | ICD-10-CM | POA: Diagnosis not present

## 2017-02-06 DIAGNOSIS — I2510 Atherosclerotic heart disease of native coronary artery without angina pectoris: Secondary | ICD-10-CM | POA: Diagnosis not present

## 2017-02-07 ENCOUNTER — Encounter: Payer: Self-pay | Admitting: Nurse Practitioner

## 2017-02-07 ENCOUNTER — Ambulatory Visit (INDEPENDENT_AMBULATORY_CARE_PROVIDER_SITE_OTHER): Payer: PPO | Admitting: Nurse Practitioner

## 2017-02-07 ENCOUNTER — Other Ambulatory Visit: Payer: Self-pay

## 2017-02-07 VITALS — BP 145/77 | HR 56 | Temp 97.1°F | Ht 70.0 in | Wt 171.4 lb

## 2017-02-07 DIAGNOSIS — A498 Other bacterial infections of unspecified site: Secondary | ICD-10-CM

## 2017-02-07 DIAGNOSIS — R197 Diarrhea, unspecified: Secondary | ICD-10-CM | POA: Diagnosis not present

## 2017-02-07 DIAGNOSIS — B9689 Other specified bacterial agents as the cause of diseases classified elsewhere: Secondary | ICD-10-CM

## 2017-02-07 NOTE — Progress Notes (Signed)
Referring Provider: Celene Squibb, MD Primary Care Physician:  Celene Squibb, MD Primary GI:  Dr. Gala Romney  Chief Complaint  Patient presents with  . C. Diff    f/u, doing better now. Has been in hospital at [facility] (01/17/17-01/23/17)    HPI:   Zachary Mcmahon is a 83 y.o. male who presents for follow-up on C. Difficile. At his last visit he was noted he had a history of recurrent C. difficile with a total of 4 rounds of vancomycin being given. At his last visit he stated he was not doing well and had a recurrence of 7-10 loose/watery stools a day, one week after finishing antibiotics. He was still on a probiotic at that time and still losing weight. Noted to be weak and fatigued. Had some vomiting recently just watery without blood. Recommended he continue taking his probiotic. Stool tests including C. difficile PCR, toxin a/V, GI pathogen panel ordered to check for infectious etiology. CBC, CMP also ordered. He was agreeable to colonoscopy if needed. Recommended if recurrence of C. difficile to move on to Dificid and possible referral to [facility] for consideration of fecal transplant.  It appears patient was admitted on 01/18/2017 for melena. At that time it was noted his C. difficile was positive for antigen, negative for toxin. GI consult it and recommended treatment as an active infection and he was put on another course of vancomycin and recommend 2-3 week follow-up for fecal transplant. Noted leukocytosis on admission which improved by discharge with initiation of antibiotics. A review of "care everywhere" appears to show referral for oral fecal matter transplant, but this has not occurred yet.  Today he states he feels good today. Still on antibiotics, likely has 2 days remaining. Denies abdominal pain, diarrhea, N/V, melena, further weight loss, fever, chills. Denies chest pain, dyspnea, dizziness, lightheadedness, syncope, near syncope. Denies any other upper or lower GI symptoms.  Past  Medical History:  Diagnosis Date  . Arthritis    "legs" (02/07/2015)  . Basal cell carcinoma of forehead 2016 X 2  . Basal cell carcinoma of left earlobe   . CHF (congestive heart failure) ([place])   . Diabetic peripheral neuropathy ([place])    "left foot" (02/07/2015)  . 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 GERD (gastroesophageal reflux disease)   . High potassium   . History of gout X 1  . Hyperlipidemia   . Hypertension   . Old myocardial infarct    "sometime in the past; don't know when" (02/07/2015)  . Pain and swelling of left lower leg    chronic  . Pneumonia X 1  . Renal insufficiency   . Respiratory failure ([place])   . Type II diabetes mellitus ([place])   . Varicose veins     Past Surgical History:  Procedure Laterality Date  . BASAL CELL CARCINOMA EXCISION     "probably 1/2 dozen cut off face, left ear" (02/07/2015)  . CARDIAC CATHETERIZATION N/A 02/09/2015   Procedure: Left Heart Cath and Coronary Angiography;  Surgeon: Leonie Man, MD;  Location: [facility] [facility];  Service: Cardiovascular;  Laterality: N/A;  . CARDIOVASCULAR STRESS TEST  05/29/2012   Mild-moderate perfusion defect due to infarct/scar with mild-moderate perinfarct ischemia seen in the mid anterior, apical anterior, apical septal, and apical regions. No ECG changes. Global LV systolic function is severely reduced.  Marland Kitchen CATARACT EXTRACTION W/PHACO Left 11/12/2015   Procedure: CATARACT EXTRACTION PHACO AND INTRAOCULAR LENS PLACEMENT LEFT EYE;  Surgeon: Tonny Branch, MD;  Location[facility];  Service: Ophthalmology;  Laterality: Left;  CDE: 9.82  . CATARACT EXTRACTION W/PHACO Right 12/14/2015   Procedure: CATARACT EXTRACTION PHACO AND INTRAOCULAR LENS PLACEMENT (IOC);  Surgeon: Tonny Branch, MD;  Location: [facility];  Service: Ophthalmology;  Laterality: Right;  CDE: 13.86  . CORONARY ARTERY BYPASS GRAFT N/A 02/17/2015   Procedure: CORONARY ARTERY BYPASS GRAFTING (CABG), ON PUMP, TIMES FIVE, USING LEFT  INTERNAL MAMMARY ARTERY, RIGHT GREATER SAPHENOUS VEIN HARVESTED ENDOSCOPICALLY;  Surgeon: [name], MD;  Location: [place];  Service: Open Heart Surgery;  Laterality: N/A;  -LIMA to LAD -SVG to DIAGONAL - SEQ SVG to OM1 and PLB -SVG to PDA  . CYSTOSCOPY W/ STONE MANIPULATION  X 1  . FRACTURE SURGERY    . KNEE ARTHROSCOPY Right ~ 2008  . LEXISCAM MYOCARDIAL PERFUSION  05/29/12   MARKED PERFUSION DEFECT DUE TO INFARC/SCAR WITH MILD PERINFARCT ISCHEMIA IN THE BASAL INFERIOR, MID INFEROSEPTAL, MID INFERIOR AND APICALINFERIOR REGION. EF%33%. PERIINFARCT ISCHEMIA IN THE MID ANTERIOR, APICAL ANTERIOR, APICAL SEPTAL AND APICAL REGIONS.  . LOWER EXTREMITY VENOUS DOPPLER  07/11/2012   No evidence of DVT in the left lower extremity. Evidence of partially recanalized, chronic, non-obstructive thrombus in the left great SV and its branches consistent with significant reflux consistent with post-phlebitic syndrome. Significant reflux of the left short saphenous vein.  Marland Kitchen open heart sx  02/17/15  . ORIF HIP FRACTURE  03/20/2012   Procedure: OPEN REDUCTION INTERNAL FIXATION HIP;  Surgeon: Sanjuana Kava, MD;  [facility];  Service: Orthopedics;  Laterality: Right;  . TEE WITHOUT CARDIOVERSION N/A 02/17/2015   Procedure: TRANSESOPHAGEAL ECHOCARDIOGRAM (TEE);  Surgeon: [name], MD;  Location: [place];  Service: Open Heart Surgery;  Laterality: N/A;  . TRANSTHORACIC ECHOCARDIOGRAM  04/18/2012   EF 45%, mild-moderate LVH  . TRANSTHORACIC ECHOCARDIOGRAM  04/18/12   EF% 45%.SEVERE HYPOKINESIS TO AKINESIS OF THE MID-DISTAL INFEROLATERAL MYOCARDIUM AND MUCH OF THE APEX.    Current Outpatient Prescriptions  Medication Sig Dispense Refill  . acetaminophen (TYLENOL) 500 MG tablet Take 500 mg by mouth every 6 (six) hours as needed for mild pain.    Marland Kitchen amLODipine (NORVASC) 5 MG tablet Take 1 tablet (5 mg total) by mouth daily. 30 tablet 6  . atorvastatin (LIPITOR) 20 MG tablet Take 1 tablet by mouth daily.     . carvedilol (COREG) 6.25 MG tablet Take 1 tablet (6.25 mg total) by mouth 2 (two) times daily. 180 tablet 3  . cholecalciferol (VITAMIN D) 1000 units tablet Take 1,000 Units by mouth daily.    . ferrous sulfate 325 (65 FE) MG tablet Take 1 tablet (325 mg total) by mouth daily with breakfast.  3  . furosemide (LASIX) 40 MG tablet Take one tab (40mg ) by mouth every morning & 1/2 tab (20mg ) every evening (Patient taking differently: Take 40 mg by mouth daily. ) 30 tablet   . gabapentin (NEURONTIN) 100 MG capsule Take 100 mg by mouth 3 (three) times daily.     Marland Kitchen LEVEMIR FLEXTOUCH 100 UNIT/ML Pen Inject 27 Units into the skin at bedtime. (Patient taking differently: Inject 12 Units into the skin at bedtime. Depends on blood sugar)    . losartan (COZAAR) 25 MG tablet Take 25 mg by mouth daily.    Marland Kitchen omeprazole (PRILOSEC) 20 MG capsule Take 20 mg by mouth daily.    Marland Kitchen OVER THE [place] extra strength probiotic daily    . traMADol (  ULTRAM) 50 MG tablet Take 50 mg by mouth every 6 (six) hours as needed for moderate pain.    . Vancomycin HCl 25 MG/ML SOLR Take by mouth. 2.5 ml by mouth 4 times daily     No current facility-administered medications for this visit.     Allergies as of 02/07/2017 - Review Complete 02/07/2017  Allergen Reaction Noted  . Bactrim [sulfamethoxazole-trimethoprim] Rash 04/11/2014  . Hydralazine Rash 10/24/2016    Family History  Problem Relation Age of Onset  . Deep vein thrombosis Mother   . Hypertension Mother   . Hypertension Sister   . Diabetes Brother   . Hyperlipidemia Brother   . Hypertension Brother   . Heart attack Brother   . Colon cancer Neg Hx     Social History   Social History  . Marital status: Married    Spouse name: N/A  . Number of children: N/A  . Years of education: N/A   Social History Main Topics  . Smoking status: Never Smoker  . Smokeless tobacco: Never Used  . Alcohol use No  . Drug use: No  . Sexual  activity: Not Currently   Other Topics Concern  . None   Social History Narrative  . None    Review of Systems: General: Negative for anorexia, weight loss, fever, chills, fatigue, weakness. ENT: Negative for hoarseness, difficulty swallowing. CV: Negative for chest pain, angina, palpitations, peripheral edema.  Respiratory: Negative for dyspnea at rest, cough, sputum, wheezing.  GI: See history of present illness. MS: Negative for joint pain, low back pain.  Derm: Negative for rash or itching.  Endo: Negative for unusual weight change.  Heme: Negative for bruising or bleeding. Allergy: Negative for rash or hives.   Physical Exam: BP (!) 145/77   Pulse (!) 56   Temp 97.1 F (36.2 C) (Oral)   Ht 5\' 10"  (1.778 m)   Wt 171 lb 6.4 oz (77.7 kg)   BMI 24.59 kg/m  General:   Alert and oriented. Pleasant and cooperative. Well-nourished and well-developed.  Eyes:  Without icterus, sclera clear and conjunctiva pink.  Ears:  Normal auditory acuity. Cardiovascular:  S1, S2 present without murmurs appreciated. Extremities without clubbing or edema. Respiratory:  Clear to auscultation bilaterally. No wheezes, rales, or rhonchi. No distress.  Gastrointestinal:  +BS, soft, non-tender and non-distended. No HSM noted. No guarding or rebound. No masses appreciated.  Rectal:  Deferred  Musculoskalatal:  Symmetrical without gross deformities. Neurologic:  Alert and oriented x4;  grossly normal neurologically. Psych:  Alert and cooperative. Normal mood and affect. Heme/Lymph/Immune: No excessive bruising noted.    02/07/2017 10:51 AM   Disclaimer: This note was dictated with voice recognition software. Similar sounding words can inadvertently be transcribed and may not be corrected upon review.

## 2017-02-07 NOTE — Patient Instructions (Signed)
1. Continue your current medications. 2. Our office will try to contact [facility] for the status of your appointment. 3. We will refer you to than for any recurrent diarrhea giving her ongoing infection with C. difficile. 4. Return for follow-up here in 3 months. 5. Call us if any severe or worsening symptoms.

## 2017-02-07 NOTE — Assessment & Plan Note (Signed)
The patient has had multiple recurrences of C. difficile colitis. Likely 1 initial occurrence and then 5 subsequent occurrences all treated with a total of 6 courses of vancomycin including the last regimen he was given at [facility] when he was admitted for dehydration and diarrhea at the end of last month. His stool antigen was positive and clinically symptomatic and he was recommended to be treated as an active infection. Recommended follow-up with GI as an outpatient at [facility] for fecal matter transplant. The patient states he has not heard anything from [facility] yet. He does state, however, that insurance initially denied fecal matter transplant because his stool was negative for toxin. This seems contradictory to standards of practice and established infectious disease ID of [place] guidelines. Recommend he continue his current medications, finished his antibiotics. I will have staff reach out to [facility] to see what the status of his evaluation is. If he has recurrent diarrhea I'll have him follow-up with [facility] for further evaluation given his multiple recurrences of C. difficile. Return for follow-up here in 3 months to touch base.

## 2017-02-07 NOTE — Progress Notes (Signed)
cc'ed to pcp °

## 2017-02-08 DIAGNOSIS — E785 Hyperlipidemia, unspecified: Secondary | ICD-10-CM | POA: Diagnosis not present

## 2017-02-08 DIAGNOSIS — N259 Disorder resulting from impaired renal tubular function, unspecified: Secondary | ICD-10-CM | POA: Diagnosis not present

## 2017-02-08 DIAGNOSIS — J449 Chronic obstructive pulmonary disease, unspecified: Secondary | ICD-10-CM | POA: Diagnosis not present

## 2017-02-08 DIAGNOSIS — Z79891 Long term (current) use of opiate analgesic: Secondary | ICD-10-CM | POA: Diagnosis not present

## 2017-02-08 DIAGNOSIS — Z794 Long term (current) use of insulin: Secondary | ICD-10-CM | POA: Diagnosis not present

## 2017-02-08 DIAGNOSIS — I2510 Atherosclerotic heart disease of native coronary artery without angina pectoris: Secondary | ICD-10-CM | POA: Diagnosis not present

## 2017-02-08 DIAGNOSIS — I509 Heart failure, unspecified: Secondary | ICD-10-CM | POA: Diagnosis not present

## 2017-02-08 DIAGNOSIS — E1122 Type 2 diabetes mellitus with diabetic chronic kidney disease: Secondary | ICD-10-CM | POA: Diagnosis not present

## 2017-02-08 DIAGNOSIS — E46 Unspecified protein-calorie malnutrition: Secondary | ICD-10-CM | POA: Diagnosis not present

## 2017-02-08 DIAGNOSIS — E119 Type 2 diabetes mellitus without complications: Secondary | ICD-10-CM | POA: Diagnosis not present

## 2017-02-08 DIAGNOSIS — M6281 Muscle weakness (generalized): Secondary | ICD-10-CM | POA: Diagnosis not present

## 2017-02-08 DIAGNOSIS — I10 Essential (primary) hypertension: Secondary | ICD-10-CM | POA: Diagnosis not present

## 2017-02-08 DIAGNOSIS — A0471 Enterocolitis due to Clostridium difficile, recurrent: Secondary | ICD-10-CM | POA: Diagnosis not present

## 2017-02-09 DIAGNOSIS — I2510 Atherosclerotic heart disease of native coronary artery without angina pectoris: Secondary | ICD-10-CM | POA: Diagnosis not present

## 2017-02-09 DIAGNOSIS — D509 Iron deficiency anemia, unspecified: Secondary | ICD-10-CM | POA: Diagnosis not present

## 2017-02-09 DIAGNOSIS — E1122 Type 2 diabetes mellitus with diabetic chronic kidney disease: Secondary | ICD-10-CM | POA: Diagnosis not present

## 2017-02-09 DIAGNOSIS — I480 Paroxysmal atrial fibrillation: Secondary | ICD-10-CM | POA: Diagnosis not present

## 2017-02-09 DIAGNOSIS — R809 Proteinuria, unspecified: Secondary | ICD-10-CM | POA: Diagnosis not present

## 2017-02-09 DIAGNOSIS — E782 Mixed hyperlipidemia: Secondary | ICD-10-CM | POA: Diagnosis not present

## 2017-02-09 DIAGNOSIS — R040 Epistaxis: Secondary | ICD-10-CM | POA: Diagnosis not present

## 2017-02-09 DIAGNOSIS — N184 Chronic kidney disease, stage 4 (severe): Secondary | ICD-10-CM | POA: Diagnosis not present

## 2017-02-09 DIAGNOSIS — I10 Essential (primary) hypertension: Secondary | ICD-10-CM | POA: Diagnosis not present

## 2017-02-10 DIAGNOSIS — A0471 Enterocolitis due to Clostridium difficile, recurrent: Secondary | ICD-10-CM | POA: Diagnosis not present

## 2017-02-10 DIAGNOSIS — M6281 Muscle weakness (generalized): Secondary | ICD-10-CM | POA: Diagnosis not present

## 2017-02-10 DIAGNOSIS — I2510 Atherosclerotic heart disease of native coronary artery without angina pectoris: Secondary | ICD-10-CM | POA: Diagnosis not present

## 2017-02-10 DIAGNOSIS — E46 Unspecified protein-calorie malnutrition: Secondary | ICD-10-CM | POA: Diagnosis not present

## 2017-02-11 IMAGING — DX DG CHEST 2V
3 series · 3 of 3 positions shown · non-contrast
Comparison: 02/13/2015; 02/12/2015; 02/11/2015

CLINICAL DATA: Congestive heart failure

EXAM:
CHEST  2 VIEW

[chest lat (1 of 2)]
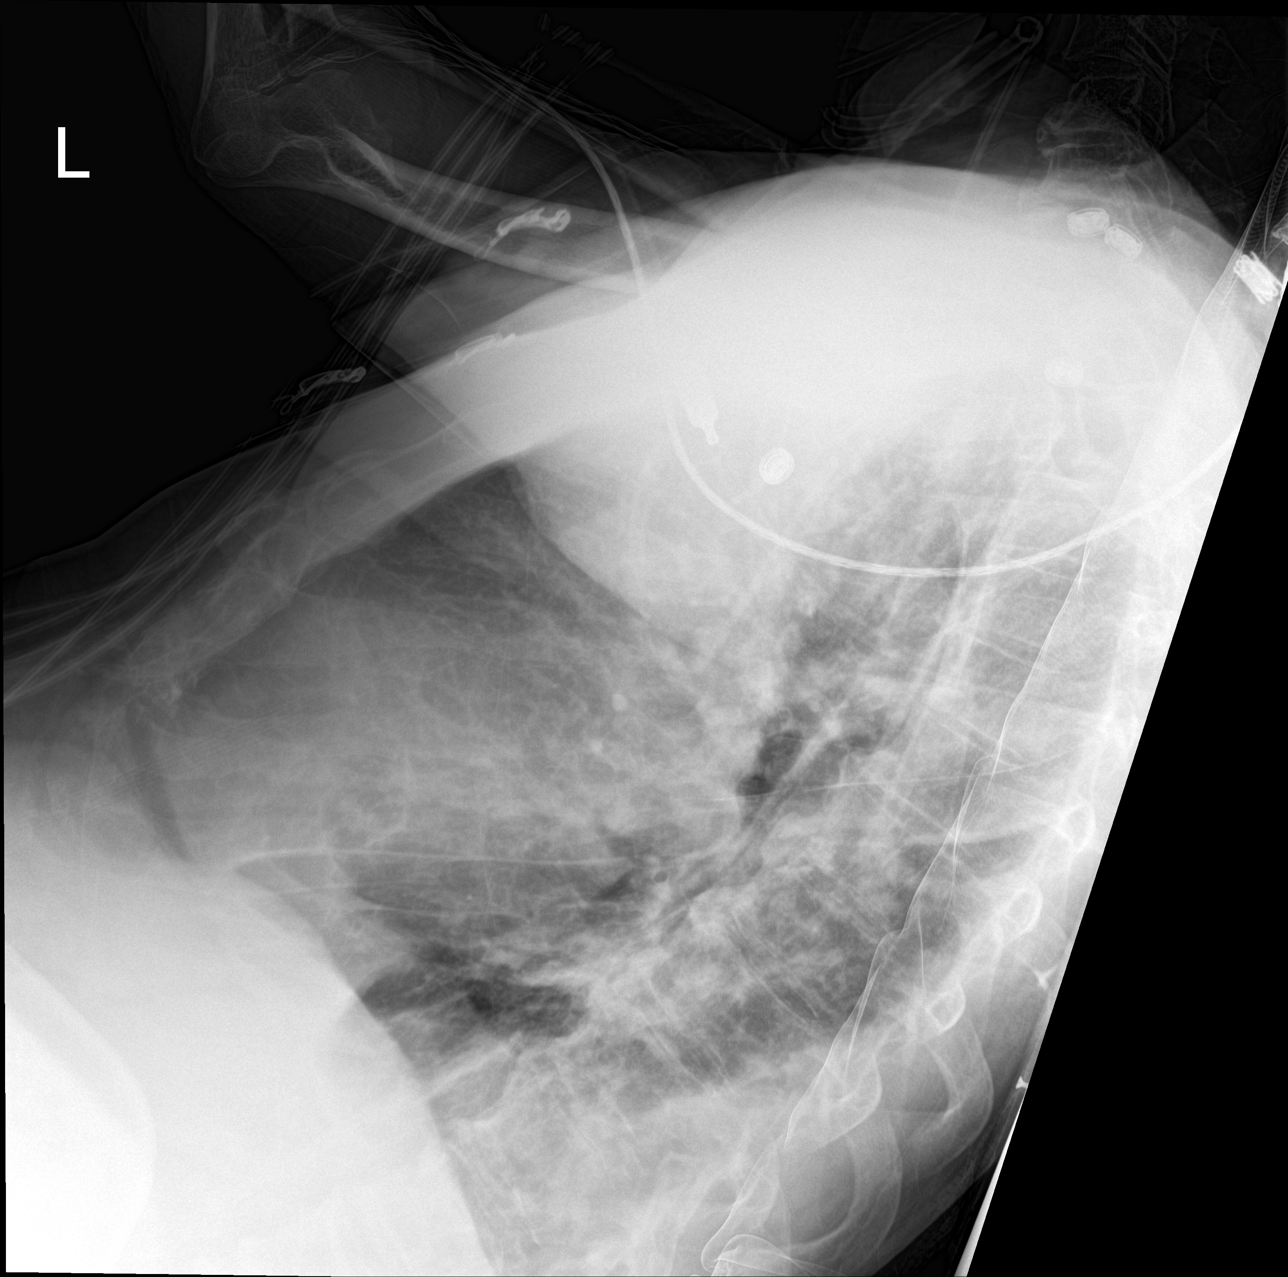

[chest ap]
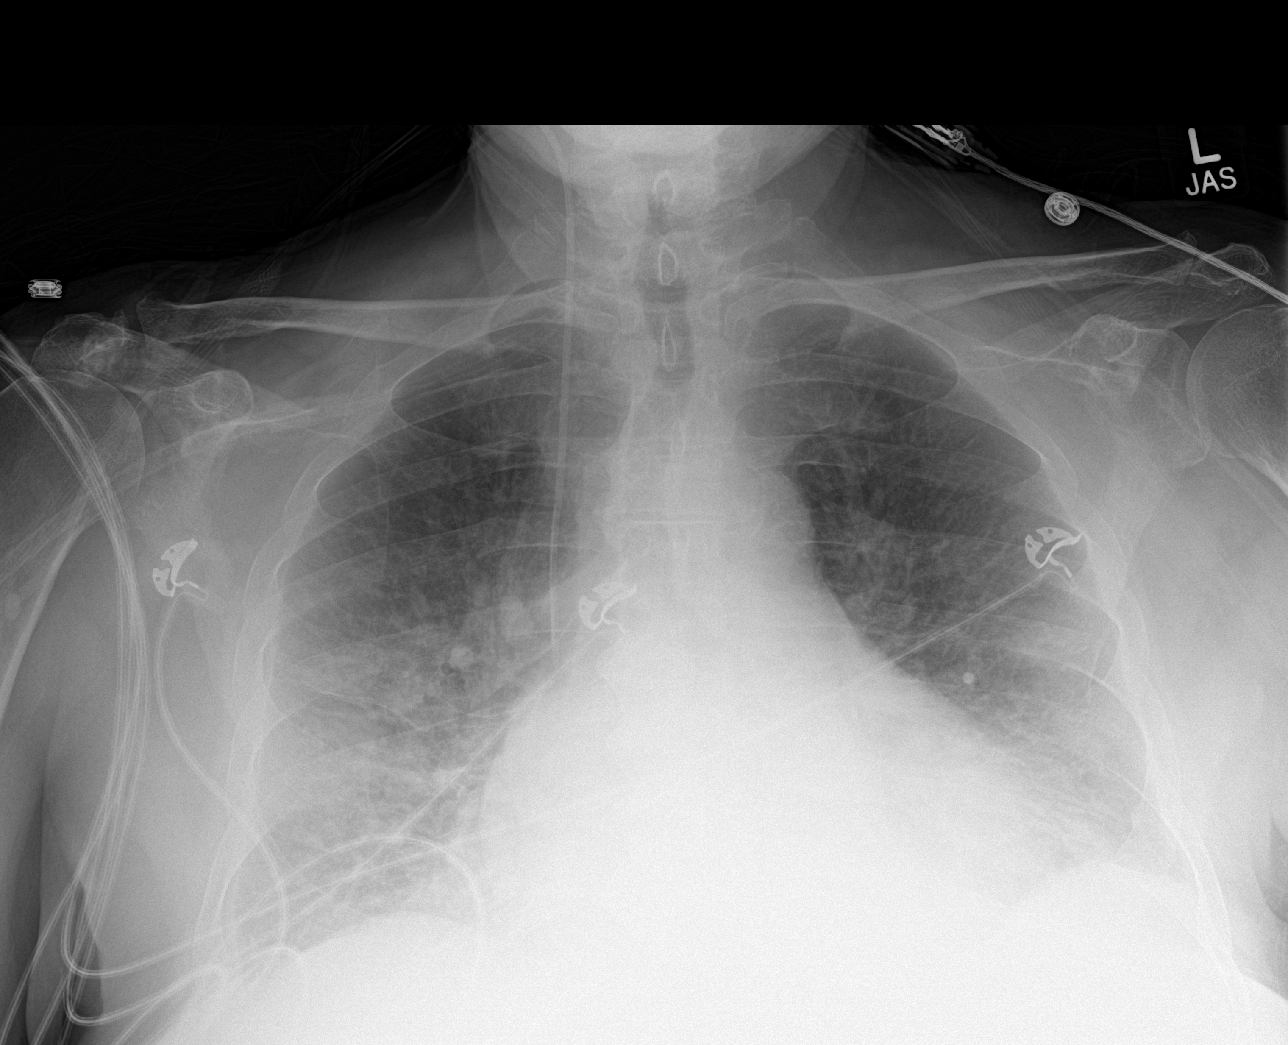

[chest lat (2 of 2)]
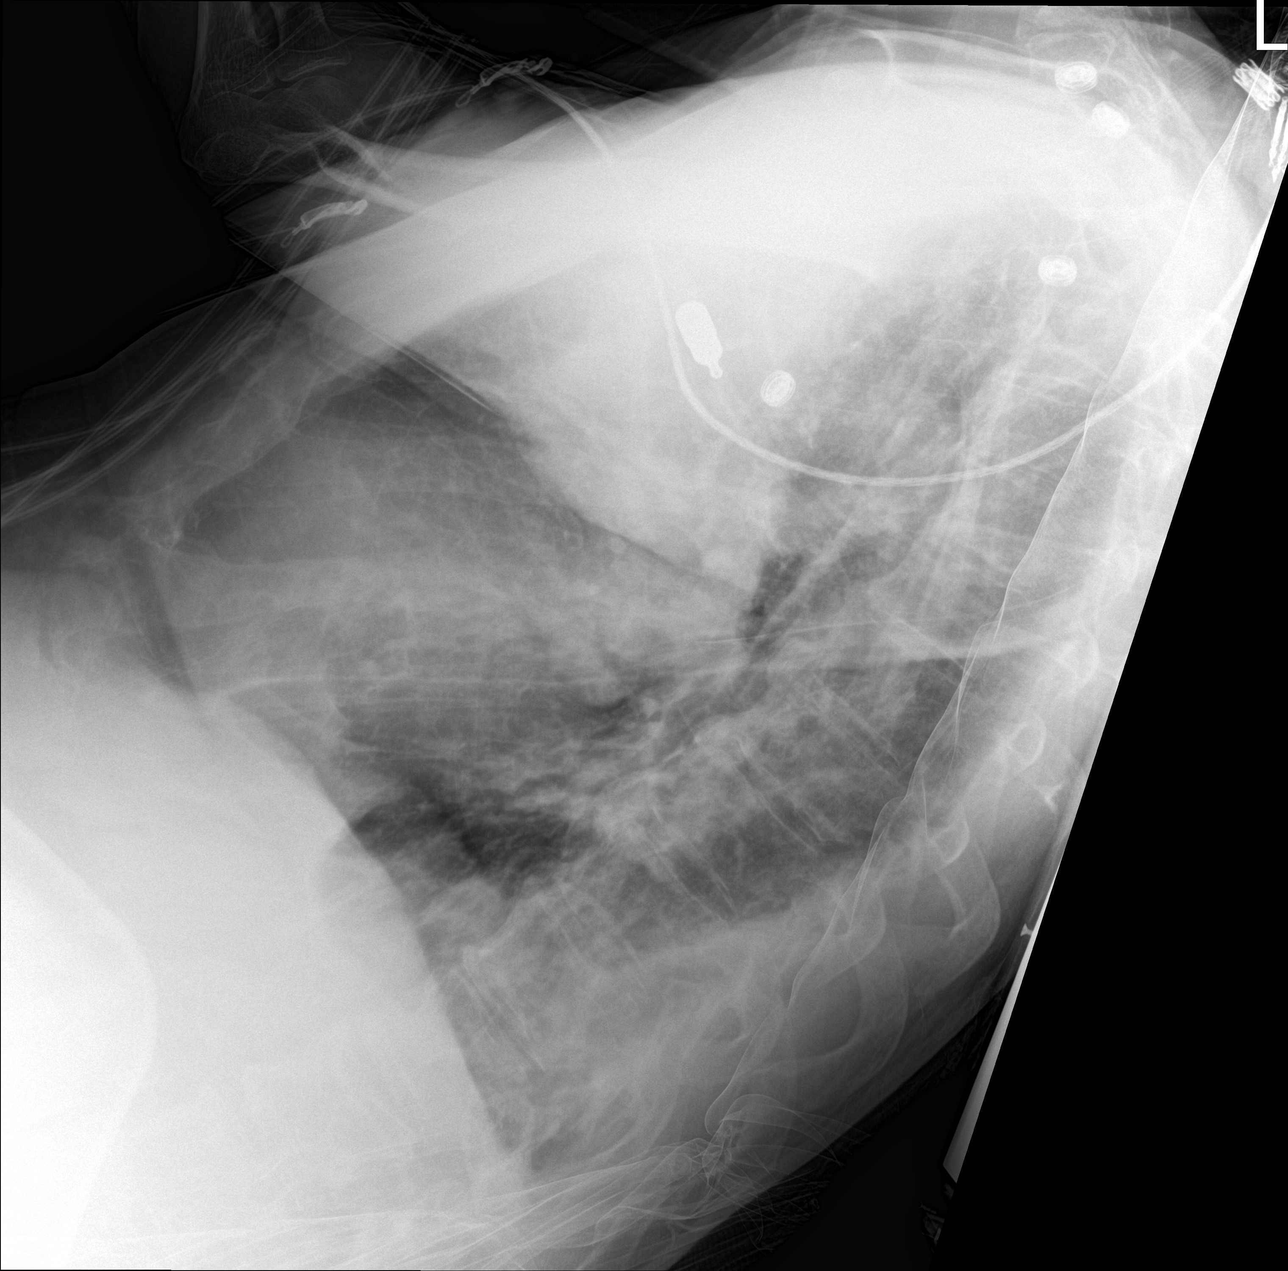

[3 of 3 positions shown; findings below may reference images not displayed]

FINDINGS: Grossly unchanged enlarged cardiac silhouette and mediastinal
contours given persistently reduced lung volumes. The pulmonary
vasculature remains distinct with cephalization of flow. Minimally
improved aeration of the lung bases with residual bibasilar
opacities, left greater than right. No new focal airspace opacities.
Trace bilateral effusions are suspected. Stable position of support
apparatus. No pneumothorax. Unchanged bones.
IMPRESSION: Similar findings of hypoventilation and pulmonary edema with no
change to minimally improved bibasilar atelectasis, left greater
than right.

## 2017-03-01 ENCOUNTER — Ambulatory Visit (INDEPENDENT_AMBULATORY_CARE_PROVIDER_SITE_OTHER): Payer: PPO | Admitting: Cardiovascular Disease

## 2017-03-01 ENCOUNTER — Encounter: Payer: Self-pay | Admitting: Cardiovascular Disease

## 2017-03-01 VITALS — BP 142/68 | HR 58 | Ht 70.0 in | Wt 180.0 lb

## 2017-03-01 DIAGNOSIS — R9439 Abnormal result of other cardiovascular function study: Secondary | ICD-10-CM

## 2017-03-01 DIAGNOSIS — I10 Essential (primary) hypertension: Secondary | ICD-10-CM

## 2017-03-01 DIAGNOSIS — I5042 Chronic combined systolic (congestive) and diastolic (congestive) heart failure: Secondary | ICD-10-CM | POA: Diagnosis not present

## 2017-03-01 DIAGNOSIS — I25768 Atherosclerosis of bypass graft of coronary artery of transplanted heart with other forms of angina pectoris: Secondary | ICD-10-CM | POA: Diagnosis not present

## 2017-03-01 DIAGNOSIS — R600 Localized edema: Secondary | ICD-10-CM | POA: Diagnosis not present

## 2017-03-01 DIAGNOSIS — R000 Tachycardia, unspecified: Secondary | ICD-10-CM | POA: Diagnosis not present

## 2017-03-01 DIAGNOSIS — R040 Epistaxis: Secondary | ICD-10-CM | POA: Diagnosis not present

## 2017-03-01 NOTE — Patient Instructions (Signed)

## 2017-03-01 NOTE — Progress Notes (Signed)
SUBJECTIVE: The patient returns for follow-up of coronary artery disease and chronic systolic and diastolic heart failure. Nuclear stress test on 10/23/15 demonstrated a large defect involving the inferoseptal, inferior, and lateral wall extending from the apex to the base with some degree of reversibility suggestive of moderate lateral ischemia. It was deemed a high risk study.  Echocardiogram performed on 10/04/15 demonstrated mild to moderately reduced left ventricular systolic function, EF 16-10%, with inferior wall hypokinesis, grade 2 diastolic dysfunction , and mild to moderate mitral regurgitation.   He underwent 5 vessel coronary artery bypass graft surgery on 02/17/15. He has a history of pulmonary embolism and is anticoagulated with Eliquis. He also has chronic kidney disease stage III. The EF had dropped and had previously been normal , 50-55%, in July 2016.  Eliquis was stopped due to recurrent nosebleeds by ENT at [facility]. ASA was also stopped.  He denies any recurrent nosebleeds. He said this is the belt he has felt in a long time. He denies chest pain, palpitations, and shortness of breath. He has chronic lower extremity edema. He had been taking Lasix twice daily but due to urinary incontinence, he prefers to take only 40 mg every morning.    Review of Systems: As per "subjective", otherwise negative.  Allergies  Allergen Reactions  . Bactrim [Sulfamethoxazole-Trimethoprim] Rash  . Hydralazine Rash    Current Outpatient Prescriptions  Medication Sig Dispense Refill  . acetaminophen (TYLENOL) 500 MG tablet Take 500 mg by mouth every 6 (six) hours as needed for mild pain.    Marland Kitchen amLODipine (NORVASC) 5 MG tablet Take 1 tablet (5 mg total) by mouth daily. 30 tablet 6  . atorvastatin (LIPITOR) 20 MG tablet Take 1 tablet by mouth daily.    . carvedilol (COREG) 6.25 MG tablet Take 1 tablet (6.25 mg total) by mouth 2 (two) times daily. 180 tablet 3  . cholecalciferol  (VITAMIN D) 1000 units tablet Take 1,000 Units by mouth daily.    . ferrous sulfate 325 (65 FE) MG tablet Take 1 tablet (325 mg total) by mouth daily with breakfast.  3  . furosemide (LASIX) 20 MG tablet Take 20 mg by mouth daily.    Marland Kitchen gabapentin (NEURONTIN) 100 MG capsule Take 100 mg by mouth 3 (three) times daily.     Marland Kitchen LEVEMIR FLEXTOUCH 100 UNIT/ML Pen Inject 27 Units into the skin at bedtime. (Patient taking differently: Inject 12 Units into the skin at bedtime. Depends on blood sugar)    . losartan (COZAAR) 25 MG tablet Take 25 mg by mouth daily.    Marland Kitchen omeprazole (PRILOSEC) 20 MG capsule Take 20 mg by mouth daily.    . traMADol (ULTRAM) 50 MG tablet Take 50 mg by mouth every 6 (six) hours as needed for moderate pain.    Marland Kitchen OVER THE [place] extra strength probiotic daily     No current facility-administered medications for this visit.     Past Medical History:  Diagnosis Date  . Arthritis    "legs" (02/07/2015)  . Basal cell carcinoma of forehead 2016 X 2  . Basal cell carcinoma of left earlobe   . CHF (congestive heart failure) ([place])   . Diabetic peripheral neuropathy ([place])    "left foot" (02/07/2015)  . 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 GERD (gastroesophageal reflux disease)   . High potassium   . History of gout X 1  . Hyperlipidemia   . Hypertension   .  Old myocardial infarct    "sometime in the past; don't know when" (02/07/2015)  . Pain and swelling of left lower leg    chronic  . Pneumonia X 1  . Renal insufficiency   . Respiratory failure ([place])   . Type II diabetes mellitus ([place])   . Varicose veins     Past Surgical History:  Procedure Laterality Date  . BASAL CELL CARCINOMA EXCISION     "probably 1/2 dozen cut off face, left ear" (02/07/2015)  . CARDIAC CATHETERIZATION N/A 02/09/2015   Procedure: Left Heart Cath and Coronary Angiography;  Surgeon: Leonie Man, MD;  Location: [place] [facility];  Service:  Cardiovascular;  Laterality: N/A;  . CARDIOVASCULAR STRESS TEST  05/29/2012   Mild-moderate perfusion defect due to infarct/scar with mild-moderate perinfarct ischemia seen in the mid anterior, apical anterior, apical septal, and apical regions. No ECG changes. Global LV systolic function is severely reduced.  Marland Kitchen CATARACT EXTRACTION W/PHACO Left 11/12/2015   Procedure: CATARACT EXTRACTION PHACO AND INTRAOCULAR LENS PLACEMENT LEFT EYE;  Surgeon: Tonny Branch, MD;  Location: [facility];  Service: Ophthalmology;  Laterality: Left;  CDE: 9.82  . CATARACT EXTRACTION W/PHACO Right 12/14/2015   Procedure: CATARACT EXTRACTION PHACO AND INTRAOCULAR LENS PLACEMENT (IOC);  Surgeon: Tonny Branch, MD;  Location: [facility];  Service: Ophthalmology;  Laterality: Right;  CDE: 13.86  . CORONARY ARTERY BYPASS GRAFT N/A 02/17/2015   Procedure: CORONARY ARTERY BYPASS GRAFTING (CABG), ON PUMP, TIMES FIVE, USING LEFT INTERNAL MAMMARY ARTERY, RIGHT GREATER SAPHENOUS VEIN HARVESTED ENDOSCOPICALLY;  Surgeon: [name], MD;  Location: [place];  Service: Open Heart Surgery;  Laterality: N/A;  -LIMA to LAD -SVG to DIAGONAL - SEQ SVG to OM1 and PLB -SVG to PDA  . CYSTOSCOPY W/ STONE MANIPULATION  X 1  . FRACTURE SURGERY    . KNEE ARTHROSCOPY Right ~ 2008  . LEXISCAM MYOCARDIAL PERFUSION  05/29/12   MARKED PERFUSION DEFECT DUE TO INFARC/SCAR WITH MILD PERINFARCT ISCHEMIA IN THE BASAL INFERIOR, MID INFEROSEPTAL, MID INFERIOR AND APICALINFERIOR REGION. EF%33%. PERIINFARCT ISCHEMIA IN THE MID ANTERIOR, APICAL ANTERIOR, APICAL SEPTAL AND APICAL REGIONS.  . LOWER EXTREMITY VENOUS DOPPLER  07/11/2012   No evidence of DVT in the left lower extremity. Evidence of partially recanalized, chronic, non-obstructive thrombus in the left great SV and its branches consistent with significant reflux consistent with post-phlebitic syndrome. Significant reflux of the left short saphenous vein.  Marland Kitchen open heart sx  02/17/15  . ORIF HIP FRACTURE  03/20/2012    Procedure: OPEN REDUCTION INTERNAL FIXATION HIP;  Surgeon: Sanjuana Kava, MD;  Location: [facility];  Service: Orthopedics;  Laterality: Right;  . TEE WITHOUT CARDIOVERSION N/A 02/17/2015   Procedure: TRANSESOPHAGEAL ECHOCARDIOGRAM (TEE);  Surgeon: [name], MD;  Location: [place];  Service: Open Heart Surgery;  Laterality: N/A;  . TRANSTHORACIC ECHOCARDIOGRAM  04/18/2012   EF 45%, mild-moderate LVH  . TRANSTHORACIC ECHOCARDIOGRAM  04/18/12   EF% 45%.SEVERE HYPOKINESIS TO AKINESIS OF THE MID-DISTAL INFEROLATERAL MYOCARDIUM AND MUCH OF THE APEX.    Social History   Social History  . Marital status: Married    Spouse name: N/A  . Number of children: N/A  . Years of education: N/A   Occupational History  . Not on file.   Social History Main Topics  . Smoking status: Never Smoker  . Smokeless tobacco: Never Used  . Alcohol use No  . Drug use: No  . Sexual activity: Not Currently   Other Topics Concern  .  Not on file   Social History Narrative  . No narrative on file     Vitals:   03/01/17 1412  BP: (!) 142/68  Pulse: (!) 58  SpO2: 98%  Weight: 180 lb (81.6 kg)  Height: 5\' 10"  (1.778 m)    Wt Readings from Last 3 Encounters:  03/01/17 180 lb (81.6 kg)  02/07/17 171 lb 6.4 oz (77.7 kg)  01/10/17 170 lb 9.6 oz (77.4 kg)     PHYSICAL EXAM General: NAD HEENT: Normal. Neck: No JVD, no thyromegaly. Lungs: Clear to auscultation bilaterally with normal respiratory effort. CV: Nondisplaced PMI.  Regular rate and rhythm, normal S1/S2, no S3/S4, 3/6 apical holosystolic murmur. 1+ pitting b/l pretibial edema.     Abdomen: Soft, nontender, no distention.  Neurologic: Alert and oriented.  Psych: Normal affect. Skin: Normal. Musculoskeletal: No gross deformities.    ECG: Most recent ECG reviewed.   Labs: Lab Results  Component Value Date/Time   K 4.1 12/30/2016 04:31 PM   BUN 39 (H) 12/30/2016 04:31 PM   CREATININE 1.48 (H) 12/30/2016 04:31 PM   CREATININE 1.45  (H) 05/15/2015 11:47 AM   ALT 15 (L) 12/20/2016 02:15 PM   TSH 1.364 02/07/2015 12:47 AM   TSH 1.634 03/20/2012 08:20 AM   HGB 10.5 (L) 12/30/2016 04:07 PM     Lipids: Lab Results  Component Value Date/Time   LDLCALC 48 02/07/2015 05:33 AM   CHOL 83 02/07/2015 05:33 AM   TRIG 91 02/07/2015 05:33 AM   HDL 17 (L) 02/07/2015 05:33 AM       ASSESSMENT AND PLAN: 1. Chronic combined systolic and diastolic heart failure: Continue Lasix and Coreg. He prefers taking Lasix 40 mg only every morning due to urinary incontinence.  2. CAD s/p 5-v CABG: On Lipitor. No longer on ASA. Lexiscan Cardiolite indicative of ischemic etiology for EF reduction. Continue Coreg. We previously discussed coronary angiography and he did not want to proceed.  3. Pulmonary embolism: Occurred in post-op state. Will hold off on Eliquis altogether.  4. Essential HTN: Mildly elevated. Hydralazine led to a rash. Will monitor.  5. Hyperlipidemia: Continue Lipitor.  6. Tachycardia: Resolved with Coreg.  7. Chronic venous stasis with ulceration: Stable.    Disposition: Follow up 6 months   Kate Sable, M.D., F.A.C.C.

## 2017-03-10 DIAGNOSIS — Z792 Long term (current) use of antibiotics: Secondary | ICD-10-CM | POA: Diagnosis not present

## 2017-03-10 DIAGNOSIS — A0471 Enterocolitis due to Clostridium difficile, recurrent: Secondary | ICD-10-CM | POA: Diagnosis not present

## 2017-03-14 ENCOUNTER — Other Ambulatory Visit: Payer: Self-pay | Admitting: Cardiovascular Disease

## 2017-05-03 ENCOUNTER — Encounter: Payer: PPO | Attending: Internal Medicine | Admitting: Nutrition

## 2017-05-03 VITALS — Ht 69.0 in | Wt 200.0 lb

## 2017-05-03 DIAGNOSIS — Z713 Dietary counseling and surveillance: Secondary | ICD-10-CM | POA: Diagnosis not present

## 2017-05-03 DIAGNOSIS — E118 Type 2 diabetes mellitus with unspecified complications: Secondary | ICD-10-CM | POA: Insufficient documentation

## 2017-05-03 DIAGNOSIS — E119 Type 2 diabetes mellitus without complications: Secondary | ICD-10-CM

## 2017-05-03 DIAGNOSIS — E669 Obesity, unspecified: Secondary | ICD-10-CM

## 2017-05-03 NOTE — Patient Instructions (Addendum)
Keep up the great job Increase more lower carb vegetables -2 at lunch and dinner Only eat 1 packet of oatmeal with breakfast instead of 2 packets.

## 2017-05-03 NOTE — Progress Notes (Signed)
Diabetes Self-Management Education  Visit Type:    Appt. Start Time: 1030 Appt. End Time: 1100 05/03/2017  [name], identified by name and date of birth, is a 83 y.o. male with a diagnosis of Diabetes:  .  Currently on Levemir 20 or so units at night depending on what his blood sugars are when he checks it before going to bed. Sometimes only gives 8-11 units of insulin. No low blood sugars. . Last A1C was 6%.  PCP Dr. [place] Mcmahon. FBS 97-117. Evening BS 137-189 mg/dl. Wt is up 20 lbs. He says he is eating a lot more. May have some fluid retention.  No longer being followed by [facility]. Still drives by himself and walks with a cane.  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     He notes he is eating well and feels good. He has been cleared of CDIFF now apparently.   ASSESSMENT  Height 5\' 9"  (1.753 m), weight 200 lb (90.7 kg). Body mass index is 29.53 kg/m.  B) Eggs, oatmeal with [place] sausage, fruit, L) CHicken, creamed potatoes, baked beans and strawberries, water D) Same as lunch.   Lab Results  Component Value Date   HGBA1C 7.5 (H) 02/07/2015     Individualized Plan for Diabetes Self-Management Training:   Learning Objective:  Patient will have a greater understanding of diabetes self-management. Patient education plan is to attend individual and/or group sessions per assessed needs and concerns.   Plan:    Keep up the great job Increase more lower carb vegetables -2 at lunch and dinner Only eat 1 packet of oatmeal with breakfast instead of 2 packets.  Expected Outcomes:     Education material provided: Living Well with Diabetes, Food label handouts, A1C conversion sheet, Meal plan card and Carbohydrate counting sheet  If problems or questions, patient to contact team via:  Phone and Email  Future DSME appointment:    6 months.

## 2017-05-10 ENCOUNTER — Ambulatory Visit: Payer: PPO | Admitting: Nurse Practitioner

## 2017-05-10 ENCOUNTER — Other Ambulatory Visit: Payer: Self-pay | Admitting: Cardiovascular Disease

## 2017-05-10 DIAGNOSIS — I10 Essential (primary) hypertension: Secondary | ICD-10-CM | POA: Diagnosis not present

## 2017-05-10 DIAGNOSIS — G8929 Other chronic pain: Secondary | ICD-10-CM | POA: Diagnosis not present

## 2017-05-10 DIAGNOSIS — G5691 Unspecified mononeuropathy of right upper limb: Secondary | ICD-10-CM | POA: Diagnosis not present

## 2017-05-10 DIAGNOSIS — M79601 Pain in right arm: Secondary | ICD-10-CM | POA: Diagnosis not present

## 2017-05-12 DIAGNOSIS — I10 Essential (primary) hypertension: Secondary | ICD-10-CM | POA: Diagnosis not present

## 2017-05-12 DIAGNOSIS — R7309 Other abnormal glucose: Secondary | ICD-10-CM | POA: Diagnosis not present

## 2017-05-12 DIAGNOSIS — E782 Mixed hyperlipidemia: Secondary | ICD-10-CM | POA: Diagnosis not present

## 2017-05-16 DIAGNOSIS — D649 Anemia, unspecified: Secondary | ICD-10-CM | POA: Diagnosis not present

## 2017-05-16 DIAGNOSIS — R809 Proteinuria, unspecified: Secondary | ICD-10-CM | POA: Diagnosis not present

## 2017-05-16 DIAGNOSIS — E1122 Type 2 diabetes mellitus with diabetic chronic kidney disease: Secondary | ICD-10-CM | POA: Diagnosis not present

## 2017-05-16 DIAGNOSIS — A0472 Enterocolitis due to Clostridium difficile, not specified as recurrent: Secondary | ICD-10-CM | POA: Diagnosis not present

## 2017-05-16 DIAGNOSIS — I519 Heart disease, unspecified: Secondary | ICD-10-CM | POA: Diagnosis not present

## 2017-05-16 DIAGNOSIS — N184 Chronic kidney disease, stage 4 (severe): Secondary | ICD-10-CM | POA: Diagnosis not present

## 2017-05-16 DIAGNOSIS — I480 Paroxysmal atrial fibrillation: Secondary | ICD-10-CM | POA: Diagnosis not present

## 2017-05-16 DIAGNOSIS — G629 Polyneuropathy, unspecified: Secondary | ICD-10-CM | POA: Diagnosis not present

## 2017-05-16 DIAGNOSIS — I10 Essential (primary) hypertension: Secondary | ICD-10-CM | POA: Diagnosis not present

## 2017-05-16 DIAGNOSIS — Z23 Encounter for immunization: Secondary | ICD-10-CM | POA: Diagnosis not present

## 2017-06-27 DIAGNOSIS — M792 Neuralgia and neuritis, unspecified: Secondary | ICD-10-CM | POA: Diagnosis not present

## 2017-08-03 DIAGNOSIS — E782 Mixed hyperlipidemia: Secondary | ICD-10-CM | POA: Diagnosis not present

## 2017-08-03 DIAGNOSIS — M792 Neuralgia and neuritis, unspecified: Secondary | ICD-10-CM | POA: Diagnosis not present

## 2017-08-03 DIAGNOSIS — I10 Essential (primary) hypertension: Secondary | ICD-10-CM | POA: Diagnosis not present

## 2017-08-03 DIAGNOSIS — E1122 Type 2 diabetes mellitus with diabetic chronic kidney disease: Secondary | ICD-10-CM | POA: Diagnosis not present

## 2017-08-11 DIAGNOSIS — N184 Chronic kidney disease, stage 4 (severe): Secondary | ICD-10-CM | POA: Diagnosis not present

## 2017-08-11 DIAGNOSIS — I519 Heart disease, unspecified: Secondary | ICD-10-CM | POA: Diagnosis not present

## 2017-08-11 DIAGNOSIS — R809 Proteinuria, unspecified: Secondary | ICD-10-CM | POA: Diagnosis not present

## 2017-08-11 DIAGNOSIS — R944 Abnormal results of kidney function studies: Secondary | ICD-10-CM | POA: Diagnosis not present

## 2017-08-11 DIAGNOSIS — Z6826 Body mass index (BMI) 26.0-26.9, adult: Secondary | ICD-10-CM | POA: Diagnosis not present

## 2017-08-11 DIAGNOSIS — E782 Mixed hyperlipidemia: Secondary | ICD-10-CM | POA: Diagnosis not present

## 2017-08-11 DIAGNOSIS — E1122 Type 2 diabetes mellitus with diabetic chronic kidney disease: Secondary | ICD-10-CM | POA: Diagnosis not present

## 2017-08-11 DIAGNOSIS — I509 Heart failure, unspecified: Secondary | ICD-10-CM | POA: Diagnosis not present

## 2017-08-11 DIAGNOSIS — I480 Paroxysmal atrial fibrillation: Secondary | ICD-10-CM | POA: Diagnosis not present

## 2017-08-11 DIAGNOSIS — I2510 Atherosclerotic heart disease of native coronary artery without angina pectoris: Secondary | ICD-10-CM | POA: Diagnosis not present

## 2017-08-11 DIAGNOSIS — I10 Essential (primary) hypertension: Secondary | ICD-10-CM | POA: Diagnosis not present

## 2017-08-11 DIAGNOSIS — D649 Anemia, unspecified: Secondary | ICD-10-CM | POA: Diagnosis not present

## 2017-08-21 ENCOUNTER — Encounter: Payer: Self-pay | Admitting: Neurology

## 2017-09-04 ENCOUNTER — Encounter: Payer: Self-pay | Admitting: Neurology

## 2017-09-04 ENCOUNTER — Ambulatory Visit (INDEPENDENT_AMBULATORY_CARE_PROVIDER_SITE_OTHER): Payer: Medicare Other | Admitting: Neurology

## 2017-09-04 ENCOUNTER — Ambulatory Visit: Payer: Medicare Other | Admitting: Neurology

## 2017-09-04 DIAGNOSIS — E1140 Type 2 diabetes mellitus with diabetic neuropathy, unspecified: Secondary | ICD-10-CM

## 2017-09-04 DIAGNOSIS — E1142 Type 2 diabetes mellitus with diabetic polyneuropathy: Secondary | ICD-10-CM

## 2017-09-04 HISTORY — DX: Type 2 diabetes mellitus with diabetic neuropathy, unspecified: E11.40

## 2017-09-04 NOTE — Procedures (Signed)
     HISTORY:  Zachary Mcmahon is an 84 year old gentleman with a history of diabetes who noted onset of discomfort in increased numbness in the right arm in April 2018 when he was being transported to the hospital, they were trying to get blood drawn from the right arm, and he began having pain at that time.  The patient is being evaluated for a possible neuropathy or a cervical radiculopathy.  NERVE CONDUCTION STUDIES:  Nerve conduction studies were performed on both upper extremities.  The distal motor latencies for the median nerves were unobtainable on the right and significantly prolonged on the left with a low motor amplitude on the left.  The distal motor latencies for the ulnar nerves were prolonged bilaterally, more significant on the right with a low motor amplitude for the right ulnar nerve, normal for the left.  Slowing was seen for the left ulnar nerve below the elbow, normal above the elbow.  Nerve conduction velocities for the right ulnar nerve and for the left median nerve were normal.  The sensory latencies for the median and ulnar nerves were unobtainable bilaterally.  The F-wave latencies for the left median and ulnar nerves were prolonged, unobtainable for the right median and ulnar nerves.  EMG STUDIES:  EMG study was performed on the right upper extremity:  The first dorsal interosseous muscle reveals 2 to 3 K units with markedly reduced recruitment. No fibrillations or positive waves were noted. The abductor pollicis brevis muscle reveals 1 to 3 K units with markedly reduced recruitment. No fibrillations or positive waves were noted. The extensor indicis proprius muscle reveals 1 to 3 K units with decreased recruitment. No fibrillations or positive waves were noted. The pronator teres muscle reveals 2 to 3 K units with full recruitment. No fibrillations or positive waves were noted. The biceps muscle reveals 1 to 2 K units with full recruitment. No fibrillations or positive  waves were noted. The triceps muscle reveals 2 to 4 K units with full recruitment. No fibrillations or positive waves were noted. The anterior deltoid muscle reveals 2 to 3 K units with full recruitment. No fibrillations or positive waves were noted. The cervical paraspinal muscles were tested at 2 levels. No abnormalities of insertional activity were seen at either level tested. There was good relaxation.   IMPRESSION:  Nerve conduction studies done on both upper extremities shows evidence of a generalized diabetic peripheral neuropathy with involvement that is more significant on the right arm than the left with bilateral carpal tunnel syndrome and primarily right ulnar nerve involvement at the elbow.  EMG evaluation of the right upper extremity shows distal chronic stable signs of denervation consistent with the diagnosis of peripheral neuropathy.  There is no evidence of an overlying cervical radiculopathy.  Jill Alexanders MD 09/04/2017 11:28 AM  [facility] [address] [place], [place] [address]  Phone [phone] Fax [phone]

## 2017-09-04 NOTE — Progress Notes (Signed)
Please refer to EMG and nerve conduction study procedure note. 

## 2017-09-04 NOTE — Progress Notes (Signed)
[place]    Nerve / Sites Muscle Latency Ref. Amplitude Ref. Rel Amp Segments Distance Velocity Ref. Area    ms ms mV mV %  cm m/s m/s mVms  L Median - APB     Wrist APB 10.2 ?4.4 3.2 ?4.0 100 Wrist - APB 7   12.8     Upper arm APB 15.2  3.5  108 Upper arm - Wrist 25 50 ?49 13.4  R Median - APB     Wrist APB NR ?4.4 NR ?4.0 NR Wrist - APB 7   NR     Upper arm APB NR  NR  NR Upper arm - Wrist   ?49 NR  L Ulnar - ADM     Wrist ADM 3.8 ?3.3 6.1 ?6.0 100 Wrist - ADM 7   28.3     B.Elbow ADM 8.3  5.9  95.8 B.Elbow - Wrist 22 48 ?49 24.5     A.Elbow ADM 10.2  5.3  90 A.Elbow - B.Elbow 10 53 ?49 21.8         A.Elbow - Wrist      R Ulnar - ADM     Wrist ADM 7.6 ?3.3 1.2 ?6.0 100 Wrist - ADM 7   5.2     B.Elbow ADM 11.8  1.2  104 B.Elbow - Wrist 22 52 ?49 5.8     A.Elbow ADM 13.8  1.2  95.6 A.Elbow - B.Elbow 10 52 ?49 5.7         A.Elbow - Wrist                 SNC    Nerve / Sites Rec. Site Peak Lat Amp Segments Distance    ms V  cm  L Median - Orthodromic (Dig II, Mid palm)     Dig II Wrist NR NR Dig II - Wrist 13  R Median - Orthodromic (Dig II, Mid palm)     Dig II Wrist NR NR Dig II - Wrist 13  L Ulnar - Orthodromic, (Dig V, Mid palm)     Dig V Wrist NR NR Dig V - Wrist 11             F  Wave    Nerve F Lat Ref.   ms ms  L Median - APB 41.5 ?31.0  L Ulnar - ADM 36.8 ?32.0  R Median - APB NR ?31.0  R Ulnar - ADM NR ?32.0             EMG full       EMG

## 2017-09-22 ENCOUNTER — Ambulatory Visit: Payer: PPO | Admitting: Cardiovascular Disease

## 2017-10-02 DIAGNOSIS — E1122 Type 2 diabetes mellitus with diabetic chronic kidney disease: Secondary | ICD-10-CM | POA: Diagnosis not present

## 2017-10-02 DIAGNOSIS — G629 Polyneuropathy, unspecified: Secondary | ICD-10-CM | POA: Diagnosis not present

## 2017-10-08 ENCOUNTER — Other Ambulatory Visit: Payer: Self-pay | Admitting: Cardiovascular Disease

## 2017-10-31 ENCOUNTER — Ambulatory Visit: Payer: Medicare Other | Admitting: Cardiovascular Disease

## 2017-10-31 ENCOUNTER — Encounter: Payer: Self-pay | Admitting: Cardiovascular Disease

## 2017-10-31 VITALS — BP 150/70 | HR 72 | Ht 70.0 in | Wt 207.0 lb

## 2017-10-31 DIAGNOSIS — E785 Hyperlipidemia, unspecified: Secondary | ICD-10-CM | POA: Diagnosis not present

## 2017-10-31 DIAGNOSIS — I25708 Atherosclerosis of coronary artery bypass graft(s), unspecified, with other forms of angina pectoris: Secondary | ICD-10-CM

## 2017-10-31 DIAGNOSIS — Z951 Presence of aortocoronary bypass graft: Secondary | ICD-10-CM

## 2017-10-31 DIAGNOSIS — I5042 Chronic combined systolic (congestive) and diastolic (congestive) heart failure: Secondary | ICD-10-CM

## 2017-10-31 DIAGNOSIS — R600 Localized edema: Secondary | ICD-10-CM | POA: Diagnosis not present

## 2017-10-31 DIAGNOSIS — E1122 Type 2 diabetes mellitus with diabetic chronic kidney disease: Secondary | ICD-10-CM | POA: Diagnosis not present

## 2017-10-31 DIAGNOSIS — I10 Essential (primary) hypertension: Secondary | ICD-10-CM

## 2017-10-31 DIAGNOSIS — R9439 Abnormal result of other cardiovascular function study: Secondary | ICD-10-CM | POA: Diagnosis not present

## 2017-10-31 DIAGNOSIS — D649 Anemia, unspecified: Secondary | ICD-10-CM | POA: Diagnosis not present

## 2017-10-31 DIAGNOSIS — E782 Mixed hyperlipidemia: Secondary | ICD-10-CM | POA: Diagnosis not present

## 2017-10-31 NOTE — Patient Instructions (Signed)

## 2017-10-31 NOTE — Progress Notes (Signed)
SUBJECTIVE: The patient returns for follow-up of coronary artery disease and chronic systolic and diastolic heart failure. Nuclear stress test on 10/23/15 demonstrated a large defect involving the inferoseptal, inferior, and lateral wall extending from the apex to the base with some degree of reversibility suggestive of moderate lateral ischemia. It was deemed a high risk study.  Echocardiogram performed on 10/04/15 demonstrated mild to moderately reduced left ventricular systolic function, EF 74-94%, with inferior wall hypokinesis, grade 2 diastolic dysfunction , and mild to moderate mitral regurgitation.   He underwent 5 vessel coronary artery bypass graft surgery on 02/17/15. He has a history of pulmonary embolism and had been anticoagulated with Eliquis. He also has chronic kidney disease stage III. The EF had dropped and had previously been normal , 50-55%, in July 2016.  Eliquis was stopped due to recurrent nosebleeds by ENT at [facility]. ASA was also stopped.  He has a history of chronic lower extremity edema and urinary incontinence.  He takes Lasix 20-40 mg.  He denies chest pain, palpitations, orthopnea, and shortness of breath.  Primary complaints today relate to right knee pain.  He denies nosebleeds.  He has occasional dizziness only when first standing up in the morning.  He said his blood pressure was in the 132/70 range at his PCPs office.  He thinks it is elevated today due to his right knee pain.  Blood pressure is 150/70.    Review of Systems: As per "subjective", otherwise negative.  Allergies  Allergen Reactions  . Bactrim [Sulfamethoxazole-Trimethoprim] Rash  . Hydralazine Rash    Current Outpatient Medications  Medication Sig Dispense Refill  . acetaminophen (TYLENOL) 500 MG tablet Take 500 mg by mouth every 6 (six) hours as needed for mild pain.    Marland Kitchen amLODipine (NORVASC) 5 MG tablet TAKE 1 TABLET BY MOUTH DAILY 30 tablet 6  . atorvastatin (LIPITOR) 20  MG tablet Take 1 tablet by mouth daily.    . carvedilol (COREG) 6.25 MG tablet TAKE ONE TABLET BY MOUTH TWICE DAILY 60 tablet 1  . cholecalciferol (VITAMIN D) 1000 units tablet Take 1,000 Units by mouth daily.    . ferrous sulfate 325 (65 FE) MG tablet Take 1 tablet (325 mg total) by mouth daily with breakfast.  3  . furosemide (LASIX) 20 MG tablet Take 20 mg by mouth daily.    Marland Kitchen gabapentin (NEURONTIN) 100 MG capsule Take 100 mg by mouth at bedtime.    Marland Kitchen LEVEMIR FLEXTOUCH 100 UNIT/ML Pen Inject 27 Units into the skin at bedtime. (Patient taking differently: Inject 12 Units into the skin at bedtime. Depends on blood sugar)    . losartan (COZAAR) 25 MG tablet Take 25 mg by mouth daily.    Marland Kitchen omeprazole (PRILOSEC) 20 MG capsule Take 20 mg by mouth daily.    Marland Kitchen OVER THE [place] extra strength probiotic daily    . traMADol (ULTRAM) 50 MG tablet Take 50 mg by mouth every 6 (six) hours as needed for moderate pain.     No current facility-administered medications for this visit.     Past Medical History:  Diagnosis Date  . Arthritis    "legs" (02/07/2015)  . Basal cell carcinoma of forehead 2016 X 2  . Basal cell carcinoma of left earlobe   . CHF (congestive heart failure) ([place])   . Diabetic neuropathy ([place]) 09/04/2017  . Diabetic peripheral neuropathy ([place])    "left foot" (02/07/2015)  . Enterocolitis due to Clostridium  difficile, recurrent   . GERD (gastroesophageal reflux disease)   . High potassium   . History of gout X 1  . Hyperlipidemia   . Hypertension   . Old myocardial infarct    "sometime in the past; don't know when" (02/07/2015)  . Pain and swelling of left lower leg    chronic  . Pneumonia X 1  . Renal insufficiency   . Respiratory failure ([place])   . Type II diabetes mellitus ([place])   . Varicose veins     Past Surgical History:  Procedure Laterality Date  . BASAL CELL CARCINOMA EXCISION     "probably 1/2 dozen cut off face, left ear" (02/07/2015)  .  CARDIAC CATHETERIZATION N/A 02/09/2015   Procedure: Left Heart Cath and Coronary Angiography;  Surgeon: Leonie Man, MD;  Location: [place] [facility];  Service: Cardiovascular;  Laterality: N/A;  . CARDIOVASCULAR STRESS TEST  05/29/2012   Mild-moderate perfusion defect due to infarct/scar with mild-moderate perinfarct ischemia seen in the mid anterior, apical anterior, apical septal, and apical regions. No ECG changes. Global LV systolic function is severely reduced.  Marland Kitchen CATARACT EXTRACTION W/PHACO Left 11/12/2015   Procedure: CATARACT EXTRACTION PHACO AND INTRAOCULAR LENS PLACEMENT LEFT EYE;  Surgeon: Tonny Branch, MD;  Location[facility];  Service: Ophthalmology;  Laterality: Left;  CDE: 9.82  . CATARACT EXTRACTION W/PHACO Right 12/14/2015   Procedure: CATARACT EXTRACTION PHACO AND INTRAOCULAR LENS PLACEMENT (IOC);  Surgeon: Tonny Branch, MD;  Location: [facility];  Service: Ophthalmology;  Laterality: Right;  CDE: 13.86  . CORONARY ARTERY BYPASS GRAFT N/A 02/17/2015   Procedure: CORONARY ARTERY BYPASS GRAFTING (CABG), ON PUMP, TIMES FIVE, USING LEFT INTERNAL MAMMARY ARTERY, RIGHT GREATER SAPHENOUS VEIN HARVESTED ENDOSCOPICALLY;  Surgeon: [name], MD;  Location: [place];  Service: Open Heart Surgery;  Laterality: N/A;  -LIMA to LAD -SVG to DIAGONAL - SEQ SVG to OM1 and PLB -SVG to PDA  . CYSTOSCOPY W/ STONE MANIPULATION  X 1  . FRACTURE SURGERY    . KNEE ARTHROSCOPY Right ~ 2008  . LEXISCAM MYOCARDIAL PERFUSION  05/29/12   MARKED PERFUSION DEFECT DUE TO INFARC/SCAR WITH MILD PERINFARCT ISCHEMIA IN THE BASAL INFERIOR, MID INFEROSEPTAL, MID INFERIOR AND APICALINFERIOR REGION. EF%33%. PERIINFARCT ISCHEMIA IN THE MID ANTERIOR, APICAL ANTERIOR, APICAL SEPTAL AND APICAL REGIONS.  . LOWER EXTREMITY VENOUS DOPPLER  07/11/2012   No evidence of DVT in the left lower extremity. Evidence of partially recanalized, chronic, non-obstructive thrombus in the left great SV and its branches consistent with significant  reflux consistent with post-phlebitic syndrome. Significant reflux of the left short saphenous vein.  Marland Kitchen open heart sx  02/17/15  . ORIF HIP FRACTURE  03/20/2012   Procedure: OPEN REDUCTION INTERNAL FIXATION HIP;  Surgeon: Sanjuana Kava, MD;  Location: [facility];  Service: Orthopedics;  Laterality: Right;  . TEE WITHOUT CARDIOVERSION N/A 02/17/2015   Procedure: TRANSESOPHAGEAL ECHOCARDIOGRAM (TEE);  Surgeon: [name], MD;  Location: [place];  Service: Open Heart Surgery;  Laterality: N/A;  . TRANSTHORACIC ECHOCARDIOGRAM  04/18/2012   EF 45%, mild-moderate LVH  . TRANSTHORACIC ECHOCARDIOGRAM  04/18/12   EF% 45%.SEVERE HYPOKINESIS TO AKINESIS OF THE MID-DISTAL INFEROLATERAL MYOCARDIUM AND MUCH OF THE APEX.    Social History   Socioeconomic History  . Marital status: Married    Spouse name: Not on file  . Number of children: Not on file  . Years of education: Not on file  . Highest education level: Not on file  Social Needs  .  Financial resource strain: Not on file  . Food insecurity - worry: Not on file  . Food insecurity - inability: Not on file  . Transportation needs - medical: Not on file  . Transportation needs - non-medical: Not on file  Occupational History  . Not on file  Tobacco Use  . Smoking status: Never Smoker  . Smokeless tobacco: Never Used  Substance and Sexual Activity  . Alcohol use: No    Alcohol/week: 0.0 oz  . Drug use: No  . Sexual activity: Not Currently  Other Topics Concern  . Not on file  Social History Narrative  . Not on file     Vitals:   10/31/17 1133  BP: (!) 150/70  Pulse: 72  SpO2: 96%  Weight: 207 lb (93.9 kg)  Height: 5\' 10"  (1.778 m)    Wt Readings from Last 3 Encounters:  10/31/17 207 lb (93.9 kg)  05/03/17 200 lb (90.7 kg)  03/01/17 180 lb (81.6 kg)     PHYSICAL EXAM General: NAD HEENT: Normal. Neck: No JVD, no thyromegaly. Lungs: Clear to auscultation bilaterally with normal respiratory effort. CV: Regular rate and  rhythm, normal S1/S2, no S3/S4, 3/6 apical holosystolicmurmur. Trace b/lpretibial edema.  Wearing compression stockings. Abdomen: Soft, nontender, no distention.  Neurologic: Alert and oriented.  Psych: Normal affect. Skin: Normal. Musculoskeletal: No gross deformities.    ECG: Most recent ECG reviewed.   Labs: Lab Results  Component Value Date/Time   K 4.1 12/30/2016 04:31 PM   BUN 39 (H) 12/30/2016 04:31 PM   CREATININE 1.48 (H) 12/30/2016 04:31 PM   CREATININE 1.45 (H) 05/15/2015 11:47 AM   ALT 15 (L) 12/20/2016 02:15 PM   TSH 1.364 02/07/2015 12:47 AM   TSH 1.634 03/20/2012 08:20 AM   HGB 10.5 (L) 12/30/2016 04:07 PM     Lipids: Lab Results  Component Value Date/Time   LDLCALC 48 02/07/2015 05:33 AM   CHOL 83 02/07/2015 05:33 AM   TRIG 91 02/07/2015 05:33 AM   HDL 17 (L) 02/07/2015 05:33 AM       ASSESSMENT AND PLAN: 1. Chronic combined systolic and diastolic heart failure:  Symptomatically stable.  Continue Lasix and Coreg. He prefers taking Lasix 20-40 mg only every morning due to urinary incontinence.  2. CAD s/p 5-v CABG: On Lipitor. No longer on ASA. Lexiscan Cardiolite indicative of ischemic etiology for EF reduction. Continue Coreg. We previously discussed coronary angiography and he didnot want to proceed.  3. Pulmonary embolism: Occurred in post-op state. Will hold off on Eliquis altogether.  4. Essential HTN: Mildly elevated. Hydralazineled to a rash. He said his blood pressure was in the 132/70 range at his PCPs office.  He thinks it is elevated today due to his right knee pain.  Blood pressure is 150/70.  I will monitor.  5. Hyperlipidemia: Continue Lipitor.  6. Tachycardia: Resolved with Coreg.  7. Chronic venous stasis with ulceration: Stable.     Disposition: Follow up 6 months   Kate Sable, M.D., F.A.C.C.

## 2017-11-03 DIAGNOSIS — E782 Mixed hyperlipidemia: Secondary | ICD-10-CM | POA: Diagnosis not present

## 2017-11-03 DIAGNOSIS — I480 Paroxysmal atrial fibrillation: Secondary | ICD-10-CM | POA: Diagnosis not present

## 2017-11-03 DIAGNOSIS — E1122 Type 2 diabetes mellitus with diabetic chronic kidney disease: Secondary | ICD-10-CM | POA: Diagnosis not present

## 2017-11-03 DIAGNOSIS — I2510 Atherosclerotic heart disease of native coronary artery without angina pectoris: Secondary | ICD-10-CM | POA: Diagnosis not present

## 2017-11-03 DIAGNOSIS — I10 Essential (primary) hypertension: Secondary | ICD-10-CM | POA: Diagnosis not present

## 2017-11-10 DIAGNOSIS — E119 Type 2 diabetes mellitus without complications: Secondary | ICD-10-CM | POA: Diagnosis not present

## 2017-11-14 DIAGNOSIS — L299 Pruritus, unspecified: Secondary | ICD-10-CM | POA: Diagnosis not present

## 2017-11-21 ENCOUNTER — Telehealth: Payer: Self-pay | Admitting: Orthopedic Surgery

## 2017-11-21 NOTE — Telephone Encounter (Signed)
Zachary Mcmahon called and canceled his appointment with Dr. Aline Brochure this coming Friday, 11/24/17.  He states he has another medical issue going on right now and needs to take care of that first.  He will call back to reschedule this appointment.

## 2017-11-24 ENCOUNTER — Ambulatory Visit: Payer: Self-pay | Admitting: Orthopedic Surgery

## 2017-11-27 DIAGNOSIS — I2510 Atherosclerotic heart disease of native coronary artery without angina pectoris: Secondary | ICD-10-CM | POA: Diagnosis not present

## 2017-11-27 DIAGNOSIS — E1122 Type 2 diabetes mellitus with diabetic chronic kidney disease: Secondary | ICD-10-CM | POA: Diagnosis not present

## 2017-11-27 DIAGNOSIS — M6281 Muscle weakness (generalized): Secondary | ICD-10-CM | POA: Diagnosis not present

## 2017-11-27 DIAGNOSIS — Z79899 Other long term (current) drug therapy: Secondary | ICD-10-CM | POA: Diagnosis not present

## 2017-11-27 DIAGNOSIS — R279 Unspecified lack of coordination: Secondary | ICD-10-CM | POA: Diagnosis not present

## 2017-11-27 DIAGNOSIS — Z794 Long term (current) use of insulin: Secondary | ICD-10-CM | POA: Diagnosis not present

## 2017-11-27 DIAGNOSIS — I5031 Acute diastolic (congestive) heart failure: Secondary | ICD-10-CM | POA: Diagnosis not present

## 2017-11-27 DIAGNOSIS — Z882 Allergy status to sulfonamides status: Secondary | ICD-10-CM | POA: Diagnosis not present

## 2017-11-27 DIAGNOSIS — Z951 Presence of aortocoronary bypass graft: Secondary | ICD-10-CM | POA: Diagnosis not present

## 2017-11-27 DIAGNOSIS — E1140 Type 2 diabetes mellitus with diabetic neuropathy, unspecified: Secondary | ICD-10-CM | POA: Diagnosis not present

## 2017-11-27 DIAGNOSIS — I5021 Acute systolic (congestive) heart failure: Secondary | ICD-10-CM | POA: Diagnosis not present

## 2017-11-27 DIAGNOSIS — Z7401 Bed confinement status: Secondary | ICD-10-CM | POA: Diagnosis not present

## 2017-11-27 DIAGNOSIS — E1129 Type 2 diabetes mellitus with other diabetic kidney complication: Secondary | ICD-10-CM | POA: Diagnosis not present

## 2017-11-27 DIAGNOSIS — I10 Essential (primary) hypertension: Secondary | ICD-10-CM | POA: Diagnosis not present

## 2017-11-27 DIAGNOSIS — N183 Chronic kidney disease, stage 3 (moderate): Secondary | ICD-10-CM | POA: Diagnosis not present

## 2017-11-27 DIAGNOSIS — I509 Heart failure, unspecified: Secondary | ICD-10-CM | POA: Diagnosis not present

## 2017-11-27 DIAGNOSIS — Z7901 Long term (current) use of anticoagulants: Secondary | ICD-10-CM | POA: Diagnosis not present

## 2017-11-27 DIAGNOSIS — D649 Anemia, unspecified: Secondary | ICD-10-CM | POA: Diagnosis not present

## 2017-11-27 DIAGNOSIS — I130 Hypertensive heart and chronic kidney disease with heart failure and stage 1 through stage 4 chronic kidney disease, or unspecified chronic kidney disease: Secondary | ICD-10-CM | POA: Diagnosis not present

## 2017-11-27 DIAGNOSIS — R609 Edema, unspecified: Secondary | ICD-10-CM | POA: Diagnosis not present

## 2017-11-27 DIAGNOSIS — E11649 Type 2 diabetes mellitus with hypoglycemia without coma: Secondary | ICD-10-CM | POA: Diagnosis not present

## 2017-11-27 DIAGNOSIS — R2689 Other abnormalities of gait and mobility: Secondary | ICD-10-CM | POA: Diagnosis not present

## 2017-11-27 DIAGNOSIS — K4090 Unilateral inguinal hernia, without obstruction or gangrene, not specified as recurrent: Secondary | ICD-10-CM | POA: Diagnosis not present

## 2017-11-27 DIAGNOSIS — J441 Chronic obstructive pulmonary disease with (acute) exacerbation: Secondary | ICD-10-CM | POA: Diagnosis not present

## 2017-11-27 DIAGNOSIS — I5032 Chronic diastolic (congestive) heart failure: Secondary | ICD-10-CM | POA: Diagnosis not present

## 2017-11-27 DIAGNOSIS — K7469 Other cirrhosis of liver: Secondary | ICD-10-CM | POA: Diagnosis not present

## 2017-11-27 DIAGNOSIS — I872 Venous insufficiency (chronic) (peripheral): Secondary | ICD-10-CM | POA: Diagnosis not present

## 2017-11-27 DIAGNOSIS — E119 Type 2 diabetes mellitus without complications: Secondary | ICD-10-CM | POA: Diagnosis not present

## 2017-11-27 DIAGNOSIS — E7800 Pure hypercholesterolemia, unspecified: Secondary | ICD-10-CM | POA: Diagnosis not present

## 2017-11-27 DIAGNOSIS — R601 Generalized edema: Secondary | ICD-10-CM | POA: Diagnosis not present

## 2017-11-27 DIAGNOSIS — I5023 Acute on chronic systolic (congestive) heart failure: Secondary | ICD-10-CM | POA: Diagnosis not present

## 2017-11-27 DIAGNOSIS — N170 Acute kidney failure with tubular necrosis: Secondary | ICD-10-CM | POA: Diagnosis not present

## 2017-11-27 DIAGNOSIS — S161XXA Strain of muscle, fascia and tendon at neck level, initial encounter: Secondary | ICD-10-CM | POA: Diagnosis not present

## 2017-11-27 DIAGNOSIS — K219 Gastro-esophageal reflux disease without esophagitis: Secondary | ICD-10-CM | POA: Diagnosis not present

## 2017-11-27 DIAGNOSIS — N184 Chronic kidney disease, stage 4 (severe): Secondary | ICD-10-CM | POA: Diagnosis not present

## 2017-11-27 DIAGNOSIS — Z888 Allergy status to other drugs, medicaments and biological substances status: Secondary | ICD-10-CM | POA: Diagnosis not present

## 2017-11-27 DIAGNOSIS — K458 Other specified abdominal hernia without obstruction or gangrene: Secondary | ICD-10-CM | POA: Diagnosis not present

## 2017-11-27 DIAGNOSIS — R0602 Shortness of breath: Secondary | ICD-10-CM | POA: Diagnosis not present

## 2017-11-27 DIAGNOSIS — N185 Chronic kidney disease, stage 5: Secondary | ICD-10-CM | POA: Diagnosis not present

## 2017-11-27 DIAGNOSIS — R7989 Other specified abnormal findings of blood chemistry: Secondary | ICD-10-CM | POA: Diagnosis not present

## 2017-11-27 DIAGNOSIS — I110 Hypertensive heart disease with heart failure: Secondary | ICD-10-CM | POA: Diagnosis not present

## 2017-11-27 DIAGNOSIS — N186 End stage renal disease: Secondary | ICD-10-CM | POA: Diagnosis not present

## 2017-12-04 DIAGNOSIS — E119 Type 2 diabetes mellitus without complications: Secondary | ICD-10-CM | POA: Diagnosis not present

## 2017-12-04 DIAGNOSIS — N185 Chronic kidney disease, stage 5: Secondary | ICD-10-CM | POA: Diagnosis not present

## 2017-12-04 DIAGNOSIS — D649 Anemia, unspecified: Secondary | ICD-10-CM | POA: Diagnosis not present

## 2017-12-04 DIAGNOSIS — K219 Gastro-esophageal reflux disease without esophagitis: Secondary | ICD-10-CM | POA: Diagnosis not present

## 2017-12-04 DIAGNOSIS — R279 Unspecified lack of coordination: Secondary | ICD-10-CM | POA: Diagnosis not present

## 2017-12-04 DIAGNOSIS — I110 Hypertensive heart disease with heart failure: Secondary | ICD-10-CM | POA: Diagnosis not present

## 2017-12-04 DIAGNOSIS — I10 Essential (primary) hypertension: Secondary | ICD-10-CM | POA: Diagnosis not present

## 2017-12-04 DIAGNOSIS — K4090 Unilateral inguinal hernia, without obstruction or gangrene, not specified as recurrent: Secondary | ICD-10-CM | POA: Diagnosis not present

## 2017-12-04 DIAGNOSIS — Z794 Long term (current) use of insulin: Secondary | ICD-10-CM | POA: Diagnosis not present

## 2017-12-04 DIAGNOSIS — I5032 Chronic diastolic (congestive) heart failure: Secondary | ICD-10-CM | POA: Diagnosis not present

## 2017-12-04 DIAGNOSIS — I872 Venous insufficiency (chronic) (peripheral): Secondary | ICD-10-CM | POA: Diagnosis not present

## 2017-12-04 DIAGNOSIS — E1129 Type 2 diabetes mellitus with other diabetic kidney complication: Secondary | ICD-10-CM | POA: Diagnosis not present

## 2017-12-04 DIAGNOSIS — R2689 Other abnormalities of gait and mobility: Secondary | ICD-10-CM | POA: Diagnosis not present

## 2017-12-04 DIAGNOSIS — S161XXA Strain of muscle, fascia and tendon at neck level, initial encounter: Secondary | ICD-10-CM | POA: Diagnosis not present

## 2017-12-04 DIAGNOSIS — K7469 Other cirrhosis of liver: Secondary | ICD-10-CM | POA: Diagnosis not present

## 2017-12-04 DIAGNOSIS — K458 Other specified abdominal hernia without obstruction or gangrene: Secondary | ICD-10-CM | POA: Diagnosis not present

## 2017-12-04 DIAGNOSIS — N183 Chronic kidney disease, stage 3 (moderate): Secondary | ICD-10-CM | POA: Diagnosis not present

## 2017-12-04 DIAGNOSIS — R601 Generalized edema: Secondary | ICD-10-CM | POA: Diagnosis not present

## 2017-12-04 DIAGNOSIS — M6281 Muscle weakness (generalized): Secondary | ICD-10-CM | POA: Diagnosis not present

## 2017-12-04 DIAGNOSIS — Z7401 Bed confinement status: Secondary | ICD-10-CM | POA: Diagnosis not present

## 2017-12-04 DIAGNOSIS — R7989 Other specified abnormal findings of blood chemistry: Secondary | ICD-10-CM | POA: Diagnosis not present

## 2017-12-04 DIAGNOSIS — I509 Heart failure, unspecified: Secondary | ICD-10-CM | POA: Diagnosis not present

## 2017-12-04 DIAGNOSIS — J441 Chronic obstructive pulmonary disease with (acute) exacerbation: Secondary | ICD-10-CM | POA: Diagnosis not present

## 2017-12-05 ENCOUNTER — Other Ambulatory Visit: Payer: Self-pay | Admitting: Cardiovascular Disease

## 2017-12-25 DIAGNOSIS — Z794 Long term (current) use of insulin: Secondary | ICD-10-CM | POA: Diagnosis not present

## 2017-12-25 DIAGNOSIS — N185 Chronic kidney disease, stage 5: Secondary | ICD-10-CM | POA: Diagnosis not present

## 2017-12-25 DIAGNOSIS — R2689 Other abnormalities of gait and mobility: Secondary | ICD-10-CM | POA: Diagnosis not present

## 2017-12-25 DIAGNOSIS — K4090 Unilateral inguinal hernia, without obstruction or gangrene, not specified as recurrent: Secondary | ICD-10-CM | POA: Diagnosis not present

## 2017-12-25 DIAGNOSIS — R944 Abnormal results of kidney function studies: Secondary | ICD-10-CM | POA: Diagnosis not present

## 2017-12-25 DIAGNOSIS — K8020 Calculus of gallbladder without cholecystitis without obstruction: Secondary | ICD-10-CM | POA: Diagnosis not present

## 2017-12-25 DIAGNOSIS — K7460 Unspecified cirrhosis of liver: Secondary | ICD-10-CM | POA: Diagnosis not present

## 2017-12-25 DIAGNOSIS — I2510 Atherosclerotic heart disease of native coronary artery without angina pectoris: Secondary | ICD-10-CM | POA: Diagnosis not present

## 2017-12-25 DIAGNOSIS — I509 Heart failure, unspecified: Secondary | ICD-10-CM | POA: Diagnosis not present

## 2017-12-25 DIAGNOSIS — Z951 Presence of aortocoronary bypass graft: Secondary | ICD-10-CM | POA: Diagnosis not present

## 2017-12-25 DIAGNOSIS — Z9181 History of falling: Secondary | ICD-10-CM | POA: Diagnosis not present

## 2017-12-25 DIAGNOSIS — I5020 Unspecified systolic (congestive) heart failure: Secondary | ICD-10-CM | POA: Diagnosis not present

## 2017-12-25 DIAGNOSIS — I872 Venous insufficiency (chronic) (peripheral): Secondary | ICD-10-CM | POA: Diagnosis not present

## 2017-12-25 DIAGNOSIS — M6281 Muscle weakness (generalized): Secondary | ICD-10-CM | POA: Diagnosis not present

## 2017-12-25 DIAGNOSIS — I10 Essential (primary) hypertension: Secondary | ICD-10-CM | POA: Diagnosis not present

## 2017-12-25 DIAGNOSIS — E1122 Type 2 diabetes mellitus with diabetic chronic kidney disease: Secondary | ICD-10-CM | POA: Diagnosis not present

## 2017-12-26 DIAGNOSIS — D509 Iron deficiency anemia, unspecified: Secondary | ICD-10-CM | POA: Diagnosis not present

## 2017-12-26 DIAGNOSIS — N183 Chronic kidney disease, stage 3 (moderate): Secondary | ICD-10-CM | POA: Diagnosis not present

## 2017-12-26 DIAGNOSIS — I129 Hypertensive chronic kidney disease with stage 1 through stage 4 chronic kidney disease, or unspecified chronic kidney disease: Secondary | ICD-10-CM | POA: Diagnosis not present

## 2017-12-26 DIAGNOSIS — M6281 Muscle weakness (generalized): Secondary | ICD-10-CM | POA: Diagnosis not present

## 2017-12-26 DIAGNOSIS — R2689 Other abnormalities of gait and mobility: Secondary | ICD-10-CM | POA: Diagnosis not present

## 2017-12-26 DIAGNOSIS — K8020 Calculus of gallbladder without cholecystitis without obstruction: Secondary | ICD-10-CM | POA: Diagnosis not present

## 2017-12-26 DIAGNOSIS — N185 Chronic kidney disease, stage 5: Secondary | ICD-10-CM | POA: Diagnosis not present

## 2017-12-26 DIAGNOSIS — Z794 Long term (current) use of insulin: Secondary | ICD-10-CM | POA: Diagnosis not present

## 2017-12-26 DIAGNOSIS — E559 Vitamin D deficiency, unspecified: Secondary | ICD-10-CM | POA: Diagnosis not present

## 2017-12-26 DIAGNOSIS — Z951 Presence of aortocoronary bypass graft: Secondary | ICD-10-CM | POA: Diagnosis not present

## 2017-12-26 DIAGNOSIS — Z9181 History of falling: Secondary | ICD-10-CM | POA: Diagnosis not present

## 2017-12-26 DIAGNOSIS — E1122 Type 2 diabetes mellitus with diabetic chronic kidney disease: Secondary | ICD-10-CM | POA: Diagnosis not present

## 2017-12-26 DIAGNOSIS — I5020 Unspecified systolic (congestive) heart failure: Secondary | ICD-10-CM | POA: Diagnosis not present

## 2017-12-26 DIAGNOSIS — K7460 Unspecified cirrhosis of liver: Secondary | ICD-10-CM | POA: Diagnosis not present

## 2017-12-26 DIAGNOSIS — Z79899 Other long term (current) drug therapy: Secondary | ICD-10-CM | POA: Diagnosis not present

## 2017-12-26 DIAGNOSIS — K4090 Unilateral inguinal hernia, without obstruction or gangrene, not specified as recurrent: Secondary | ICD-10-CM | POA: Diagnosis not present

## 2017-12-26 DIAGNOSIS — I872 Venous insufficiency (chronic) (peripheral): Secondary | ICD-10-CM | POA: Diagnosis not present

## 2017-12-27 DIAGNOSIS — D638 Anemia in other chronic diseases classified elsewhere: Secondary | ICD-10-CM | POA: Diagnosis not present

## 2017-12-27 DIAGNOSIS — I509 Heart failure, unspecified: Secondary | ICD-10-CM | POA: Diagnosis not present

## 2017-12-27 DIAGNOSIS — E1122 Type 2 diabetes mellitus with diabetic chronic kidney disease: Secondary | ICD-10-CM | POA: Diagnosis not present

## 2017-12-27 DIAGNOSIS — N184 Chronic kidney disease, stage 4 (severe): Secondary | ICD-10-CM | POA: Diagnosis not present

## 2017-12-27 DIAGNOSIS — I2510 Atherosclerotic heart disease of native coronary artery without angina pectoris: Secondary | ICD-10-CM | POA: Diagnosis not present

## 2017-12-29 DIAGNOSIS — N185 Chronic kidney disease, stage 5: Secondary | ICD-10-CM | POA: Diagnosis not present

## 2017-12-29 DIAGNOSIS — M6281 Muscle weakness (generalized): Secondary | ICD-10-CM | POA: Diagnosis not present

## 2017-12-29 DIAGNOSIS — R2689 Other abnormalities of gait and mobility: Secondary | ICD-10-CM | POA: Diagnosis not present

## 2017-12-29 DIAGNOSIS — I5020 Unspecified systolic (congestive) heart failure: Secondary | ICD-10-CM | POA: Diagnosis not present

## 2017-12-29 DIAGNOSIS — E1122 Type 2 diabetes mellitus with diabetic chronic kidney disease: Secondary | ICD-10-CM | POA: Diagnosis not present

## 2017-12-29 DIAGNOSIS — I872 Venous insufficiency (chronic) (peripheral): Secondary | ICD-10-CM | POA: Diagnosis not present

## 2017-12-29 DIAGNOSIS — Z9181 History of falling: Secondary | ICD-10-CM | POA: Diagnosis not present

## 2017-12-29 DIAGNOSIS — K4090 Unilateral inguinal hernia, without obstruction or gangrene, not specified as recurrent: Secondary | ICD-10-CM | POA: Diagnosis not present

## 2017-12-29 DIAGNOSIS — Z794 Long term (current) use of insulin: Secondary | ICD-10-CM | POA: Diagnosis not present

## 2017-12-29 DIAGNOSIS — Z951 Presence of aortocoronary bypass graft: Secondary | ICD-10-CM | POA: Diagnosis not present

## 2017-12-29 DIAGNOSIS — K7460 Unspecified cirrhosis of liver: Secondary | ICD-10-CM | POA: Diagnosis not present

## 2017-12-29 DIAGNOSIS — K8020 Calculus of gallbladder without cholecystitis without obstruction: Secondary | ICD-10-CM | POA: Diagnosis not present

## 2017-12-31 DIAGNOSIS — K7460 Unspecified cirrhosis of liver: Secondary | ICD-10-CM | POA: Diagnosis not present

## 2017-12-31 DIAGNOSIS — I5020 Unspecified systolic (congestive) heart failure: Secondary | ICD-10-CM | POA: Diagnosis not present

## 2017-12-31 DIAGNOSIS — Z951 Presence of aortocoronary bypass graft: Secondary | ICD-10-CM | POA: Diagnosis not present

## 2017-12-31 DIAGNOSIS — M6281 Muscle weakness (generalized): Secondary | ICD-10-CM | POA: Diagnosis not present

## 2017-12-31 DIAGNOSIS — E1122 Type 2 diabetes mellitus with diabetic chronic kidney disease: Secondary | ICD-10-CM | POA: Diagnosis not present

## 2017-12-31 DIAGNOSIS — N185 Chronic kidney disease, stage 5: Secondary | ICD-10-CM | POA: Diagnosis not present

## 2017-12-31 DIAGNOSIS — Z9181 History of falling: Secondary | ICD-10-CM | POA: Diagnosis not present

## 2017-12-31 DIAGNOSIS — R2689 Other abnormalities of gait and mobility: Secondary | ICD-10-CM | POA: Diagnosis not present

## 2017-12-31 DIAGNOSIS — I872 Venous insufficiency (chronic) (peripheral): Secondary | ICD-10-CM | POA: Diagnosis not present

## 2017-12-31 DIAGNOSIS — K4090 Unilateral inguinal hernia, without obstruction or gangrene, not specified as recurrent: Secondary | ICD-10-CM | POA: Diagnosis not present

## 2017-12-31 DIAGNOSIS — Z794 Long term (current) use of insulin: Secondary | ICD-10-CM | POA: Diagnosis not present

## 2017-12-31 DIAGNOSIS — K8020 Calculus of gallbladder without cholecystitis without obstruction: Secondary | ICD-10-CM | POA: Diagnosis not present

## 2018-01-02 DIAGNOSIS — I872 Venous insufficiency (chronic) (peripheral): Secondary | ICD-10-CM | POA: Diagnosis not present

## 2018-01-02 DIAGNOSIS — N185 Chronic kidney disease, stage 5: Secondary | ICD-10-CM | POA: Diagnosis not present

## 2018-01-02 DIAGNOSIS — K4090 Unilateral inguinal hernia, without obstruction or gangrene, not specified as recurrent: Secondary | ICD-10-CM | POA: Diagnosis not present

## 2018-01-02 DIAGNOSIS — K7460 Unspecified cirrhosis of liver: Secondary | ICD-10-CM | POA: Diagnosis not present

## 2018-01-02 DIAGNOSIS — Z794 Long term (current) use of insulin: Secondary | ICD-10-CM | POA: Diagnosis not present

## 2018-01-02 DIAGNOSIS — I5020 Unspecified systolic (congestive) heart failure: Secondary | ICD-10-CM | POA: Diagnosis not present

## 2018-01-02 DIAGNOSIS — E1122 Type 2 diabetes mellitus with diabetic chronic kidney disease: Secondary | ICD-10-CM | POA: Diagnosis not present

## 2018-01-02 DIAGNOSIS — Z951 Presence of aortocoronary bypass graft: Secondary | ICD-10-CM | POA: Diagnosis not present

## 2018-01-02 DIAGNOSIS — R2689 Other abnormalities of gait and mobility: Secondary | ICD-10-CM | POA: Diagnosis not present

## 2018-01-02 DIAGNOSIS — Z9181 History of falling: Secondary | ICD-10-CM | POA: Diagnosis not present

## 2018-01-02 DIAGNOSIS — K8020 Calculus of gallbladder without cholecystitis without obstruction: Secondary | ICD-10-CM | POA: Diagnosis not present

## 2018-01-02 DIAGNOSIS — M6281 Muscle weakness (generalized): Secondary | ICD-10-CM | POA: Diagnosis not present

## 2018-01-03 DIAGNOSIS — I129 Hypertensive chronic kidney disease with stage 1 through stage 4 chronic kidney disease, or unspecified chronic kidney disease: Secondary | ICD-10-CM | POA: Diagnosis not present

## 2018-01-03 DIAGNOSIS — I509 Heart failure, unspecified: Secondary | ICD-10-CM | POA: Diagnosis not present

## 2018-01-03 DIAGNOSIS — A0471 Enterocolitis due to Clostridium difficile, recurrent: Secondary | ICD-10-CM | POA: Diagnosis not present

## 2018-01-04 DIAGNOSIS — K7460 Unspecified cirrhosis of liver: Secondary | ICD-10-CM | POA: Diagnosis not present

## 2018-01-04 DIAGNOSIS — Z9181 History of falling: Secondary | ICD-10-CM | POA: Diagnosis not present

## 2018-01-04 DIAGNOSIS — N185 Chronic kidney disease, stage 5: Secondary | ICD-10-CM | POA: Diagnosis not present

## 2018-01-04 DIAGNOSIS — E1122 Type 2 diabetes mellitus with diabetic chronic kidney disease: Secondary | ICD-10-CM | POA: Diagnosis not present

## 2018-01-04 DIAGNOSIS — M6281 Muscle weakness (generalized): Secondary | ICD-10-CM | POA: Diagnosis not present

## 2018-01-04 DIAGNOSIS — Z951 Presence of aortocoronary bypass graft: Secondary | ICD-10-CM | POA: Diagnosis not present

## 2018-01-04 DIAGNOSIS — I5020 Unspecified systolic (congestive) heart failure: Secondary | ICD-10-CM | POA: Diagnosis not present

## 2018-01-04 DIAGNOSIS — R2689 Other abnormalities of gait and mobility: Secondary | ICD-10-CM | POA: Diagnosis not present

## 2018-01-04 DIAGNOSIS — K8020 Calculus of gallbladder without cholecystitis without obstruction: Secondary | ICD-10-CM | POA: Diagnosis not present

## 2018-01-04 DIAGNOSIS — Z794 Long term (current) use of insulin: Secondary | ICD-10-CM | POA: Diagnosis not present

## 2018-01-04 DIAGNOSIS — I872 Venous insufficiency (chronic) (peripheral): Secondary | ICD-10-CM | POA: Diagnosis not present

## 2018-01-04 DIAGNOSIS — K4090 Unilateral inguinal hernia, without obstruction or gangrene, not specified as recurrent: Secondary | ICD-10-CM | POA: Diagnosis not present

## 2018-01-05 DIAGNOSIS — M6281 Muscle weakness (generalized): Secondary | ICD-10-CM | POA: Diagnosis not present

## 2018-01-05 DIAGNOSIS — N185 Chronic kidney disease, stage 5: Secondary | ICD-10-CM | POA: Diagnosis not present

## 2018-01-05 DIAGNOSIS — Z951 Presence of aortocoronary bypass graft: Secondary | ICD-10-CM | POA: Diagnosis not present

## 2018-01-05 DIAGNOSIS — E1122 Type 2 diabetes mellitus with diabetic chronic kidney disease: Secondary | ICD-10-CM | POA: Diagnosis not present

## 2018-01-05 DIAGNOSIS — Z9181 History of falling: Secondary | ICD-10-CM | POA: Diagnosis not present

## 2018-01-05 DIAGNOSIS — I5020 Unspecified systolic (congestive) heart failure: Secondary | ICD-10-CM | POA: Diagnosis not present

## 2018-01-05 DIAGNOSIS — I872 Venous insufficiency (chronic) (peripheral): Secondary | ICD-10-CM | POA: Diagnosis not present

## 2018-01-05 DIAGNOSIS — R2689 Other abnormalities of gait and mobility: Secondary | ICD-10-CM | POA: Diagnosis not present

## 2018-01-05 DIAGNOSIS — K4090 Unilateral inguinal hernia, without obstruction or gangrene, not specified as recurrent: Secondary | ICD-10-CM | POA: Diagnosis not present

## 2018-01-05 DIAGNOSIS — K8020 Calculus of gallbladder without cholecystitis without obstruction: Secondary | ICD-10-CM | POA: Diagnosis not present

## 2018-01-05 DIAGNOSIS — K7460 Unspecified cirrhosis of liver: Secondary | ICD-10-CM | POA: Diagnosis not present

## 2018-01-05 DIAGNOSIS — Z794 Long term (current) use of insulin: Secondary | ICD-10-CM | POA: Diagnosis not present

## 2018-01-08 DIAGNOSIS — I480 Paroxysmal atrial fibrillation: Secondary | ICD-10-CM | POA: Diagnosis not present

## 2018-01-08 DIAGNOSIS — N185 Chronic kidney disease, stage 5: Secondary | ICD-10-CM | POA: Diagnosis not present

## 2018-01-08 DIAGNOSIS — Z9181 History of falling: Secondary | ICD-10-CM | POA: Diagnosis not present

## 2018-01-08 DIAGNOSIS — L299 Pruritus, unspecified: Secondary | ICD-10-CM | POA: Diagnosis not present

## 2018-01-08 DIAGNOSIS — K8020 Calculus of gallbladder without cholecystitis without obstruction: Secondary | ICD-10-CM | POA: Diagnosis not present

## 2018-01-08 DIAGNOSIS — Z794 Long term (current) use of insulin: Secondary | ICD-10-CM | POA: Diagnosis not present

## 2018-01-08 DIAGNOSIS — I872 Venous insufficiency (chronic) (peripheral): Secondary | ICD-10-CM | POA: Diagnosis not present

## 2018-01-08 DIAGNOSIS — Z951 Presence of aortocoronary bypass graft: Secondary | ICD-10-CM | POA: Diagnosis not present

## 2018-01-08 DIAGNOSIS — I87319 Chronic venous hypertension (idiopathic) with ulcer of unspecified lower extremity: Secondary | ICD-10-CM | POA: Diagnosis not present

## 2018-01-08 DIAGNOSIS — I5020 Unspecified systolic (congestive) heart failure: Secondary | ICD-10-CM | POA: Diagnosis not present

## 2018-01-08 DIAGNOSIS — K7460 Unspecified cirrhosis of liver: Secondary | ICD-10-CM | POA: Diagnosis not present

## 2018-01-08 DIAGNOSIS — R2689 Other abnormalities of gait and mobility: Secondary | ICD-10-CM | POA: Diagnosis not present

## 2018-01-08 DIAGNOSIS — E1122 Type 2 diabetes mellitus with diabetic chronic kidney disease: Secondary | ICD-10-CM | POA: Diagnosis not present

## 2018-01-08 DIAGNOSIS — K4090 Unilateral inguinal hernia, without obstruction or gangrene, not specified as recurrent: Secondary | ICD-10-CM | POA: Diagnosis not present

## 2018-01-08 DIAGNOSIS — M6281 Muscle weakness (generalized): Secondary | ICD-10-CM | POA: Diagnosis not present

## 2018-01-09 DIAGNOSIS — I501 Left ventricular failure: Secondary | ICD-10-CM | POA: Diagnosis not present

## 2018-01-09 DIAGNOSIS — I10 Essential (primary) hypertension: Secondary | ICD-10-CM | POA: Diagnosis not present

## 2018-01-09 DIAGNOSIS — N183 Chronic kidney disease, stage 3 (moderate): Secondary | ICD-10-CM | POA: Diagnosis not present

## 2018-01-09 DIAGNOSIS — E1121 Type 2 diabetes mellitus with diabetic nephropathy: Secondary | ICD-10-CM | POA: Diagnosis not present

## 2018-01-10 DIAGNOSIS — Z951 Presence of aortocoronary bypass graft: Secondary | ICD-10-CM | POA: Diagnosis not present

## 2018-01-10 DIAGNOSIS — M6281 Muscle weakness (generalized): Secondary | ICD-10-CM | POA: Diagnosis not present

## 2018-01-10 DIAGNOSIS — R2689 Other abnormalities of gait and mobility: Secondary | ICD-10-CM | POA: Diagnosis not present

## 2018-01-10 DIAGNOSIS — E1122 Type 2 diabetes mellitus with diabetic chronic kidney disease: Secondary | ICD-10-CM | POA: Diagnosis not present

## 2018-01-10 DIAGNOSIS — K4090 Unilateral inguinal hernia, without obstruction or gangrene, not specified as recurrent: Secondary | ICD-10-CM | POA: Diagnosis not present

## 2018-01-10 DIAGNOSIS — K7460 Unspecified cirrhosis of liver: Secondary | ICD-10-CM | POA: Diagnosis not present

## 2018-01-10 DIAGNOSIS — Z794 Long term (current) use of insulin: Secondary | ICD-10-CM | POA: Diagnosis not present

## 2018-01-10 DIAGNOSIS — K8020 Calculus of gallbladder without cholecystitis without obstruction: Secondary | ICD-10-CM | POA: Diagnosis not present

## 2018-01-10 DIAGNOSIS — Z9181 History of falling: Secondary | ICD-10-CM | POA: Diagnosis not present

## 2018-01-10 DIAGNOSIS — I5020 Unspecified systolic (congestive) heart failure: Secondary | ICD-10-CM | POA: Diagnosis not present

## 2018-01-10 DIAGNOSIS — I129 Hypertensive chronic kidney disease with stage 1 through stage 4 chronic kidney disease, or unspecified chronic kidney disease: Secondary | ICD-10-CM | POA: Diagnosis not present

## 2018-01-10 DIAGNOSIS — N185 Chronic kidney disease, stage 5: Secondary | ICD-10-CM | POA: Diagnosis not present

## 2018-01-10 DIAGNOSIS — I872 Venous insufficiency (chronic) (peripheral): Secondary | ICD-10-CM | POA: Diagnosis not present

## 2018-01-11 DIAGNOSIS — I5020 Unspecified systolic (congestive) heart failure: Secondary | ICD-10-CM | POA: Diagnosis not present

## 2018-01-11 DIAGNOSIS — K7460 Unspecified cirrhosis of liver: Secondary | ICD-10-CM | POA: Diagnosis not present

## 2018-01-11 DIAGNOSIS — N185 Chronic kidney disease, stage 5: Secondary | ICD-10-CM | POA: Diagnosis not present

## 2018-01-11 DIAGNOSIS — I872 Venous insufficiency (chronic) (peripheral): Secondary | ICD-10-CM | POA: Diagnosis not present

## 2018-01-11 DIAGNOSIS — E1122 Type 2 diabetes mellitus with diabetic chronic kidney disease: Secondary | ICD-10-CM | POA: Diagnosis not present

## 2018-01-11 DIAGNOSIS — R2689 Other abnormalities of gait and mobility: Secondary | ICD-10-CM | POA: Diagnosis not present

## 2018-01-11 DIAGNOSIS — Z951 Presence of aortocoronary bypass graft: Secondary | ICD-10-CM | POA: Diagnosis not present

## 2018-01-11 DIAGNOSIS — K4090 Unilateral inguinal hernia, without obstruction or gangrene, not specified as recurrent: Secondary | ICD-10-CM | POA: Diagnosis not present

## 2018-01-11 DIAGNOSIS — K8020 Calculus of gallbladder without cholecystitis without obstruction: Secondary | ICD-10-CM | POA: Diagnosis not present

## 2018-01-11 DIAGNOSIS — Z794 Long term (current) use of insulin: Secondary | ICD-10-CM | POA: Diagnosis not present

## 2018-01-11 DIAGNOSIS — M6281 Muscle weakness (generalized): Secondary | ICD-10-CM | POA: Diagnosis not present

## 2018-01-11 DIAGNOSIS — Z9181 History of falling: Secondary | ICD-10-CM | POA: Diagnosis not present

## 2018-01-15 DIAGNOSIS — K7460 Unspecified cirrhosis of liver: Secondary | ICD-10-CM | POA: Diagnosis not present

## 2018-01-15 DIAGNOSIS — E1122 Type 2 diabetes mellitus with diabetic chronic kidney disease: Secondary | ICD-10-CM | POA: Diagnosis not present

## 2018-01-15 DIAGNOSIS — I872 Venous insufficiency (chronic) (peripheral): Secondary | ICD-10-CM | POA: Diagnosis not present

## 2018-01-15 DIAGNOSIS — R2689 Other abnormalities of gait and mobility: Secondary | ICD-10-CM | POA: Diagnosis not present

## 2018-01-15 DIAGNOSIS — M6281 Muscle weakness (generalized): Secondary | ICD-10-CM | POA: Diagnosis not present

## 2018-01-15 DIAGNOSIS — I5020 Unspecified systolic (congestive) heart failure: Secondary | ICD-10-CM | POA: Diagnosis not present

## 2018-01-15 DIAGNOSIS — K4090 Unilateral inguinal hernia, without obstruction or gangrene, not specified as recurrent: Secondary | ICD-10-CM | POA: Diagnosis not present

## 2018-01-15 DIAGNOSIS — N185 Chronic kidney disease, stage 5: Secondary | ICD-10-CM | POA: Diagnosis not present

## 2018-01-15 DIAGNOSIS — Z951 Presence of aortocoronary bypass graft: Secondary | ICD-10-CM | POA: Diagnosis not present

## 2018-01-15 DIAGNOSIS — Z794 Long term (current) use of insulin: Secondary | ICD-10-CM | POA: Diagnosis not present

## 2018-01-15 DIAGNOSIS — Z9181 History of falling: Secondary | ICD-10-CM | POA: Diagnosis not present

## 2018-01-15 DIAGNOSIS — K8020 Calculus of gallbladder without cholecystitis without obstruction: Secondary | ICD-10-CM | POA: Diagnosis not present

## 2018-01-17 DIAGNOSIS — K8020 Calculus of gallbladder without cholecystitis without obstruction: Secondary | ICD-10-CM | POA: Diagnosis not present

## 2018-01-17 DIAGNOSIS — Z9181 History of falling: Secondary | ICD-10-CM | POA: Diagnosis not present

## 2018-01-17 DIAGNOSIS — M6281 Muscle weakness (generalized): Secondary | ICD-10-CM | POA: Diagnosis not present

## 2018-01-17 DIAGNOSIS — I5020 Unspecified systolic (congestive) heart failure: Secondary | ICD-10-CM | POA: Diagnosis not present

## 2018-01-17 DIAGNOSIS — I872 Venous insufficiency (chronic) (peripheral): Secondary | ICD-10-CM | POA: Diagnosis not present

## 2018-01-17 DIAGNOSIS — K4090 Unilateral inguinal hernia, without obstruction or gangrene, not specified as recurrent: Secondary | ICD-10-CM | POA: Diagnosis not present

## 2018-01-17 DIAGNOSIS — Z794 Long term (current) use of insulin: Secondary | ICD-10-CM | POA: Diagnosis not present

## 2018-01-17 DIAGNOSIS — N185 Chronic kidney disease, stage 5: Secondary | ICD-10-CM | POA: Diagnosis not present

## 2018-01-17 DIAGNOSIS — K7460 Unspecified cirrhosis of liver: Secondary | ICD-10-CM | POA: Diagnosis not present

## 2018-01-17 DIAGNOSIS — R2689 Other abnormalities of gait and mobility: Secondary | ICD-10-CM | POA: Diagnosis not present

## 2018-01-17 DIAGNOSIS — Z951 Presence of aortocoronary bypass graft: Secondary | ICD-10-CM | POA: Diagnosis not present

## 2018-01-17 DIAGNOSIS — E1122 Type 2 diabetes mellitus with diabetic chronic kidney disease: Secondary | ICD-10-CM | POA: Diagnosis not present

## 2018-01-18 DIAGNOSIS — K7460 Unspecified cirrhosis of liver: Secondary | ICD-10-CM | POA: Diagnosis not present

## 2018-01-18 DIAGNOSIS — E1122 Type 2 diabetes mellitus with diabetic chronic kidney disease: Secondary | ICD-10-CM | POA: Diagnosis not present

## 2018-01-18 DIAGNOSIS — I872 Venous insufficiency (chronic) (peripheral): Secondary | ICD-10-CM | POA: Diagnosis not present

## 2018-01-18 DIAGNOSIS — I5020 Unspecified systolic (congestive) heart failure: Secondary | ICD-10-CM | POA: Diagnosis not present

## 2018-01-18 DIAGNOSIS — N185 Chronic kidney disease, stage 5: Secondary | ICD-10-CM | POA: Diagnosis not present

## 2018-01-18 DIAGNOSIS — Z9181 History of falling: Secondary | ICD-10-CM | POA: Diagnosis not present

## 2018-01-18 DIAGNOSIS — Z794 Long term (current) use of insulin: Secondary | ICD-10-CM | POA: Diagnosis not present

## 2018-01-18 DIAGNOSIS — K4090 Unilateral inguinal hernia, without obstruction or gangrene, not specified as recurrent: Secondary | ICD-10-CM | POA: Diagnosis not present

## 2018-01-18 DIAGNOSIS — R2689 Other abnormalities of gait and mobility: Secondary | ICD-10-CM | POA: Diagnosis not present

## 2018-01-18 DIAGNOSIS — K8020 Calculus of gallbladder without cholecystitis without obstruction: Secondary | ICD-10-CM | POA: Diagnosis not present

## 2018-01-18 DIAGNOSIS — M6281 Muscle weakness (generalized): Secondary | ICD-10-CM | POA: Diagnosis not present

## 2018-01-18 DIAGNOSIS — Z951 Presence of aortocoronary bypass graft: Secondary | ICD-10-CM | POA: Diagnosis not present

## 2018-01-19 DIAGNOSIS — Z9181 History of falling: Secondary | ICD-10-CM | POA: Diagnosis not present

## 2018-01-19 DIAGNOSIS — Z794 Long term (current) use of insulin: Secondary | ICD-10-CM | POA: Diagnosis not present

## 2018-01-19 DIAGNOSIS — I872 Venous insufficiency (chronic) (peripheral): Secondary | ICD-10-CM | POA: Diagnosis not present

## 2018-01-19 DIAGNOSIS — E1122 Type 2 diabetes mellitus with diabetic chronic kidney disease: Secondary | ICD-10-CM | POA: Diagnosis not present

## 2018-01-19 DIAGNOSIS — N185 Chronic kidney disease, stage 5: Secondary | ICD-10-CM | POA: Diagnosis not present

## 2018-01-19 DIAGNOSIS — K7460 Unspecified cirrhosis of liver: Secondary | ICD-10-CM | POA: Diagnosis not present

## 2018-01-19 DIAGNOSIS — I5020 Unspecified systolic (congestive) heart failure: Secondary | ICD-10-CM | POA: Diagnosis not present

## 2018-01-19 DIAGNOSIS — M6281 Muscle weakness (generalized): Secondary | ICD-10-CM | POA: Diagnosis not present

## 2018-01-19 DIAGNOSIS — K4090 Unilateral inguinal hernia, without obstruction or gangrene, not specified as recurrent: Secondary | ICD-10-CM | POA: Diagnosis not present

## 2018-01-19 DIAGNOSIS — K8020 Calculus of gallbladder without cholecystitis without obstruction: Secondary | ICD-10-CM | POA: Diagnosis not present

## 2018-01-19 DIAGNOSIS — R2689 Other abnormalities of gait and mobility: Secondary | ICD-10-CM | POA: Diagnosis not present

## 2018-01-19 DIAGNOSIS — Z951 Presence of aortocoronary bypass graft: Secondary | ICD-10-CM | POA: Diagnosis not present

## 2018-01-22 DIAGNOSIS — M6281 Muscle weakness (generalized): Secondary | ICD-10-CM | POA: Diagnosis not present

## 2018-01-22 DIAGNOSIS — E1122 Type 2 diabetes mellitus with diabetic chronic kidney disease: Secondary | ICD-10-CM | POA: Diagnosis not present

## 2018-01-22 DIAGNOSIS — N185 Chronic kidney disease, stage 5: Secondary | ICD-10-CM | POA: Diagnosis not present

## 2018-01-22 DIAGNOSIS — R2689 Other abnormalities of gait and mobility: Secondary | ICD-10-CM | POA: Diagnosis not present

## 2018-01-22 DIAGNOSIS — K8020 Calculus of gallbladder without cholecystitis without obstruction: Secondary | ICD-10-CM | POA: Diagnosis not present

## 2018-01-22 DIAGNOSIS — Z9181 History of falling: Secondary | ICD-10-CM | POA: Diagnosis not present

## 2018-01-22 DIAGNOSIS — Z951 Presence of aortocoronary bypass graft: Secondary | ICD-10-CM | POA: Diagnosis not present

## 2018-01-22 DIAGNOSIS — K4090 Unilateral inguinal hernia, without obstruction or gangrene, not specified as recurrent: Secondary | ICD-10-CM | POA: Diagnosis not present

## 2018-01-22 DIAGNOSIS — K7460 Unspecified cirrhosis of liver: Secondary | ICD-10-CM | POA: Diagnosis not present

## 2018-01-22 DIAGNOSIS — I5020 Unspecified systolic (congestive) heart failure: Secondary | ICD-10-CM | POA: Diagnosis not present

## 2018-01-22 DIAGNOSIS — I872 Venous insufficiency (chronic) (peripheral): Secondary | ICD-10-CM | POA: Diagnosis not present

## 2018-01-22 DIAGNOSIS — Z794 Long term (current) use of insulin: Secondary | ICD-10-CM | POA: Diagnosis not present

## 2018-01-23 DIAGNOSIS — Z794 Long term (current) use of insulin: Secondary | ICD-10-CM | POA: Diagnosis not present

## 2018-01-23 DIAGNOSIS — N185 Chronic kidney disease, stage 5: Secondary | ICD-10-CM | POA: Diagnosis not present

## 2018-01-23 DIAGNOSIS — K4090 Unilateral inguinal hernia, without obstruction or gangrene, not specified as recurrent: Secondary | ICD-10-CM | POA: Diagnosis not present

## 2018-01-23 DIAGNOSIS — R2689 Other abnormalities of gait and mobility: Secondary | ICD-10-CM | POA: Diagnosis not present

## 2018-01-23 DIAGNOSIS — M6281 Muscle weakness (generalized): Secondary | ICD-10-CM | POA: Diagnosis not present

## 2018-01-23 DIAGNOSIS — I872 Venous insufficiency (chronic) (peripheral): Secondary | ICD-10-CM | POA: Diagnosis not present

## 2018-01-23 DIAGNOSIS — I5020 Unspecified systolic (congestive) heart failure: Secondary | ICD-10-CM | POA: Diagnosis not present

## 2018-01-23 DIAGNOSIS — Z9181 History of falling: Secondary | ICD-10-CM | POA: Diagnosis not present

## 2018-01-23 DIAGNOSIS — Z951 Presence of aortocoronary bypass graft: Secondary | ICD-10-CM | POA: Diagnosis not present

## 2018-01-23 DIAGNOSIS — K8020 Calculus of gallbladder without cholecystitis without obstruction: Secondary | ICD-10-CM | POA: Diagnosis not present

## 2018-01-23 DIAGNOSIS — E1122 Type 2 diabetes mellitus with diabetic chronic kidney disease: Secondary | ICD-10-CM | POA: Diagnosis not present

## 2018-01-23 DIAGNOSIS — K7460 Unspecified cirrhosis of liver: Secondary | ICD-10-CM | POA: Diagnosis not present

## 2018-01-24 DIAGNOSIS — K7460 Unspecified cirrhosis of liver: Secondary | ICD-10-CM | POA: Diagnosis not present

## 2018-01-24 DIAGNOSIS — Z9181 History of falling: Secondary | ICD-10-CM | POA: Diagnosis not present

## 2018-01-24 DIAGNOSIS — N185 Chronic kidney disease, stage 5: Secondary | ICD-10-CM | POA: Diagnosis not present

## 2018-01-24 DIAGNOSIS — K4090 Unilateral inguinal hernia, without obstruction or gangrene, not specified as recurrent: Secondary | ICD-10-CM | POA: Diagnosis not present

## 2018-01-24 DIAGNOSIS — Z794 Long term (current) use of insulin: Secondary | ICD-10-CM | POA: Diagnosis not present

## 2018-01-24 DIAGNOSIS — I5020 Unspecified systolic (congestive) heart failure: Secondary | ICD-10-CM | POA: Diagnosis not present

## 2018-01-24 DIAGNOSIS — R2689 Other abnormalities of gait and mobility: Secondary | ICD-10-CM | POA: Diagnosis not present

## 2018-01-24 DIAGNOSIS — Z951 Presence of aortocoronary bypass graft: Secondary | ICD-10-CM | POA: Diagnosis not present

## 2018-01-24 DIAGNOSIS — K8020 Calculus of gallbladder without cholecystitis without obstruction: Secondary | ICD-10-CM | POA: Diagnosis not present

## 2018-01-24 DIAGNOSIS — M6281 Muscle weakness (generalized): Secondary | ICD-10-CM | POA: Diagnosis not present

## 2018-01-24 DIAGNOSIS — I872 Venous insufficiency (chronic) (peripheral): Secondary | ICD-10-CM | POA: Diagnosis not present

## 2018-01-24 DIAGNOSIS — E1122 Type 2 diabetes mellitus with diabetic chronic kidney disease: Secondary | ICD-10-CM | POA: Diagnosis not present

## 2018-01-25 DIAGNOSIS — K8020 Calculus of gallbladder without cholecystitis without obstruction: Secondary | ICD-10-CM | POA: Diagnosis not present

## 2018-01-25 DIAGNOSIS — Z794 Long term (current) use of insulin: Secondary | ICD-10-CM | POA: Diagnosis not present

## 2018-01-25 DIAGNOSIS — M6281 Muscle weakness (generalized): Secondary | ICD-10-CM | POA: Diagnosis not present

## 2018-01-25 DIAGNOSIS — Z951 Presence of aortocoronary bypass graft: Secondary | ICD-10-CM | POA: Diagnosis not present

## 2018-01-25 DIAGNOSIS — E1122 Type 2 diabetes mellitus with diabetic chronic kidney disease: Secondary | ICD-10-CM | POA: Diagnosis not present

## 2018-01-25 DIAGNOSIS — K7460 Unspecified cirrhosis of liver: Secondary | ICD-10-CM | POA: Diagnosis not present

## 2018-01-25 DIAGNOSIS — I872 Venous insufficiency (chronic) (peripheral): Secondary | ICD-10-CM | POA: Diagnosis not present

## 2018-01-25 DIAGNOSIS — N185 Chronic kidney disease, stage 5: Secondary | ICD-10-CM | POA: Diagnosis not present

## 2018-01-25 DIAGNOSIS — K4090 Unilateral inguinal hernia, without obstruction or gangrene, not specified as recurrent: Secondary | ICD-10-CM | POA: Diagnosis not present

## 2018-01-25 DIAGNOSIS — R2689 Other abnormalities of gait and mobility: Secondary | ICD-10-CM | POA: Diagnosis not present

## 2018-01-25 DIAGNOSIS — I5020 Unspecified systolic (congestive) heart failure: Secondary | ICD-10-CM | POA: Diagnosis not present

## 2018-01-25 DIAGNOSIS — Z9181 History of falling: Secondary | ICD-10-CM | POA: Diagnosis not present

## 2018-01-26 DIAGNOSIS — K8020 Calculus of gallbladder without cholecystitis without obstruction: Secondary | ICD-10-CM | POA: Diagnosis not present

## 2018-01-26 DIAGNOSIS — I5020 Unspecified systolic (congestive) heart failure: Secondary | ICD-10-CM | POA: Diagnosis not present

## 2018-01-26 DIAGNOSIS — Z794 Long term (current) use of insulin: Secondary | ICD-10-CM | POA: Diagnosis not present

## 2018-01-26 DIAGNOSIS — N185 Chronic kidney disease, stage 5: Secondary | ICD-10-CM | POA: Diagnosis not present

## 2018-01-26 DIAGNOSIS — Z951 Presence of aortocoronary bypass graft: Secondary | ICD-10-CM | POA: Diagnosis not present

## 2018-01-26 DIAGNOSIS — Z9181 History of falling: Secondary | ICD-10-CM | POA: Diagnosis not present

## 2018-01-26 DIAGNOSIS — M6281 Muscle weakness (generalized): Secondary | ICD-10-CM | POA: Diagnosis not present

## 2018-01-26 DIAGNOSIS — R2689 Other abnormalities of gait and mobility: Secondary | ICD-10-CM | POA: Diagnosis not present

## 2018-01-26 DIAGNOSIS — K4090 Unilateral inguinal hernia, without obstruction or gangrene, not specified as recurrent: Secondary | ICD-10-CM | POA: Diagnosis not present

## 2018-01-26 DIAGNOSIS — I872 Venous insufficiency (chronic) (peripheral): Secondary | ICD-10-CM | POA: Diagnosis not present

## 2018-01-26 DIAGNOSIS — E1122 Type 2 diabetes mellitus with diabetic chronic kidney disease: Secondary | ICD-10-CM | POA: Diagnosis not present

## 2018-01-26 DIAGNOSIS — K7460 Unspecified cirrhosis of liver: Secondary | ICD-10-CM | POA: Diagnosis not present

## 2018-01-29 DIAGNOSIS — K4090 Unilateral inguinal hernia, without obstruction or gangrene, not specified as recurrent: Secondary | ICD-10-CM | POA: Diagnosis not present

## 2018-01-29 DIAGNOSIS — N185 Chronic kidney disease, stage 5: Secondary | ICD-10-CM | POA: Diagnosis not present

## 2018-01-29 DIAGNOSIS — R197 Diarrhea, unspecified: Secondary | ICD-10-CM | POA: Diagnosis not present

## 2018-01-29 DIAGNOSIS — M6281 Muscle weakness (generalized): Secondary | ICD-10-CM | POA: Diagnosis not present

## 2018-01-29 DIAGNOSIS — M25572 Pain in left ankle and joints of left foot: Secondary | ICD-10-CM | POA: Diagnosis not present

## 2018-01-29 DIAGNOSIS — Z9181 History of falling: Secondary | ICD-10-CM | POA: Diagnosis not present

## 2018-01-29 DIAGNOSIS — Z794 Long term (current) use of insulin: Secondary | ICD-10-CM | POA: Diagnosis not present

## 2018-01-29 DIAGNOSIS — H01009 Unspecified blepharitis unspecified eye, unspecified eyelid: Secondary | ICD-10-CM | POA: Diagnosis not present

## 2018-01-29 DIAGNOSIS — R600 Localized edema: Secondary | ICD-10-CM | POA: Diagnosis not present

## 2018-01-29 DIAGNOSIS — K8020 Calculus of gallbladder without cholecystitis without obstruction: Secondary | ICD-10-CM | POA: Diagnosis not present

## 2018-01-29 DIAGNOSIS — I5020 Unspecified systolic (congestive) heart failure: Secondary | ICD-10-CM | POA: Diagnosis not present

## 2018-01-29 DIAGNOSIS — M79672 Pain in left foot: Secondary | ICD-10-CM | POA: Diagnosis not present

## 2018-01-29 DIAGNOSIS — E1122 Type 2 diabetes mellitus with diabetic chronic kidney disease: Secondary | ICD-10-CM | POA: Diagnosis not present

## 2018-01-29 DIAGNOSIS — I872 Venous insufficiency (chronic) (peripheral): Secondary | ICD-10-CM | POA: Diagnosis not present

## 2018-01-29 DIAGNOSIS — K7460 Unspecified cirrhosis of liver: Secondary | ICD-10-CM | POA: Diagnosis not present

## 2018-01-29 DIAGNOSIS — R2689 Other abnormalities of gait and mobility: Secondary | ICD-10-CM | POA: Diagnosis not present

## 2018-01-29 DIAGNOSIS — Z951 Presence of aortocoronary bypass graft: Secondary | ICD-10-CM | POA: Diagnosis not present

## 2018-01-31 DIAGNOSIS — Z951 Presence of aortocoronary bypass graft: Secondary | ICD-10-CM | POA: Diagnosis not present

## 2018-01-31 DIAGNOSIS — R2689 Other abnormalities of gait and mobility: Secondary | ICD-10-CM | POA: Diagnosis not present

## 2018-01-31 DIAGNOSIS — N185 Chronic kidney disease, stage 5: Secondary | ICD-10-CM | POA: Diagnosis not present

## 2018-01-31 DIAGNOSIS — K4090 Unilateral inguinal hernia, without obstruction or gangrene, not specified as recurrent: Secondary | ICD-10-CM | POA: Diagnosis not present

## 2018-01-31 DIAGNOSIS — I5020 Unspecified systolic (congestive) heart failure: Secondary | ICD-10-CM | POA: Diagnosis not present

## 2018-01-31 DIAGNOSIS — Z794 Long term (current) use of insulin: Secondary | ICD-10-CM | POA: Diagnosis not present

## 2018-01-31 DIAGNOSIS — K8020 Calculus of gallbladder without cholecystitis without obstruction: Secondary | ICD-10-CM | POA: Diagnosis not present

## 2018-01-31 DIAGNOSIS — M6281 Muscle weakness (generalized): Secondary | ICD-10-CM | POA: Diagnosis not present

## 2018-01-31 DIAGNOSIS — K7460 Unspecified cirrhosis of liver: Secondary | ICD-10-CM | POA: Diagnosis not present

## 2018-01-31 DIAGNOSIS — E1122 Type 2 diabetes mellitus with diabetic chronic kidney disease: Secondary | ICD-10-CM | POA: Diagnosis not present

## 2018-01-31 DIAGNOSIS — Z9181 History of falling: Secondary | ICD-10-CM | POA: Diagnosis not present

## 2018-01-31 DIAGNOSIS — I872 Venous insufficiency (chronic) (peripheral): Secondary | ICD-10-CM | POA: Diagnosis not present

## 2018-02-01 DIAGNOSIS — E1122 Type 2 diabetes mellitus with diabetic chronic kidney disease: Secondary | ICD-10-CM | POA: Diagnosis not present

## 2018-02-01 DIAGNOSIS — I872 Venous insufficiency (chronic) (peripheral): Secondary | ICD-10-CM | POA: Diagnosis not present

## 2018-02-01 DIAGNOSIS — K4090 Unilateral inguinal hernia, without obstruction or gangrene, not specified as recurrent: Secondary | ICD-10-CM | POA: Diagnosis not present

## 2018-02-01 DIAGNOSIS — N185 Chronic kidney disease, stage 5: Secondary | ICD-10-CM | POA: Diagnosis not present

## 2018-02-01 DIAGNOSIS — K8020 Calculus of gallbladder without cholecystitis without obstruction: Secondary | ICD-10-CM | POA: Diagnosis not present

## 2018-02-01 DIAGNOSIS — Z794 Long term (current) use of insulin: Secondary | ICD-10-CM | POA: Diagnosis not present

## 2018-02-01 DIAGNOSIS — Z951 Presence of aortocoronary bypass graft: Secondary | ICD-10-CM | POA: Diagnosis not present

## 2018-02-01 DIAGNOSIS — M6281 Muscle weakness (generalized): Secondary | ICD-10-CM | POA: Diagnosis not present

## 2018-02-01 DIAGNOSIS — I5020 Unspecified systolic (congestive) heart failure: Secondary | ICD-10-CM | POA: Diagnosis not present

## 2018-02-01 DIAGNOSIS — R2689 Other abnormalities of gait and mobility: Secondary | ICD-10-CM | POA: Diagnosis not present

## 2018-02-01 DIAGNOSIS — K7460 Unspecified cirrhosis of liver: Secondary | ICD-10-CM | POA: Diagnosis not present

## 2018-02-01 DIAGNOSIS — Z9181 History of falling: Secondary | ICD-10-CM | POA: Diagnosis not present

## 2018-02-02 DIAGNOSIS — Z951 Presence of aortocoronary bypass graft: Secondary | ICD-10-CM | POA: Diagnosis not present

## 2018-02-02 DIAGNOSIS — K7460 Unspecified cirrhosis of liver: Secondary | ICD-10-CM | POA: Diagnosis not present

## 2018-02-02 DIAGNOSIS — R2689 Other abnormalities of gait and mobility: Secondary | ICD-10-CM | POA: Diagnosis not present

## 2018-02-02 DIAGNOSIS — K8020 Calculus of gallbladder without cholecystitis without obstruction: Secondary | ICD-10-CM | POA: Diagnosis not present

## 2018-02-02 DIAGNOSIS — K4090 Unilateral inguinal hernia, without obstruction or gangrene, not specified as recurrent: Secondary | ICD-10-CM | POA: Diagnosis not present

## 2018-02-02 DIAGNOSIS — Z794 Long term (current) use of insulin: Secondary | ICD-10-CM | POA: Diagnosis not present

## 2018-02-02 DIAGNOSIS — Z9181 History of falling: Secondary | ICD-10-CM | POA: Diagnosis not present

## 2018-02-02 DIAGNOSIS — I5020 Unspecified systolic (congestive) heart failure: Secondary | ICD-10-CM | POA: Diagnosis not present

## 2018-02-02 DIAGNOSIS — I872 Venous insufficiency (chronic) (peripheral): Secondary | ICD-10-CM | POA: Diagnosis not present

## 2018-02-02 DIAGNOSIS — E1122 Type 2 diabetes mellitus with diabetic chronic kidney disease: Secondary | ICD-10-CM | POA: Diagnosis not present

## 2018-02-02 DIAGNOSIS — N185 Chronic kidney disease, stage 5: Secondary | ICD-10-CM | POA: Diagnosis not present

## 2018-02-02 DIAGNOSIS — M6281 Muscle weakness (generalized): Secondary | ICD-10-CM | POA: Diagnosis not present

## 2018-02-03 DIAGNOSIS — I129 Hypertensive chronic kidney disease with stage 1 through stage 4 chronic kidney disease, or unspecified chronic kidney disease: Secondary | ICD-10-CM | POA: Diagnosis not present

## 2018-02-06 DIAGNOSIS — I872 Venous insufficiency (chronic) (peripheral): Secondary | ICD-10-CM | POA: Diagnosis not present

## 2018-02-06 DIAGNOSIS — Z951 Presence of aortocoronary bypass graft: Secondary | ICD-10-CM | POA: Diagnosis not present

## 2018-02-06 DIAGNOSIS — Z9181 History of falling: Secondary | ICD-10-CM | POA: Diagnosis not present

## 2018-02-06 DIAGNOSIS — K4090 Unilateral inguinal hernia, without obstruction or gangrene, not specified as recurrent: Secondary | ICD-10-CM | POA: Diagnosis not present

## 2018-02-06 DIAGNOSIS — E1122 Type 2 diabetes mellitus with diabetic chronic kidney disease: Secondary | ICD-10-CM | POA: Diagnosis not present

## 2018-02-06 DIAGNOSIS — Z794 Long term (current) use of insulin: Secondary | ICD-10-CM | POA: Diagnosis not present

## 2018-02-06 DIAGNOSIS — R2689 Other abnormalities of gait and mobility: Secondary | ICD-10-CM | POA: Diagnosis not present

## 2018-02-06 DIAGNOSIS — I5020 Unspecified systolic (congestive) heart failure: Secondary | ICD-10-CM | POA: Diagnosis not present

## 2018-02-06 DIAGNOSIS — K7460 Unspecified cirrhosis of liver: Secondary | ICD-10-CM | POA: Diagnosis not present

## 2018-02-06 DIAGNOSIS — K8020 Calculus of gallbladder without cholecystitis without obstruction: Secondary | ICD-10-CM | POA: Diagnosis not present

## 2018-02-06 DIAGNOSIS — M6281 Muscle weakness (generalized): Secondary | ICD-10-CM | POA: Diagnosis not present

## 2018-02-06 DIAGNOSIS — N185 Chronic kidney disease, stage 5: Secondary | ICD-10-CM | POA: Diagnosis not present

## 2018-02-08 DIAGNOSIS — M6281 Muscle weakness (generalized): Secondary | ICD-10-CM | POA: Diagnosis not present

## 2018-02-08 DIAGNOSIS — K8020 Calculus of gallbladder without cholecystitis without obstruction: Secondary | ICD-10-CM | POA: Diagnosis not present

## 2018-02-08 DIAGNOSIS — Z951 Presence of aortocoronary bypass graft: Secondary | ICD-10-CM | POA: Diagnosis not present

## 2018-02-08 DIAGNOSIS — I872 Venous insufficiency (chronic) (peripheral): Secondary | ICD-10-CM | POA: Diagnosis not present

## 2018-02-08 DIAGNOSIS — Z9181 History of falling: Secondary | ICD-10-CM | POA: Diagnosis not present

## 2018-02-08 DIAGNOSIS — K7460 Unspecified cirrhosis of liver: Secondary | ICD-10-CM | POA: Diagnosis not present

## 2018-02-08 DIAGNOSIS — I5020 Unspecified systolic (congestive) heart failure: Secondary | ICD-10-CM | POA: Diagnosis not present

## 2018-02-08 DIAGNOSIS — R2689 Other abnormalities of gait and mobility: Secondary | ICD-10-CM | POA: Diagnosis not present

## 2018-02-08 DIAGNOSIS — E1122 Type 2 diabetes mellitus with diabetic chronic kidney disease: Secondary | ICD-10-CM | POA: Diagnosis not present

## 2018-02-08 DIAGNOSIS — Z794 Long term (current) use of insulin: Secondary | ICD-10-CM | POA: Diagnosis not present

## 2018-02-08 DIAGNOSIS — N185 Chronic kidney disease, stage 5: Secondary | ICD-10-CM | POA: Diagnosis not present

## 2018-02-08 DIAGNOSIS — K4090 Unilateral inguinal hernia, without obstruction or gangrene, not specified as recurrent: Secondary | ICD-10-CM | POA: Diagnosis not present

## 2018-02-09 DIAGNOSIS — I872 Venous insufficiency (chronic) (peripheral): Secondary | ICD-10-CM | POA: Diagnosis not present

## 2018-02-09 DIAGNOSIS — Z794 Long term (current) use of insulin: Secondary | ICD-10-CM | POA: Diagnosis not present

## 2018-02-09 DIAGNOSIS — K7460 Unspecified cirrhosis of liver: Secondary | ICD-10-CM | POA: Diagnosis not present

## 2018-02-09 DIAGNOSIS — E1122 Type 2 diabetes mellitus with diabetic chronic kidney disease: Secondary | ICD-10-CM | POA: Diagnosis not present

## 2018-02-09 DIAGNOSIS — Z9181 History of falling: Secondary | ICD-10-CM | POA: Diagnosis not present

## 2018-02-09 DIAGNOSIS — K4090 Unilateral inguinal hernia, without obstruction or gangrene, not specified as recurrent: Secondary | ICD-10-CM | POA: Diagnosis not present

## 2018-02-09 DIAGNOSIS — Z951 Presence of aortocoronary bypass graft: Secondary | ICD-10-CM | POA: Diagnosis not present

## 2018-02-09 DIAGNOSIS — M6281 Muscle weakness (generalized): Secondary | ICD-10-CM | POA: Diagnosis not present

## 2018-02-09 DIAGNOSIS — N185 Chronic kidney disease, stage 5: Secondary | ICD-10-CM | POA: Diagnosis not present

## 2018-02-09 DIAGNOSIS — I5020 Unspecified systolic (congestive) heart failure: Secondary | ICD-10-CM | POA: Diagnosis not present

## 2018-02-09 DIAGNOSIS — K8020 Calculus of gallbladder without cholecystitis without obstruction: Secondary | ICD-10-CM | POA: Diagnosis not present

## 2018-02-09 DIAGNOSIS — R2689 Other abnormalities of gait and mobility: Secondary | ICD-10-CM | POA: Diagnosis not present

## 2018-02-10 DIAGNOSIS — I129 Hypertensive chronic kidney disease with stage 1 through stage 4 chronic kidney disease, or unspecified chronic kidney disease: Secondary | ICD-10-CM | POA: Diagnosis not present

## 2018-02-13 DIAGNOSIS — M6281 Muscle weakness (generalized): Secondary | ICD-10-CM | POA: Diagnosis not present

## 2018-02-13 DIAGNOSIS — I872 Venous insufficiency (chronic) (peripheral): Secondary | ICD-10-CM | POA: Diagnosis not present

## 2018-02-13 DIAGNOSIS — Z951 Presence of aortocoronary bypass graft: Secondary | ICD-10-CM | POA: Diagnosis not present

## 2018-02-13 DIAGNOSIS — I5020 Unspecified systolic (congestive) heart failure: Secondary | ICD-10-CM | POA: Diagnosis not present

## 2018-02-13 DIAGNOSIS — Z9181 History of falling: Secondary | ICD-10-CM | POA: Diagnosis not present

## 2018-02-13 DIAGNOSIS — E1122 Type 2 diabetes mellitus with diabetic chronic kidney disease: Secondary | ICD-10-CM | POA: Diagnosis not present

## 2018-02-13 DIAGNOSIS — Z794 Long term (current) use of insulin: Secondary | ICD-10-CM | POA: Diagnosis not present

## 2018-02-13 DIAGNOSIS — R2689 Other abnormalities of gait and mobility: Secondary | ICD-10-CM | POA: Diagnosis not present

## 2018-02-13 DIAGNOSIS — K8020 Calculus of gallbladder without cholecystitis without obstruction: Secondary | ICD-10-CM | POA: Diagnosis not present

## 2018-02-13 DIAGNOSIS — K4090 Unilateral inguinal hernia, without obstruction or gangrene, not specified as recurrent: Secondary | ICD-10-CM | POA: Diagnosis not present

## 2018-02-13 DIAGNOSIS — K7460 Unspecified cirrhosis of liver: Secondary | ICD-10-CM | POA: Diagnosis not present

## 2018-02-13 DIAGNOSIS — N185 Chronic kidney disease, stage 5: Secondary | ICD-10-CM | POA: Diagnosis not present

## 2018-02-20 DIAGNOSIS — Z951 Presence of aortocoronary bypass graft: Secondary | ICD-10-CM | POA: Diagnosis not present

## 2018-02-20 DIAGNOSIS — K7460 Unspecified cirrhosis of liver: Secondary | ICD-10-CM | POA: Diagnosis not present

## 2018-02-20 DIAGNOSIS — I5020 Unspecified systolic (congestive) heart failure: Secondary | ICD-10-CM | POA: Diagnosis not present

## 2018-02-20 DIAGNOSIS — N185 Chronic kidney disease, stage 5: Secondary | ICD-10-CM | POA: Diagnosis not present

## 2018-02-20 DIAGNOSIS — Z794 Long term (current) use of insulin: Secondary | ICD-10-CM | POA: Diagnosis not present

## 2018-02-20 DIAGNOSIS — K4090 Unilateral inguinal hernia, without obstruction or gangrene, not specified as recurrent: Secondary | ICD-10-CM | POA: Diagnosis not present

## 2018-02-20 DIAGNOSIS — I872 Venous insufficiency (chronic) (peripheral): Secondary | ICD-10-CM | POA: Diagnosis not present

## 2018-02-20 DIAGNOSIS — E1122 Type 2 diabetes mellitus with diabetic chronic kidney disease: Secondary | ICD-10-CM | POA: Diagnosis not present

## 2018-02-20 DIAGNOSIS — R2689 Other abnormalities of gait and mobility: Secondary | ICD-10-CM | POA: Diagnosis not present

## 2018-02-20 DIAGNOSIS — Z9181 History of falling: Secondary | ICD-10-CM | POA: Diagnosis not present

## 2018-02-20 DIAGNOSIS — M6281 Muscle weakness (generalized): Secondary | ICD-10-CM | POA: Diagnosis not present

## 2018-02-20 DIAGNOSIS — K8020 Calculus of gallbladder without cholecystitis without obstruction: Secondary | ICD-10-CM | POA: Diagnosis not present

## 2018-02-28 ENCOUNTER — Other Ambulatory Visit: Payer: Self-pay

## 2018-02-28 NOTE — Patient Outreach (Signed)
[facility]) Care Management  02/28/2018  KELCY BAETEN 1931-12-01 [id number]   Medication Adherence call to Mr. Cortavius Montesinos spoke with patient but he is hard of hearing we could not communicate [place] said patient have all his medication sink together for the month.Mr. Muska is showing past due under [facility].  [place] Management Direct Dial [phone]  Fax [phone] [email or web address]

## 2018-03-05 DIAGNOSIS — I129 Hypertensive chronic kidney disease with stage 1 through stage 4 chronic kidney disease, or unspecified chronic kidney disease: Secondary | ICD-10-CM | POA: Diagnosis not present

## 2018-03-06 DIAGNOSIS — I2510 Atherosclerotic heart disease of native coronary artery without angina pectoris: Secondary | ICD-10-CM | POA: Diagnosis not present

## 2018-03-06 DIAGNOSIS — R944 Abnormal results of kidney function studies: Secondary | ICD-10-CM | POA: Diagnosis not present

## 2018-03-06 DIAGNOSIS — E1122 Type 2 diabetes mellitus with diabetic chronic kidney disease: Secondary | ICD-10-CM | POA: Diagnosis not present

## 2018-03-06 DIAGNOSIS — I509 Heart failure, unspecified: Secondary | ICD-10-CM | POA: Diagnosis not present

## 2018-03-06 DIAGNOSIS — I10 Essential (primary) hypertension: Secondary | ICD-10-CM | POA: Diagnosis not present

## 2018-03-07 DIAGNOSIS — L110 Acquired keratosis follicularis: Secondary | ICD-10-CM | POA: Diagnosis not present

## 2018-03-07 DIAGNOSIS — B351 Tinea unguium: Secondary | ICD-10-CM | POA: Diagnosis not present

## 2018-03-07 DIAGNOSIS — E1151 Type 2 diabetes mellitus with diabetic peripheral angiopathy without gangrene: Secondary | ICD-10-CM | POA: Diagnosis not present

## 2018-03-12 DIAGNOSIS — I129 Hypertensive chronic kidney disease with stage 1 through stage 4 chronic kidney disease, or unspecified chronic kidney disease: Secondary | ICD-10-CM | POA: Diagnosis not present

## 2018-03-15 DIAGNOSIS — R600 Localized edema: Secondary | ICD-10-CM | POA: Diagnosis not present

## 2018-03-15 DIAGNOSIS — E1122 Type 2 diabetes mellitus with diabetic chronic kidney disease: Secondary | ICD-10-CM | POA: Diagnosis not present

## 2018-03-15 DIAGNOSIS — I2510 Atherosclerotic heart disease of native coronary artery without angina pectoris: Secondary | ICD-10-CM | POA: Diagnosis not present

## 2018-03-15 DIAGNOSIS — N184 Chronic kidney disease, stage 4 (severe): Secondary | ICD-10-CM | POA: Diagnosis not present

## 2018-03-15 DIAGNOSIS — I10 Essential (primary) hypertension: Secondary | ICD-10-CM | POA: Diagnosis not present

## 2018-03-22 DIAGNOSIS — H10502 Unspecified blepharoconjunctivitis, left eye: Secondary | ICD-10-CM | POA: Diagnosis not present

## 2018-03-22 DIAGNOSIS — H02102 Unspecified ectropion of right lower eyelid: Secondary | ICD-10-CM | POA: Diagnosis not present

## 2018-04-02 DIAGNOSIS — H04123 Dry eye syndrome of bilateral lacrimal glands: Secondary | ICD-10-CM | POA: Diagnosis not present

## 2018-04-02 DIAGNOSIS — H16143 Punctate keratitis, bilateral: Secondary | ICD-10-CM | POA: Diagnosis not present

## 2018-04-05 DIAGNOSIS — I129 Hypertensive chronic kidney disease with stage 1 through stage 4 chronic kidney disease, or unspecified chronic kidney disease: Secondary | ICD-10-CM | POA: Diagnosis not present

## 2018-04-12 DIAGNOSIS — I129 Hypertensive chronic kidney disease with stage 1 through stage 4 chronic kidney disease, or unspecified chronic kidney disease: Secondary | ICD-10-CM | POA: Diagnosis not present

## 2018-05-01 ENCOUNTER — Encounter: Payer: Self-pay | Admitting: Cardiovascular Disease

## 2018-05-01 ENCOUNTER — Ambulatory Visit: Payer: Medicare Other | Admitting: Cardiovascular Disease

## 2018-05-01 VITALS — BP 142/62 | HR 53 | Ht 69.0 in | Wt 190.0 lb

## 2018-05-01 DIAGNOSIS — I5042 Chronic combined systolic (congestive) and diastolic (congestive) heart failure: Secondary | ICD-10-CM

## 2018-05-01 DIAGNOSIS — I10 Essential (primary) hypertension: Secondary | ICD-10-CM | POA: Diagnosis not present

## 2018-05-01 DIAGNOSIS — I25708 Atherosclerosis of coronary artery bypass graft(s), unspecified, with other forms of angina pectoris: Secondary | ICD-10-CM | POA: Diagnosis not present

## 2018-05-01 DIAGNOSIS — R600 Localized edema: Secondary | ICD-10-CM

## 2018-05-01 DIAGNOSIS — E785 Hyperlipidemia, unspecified: Secondary | ICD-10-CM

## 2018-05-01 DIAGNOSIS — R000 Tachycardia, unspecified: Secondary | ICD-10-CM

## 2018-05-01 NOTE — Progress Notes (Signed)
SUBJECTIVE: The patient returns for follow-up of coronary artery disease and chronic systolic and diastolic heart failure. Nuclear stress test on 10/23/15 demonstrated a large defect involving the inferoseptal, inferior, and lateral wall extending from the apex to the base with some degree of reversibility suggestive of moderate lateral ischemia. It was deemed a high risk study.  Echocardiogram performed on 10/04/15 demonstrated mild to moderately reduced left ventricular systolic function, EF 02-54%, with inferior wall hypokinesis, grade 2 diastolic dysfunction , and mild to moderate mitral regurgitation.   He underwent 5 vessel coronary artery bypass graft surgery on 02/17/15. He has a history of pulmonary embolism and had been anticoagulated with Eliquis. He also has chronic kidney disease stage III. The EF had dropped and had previously been normal , 50-55%, in July 2016.  [facility]liquiswas [facility]. ASA was also stopped.  He has a history of chronic lower extremity edema and urinary incontinence.  He takes Lasix 20-40 mg.  He is feeling very well and denies chest pain shortness of breath.  He keeps his legs elevated at home.  He does struggle with bilateral feet neuropathy.  His left leg is more swollen than his right leg.  He has tried gabapentin for neuropathy but it led to blurry vision.  He tried tramadol but does not really like taking this.  He is tried some topical ointments which do not last for very long.    Review of Systems: As per "subjective", otherwise negative.  Allergies  Allergen Reactions  . Eliquis [Apixaban]     Per pt "will bleed to death"   . Bactrim [Sulfamethoxazole-Trimethoprim] Rash  . Hydralazine Rash    Current Outpatient Medications  Medication Sig Dispense Refill  . acetaminophen (TYLENOL) 500 MG tablet Take 500 mg by mouth every 6 (six) hours as needed for mild pain.    Marland Kitchen amLODipine (NORVASC) 5 MG  tablet TAKE 1 TABLET BY MOUTH DAILY 30 tablet 6  . atorvastatin (LIPITOR) 20 MG tablet Take 1 tablet by mouth daily.    . carvedilol (COREG) 6.25 MG tablet TAKE ONE TABLET BY MOUTH TWICE DAILY 60 tablet 6  . cholecalciferol (VITAMIN D) 1000 units tablet Take 1,000 Units by mouth daily.    . ferrous sulfate 325 (65 FE) MG tablet Take 1 tablet (325 mg total) by mouth daily with breakfast.  3  . furosemide (LASIX) 20 MG tablet Take 20 mg by mouth daily.    Marland Kitchen gabapentin (NEURONTIN) 100 MG capsule Take 100 mg by mouth at bedtime.    Marland Kitchen losartan (COZAAR) 25 MG tablet Take 25 mg by mouth daily.    Marland Kitchen omeprazole (PRILOSEC) 20 MG capsule Take 20 mg by mouth daily.    Marland Kitchen OVER THE [place] extra strength probiotic daily    . traMADol (ULTRAM) 50 MG tablet Take 50 mg by mouth every 6 (six) hours as needed for moderate pain.     No current facility-administered medications for this visit.     Past Medical History:  Diagnosis Date  . Arthritis    "legs" (02/07/2015)  . Basal cell carcinoma of forehead 2016 X 2  . Basal cell carcinoma of left earlobe   . CHF (congestive heart failure) ([place])   . Diabetic neuropathy ([place]) 09/04/2017  . Diabetic peripheral neuropathy ([place])    "left foot" (02/07/2015)  . 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 GERD (gastroesophageal reflux disease)   .  High potassium   . History of gout X 1  . Hyperlipidemia   . Hypertension   . Old myocardial infarct    "sometime in the past; don't know when" (02/07/2015)  . Pain and swelling of left lower leg    chronic  . Pneumonia X 1  . Renal insufficiency   . Respiratory failure ([place])   . Type II diabetes mellitus ([place])   . Varicose veins     Past Surgical History:  Procedure Laterality Date  . BASAL CELL CARCINOMA EXCISION     "probably 1/2 dozen cut off face, left ear" (02/07/2015)  . CARDIAC CATHETERIZATION N/A 02/09/2015   Procedure: Left Heart Cath and Coronary Angiography;   Surgeon: Leonie Man, MD;  Location: [place] [facility];  Service: Cardiovascular;  Laterality: N/A;  . CARDIOVASCULAR STRESS TEST  05/29/2012   Mild-moderate perfusion defect due to infarct/scar with mild-moderate perinfarct ischemia seen in the mid anterior, apical anterior, apical septal, and apical regions. No ECG changes. Global LV systolic function is severely reduced.  Marland Kitchen CATARACT EXTRACTION W/PHACO Left 11/12/2015   Procedure: CATARACT EXTRACTION PHACO AND INTRAOCULAR LENS PLACEMENT LEFT EYE;  Surgeon: Tonny Branch, MD;  [facility];  Service: Ophthalmology;  Laterality: Left;  CDE: 9.82  . CATARACT EXTRACTION W/PHACO Right 12/14/2015   Procedure: CATARACT EXTRACTION PHACO AND INTRAOCULAR LENS PLACEMENT (IOC);  Surgeon: Tonny Branch, MD;  Location: [facility];  Service: Ophthalmology;  Laterality: Right;  CDE: 13.86  . CORONARY ARTERY BYPASS GRAFT N/A 02/17/2015   Procedure: CORONARY ARTERY BYPASS GRAFTING (CABG), ON PUMP, TIMES FIVE, USING LEFT INTERNAL MAMMARY ARTERY, RIGHT GREATER SAPHENOUS VEIN HARVESTED ENDOSCOPICALLY;  Surgeon: [name], MD;  Location: [place];  Service: Open Heart Surgery;  Laterality: N/A;  -LIMA to LAD -SVG to DIAGONAL - SEQ SVG to OM1 and PLB -SVG to PDA  . CYSTOSCOPY W/ STONE MANIPULATION  X 1  . FRACTURE SURGERY    . KNEE ARTHROSCOPY Right ~ 2008  . LEXISCAM MYOCARDIAL PERFUSION  05/29/12   MARKED PERFUSION DEFECT DUE TO INFARC/SCAR WITH MILD PERINFARCT ISCHEMIA IN THE BASAL INFERIOR, MID INFEROSEPTAL, MID INFERIOR AND APICALINFERIOR REGION. EF%33%. PERIINFARCT ISCHEMIA IN THE MID ANTERIOR, APICAL ANTERIOR, APICAL SEPTAL AND APICAL REGIONS.  . LOWER EXTREMITY VENOUS DOPPLER  07/11/2012   No evidence of DVT in the left lower extremity. Evidence of partially recanalized, chronic, non-obstructive thrombus in the left great SV and its branches consistent with significant reflux consistent with post-phlebitic syndrome. Significant reflux of the left short saphenous  vein.  Marland Kitchen open heart sx  02/17/15  . ORIF HIP FRACTURE  03/20/2012   Procedure: OPEN REDUCTION INTERNAL FIXATION HIP;  Surgeon: Sanjuana Kava, MD;  Location: [facility];  Service: Orthopedics;  Laterality: Right;  . TEE WITHOUT CARDIOVERSION N/A 02/17/2015   Procedure: TRANSESOPHAGEAL ECHOCARDIOGRAM (TEE);  Surgeon: [name], MD;  Location: [place];  Service: Open Heart Surgery;  Laterality: N/A;  . TRANSTHORACIC ECHOCARDIOGRAM  04/18/2012   EF 45%, mild-moderate LVH  . TRANSTHORACIC ECHOCARDIOGRAM  04/18/12   EF% 45%.SEVERE HYPOKINESIS TO AKINESIS OF THE MID-DISTAL INFEROLATERAL MYOCARDIUM AND MUCH OF THE APEX.    Social History   Socioeconomic History  . Marital status: Married    Spouse name: Not on file  . Number of children: Not on file  . Years of education: Not on file  . Highest education level: Not on file  Occupational History  . Not on file  Social Needs  . Emergency planning/management officer  strain: Not on file  . Food insecurity:    Worry: Not on file    Inability: Not on file  . Transportation needs:    Medical: Not on file    Non-medical: Not on file  Tobacco Use  . Smoking status: Never Smoker  . Smokeless tobacco: Never Used  Substance and Sexual Activity  . Alcohol use: No    Alcohol/week: 0.0 standard drinks  . Drug use: No  . Sexual activity: Not Currently  Lifestyle  . Physical activity:    Days per week: Not on file    Minutes per session: Not on file  . Stress: Not on file  Relationships  . Social connections:    Talks on phone: Not on file    Gets together: Not on file    Attends religious service: Not on file    Active member of club or organization: Not on file    Attends meetings of clubs or organizations: Not on file    Relationship status: Not on file  . Intimate partner violence:    Fear of current or ex partner: Not on file    Emotionally abused: Not on file    Physically abused: Not on file    Forced sexual activity: Not on file  Other Topics  Concern  . Not on file  Social History Narrative  . Not on file     Vitals:   05/01/18 1332  BP: (!) 142/62  Pulse: (!) 53  SpO2: 92%  Weight: 190 lb (86.2 kg)  Height: 5\' 9"  (1.753 m)    Wt Readings from Last 3 Encounters:  05/01/18 190 lb (86.2 kg)  10/31/17 207 lb (93.9 kg)  05/03/17 200 lb (90.7 kg)     PHYSICAL EXAM General: NAD HEENT: Normal. Neck: No JVD, no thyromegaly. Lungs: Clear to auscultation bilaterally with normal respiratory effort. CV: Regular rate and rhythm, normal S1/S2, no S3/S4, 3/6 apical holosystolicmurmur.   Chronic bilateral lower extremity edema, left greater than right.  Wearing compression stockings.  Abdomen: Soft, nontender, no distention.  Neurologic: Alert and oriented.  Psych: Normal affect. Skin: Normal. Musculoskeletal: No gross deformities.    ECG: Reviewed above under Subjective   Labs: Lab Results  Component Value Date/Time   K 4.1 12/30/2016 04:31 PM   BUN 39 (H) 12/30/2016 04:31 PM   CREATININE 1.48 (H) 12/30/2016 04:31 PM   CREATININE 1.45 (H) 05/15/2015 11:47 AM   ALT 15 (L) 12/20/2016 02:15 PM   TSH 1.364 02/07/2015 12:47 AM   TSH 1.634 03/20/2012 08:20 AM   HGB 10.5 (L) 12/30/2016 04:07 PM     Lipids: Lab Results  Component Value Date/Time   LDLCALC 48 02/07/2015 05:33 AM   CHOL 83 02/07/2015 05:33 AM   TRIG 91 02/07/2015 05:33 AM   HDL 17 (L) 02/07/2015 05:33 AM       ASSESSMENT AND PLAN:  1. Chronic combined systolic and diastolic heart failure: Symptomatically stable.  Continue Lasix and Coreg.He prefers taking Lasix 20-40 mg only every morning due to urinary incontinence.  2. CAD s/p 5-v CABG: Symptomatically stable.  On Lipitor.No longer on ASA for reasons mentioned above.Lexiscan Cardiolite indicative of ischemic etiology for EF reduction. Continue Coreg and atorvastatin. We previously discussed coronary angiography and he didnot want to proceed.  3. Pulmonary embolism:Occurred in post-op  state. Will hold off on Eliquis altogether.  4. Essential MWU:XLKGMW elevated. Hydralazineled to a rash.   He said his foot was hurting him earlier which led  to increased blood pressure.  I will monitor.  5. Hyperlipidemia: Continue atorvastatin.  6. Tachycardia: Resolved with Coreg.  7. Chronic venous stasis with ulceration: Stable.   Disposition: Follow up 6 months   Kate Sable, M.D., F.A.C.C.

## 2018-05-01 NOTE — Patient Instructions (Signed)

## 2018-05-05 DIAGNOSIS — L0390 Cellulitis, unspecified: Secondary | ICD-10-CM | POA: Diagnosis not present

## 2018-05-06 DIAGNOSIS — I129 Hypertensive chronic kidney disease with stage 1 through stage 4 chronic kidney disease, or unspecified chronic kidney disease: Secondary | ICD-10-CM | POA: Diagnosis not present

## 2018-05-07 ENCOUNTER — Encounter: Payer: Self-pay | Admitting: *Deleted

## 2018-05-13 DIAGNOSIS — I129 Hypertensive chronic kidney disease with stage 1 through stage 4 chronic kidney disease, or unspecified chronic kidney disease: Secondary | ICD-10-CM | POA: Diagnosis not present

## 2018-05-15 DIAGNOSIS — I129 Hypertensive chronic kidney disease with stage 1 through stage 4 chronic kidney disease, or unspecified chronic kidney disease: Secondary | ICD-10-CM | POA: Diagnosis not present

## 2018-05-15 DIAGNOSIS — Z79899 Other long term (current) drug therapy: Secondary | ICD-10-CM | POA: Diagnosis not present

## 2018-05-15 DIAGNOSIS — E559 Vitamin D deficiency, unspecified: Secondary | ICD-10-CM | POA: Diagnosis not present

## 2018-05-15 DIAGNOSIS — D509 Iron deficiency anemia, unspecified: Secondary | ICD-10-CM | POA: Diagnosis not present

## 2018-05-15 DIAGNOSIS — R809 Proteinuria, unspecified: Secondary | ICD-10-CM | POA: Diagnosis not present

## 2018-05-15 DIAGNOSIS — N183 Chronic kidney disease, stage 3 (moderate): Secondary | ICD-10-CM | POA: Diagnosis not present

## 2018-05-22 DIAGNOSIS — I509 Heart failure, unspecified: Secondary | ICD-10-CM | POA: Diagnosis not present

## 2018-05-22 DIAGNOSIS — I10 Essential (primary) hypertension: Secondary | ICD-10-CM | POA: Diagnosis not present

## 2018-05-22 DIAGNOSIS — N183 Chronic kidney disease, stage 3 (moderate): Secondary | ICD-10-CM | POA: Diagnosis not present

## 2018-05-22 DIAGNOSIS — R809 Proteinuria, unspecified: Secondary | ICD-10-CM | POA: Diagnosis not present

## 2018-05-29 DIAGNOSIS — Z0000 Encounter for general adult medical examination without abnormal findings: Secondary | ICD-10-CM | POA: Diagnosis not present

## 2018-05-29 DIAGNOSIS — I2510 Atherosclerotic heart disease of native coronary artery without angina pectoris: Secondary | ICD-10-CM | POA: Diagnosis not present

## 2018-05-29 DIAGNOSIS — N184 Chronic kidney disease, stage 4 (severe): Secondary | ICD-10-CM | POA: Diagnosis not present

## 2018-05-29 DIAGNOSIS — I480 Paroxysmal atrial fibrillation: Secondary | ICD-10-CM | POA: Diagnosis not present

## 2018-05-29 DIAGNOSIS — I10 Essential (primary) hypertension: Secondary | ICD-10-CM | POA: Diagnosis not present

## 2018-05-30 DIAGNOSIS — L110 Acquired keratosis follicularis: Secondary | ICD-10-CM | POA: Diagnosis not present

## 2018-05-30 DIAGNOSIS — E1151 Type 2 diabetes mellitus with diabetic peripheral angiopathy without gangrene: Secondary | ICD-10-CM | POA: Diagnosis not present

## 2018-05-30 DIAGNOSIS — B351 Tinea unguium: Secondary | ICD-10-CM | POA: Diagnosis not present

## 2018-06-05 DIAGNOSIS — R944 Abnormal results of kidney function studies: Secondary | ICD-10-CM | POA: Diagnosis not present

## 2018-06-05 DIAGNOSIS — I10 Essential (primary) hypertension: Secondary | ICD-10-CM | POA: Diagnosis not present

## 2018-06-05 DIAGNOSIS — M159 Polyosteoarthritis, unspecified: Secondary | ICD-10-CM | POA: Diagnosis not present

## 2018-06-05 DIAGNOSIS — I509 Heart failure, unspecified: Secondary | ICD-10-CM | POA: Diagnosis not present

## 2018-06-05 DIAGNOSIS — I2510 Atherosclerotic heart disease of native coronary artery without angina pectoris: Secondary | ICD-10-CM | POA: Diagnosis not present

## 2018-06-05 DIAGNOSIS — I129 Hypertensive chronic kidney disease with stage 1 through stage 4 chronic kidney disease, or unspecified chronic kidney disease: Secondary | ICD-10-CM | POA: Diagnosis not present

## 2018-06-12 DIAGNOSIS — I129 Hypertensive chronic kidney disease with stage 1 through stage 4 chronic kidney disease, or unspecified chronic kidney disease: Secondary | ICD-10-CM | POA: Diagnosis not present

## 2018-06-27 DIAGNOSIS — I10 Essential (primary) hypertension: Secondary | ICD-10-CM | POA: Diagnosis not present

## 2018-06-27 DIAGNOSIS — I2510 Atherosclerotic heart disease of native coronary artery without angina pectoris: Secondary | ICD-10-CM | POA: Diagnosis not present

## 2018-06-27 DIAGNOSIS — M159 Polyosteoarthritis, unspecified: Secondary | ICD-10-CM | POA: Diagnosis not present

## 2018-06-27 DIAGNOSIS — Z23 Encounter for immunization: Secondary | ICD-10-CM | POA: Diagnosis not present

## 2018-06-27 DIAGNOSIS — R944 Abnormal results of kidney function studies: Secondary | ICD-10-CM | POA: Diagnosis not present

## 2018-06-27 DIAGNOSIS — I509 Heart failure, unspecified: Secondary | ICD-10-CM | POA: Diagnosis not present

## 2018-07-06 DIAGNOSIS — I129 Hypertensive chronic kidney disease with stage 1 through stage 4 chronic kidney disease, or unspecified chronic kidney disease: Secondary | ICD-10-CM | POA: Diagnosis not present

## 2018-07-13 DIAGNOSIS — I129 Hypertensive chronic kidney disease with stage 1 through stage 4 chronic kidney disease, or unspecified chronic kidney disease: Secondary | ICD-10-CM | POA: Diagnosis not present

## 2018-07-18 DIAGNOSIS — L309 Dermatitis, unspecified: Secondary | ICD-10-CM | POA: Diagnosis not present

## 2018-07-27 ENCOUNTER — Other Ambulatory Visit: Payer: Self-pay | Admitting: Cardiovascular Disease

## 2018-08-05 DIAGNOSIS — I129 Hypertensive chronic kidney disease with stage 1 through stage 4 chronic kidney disease, or unspecified chronic kidney disease: Secondary | ICD-10-CM | POA: Diagnosis not present

## 2018-08-12 DIAGNOSIS — I129 Hypertensive chronic kidney disease with stage 1 through stage 4 chronic kidney disease, or unspecified chronic kidney disease: Secondary | ICD-10-CM | POA: Diagnosis not present

## 2018-08-21 DIAGNOSIS — I509 Heart failure, unspecified: Secondary | ICD-10-CM | POA: Diagnosis not present

## 2018-08-21 DIAGNOSIS — E1129 Type 2 diabetes mellitus with other diabetic kidney complication: Secondary | ICD-10-CM | POA: Diagnosis not present

## 2018-08-21 DIAGNOSIS — I129 Hypertensive chronic kidney disease with stage 1 through stage 4 chronic kidney disease, or unspecified chronic kidney disease: Secondary | ICD-10-CM | POA: Diagnosis not present

## 2018-08-21 DIAGNOSIS — N183 Chronic kidney disease, stage 3 (moderate): Secondary | ICD-10-CM | POA: Diagnosis not present

## 2018-08-21 DIAGNOSIS — Z79899 Other long term (current) drug therapy: Secondary | ICD-10-CM | POA: Diagnosis not present

## 2018-08-21 DIAGNOSIS — R809 Proteinuria, unspecified: Secondary | ICD-10-CM | POA: Diagnosis not present

## 2018-08-21 DIAGNOSIS — E559 Vitamin D deficiency, unspecified: Secondary | ICD-10-CM | POA: Diagnosis not present

## 2018-08-28 DIAGNOSIS — N183 Chronic kidney disease, stage 3 (moderate): Secondary | ICD-10-CM | POA: Diagnosis not present

## 2018-08-28 DIAGNOSIS — E1129 Type 2 diabetes mellitus with other diabetic kidney complication: Secondary | ICD-10-CM | POA: Diagnosis not present

## 2018-08-28 DIAGNOSIS — R809 Proteinuria, unspecified: Secondary | ICD-10-CM | POA: Diagnosis not present

## 2018-08-28 DIAGNOSIS — I501 Left ventricular failure: Secondary | ICD-10-CM | POA: Diagnosis not present

## 2018-09-05 DIAGNOSIS — I129 Hypertensive chronic kidney disease with stage 1 through stage 4 chronic kidney disease, or unspecified chronic kidney disease: Secondary | ICD-10-CM | POA: Diagnosis not present

## 2018-09-12 DIAGNOSIS — I129 Hypertensive chronic kidney disease with stage 1 through stage 4 chronic kidney disease, or unspecified chronic kidney disease: Secondary | ICD-10-CM | POA: Diagnosis not present

## 2018-09-18 DIAGNOSIS — I10 Essential (primary) hypertension: Secondary | ICD-10-CM | POA: Diagnosis not present

## 2018-09-18 DIAGNOSIS — I2510 Atherosclerotic heart disease of native coronary artery without angina pectoris: Secondary | ICD-10-CM | POA: Diagnosis not present

## 2018-09-18 DIAGNOSIS — E782 Mixed hyperlipidemia: Secondary | ICD-10-CM | POA: Diagnosis not present

## 2018-09-18 DIAGNOSIS — R944 Abnormal results of kidney function studies: Secondary | ICD-10-CM | POA: Diagnosis not present

## 2018-09-18 DIAGNOSIS — E1122 Type 2 diabetes mellitus with diabetic chronic kidney disease: Secondary | ICD-10-CM | POA: Diagnosis not present

## 2018-09-18 DIAGNOSIS — N184 Chronic kidney disease, stage 4 (severe): Secondary | ICD-10-CM | POA: Diagnosis not present

## 2018-09-25 DIAGNOSIS — I10 Essential (primary) hypertension: Secondary | ICD-10-CM | POA: Diagnosis not present

## 2018-09-25 DIAGNOSIS — E782 Mixed hyperlipidemia: Secondary | ICD-10-CM | POA: Diagnosis not present

## 2018-09-25 DIAGNOSIS — E1140 Type 2 diabetes mellitus with diabetic neuropathy, unspecified: Secondary | ICD-10-CM | POA: Diagnosis not present

## 2018-09-25 DIAGNOSIS — N184 Chronic kidney disease, stage 4 (severe): Secondary | ICD-10-CM | POA: Diagnosis not present

## 2018-09-25 DIAGNOSIS — I2510 Atherosclerotic heart disease of native coronary artery without angina pectoris: Secondary | ICD-10-CM | POA: Diagnosis not present

## 2018-10-06 DIAGNOSIS — I129 Hypertensive chronic kidney disease with stage 1 through stage 4 chronic kidney disease, or unspecified chronic kidney disease: Secondary | ICD-10-CM | POA: Diagnosis not present

## 2018-10-09 DIAGNOSIS — R2689 Other abnormalities of gait and mobility: Secondary | ICD-10-CM | POA: Diagnosis not present

## 2018-10-09 DIAGNOSIS — E1140 Type 2 diabetes mellitus with diabetic neuropathy, unspecified: Secondary | ICD-10-CM | POA: Diagnosis not present

## 2018-10-09 DIAGNOSIS — R600 Localized edema: Secondary | ICD-10-CM | POA: Diagnosis not present

## 2018-10-09 DIAGNOSIS — M1990 Unspecified osteoarthritis, unspecified site: Secondary | ICD-10-CM | POA: Diagnosis not present

## 2018-10-13 DIAGNOSIS — I129 Hypertensive chronic kidney disease with stage 1 through stage 4 chronic kidney disease, or unspecified chronic kidney disease: Secondary | ICD-10-CM | POA: Diagnosis not present

## 2018-10-20 DIAGNOSIS — L299 Pruritus, unspecified: Secondary | ICD-10-CM | POA: Diagnosis not present

## 2018-10-20 DIAGNOSIS — N289 Disorder of kidney and ureter, unspecified: Secondary | ICD-10-CM | POA: Diagnosis not present

## 2018-10-20 DIAGNOSIS — B029 Zoster without complications: Secondary | ICD-10-CM | POA: Diagnosis not present

## 2018-11-04 DIAGNOSIS — A0471 Enterocolitis due to Clostridium difficile, recurrent: Secondary | ICD-10-CM | POA: Diagnosis not present

## 2018-11-04 DIAGNOSIS — I509 Heart failure, unspecified: Secondary | ICD-10-CM | POA: Diagnosis not present

## 2018-11-04 DIAGNOSIS — I129 Hypertensive chronic kidney disease with stage 1 through stage 4 chronic kidney disease, or unspecified chronic kidney disease: Secondary | ICD-10-CM | POA: Diagnosis not present

## 2018-11-08 DIAGNOSIS — E1140 Type 2 diabetes mellitus with diabetic neuropathy, unspecified: Secondary | ICD-10-CM | POA: Diagnosis not present

## 2018-11-08 DIAGNOSIS — I10 Essential (primary) hypertension: Secondary | ICD-10-CM | POA: Diagnosis not present

## 2018-11-08 DIAGNOSIS — E782 Mixed hyperlipidemia: Secondary | ICD-10-CM | POA: Diagnosis not present

## 2018-11-08 DIAGNOSIS — N184 Chronic kidney disease, stage 4 (severe): Secondary | ICD-10-CM | POA: Diagnosis not present

## 2018-11-11 DIAGNOSIS — I129 Hypertensive chronic kidney disease with stage 1 through stage 4 chronic kidney disease, or unspecified chronic kidney disease: Secondary | ICD-10-CM | POA: Diagnosis not present

## 2018-11-11 DIAGNOSIS — I509 Heart failure, unspecified: Secondary | ICD-10-CM | POA: Diagnosis not present

## 2018-11-11 DIAGNOSIS — A0471 Enterocolitis due to Clostridium difficile, recurrent: Secondary | ICD-10-CM | POA: Diagnosis not present

## 2018-11-12 ENCOUNTER — Telehealth: Payer: Self-pay | Admitting: Cardiovascular Disease

## 2018-11-12 NOTE — Telephone Encounter (Signed)
I called and spoke with the patient about rescheduling his appointment given the [place] pandemic, in order to avoid unnecessary exposure.  He is stable from a cardiac standpoint with respect to symptoms and is very agreeable to having his appointment rescheduled to a later date and was thankful for the call.

## 2018-11-14 NOTE — Telephone Encounter (Signed)
Appointment rescheduled to 02/21/2019.

## 2018-11-16 ENCOUNTER — Ambulatory Visit: Payer: Medicare Other | Admitting: Cardiovascular Disease

## 2018-12-05 DIAGNOSIS — I129 Hypertensive chronic kidney disease with stage 1 through stage 4 chronic kidney disease, or unspecified chronic kidney disease: Secondary | ICD-10-CM | POA: Diagnosis not present

## 2018-12-05 DIAGNOSIS — I509 Heart failure, unspecified: Secondary | ICD-10-CM | POA: Diagnosis not present

## 2018-12-05 DIAGNOSIS — A0471 Enterocolitis due to Clostridium difficile, recurrent: Secondary | ICD-10-CM | POA: Diagnosis not present

## 2019-01-20 ENCOUNTER — Other Ambulatory Visit: Payer: Self-pay | Admitting: Cardiovascular Disease

## 2019-01-21 DIAGNOSIS — I10 Essential (primary) hypertension: Secondary | ICD-10-CM | POA: Diagnosis not present

## 2019-01-21 DIAGNOSIS — E1140 Type 2 diabetes mellitus with diabetic neuropathy, unspecified: Secondary | ICD-10-CM | POA: Diagnosis not present

## 2019-01-21 DIAGNOSIS — I2510 Atherosclerotic heart disease of native coronary artery without angina pectoris: Secondary | ICD-10-CM | POA: Diagnosis not present

## 2019-01-21 DIAGNOSIS — N184 Chronic kidney disease, stage 4 (severe): Secondary | ICD-10-CM | POA: Diagnosis not present

## 2019-01-21 DIAGNOSIS — E782 Mixed hyperlipidemia: Secondary | ICD-10-CM | POA: Diagnosis not present

## 2019-02-07 DIAGNOSIS — I519 Heart disease, unspecified: Secondary | ICD-10-CM | POA: Diagnosis not present

## 2019-02-07 DIAGNOSIS — E1122 Type 2 diabetes mellitus with diabetic chronic kidney disease: Secondary | ICD-10-CM | POA: Diagnosis not present

## 2019-02-07 DIAGNOSIS — I10 Essential (primary) hypertension: Secondary | ICD-10-CM | POA: Diagnosis not present

## 2019-02-07 DIAGNOSIS — E782 Mixed hyperlipidemia: Secondary | ICD-10-CM | POA: Diagnosis not present

## 2019-02-07 DIAGNOSIS — N184 Chronic kidney disease, stage 4 (severe): Secondary | ICD-10-CM | POA: Diagnosis not present

## 2019-02-12 ENCOUNTER — Telehealth: Payer: Self-pay | Admitting: Cardiovascular Disease

## 2019-02-12 DIAGNOSIS — I129 Hypertensive chronic kidney disease with stage 1 through stage 4 chronic kidney disease, or unspecified chronic kidney disease: Secondary | ICD-10-CM | POA: Diagnosis not present

## 2019-02-12 DIAGNOSIS — E1140 Type 2 diabetes mellitus with diabetic neuropathy, unspecified: Secondary | ICD-10-CM | POA: Diagnosis not present

## 2019-02-12 DIAGNOSIS — E1122 Type 2 diabetes mellitus with diabetic chronic kidney disease: Secondary | ICD-10-CM | POA: Diagnosis not present

## 2019-02-12 DIAGNOSIS — I2510 Atherosclerotic heart disease of native coronary artery without angina pectoris: Secondary | ICD-10-CM | POA: Diagnosis not present

## 2019-02-12 DIAGNOSIS — N184 Chronic kidney disease, stage 4 (severe): Secondary | ICD-10-CM | POA: Diagnosis not present

## 2019-02-12 NOTE — Telephone Encounter (Signed)
Virtual Visit Pre-Appointment Phone Call  "(Name), I am calling you today to discuss your upcoming appointment. We are currently trying to limit exposure to the virus that causes COVID-19 by seeing patients at home rather than in the office."  1. "What is the BEST phone number to call the day of the visit?" - include this in appointment notes  2. Do you have or have access to (through a family member/friend) a smartphone with video capability that we can use for your visit?" a. If yes - list this number in appt notes as cell (if different from BEST phone #) and list the appointment type as a VIDEO visit in appointment notes b. If no - list the appointment type as a PHONE visit in appointment notes  3. Confirm consent - "In the setting of the current Covid19 crisis, you are scheduled for a (phone or video) visit with your provider on (date) at (time).  Just as we do with many in-office visits, in order for you to participate in this visit, we must obtain consent.  If you'd like, I can send this to your mychart (if signed up) or email for you to review.  Otherwise, I can obtain your verbal consent now.  All virtual visits are billed to your insurance company just like a normal visit would be.  By agreeing to a virtual visit, we'd like you to understand that the technology does not allow for your provider to perform an examination, and thus may limit your provider's ability to fully assess your condition. If your provider identifies any concerns that need to be evaluated in person, we will make arrangements to do so.  Finally, though the technology is pretty good, we cannot assure that it will always work on either your or our end, and in the setting of a video visit, we may have to convert it to a phone-only visit.  In either situation, we cannot ensure that we have a secure connection.  Are you willing to proceed?" STAFF: Did the patient verbally acknowledge consent to telehealth visit? Document  YES/NO here: yes 4.   5. Advise patient to be prepared - "Two hours prior to your appointment, go ahead and check your blood pressure, pulse, oxygen saturation, and your weight (if you have the equipment to check those) and write them all down. When your visit starts, your provider will ask you for this information. If you have an Apple Watch or Kardia device, please plan to have heart rate information ready on the day of your appointment. Please have a pen and paper handy nearby the day of the visit as well."  6. Give patient instructions for MyChart download to smartphone OR Doximity/Doxy.me as below if video visit (depending on what platform provider is using)  7. Inform patient they will receive a phone call 15 minutes prior to their appointment time (may be from unknown caller ID) so they should be prepared to answer    TELEPHONE CALL NOTE  Zachary Mcmahon has been deemed a candidate for a follow-up tele-health visit to limit community exposure during the Covid-19 pandemic. I spoke with the patient via phone to ensure availability of phone/video source, confirm preferred email & phone number, and discuss instructions and expectations.  I reminded Zachary Mcmahon to be prepared with any vital sign and/or heart rhythm information that could potentially be obtained via home monitoring, at the time of his visit. I reminded Zachary Mcmahon to expect a phone call  prior to his visit.  Zachary Mcmahon 02/12/2019 4:13 PM   INSTRUCTIONS FOR DOWNLOADING THE MYCHART APP TO SMARTPHONE  - The patient must first make sure to have activated MyChart and know their login information - If Apple, go to [employer] and type in MyChart in the search bar and download the app. If Android, ask patient to go to [employer] and type in [place] in the search bar and download the app. The app is free but as with any other app downloads, their phone may require them to verify saved payment information or  Apple/Android password.  - The patient will need to then log into the app with their MyChart username and password, and select [facility] as their healthcare provider to link the account. When it is time for your visit, go to the MyChart app, find appointments, and click Begin Video Visit. Be sure to Select Allow for your device to access the Microphone and Camera for your visit. You will then be connected, and your provider will be with you shortly.  **If they have any issues connecting, or need assistance please contact MyChart service desk [phone])**  **If using a computer, in order to ensure the best quality for their visit they will need to use either of the following Internet Browsers: [employer], or Google Chrome**  IF USING DOXIMITY or DOXY.ME - The patient will receive a link just prior to their visit by text.     FULL LENGTH CONSENT FOR TELE-HEALTH VISIT   I hereby voluntarily request, consent and authorize [facility] and its employed or contracted physicians, physician assistants, nurse practitioners or other licensed health care professionals (the Practitioner), to provide me with telemedicine health care services (the Services") as deemed necessary by the treating Practitioner. I acknowledge and consent to receive the Services by the Practitioner via telemedicine. I understand that the telemedicine visit will involve communicating with the Practitioner through live audiovisual communication technology and the disclosure of certain medical information by electronic transmission. I acknowledge that I have been given the opportunity to request an in-person assessment or other available alternative prior to the telemedicine visit and am voluntarily participating in the telemedicine visit.  I understand that I have the right to withhold or withdraw my consent to the use of telemedicine in the course of my care at any time, without affecting my right to future care  or treatment, and that the Practitioner or I may terminate the telemedicine visit at any time. I understand that I have the right to inspect all information obtained and/or recorded in the course of the telemedicine visit and may receive copies of available information for a reasonable fee.  I understand that some of the potential risks of receiving the Services via telemedicine include:   Delay or interruption in medical evaluation due to technological equipment failure or disruption;  Information transmitted may not be sufficient (e.g. poor resolution of images) to allow for appropriate medical decision making by the Practitioner; and/or   In rare instances, security protocols could fail, causing a breach of personal health information.  Furthermore, I acknowledge that it is my responsibility to provide information about my medical history, conditions and care that is complete and accurate to the best of my ability. I acknowledge that Practitioner's advice, recommendations, and/or decision may be based on factors not within their control, such as incomplete or inaccurate data provided by me or distortions of diagnostic images or specimens that may result from electronic  transmissions. I understand that the practice of medicine is not an exact science and that Practitioner makes no warranties or guarantees regarding treatment outcomes. I acknowledge that I will receive a copy of this consent concurrently upon execution via email to the email address I last provided but may also request a printed copy by calling the office of [facility].    I understand that my insurance will be billed for this visit.   I have read or had this consent read to me.  I understand the contents of this consent, which adequately explains the benefits and risks of the Services being provided via telemedicine.   I have been provided ample opportunity to ask questions regarding this consent and the Services and have had  my questions answered to my satisfaction.  I give my informed consent for the services to be provided through the use of telemedicine in my medical care  By participating in this telemedicine visit I agree to the above.

## 2019-02-21 ENCOUNTER — Telehealth (INDEPENDENT_AMBULATORY_CARE_PROVIDER_SITE_OTHER): Payer: Medicare Other | Admitting: Cardiovascular Disease

## 2019-02-21 ENCOUNTER — Encounter: Payer: Self-pay | Admitting: Cardiovascular Disease

## 2019-02-21 VITALS — BP 129/68 | HR 60 | Ht 69.0 in | Wt 200.0 lb

## 2019-02-21 DIAGNOSIS — I5042 Chronic combined systolic (congestive) and diastolic (congestive) heart failure: Secondary | ICD-10-CM

## 2019-02-21 DIAGNOSIS — I10 Essential (primary) hypertension: Secondary | ICD-10-CM

## 2019-02-21 DIAGNOSIS — I25708 Atherosclerosis of coronary artery bypass graft(s), unspecified, with other forms of angina pectoris: Secondary | ICD-10-CM

## 2019-02-21 DIAGNOSIS — R600 Localized edema: Secondary | ICD-10-CM

## 2019-02-21 DIAGNOSIS — E785 Hyperlipidemia, unspecified: Secondary | ICD-10-CM | POA: Diagnosis not present

## 2019-02-21 NOTE — Patient Instructions (Addendum)

## 2019-02-21 NOTE — Progress Notes (Signed)
Virtual Visit via Telephone Note   This visit type was conducted due to national recommendations for restrictions regarding the COVID-19 Pandemic (e.g. social distancing) in an effort to limit this patient's exposure and mitigate transmission in our community.  Due to his co-morbid illnesses, this patient is at least at moderate risk for complications without adequate follow up.  This format is felt to be most appropriate for this patient at this time.  The patient did not have access to video technology/had technical difficulties with video requiring transitioning to audio format only (telephone).  All issues noted in this document were discussed and addressed.  No physical exam could be performed with this format.  Please refer to the patient's chart for his  consent to telehealth for [facility].   Date:  02/21/2019   ID:  Zachary Mcmahon, DOB [birth year], MRN [id number]  Patient Location: Home Provider Location: Office  PCP:  Celene Squibb, MD  Cardiologist:  Kate Sable, MD  Electrophysiologist:  None   Evaluation Performed:  Follow-Up Visit  Chief Complaint: Coronary artery disease and CHF  History of Present Illness:    Zachary Mcmahon is a 85 y.o. male with a history of coronary artery disease and chronic combined heart failure.  He underwent 5 vessel CABG on 02/17/2015.  He has a prior history of pulmonary embolism.  He has chronic lower extremity edema.  He complains of bilateral feet neuropathy.  He denies chest pain, shortness of breath, and palpitations. Chronic leg swelling is stable. He wears compression stockings.  The patient does not have symptoms concerning for COVID-19 infection (fever, chills, cough, or new shortness of breath).    Past Medical History:  Diagnosis Date  . Arthritis    "legs" (02/07/2015)  . Basal cell carcinoma of forehead 2016 X 2  . Basal cell carcinoma of left earlobe   . CHF (congestive heart failure) ([place])   . Diabetic neuropathy  ([place]) 09/04/2017  . Diabetic peripheral neuropathy ([place])    "left foot" (02/07/2015)  . 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 GERD (gastroesophageal reflux disease)   . High potassium   . History of gout X 1  . Hyperlipidemia   . Hypertension   . Old myocardial infarct    "sometime in the past; don't know when" (02/07/2015)  . Pain and swelling of left lower leg    chronic  . Pneumonia X 1  . Renal insufficiency   . Respiratory failure ([place])   . Type II diabetes mellitus ([place])   . Varicose veins    Past Surgical History:  Procedure Laterality Date  . BASAL CELL CARCINOMA EXCISION     "probably 1/2 dozen cut off face, left ear" (02/07/2015)  . CARDIAC CATHETERIZATION N/A 02/09/2015   Procedure: Left Heart Cath and Coronary Angiography;  Surgeon: Leonie Man, MD;  Location: [facility];  Service: Cardiovascular;  Laterality: N/A;  . CARDIOVASCULAR STRESS TEST  05/29/2012   Mild-moderate perfusion defect due to infarct/scar with mild-moderate perinfarct ischemia seen in the mid anterior, apical anterior, apical septal, and apical regions. No ECG changes. Global LV systolic function is severely reduced.  Marland Kitchen CATARACT EXTRACTION W/PHACO Left 11/12/2015   Procedure: CATARACT EXTRACTION PHACO AND INTRAOCULAR LENS PLACEMENT LEFT EYE;  Surgeon: Tonny Branch, MD;  Location: [facility];  Service: Ophthalmology;  Laterality: Left;  CDE: 9.82  . CATARACT EXTRACTION W/PHACO Right 12/14/2015   Procedure: CATARACT EXTRACTION PHACO AND INTRAOCULAR LENS PLACEMENT (IOC);  Surgeon: Tonny Branch, MD;  Location: [facility];  Service: Ophthalmology;  Laterality: Right;  CDE: 13.86  . CORONARY ARTERY BYPASS GRAFT N/A 02/17/2015   Procedure: CORONARY ARTERY BYPASS GRAFTING (CABG), ON PUMP, TIMES FIVE, USING LEFT INTERNAL MAMMARY ARTERY, RIGHT GREATER SAPHENOUS VEIN HARVESTED ENDOSCOPICALLY;  Surgeon: [name], MD;  Location: [facility];  Service: Open Heart Surgery;  Laterality: N/A;  -LIMA to LAD  -SVG to DIAGONAL - SEQ SVG to OM1 and PLB -SVG to PDA  . CYSTOSCOPY W/ STONE MANIPULATION  X 1  . FRACTURE SURGERY    . KNEE ARTHROSCOPY Right ~ 2008  . LEXISCAM MYOCARDIAL PERFUSION  05/29/12   MARKED PERFUSION DEFECT DUE TO INFARC/SCAR WITH MILD PERINFARCT ISCHEMIA IN THE BASAL INFERIOR, MID INFEROSEPTAL, MID INFERIOR AND APICALINFERIOR REGION. EF%33%. PERIINFARCT ISCHEMIA IN THE MID ANTERIOR, APICAL ANTERIOR, APICAL SEPTAL AND APICAL REGIONS.  . LOWER EXTREMITY VENOUS DOPPLER  07/11/2012   No evidence of DVT in the left lower extremity. Evidence of partially recanalized, chronic, non-obstructive thrombus in the left great SV and its branches consistent with significant reflux consistent with post-phlebitic syndrome. Significant reflux of the left short saphenous vein.  Marland Kitchen open heart sx  02/17/15  . ORIF HIP FRACTURE  03/20/2012   Procedure: OPEN REDUCTION INTERNAL FIXATION HIP;  Surgeon: Sanjuana Kava, MD;  Location: [facility];  Service: Orthopedics;  Laterality: Right;  . TEE WITHOUT CARDIOVERSION N/A 02/17/2015   Procedure: TRANSESOPHAGEAL ECHOCARDIOGRAM (TEE);  Surgeon: [name], MD;  Location: [facility];  Service: Open Heart Surgery;  Laterality: N/A;  . TRANSTHORACIC ECHOCARDIOGRAM  04/18/2012   EF 45%, mild-moderate LVH  . TRANSTHORACIC ECHOCARDIOGRAM  04/18/12   EF% 45%.SEVERE HYPOKINESIS TO AKINESIS OF THE MID-DISTAL INFEROLATERAL MYOCARDIUM AND MUCH OF THE APEX.     Current Meds  Medication Sig  . acetaminophen (TYLENOL) 500 MG tablet Take 500 mg by mouth every 6 (six) hours as needed for mild pain.  Marland Kitchen amLODipine (NORVASC) 5 MG tablet TAKE 1 TABLET BY MOUTH EVERY DAY  . atorvastatin (LIPITOR) 20 MG tablet Take 1 tablet by mouth daily.  . carvedilol (COREG) 6.25 MG tablet TAKE 1 TABLET BY MOUTH TWICE DAILY  . cholecalciferol (VITAMIN D) 1000 units tablet Take 1,000 Units by mouth daily.  . ferrous sulfate 325 (65 FE) MG tablet Take 1 tablet (325 mg total) by mouth daily with  breakfast.  . furosemide (LASIX) 20 MG tablet Take 20 mg by mouth daily.  Marland Kitchen losartan (COZAAR) 25 MG tablet Take 25 mg by mouth daily.  Marland Kitchen omeprazole (PRILOSEC) 20 MG capsule Take 20 mg by mouth daily.  Marland Kitchen OVER THE [place] extra strength probiotic daily  . traMADol (ULTRAM) 50 MG tablet Take 50 mg by mouth every 6 (six) hours as needed.  . [DISCONTINUED] gabapentin (NEURONTIN) 100 MG capsule Take 100 mg by mouth at bedtime.  . [DISCONTINUED] traMADol (ULTRAM) 50 MG tablet Take 50 mg by mouth every 6 (six) hours as needed for moderate pain.     Allergies:   Eliquis [apixaban], Bactrim [sulfamethoxazole-trimethoprim], and Hydralazine   Social History   Tobacco Use  . Smoking status: Never Smoker  . Smokeless tobacco: Never Used  Substance Use Topics  . Alcohol use: No    Alcohol/week: 0.0 standard drinks  . Drug use: No     Family Hx: The patient's family history includes Deep vein thrombosis in his mother; Diabetes in his brother; Heart attack in his brother; Hyperlipidemia in his brother; Hypertension  in his brother, mother, and sister. There is no history of Colon cancer.  ROS:   Please see the history of present illness.     All other systems reviewed and are negative.   Prior CV studies:   The following studies were reviewed today:  Nuclear stress test on 10/23/15 demonstrated a large defect involving the inferoseptal, inferior, and lateral wall extending from the apex to the base with some degree of reversibility suggestive of moderate lateral ischemia. It was deemed a high risk study.  Echocardiogram performed on 10/04/15 demonstrated mild to moderately reduced left ventricular systolic function, EF 57-84%, with inferior wall hypokinesis, grade 2 diastolic dysfunction , and mild to moderate mitral regurgitation.   Labs/Other Tests and Data Reviewed:    EKG:  No ECG reviewed.  Recent Labs: No results found for requested labs within last 8760 hours.    Recent Lipid Panel Lab Results  Component Value Date/Time   CHOL 83 02/07/2015 05:33 AM   TRIG 91 02/07/2015 05:33 AM   HDL 17 (L) 02/07/2015 05:33 AM   CHOLHDL 4.9 02/07/2015 05:33 AM   LDLCALC 48 02/07/2015 05:33 AM    Wt Readings from Last 3 Encounters:  02/21/19 200 lb (90.7 kg)  05/01/18 190 lb (86.2 kg)  10/31/17 207 lb (93.9 kg)     Objective:    Vital Signs:  BP 129/68   Pulse 60   Ht 5\' 9"  (1.753 m)   Wt 200 lb (90.7 kg)   BMI 29.53 kg/m    VITAL SIGNS:  reviewed  ASSESSMENT & PLAN:    1. Chronic combined systolic and diastolic heart failure:Symptomatically stable. Continue Lasix 40 mg daily and Coreg.He prefers taking Lasix20-40 mg only every morning due to urinary incontinence. He also wears compression stockings. I am unable to add ACE inhibitors, angiotensin receptor blockers, or angiotensin receptor-neprilysin inhibitors due to advanced chronic kidney disease.  2. CAD s/p 5-v CABG: Symptomatically stable.  On Lipitor.Not on ASA  due to recurrent nosebleeds and chronic anemia.Lexiscan Cardiolite indicative of ischemic etiology for EF reduction. Continue Coreg and atorvastatin. We previously discussed coronary angiography and he didnot want to proceed.  3. Essential HTN:BP is normal. No changes.  4. Hyperlipidemia: Continue atorvastatin. LDL 45 on 09/18/18.  5. CKD stage III: Creatinine 1.83 in January 2020.  6. Anemia: Hgb 9.4 in January 2020.   COVID-19 Education: The signs and symptoms of COVID-19 were discussed with the patient and how to seek care for testing (follow up with PCP or arrange E-visit).  The importance of social distancing was discussed today.  Time:   Today, I have spent 15 minutes with the patient with telehealth technology discussing the above problems.     Medication Adjustments/Labs and Tests Ordered: Current medicines are reviewed at length with the patient today.  Concerns regarding medicines are outlined above.    Tests Ordered: No orders of the defined types were placed in this encounter.   Medication Changes: No orders of the defined types were placed in this encounter.   Follow Up:  Virtual Visit or In Person in 6 month(s)  Signed, Kate Sable, MD  02/21/2019 10:36 AM    [place]

## 2019-02-26 DIAGNOSIS — N184 Chronic kidney disease, stage 4 (severe): Secondary | ICD-10-CM | POA: Diagnosis not present

## 2019-02-26 DIAGNOSIS — I2510 Atherosclerotic heart disease of native coronary artery without angina pectoris: Secondary | ICD-10-CM | POA: Diagnosis not present

## 2019-02-26 DIAGNOSIS — E782 Mixed hyperlipidemia: Secondary | ICD-10-CM | POA: Diagnosis not present

## 2019-02-26 DIAGNOSIS — E1140 Type 2 diabetes mellitus with diabetic neuropathy, unspecified: Secondary | ICD-10-CM | POA: Diagnosis not present

## 2019-02-26 DIAGNOSIS — I10 Essential (primary) hypertension: Secondary | ICD-10-CM | POA: Diagnosis not present

## 2019-04-04 DIAGNOSIS — E782 Mixed hyperlipidemia: Secondary | ICD-10-CM | POA: Diagnosis not present

## 2019-04-04 DIAGNOSIS — E1140 Type 2 diabetes mellitus with diabetic neuropathy, unspecified: Secondary | ICD-10-CM | POA: Diagnosis not present

## 2019-04-04 DIAGNOSIS — N184 Chronic kidney disease, stage 4 (severe): Secondary | ICD-10-CM | POA: Diagnosis not present

## 2019-04-04 DIAGNOSIS — I2510 Atherosclerotic heart disease of native coronary artery without angina pectoris: Secondary | ICD-10-CM | POA: Diagnosis not present

## 2019-04-04 DIAGNOSIS — I10 Essential (primary) hypertension: Secondary | ICD-10-CM | POA: Diagnosis not present

## 2019-04-18 ENCOUNTER — Other Ambulatory Visit: Payer: Self-pay | Admitting: Cardiovascular Disease

## 2019-05-23 DIAGNOSIS — I10 Essential (primary) hypertension: Secondary | ICD-10-CM | POA: Diagnosis not present

## 2019-05-23 DIAGNOSIS — I2510 Atherosclerotic heart disease of native coronary artery without angina pectoris: Secondary | ICD-10-CM | POA: Diagnosis not present

## 2019-05-23 DIAGNOSIS — E1140 Type 2 diabetes mellitus with diabetic neuropathy, unspecified: Secondary | ICD-10-CM | POA: Diagnosis not present

## 2019-05-23 DIAGNOSIS — E782 Mixed hyperlipidemia: Secondary | ICD-10-CM | POA: Diagnosis not present

## 2019-05-23 DIAGNOSIS — N184 Chronic kidney disease, stage 4 (severe): Secondary | ICD-10-CM | POA: Diagnosis not present

## 2019-06-17 DIAGNOSIS — E1140 Type 2 diabetes mellitus with diabetic neuropathy, unspecified: Secondary | ICD-10-CM | POA: Diagnosis not present

## 2019-06-17 DIAGNOSIS — E782 Mixed hyperlipidemia: Secondary | ICD-10-CM | POA: Diagnosis not present

## 2019-06-17 DIAGNOSIS — D638 Anemia in other chronic diseases classified elsewhere: Secondary | ICD-10-CM | POA: Diagnosis not present

## 2019-06-17 DIAGNOSIS — I10 Essential (primary) hypertension: Secondary | ICD-10-CM | POA: Diagnosis not present

## 2019-06-17 DIAGNOSIS — E1122 Type 2 diabetes mellitus with diabetic chronic kidney disease: Secondary | ICD-10-CM | POA: Diagnosis not present

## 2019-06-24 DIAGNOSIS — N184 Chronic kidney disease, stage 4 (severe): Secondary | ICD-10-CM | POA: Diagnosis not present

## 2019-06-24 DIAGNOSIS — E1122 Type 2 diabetes mellitus with diabetic chronic kidney disease: Secondary | ICD-10-CM | POA: Diagnosis not present

## 2019-06-24 DIAGNOSIS — I2510 Atherosclerotic heart disease of native coronary artery without angina pectoris: Secondary | ICD-10-CM | POA: Diagnosis not present

## 2019-06-24 DIAGNOSIS — E1140 Type 2 diabetes mellitus with diabetic neuropathy, unspecified: Secondary | ICD-10-CM | POA: Diagnosis not present

## 2019-06-24 DIAGNOSIS — I129 Hypertensive chronic kidney disease with stage 1 through stage 4 chronic kidney disease, or unspecified chronic kidney disease: Secondary | ICD-10-CM | POA: Diagnosis not present

## 2019-06-25 DIAGNOSIS — N184 Chronic kidney disease, stage 4 (severe): Secondary | ICD-10-CM | POA: Diagnosis not present

## 2019-06-25 DIAGNOSIS — E782 Mixed hyperlipidemia: Secondary | ICD-10-CM | POA: Diagnosis not present

## 2019-06-25 DIAGNOSIS — E1140 Type 2 diabetes mellitus with diabetic neuropathy, unspecified: Secondary | ICD-10-CM | POA: Diagnosis not present

## 2019-06-25 DIAGNOSIS — I10 Essential (primary) hypertension: Secondary | ICD-10-CM | POA: Diagnosis not present

## 2019-08-22 ENCOUNTER — Ambulatory Visit (HOSPITAL_COMMUNITY)
Admission: RE | Admit: 2019-08-22 | Discharge: 2019-08-22 | Disposition: A | Discharge status: Home / Self Care | Payer: Medicare Other | Source: Ambulatory Visit | Attending: Internal Medicine | Admitting: Internal Medicine

## 2019-08-22 ENCOUNTER — Other Ambulatory Visit: Payer: Self-pay

## 2019-08-22 ENCOUNTER — Other Ambulatory Visit (HOSPITAL_COMMUNITY): Payer: Self-pay | Admitting: Internal Medicine

## 2019-08-22 DIAGNOSIS — W19XXXA Unspecified fall, initial encounter: Secondary | ICD-10-CM | POA: Diagnosis not present

## 2019-08-22 DIAGNOSIS — M25571 Pain in right ankle and joints of right foot: Secondary | ICD-10-CM | POA: Diagnosis not present

## 2019-08-22 DIAGNOSIS — S99921A Unspecified injury of right foot, initial encounter: Secondary | ICD-10-CM | POA: Diagnosis not present

## 2019-08-22 DIAGNOSIS — M79671 Pain in right foot: Secondary | ICD-10-CM | POA: Diagnosis not present

## 2019-08-28 DIAGNOSIS — R262 Difficulty in walking, not elsewhere classified: Secondary | ICD-10-CM | POA: Diagnosis not present

## 2019-08-28 DIAGNOSIS — Z9181 History of falling: Secondary | ICD-10-CM | POA: Diagnosis not present

## 2019-08-28 DIAGNOSIS — M25571 Pain in right ankle and joints of right foot: Secondary | ICD-10-CM | POA: Diagnosis not present

## 2019-08-28 DIAGNOSIS — M1711 Unilateral primary osteoarthritis, right knee: Secondary | ICD-10-CM | POA: Diagnosis not present

## 2019-08-28 DIAGNOSIS — N189 Chronic kidney disease, unspecified: Secondary | ICD-10-CM | POA: Diagnosis not present

## 2019-08-28 DIAGNOSIS — Z79891 Long term (current) use of opiate analgesic: Secondary | ICD-10-CM | POA: Diagnosis not present

## 2019-08-28 DIAGNOSIS — I509 Heart failure, unspecified: Secondary | ICD-10-CM | POA: Diagnosis not present

## 2019-08-30 DIAGNOSIS — Z79891 Long term (current) use of opiate analgesic: Secondary | ICD-10-CM | POA: Diagnosis not present

## 2019-08-30 DIAGNOSIS — N189 Chronic kidney disease, unspecified: Secondary | ICD-10-CM | POA: Diagnosis not present

## 2019-08-30 DIAGNOSIS — Z9181 History of falling: Secondary | ICD-10-CM | POA: Diagnosis not present

## 2019-08-30 DIAGNOSIS — M1711 Unilateral primary osteoarthritis, right knee: Secondary | ICD-10-CM | POA: Diagnosis not present

## 2019-08-30 DIAGNOSIS — M25571 Pain in right ankle and joints of right foot: Secondary | ICD-10-CM | POA: Diagnosis not present

## 2019-08-30 DIAGNOSIS — I509 Heart failure, unspecified: Secondary | ICD-10-CM | POA: Diagnosis not present

## 2019-08-30 DIAGNOSIS — R262 Difficulty in walking, not elsewhere classified: Secondary | ICD-10-CM | POA: Diagnosis not present

## 2019-09-03 DIAGNOSIS — Z9181 History of falling: Secondary | ICD-10-CM | POA: Diagnosis not present

## 2019-09-03 DIAGNOSIS — I509 Heart failure, unspecified: Secondary | ICD-10-CM | POA: Diagnosis not present

## 2019-09-03 DIAGNOSIS — R262 Difficulty in walking, not elsewhere classified: Secondary | ICD-10-CM | POA: Diagnosis not present

## 2019-09-03 DIAGNOSIS — N189 Chronic kidney disease, unspecified: Secondary | ICD-10-CM | POA: Diagnosis not present

## 2019-09-03 DIAGNOSIS — Z79891 Long term (current) use of opiate analgesic: Secondary | ICD-10-CM | POA: Diagnosis not present

## 2019-09-03 DIAGNOSIS — M1711 Unilateral primary osteoarthritis, right knee: Secondary | ICD-10-CM | POA: Diagnosis not present

## 2019-09-03 DIAGNOSIS — M25571 Pain in right ankle and joints of right foot: Secondary | ICD-10-CM | POA: Diagnosis not present

## 2019-09-04 DIAGNOSIS — N189 Chronic kidney disease, unspecified: Secondary | ICD-10-CM | POA: Diagnosis not present

## 2019-09-04 DIAGNOSIS — M25571 Pain in right ankle and joints of right foot: Secondary | ICD-10-CM | POA: Diagnosis not present

## 2019-09-04 DIAGNOSIS — Z79891 Long term (current) use of opiate analgesic: Secondary | ICD-10-CM | POA: Diagnosis not present

## 2019-09-04 DIAGNOSIS — R262 Difficulty in walking, not elsewhere classified: Secondary | ICD-10-CM | POA: Diagnosis not present

## 2019-09-04 DIAGNOSIS — Z9181 History of falling: Secondary | ICD-10-CM | POA: Diagnosis not present

## 2019-09-04 DIAGNOSIS — I509 Heart failure, unspecified: Secondary | ICD-10-CM | POA: Diagnosis not present

## 2019-09-04 DIAGNOSIS — M1711 Unilateral primary osteoarthritis, right knee: Secondary | ICD-10-CM | POA: Diagnosis not present

## 2019-09-09 DIAGNOSIS — N189 Chronic kidney disease, unspecified: Secondary | ICD-10-CM | POA: Diagnosis not present

## 2019-09-09 DIAGNOSIS — M1711 Unilateral primary osteoarthritis, right knee: Secondary | ICD-10-CM | POA: Diagnosis not present

## 2019-09-09 DIAGNOSIS — I509 Heart failure, unspecified: Secondary | ICD-10-CM | POA: Diagnosis not present

## 2019-09-09 DIAGNOSIS — Z9181 History of falling: Secondary | ICD-10-CM | POA: Diagnosis not present

## 2019-09-09 DIAGNOSIS — M25571 Pain in right ankle and joints of right foot: Secondary | ICD-10-CM | POA: Diagnosis not present

## 2019-09-09 DIAGNOSIS — Z79891 Long term (current) use of opiate analgesic: Secondary | ICD-10-CM | POA: Diagnosis not present

## 2019-09-09 DIAGNOSIS — R262 Difficulty in walking, not elsewhere classified: Secondary | ICD-10-CM | POA: Diagnosis not present

## 2019-09-12 DIAGNOSIS — I129 Hypertensive chronic kidney disease with stage 1 through stage 4 chronic kidney disease, or unspecified chronic kidney disease: Secondary | ICD-10-CM | POA: Diagnosis not present

## 2019-09-12 DIAGNOSIS — I2510 Atherosclerotic heart disease of native coronary artery without angina pectoris: Secondary | ICD-10-CM | POA: Diagnosis not present

## 2019-09-12 DIAGNOSIS — E1122 Type 2 diabetes mellitus with diabetic chronic kidney disease: Secondary | ICD-10-CM | POA: Diagnosis not present

## 2019-09-12 DIAGNOSIS — E1140 Type 2 diabetes mellitus with diabetic neuropathy, unspecified: Secondary | ICD-10-CM | POA: Diagnosis not present

## 2019-09-12 DIAGNOSIS — N184 Chronic kidney disease, stage 4 (severe): Secondary | ICD-10-CM | POA: Diagnosis not present

## 2019-09-13 DIAGNOSIS — R262 Difficulty in walking, not elsewhere classified: Secondary | ICD-10-CM | POA: Diagnosis not present

## 2019-09-13 DIAGNOSIS — I509 Heart failure, unspecified: Secondary | ICD-10-CM | POA: Diagnosis not present

## 2019-09-13 DIAGNOSIS — N189 Chronic kidney disease, unspecified: Secondary | ICD-10-CM | POA: Diagnosis not present

## 2019-09-13 DIAGNOSIS — Z9181 History of falling: Secondary | ICD-10-CM | POA: Diagnosis not present

## 2019-09-13 DIAGNOSIS — M25571 Pain in right ankle and joints of right foot: Secondary | ICD-10-CM | POA: Diagnosis not present

## 2019-09-13 DIAGNOSIS — M1711 Unilateral primary osteoarthritis, right knee: Secondary | ICD-10-CM | POA: Diagnosis not present

## 2019-09-13 DIAGNOSIS — Z79891 Long term (current) use of opiate analgesic: Secondary | ICD-10-CM | POA: Diagnosis not present

## 2019-09-16 DIAGNOSIS — I509 Heart failure, unspecified: Secondary | ICD-10-CM | POA: Diagnosis not present

## 2019-09-16 DIAGNOSIS — N189 Chronic kidney disease, unspecified: Secondary | ICD-10-CM | POA: Diagnosis not present

## 2019-09-16 DIAGNOSIS — R262 Difficulty in walking, not elsewhere classified: Secondary | ICD-10-CM | POA: Diagnosis not present

## 2019-09-16 DIAGNOSIS — M25571 Pain in right ankle and joints of right foot: Secondary | ICD-10-CM | POA: Diagnosis not present

## 2019-09-16 DIAGNOSIS — M1711 Unilateral primary osteoarthritis, right knee: Secondary | ICD-10-CM | POA: Diagnosis not present

## 2019-09-16 DIAGNOSIS — Z9181 History of falling: Secondary | ICD-10-CM | POA: Diagnosis not present

## 2019-09-16 DIAGNOSIS — Z79891 Long term (current) use of opiate analgesic: Secondary | ICD-10-CM | POA: Diagnosis not present

## 2019-09-18 DIAGNOSIS — Z9181 History of falling: Secondary | ICD-10-CM | POA: Diagnosis not present

## 2019-09-18 DIAGNOSIS — R262 Difficulty in walking, not elsewhere classified: Secondary | ICD-10-CM | POA: Diagnosis not present

## 2019-09-18 DIAGNOSIS — Z79891 Long term (current) use of opiate analgesic: Secondary | ICD-10-CM | POA: Diagnosis not present

## 2019-09-18 DIAGNOSIS — M25571 Pain in right ankle and joints of right foot: Secondary | ICD-10-CM | POA: Diagnosis not present

## 2019-09-18 DIAGNOSIS — I509 Heart failure, unspecified: Secondary | ICD-10-CM | POA: Diagnosis not present

## 2019-09-18 DIAGNOSIS — M1711 Unilateral primary osteoarthritis, right knee: Secondary | ICD-10-CM | POA: Diagnosis not present

## 2019-09-18 DIAGNOSIS — N189 Chronic kidney disease, unspecified: Secondary | ICD-10-CM | POA: Diagnosis not present

## 2019-10-15 ENCOUNTER — Other Ambulatory Visit: Payer: Self-pay | Admitting: Cardiovascular Disease

## 2019-10-17 DIAGNOSIS — I129 Hypertensive chronic kidney disease with stage 1 through stage 4 chronic kidney disease, or unspecified chronic kidney disease: Secondary | ICD-10-CM | POA: Diagnosis not present

## 2019-10-17 DIAGNOSIS — I2510 Atherosclerotic heart disease of native coronary artery without angina pectoris: Secondary | ICD-10-CM | POA: Diagnosis not present

## 2019-10-17 DIAGNOSIS — E1122 Type 2 diabetes mellitus with diabetic chronic kidney disease: Secondary | ICD-10-CM | POA: Diagnosis not present

## 2019-10-17 DIAGNOSIS — N184 Chronic kidney disease, stage 4 (severe): Secondary | ICD-10-CM | POA: Diagnosis not present

## 2019-10-17 DIAGNOSIS — E1140 Type 2 diabetes mellitus with diabetic neuropathy, unspecified: Secondary | ICD-10-CM | POA: Diagnosis not present

## 2019-11-11 DIAGNOSIS — I2510 Atherosclerotic heart disease of native coronary artery without angina pectoris: Secondary | ICD-10-CM | POA: Diagnosis not present

## 2019-11-11 DIAGNOSIS — I10 Essential (primary) hypertension: Secondary | ICD-10-CM | POA: Diagnosis not present

## 2019-11-11 DIAGNOSIS — R944 Abnormal results of kidney function studies: Secondary | ICD-10-CM | POA: Diagnosis not present

## 2019-11-11 DIAGNOSIS — Z712 Person consulting for explanation of examination or test findings: Secondary | ICD-10-CM | POA: Diagnosis not present

## 2019-11-11 DIAGNOSIS — I509 Heart failure, unspecified: Secondary | ICD-10-CM | POA: Diagnosis not present

## 2019-11-13 DIAGNOSIS — I129 Hypertensive chronic kidney disease with stage 1 through stage 4 chronic kidney disease, or unspecified chronic kidney disease: Secondary | ICD-10-CM | POA: Diagnosis not present

## 2019-11-13 DIAGNOSIS — I2510 Atherosclerotic heart disease of native coronary artery without angina pectoris: Secondary | ICD-10-CM | POA: Diagnosis not present

## 2019-11-13 DIAGNOSIS — N184 Chronic kidney disease, stage 4 (severe): Secondary | ICD-10-CM | POA: Diagnosis not present

## 2019-11-13 DIAGNOSIS — E1122 Type 2 diabetes mellitus with diabetic chronic kidney disease: Secondary | ICD-10-CM | POA: Diagnosis not present

## 2019-11-13 DIAGNOSIS — E1140 Type 2 diabetes mellitus with diabetic neuropathy, unspecified: Secondary | ICD-10-CM | POA: Diagnosis not present

## 2019-11-19 ENCOUNTER — Encounter (HOSPITAL_COMMUNITY): Payer: Medicare Other

## 2019-11-20 DIAGNOSIS — E211 Secondary hyperparathyroidism, not elsewhere classified: Secondary | ICD-10-CM | POA: Diagnosis not present

## 2019-11-20 DIAGNOSIS — N189 Chronic kidney disease, unspecified: Secondary | ICD-10-CM | POA: Diagnosis not present

## 2019-11-20 DIAGNOSIS — R809 Proteinuria, unspecified: Secondary | ICD-10-CM | POA: Diagnosis not present

## 2019-11-20 DIAGNOSIS — D631 Anemia in chronic kidney disease: Secondary | ICD-10-CM | POA: Diagnosis not present

## 2019-11-20 DIAGNOSIS — D508 Other iron deficiency anemias: Secondary | ICD-10-CM | POA: Diagnosis not present

## 2019-11-21 ENCOUNTER — Emergency Department (HOSPITAL_COMMUNITY)
Admission: EM | Admit: 2019-11-21 | Discharge: 2019-11-22 | Disposition: A | Discharge status: Home / Self Care | Payer: Medicare Other | Attending: Emergency Medicine | Admitting: Emergency Medicine

## 2019-11-21 ENCOUNTER — Other Ambulatory Visit: Payer: Self-pay

## 2019-11-21 ENCOUNTER — Encounter (HOSPITAL_COMMUNITY)
Admission: RE | Admit: 2019-11-21 | Discharge: 2019-11-21 | Disposition: A | Discharge status: Home / Self Care | Payer: Medicare Other | Source: Ambulatory Visit | Attending: Nephrology | Admitting: Nephrology

## 2019-11-21 ENCOUNTER — Encounter (HOSPITAL_COMMUNITY): Payer: Self-pay

## 2019-11-21 ENCOUNTER — Encounter (HOSPITAL_COMMUNITY)
Admission: RE | Admit: 2019-11-21 | Discharge: 2019-11-21 | Disposition: A | Discharge status: Home / Self Care | Payer: Medicare Other | Source: Ambulatory Visit | Attending: Internal Medicine | Admitting: Internal Medicine

## 2019-11-21 DIAGNOSIS — E1122 Type 2 diabetes mellitus with diabetic chronic kidney disease: Secondary | ICD-10-CM | POA: Insufficient documentation

## 2019-11-21 DIAGNOSIS — I509 Heart failure, unspecified: Secondary | ICD-10-CM | POA: Diagnosis not present

## 2019-11-21 DIAGNOSIS — Z79899 Other long term (current) drug therapy: Secondary | ICD-10-CM | POA: Insufficient documentation

## 2019-11-21 DIAGNOSIS — D631 Anemia in chronic kidney disease: Secondary | ICD-10-CM | POA: Diagnosis present

## 2019-11-21 DIAGNOSIS — D509 Iron deficiency anemia, unspecified: Secondary | ICD-10-CM | POA: Insufficient documentation

## 2019-11-21 DIAGNOSIS — I130 Hypertensive heart and chronic kidney disease with heart failure and stage 1 through stage 4 chronic kidney disease, or unspecified chronic kidney disease: Secondary | ICD-10-CM | POA: Insufficient documentation

## 2019-11-21 DIAGNOSIS — D508 Other iron deficiency anemias: Secondary | ICD-10-CM

## 2019-11-21 DIAGNOSIS — N183 Chronic kidney disease, stage 3 unspecified: Secondary | ICD-10-CM | POA: Insufficient documentation

## 2019-11-21 LAB — CBC WITH DIFFERENTIAL/PLATELET
Abs Immature Granulocytes: 0.08 10*3/uL — ABNORMAL HIGH (ref 0.00–0.07)
Basophils Absolute: 0.1 10*3/uL (ref 0.0–0.1)
Basophils Relative: 1 %
Eosinophils Absolute: 0.3 10*3/uL (ref 0.0–0.5)
Eosinophils Relative: 3 %
HCT: 25.2 % — ABNORMAL LOW (ref 39.0–52.0)
Hemoglobin: 7.1 g/dL — ABNORMAL LOW (ref 13.0–17.0)
Immature Granulocytes: 1 %
Lymphocytes Relative: 7 %
Lymphs Abs: 0.8 10*3/uL (ref 0.7–4.0)
MCH: 25.5 pg — ABNORMAL LOW (ref 26.0–34.0)
MCHC: 28.2 g/dL — ABNORMAL LOW (ref 30.0–36.0)
MCV: 90.6 fL (ref 80.0–100.0)
Monocytes Absolute: 1.1 10*3/uL — ABNORMAL HIGH (ref 0.1–1.0)
Monocytes Relative: 9 %
Neutro Abs: 9.7 10*3/uL — ABNORMAL HIGH (ref 1.7–7.7)
Neutrophils Relative %: 79 %
Platelets: 311 10*3/uL (ref 150–400)
RBC: 2.78 MIL/uL — ABNORMAL LOW (ref 4.22–5.81)
RDW: 15.5 % (ref 11.5–15.5)
WBC: 12.2 10*3/uL — ABNORMAL HIGH (ref 4.0–10.5)
nRBC: 0 % (ref 0.0–0.2)

## 2019-11-21 LAB — COMPREHENSIVE METABOLIC PANEL
ALT: 6 U/L (ref 0–44)
AST: 9 U/L — ABNORMAL LOW (ref 15–41)
Albumin: 3.1 g/dL — ABNORMAL LOW (ref 3.5–5.0)
Alkaline Phosphatase: 104 U/L (ref 38–126)
Anion gap: 7 (ref 5–15)
BUN: 48 mg/dL — ABNORMAL HIGH (ref 8–23)
CO2: 23 mmol/L (ref 22–32)
Calcium: 8.9 mg/dL (ref 8.9–10.3)
Chloride: 106 mmol/L (ref 98–111)
Creatinine, Ser: 2.33 mg/dL — ABNORMAL HIGH (ref 0.61–1.24)
GFR calc Af Amer: 28 mL/min — ABNORMAL LOW (ref >60)
GFR calc non Af Amer: 25 mL/min — ABNORMAL LOW (ref >60)
Glucose, Bld: 178 mg/dL — ABNORMAL HIGH (ref 70–99)
Potassium: 4.7 mmol/L (ref 3.5–5.1)
Sodium: 136 mmol/L (ref 135–145)
Total Bilirubin: 1.1 mg/dL (ref 0.3–1.2)
Total Protein: 7.2 g/dL (ref 6.5–8.1)

## 2019-11-21 LAB — PREPARE RBC (CROSSMATCH)

## 2019-11-21 LAB — POCT HEMOGLOBIN-HEMACUE: Hemoglobin: 6.9 g/dL — CL (ref 13.0–17.0)

## 2019-11-21 MED ORDER — SODIUM CHLORIDE 0.9 % IV SOLN
10.0000 mL/h | Freq: Once | INTRAVENOUS | Status: AC
  Administered 2019-11-21: 10 mL/h via INTRAVENOUS

## 2019-11-21 MED ORDER — EPOETIN ALFA 3000 UNIT/ML IJ SOLN
3000.0000 [IU] | Freq: Once | INTRAMUSCULAR | Status: AC
  Administered 2019-11-21: 15:00:00 3000 [IU] via SUBCUTANEOUS
  Filled 2019-11-21: qty 1

## 2019-11-21 MED ORDER — SODIUM CHLORIDE 0.9 % IV SOLN: Freq: Once | INTRAVENOUS | Status: AC

## 2019-11-21 MED ORDER — SODIUM CHLORIDE 0.9 % IV SOLN
750.0000 mg | Freq: Once | INTRAVENOUS | Status: AC
  Administered 2019-11-21: 750 mg via INTRAVENOUS
  Filled 2019-11-21: qty 15

## 2019-11-21 NOTE — ED Notes (Signed)
Call [name] when Pt is ready for discharge.

## 2019-11-21 NOTE — Progress Notes (Signed)
Hgb 6.9.  Epogen and injectafer will be given as ordered.  Per Dr. Theador Hawthorne patient is to go to ER for transfusion of 2 units of blood and has communicated with ED physician to make arrangements.  Report called to [place].  Sister aware and is with patient.

## 2019-11-21 NOTE — ED Triage Notes (Signed)
Pt received an iron infusion today and is from day surgery. He has a hg. of 6.9. Pt needs 2 units of blood, per Dr. Marylene Buerger. Pt usually takes Lasix, but hasn't taken it today.

## 2019-11-21 NOTE — Discharge Instructions (Addendum)
Follow up with your md next week.  Return if needed

## 2019-11-21 NOTE — ED Notes (Signed)
Pts contact has been notified and will come provide transportation home for Pt following discharge.

## 2019-11-22 LAB — TYPE AND SCREEN
ABO/RH(D): A NEG
Antibody Screen: NEGATIVE
Unit division: 0
Unit division: 0

## 2019-11-22 LAB — BPAM RBC
Blood Product Expiration Date: 202104102359
Blood Product Expiration Date: 202104102359
ISSUE DATE / TIME: 202104011821
ISSUE DATE / TIME: 202104012105
Unit Type and Rh: 600
Unit Type and Rh: 600

## 2019-11-22 NOTE — ED Provider Notes (Signed)
[facility] EMERGENCY DEPARTMENT Provider Note   CSN: [id number] Arrival date & time: 11/21/19  1455     History Chief Complaint  Patient presents with  . Blood Transfusion    Zachary Mcmahon is a 86 y.o. male.  Patient has history of kidney disease.  He was seen by his nephrologist recently and the nephrologist wanted the patient to get 2 units of packed red blood cells for his anemia.  He states that he usually sends the patient over to the outpatient treatment area but they did not have nurses available so he sent him to the emergency department to get blood.  The history is provided by the patient. No language interpreter was used.  Weakness Severity:  Mild Onset quality:  Sudden Timing:  Constant Progression:  Waxing and waning Chronicity:  Recurrent Context: not alcohol use   Relieved by:  Nothing Worsened by:  Nothing Ineffective treatments:  None tried Associated symptoms: abdominal pain   Associated symptoms: no chest pain, no cough, no diarrhea, no frequency, no headaches and no seizures        Past Medical History:  Diagnosis Date  . Arthritis    "legs" (02/07/2015)  . Basal cell carcinoma of forehead 2016 X 2  . Basal cell carcinoma of left earlobe   . CHF (congestive heart failure) ([place])   . Diabetic neuropathy ([place]) 09/04/2017  . Diabetic peripheral neuropathy ([place])    "left foot" (02/07/2015)  . 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 GERD (gastroesophageal reflux disease)   . High potassium   . History of gout X 1  . Hyperlipidemia   . Hypertension   . Old myocardial infarct    "sometime in the past; don't know when" (02/07/2015)  . Pain and swelling of left lower leg    chronic  . Pneumonia X 1  . Renal insufficiency   . Respiratory failure ([employer])   . Type II diabetes mellitus ([place])   . Varicose veins     Patient Active Problem List   Diagnosis Date Noted  . Diabetic neuropathy ([place]) 09/04/2017  . Clostridium difficile  infection 08/09/2016  . Acute on chronic combined systolic and diastolic CHF (congestive heart failure) ([place]) 10/03/2015  . Chronic anticoagulation 10/03/2015  . CKD (chronic kidney disease), stage III 10/03/2015  . Chronic kidney disease (CKD), stage IV (severe) ([place]) 03/05/2015  . Acute pulmonary embolism ([place]) 03/04/2015  . Pulmonary embolism ([place]) 03/03/2015  . 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 Pressure ulcer 02/14/2015  . CHF (congestive heart failure) ([place])   . Renal insufficiency   . Acute respiratory failure with hypoxia ([place]) 02/12/2015  . Arterial hypotension   . Pulmonary edema   . Acute on chronic combined systolic and diastolic HF (heart failure) ([place])   . Abdominal distension   . Acute renal failure superimposed on stage 3 chronic kidney disease ([place])   . NSTEMI (non-ST elevated myocardial infarction) ([place])   . Non-STEMI (non-ST elevated myocardial infarction) ([place]) 02/07/2015  . Unstable angina ([place]) 02/06/2015  . CKD (chronic kidney disease) stage 3, GFR 30-59 ml/min 02/06/2015  . Chest pain 02/06/2015  . Abdominal pain, acute, generalized 02/06/2015  . Diarrhea 02/06/2015  . Swelling of extremity, left, chronic 02/06/2015  . Ileus ([place]) 02/06/2015  . Hyperlipidemia 04/23/2014  . Coronary artery disease 04/23/2014  . Varicose veins of lower extremities with other complications 38/46/6599  . Unspecified hemorrhoids with other complication 35/70/1779  . Intertrochanteric fracture of right hip ([place])  03/20/2012  . HTN (hypertension), malignant 03/20/2012  . DM type 2 (diabetes mellitus, type 2) ([place]) 03/20/2012  . Acute renal failure ([place]) 03/20/2012  . Thrombocytopenia ([place]) 03/20/2012  . Hyperkalemia 03/20/2012    Past Surgical History:  Procedure Laterality Date  . BASAL CELL CARCINOMA EXCISION     "probably 1/2 dozen cut off face, left ear" (02/07/2015)  . CARDIAC CATHETERIZATION N/A 02/09/2015   Procedure: Left Heart Cath and Coronary Angiography;  Surgeon: Leonie Man, MD;  Location: [facility];  Service: Cardiovascular;  Laterality: N/A;  . CARDIOVASCULAR STRESS TEST  05/29/2012   Mild-moderate perfusion defect due to infarct/scar with mild-moderate perinfarct ischemia seen in the mid anterior, apical anterior, apical septal, and apical regions. No ECG changes. Global LV systolic function is severely reduced.  Marland Kitchen CATARACT EXTRACTION W/PHACO Left 11/12/2015   Procedure: CATARACT EXTRACTION PHACO AND INTRAOCULAR LENS PLACEMENT LEFT EYE;  Surgeon: Tonny Branch, MD;  Location: [facility];  Service: Ophthalmology;  Laterality: Left;  CDE: 9.82  . CATARACT EXTRACTION W/PHACO Right 12/14/2015   Procedure: CATARACT EXTRACTION PHACO AND INTRAOCULAR LENS PLACEMENT (IOC);  Surgeon: Tonny Branch, MD;  Location: [facility];  Service: Ophthalmology;  Laterality: Right;  CDE: 13.86  . CORONARY ARTERY BYPASS GRAFT N/A 02/17/2015   Procedure: CORONARY ARTERY BYPASS GRAFTING (CABG), ON PUMP, TIMES FIVE, USING LEFT INTERNAL MAMMARY ARTERY, RIGHT GREATER SAPHENOUS VEIN HARVESTED ENDOSCOPICALLY;  Surgeon: [name], MD;  Location: [facility];  Service: Open Heart Surgery;  Laterality: N/A;  -LIMA to LAD -SVG to DIAGONAL - SEQ SVG to OM1 and PLB -SVG to PDA  . CYSTOSCOPY W/ STONE MANIPULATION  X 1  . FRACTURE SURGERY    . KNEE ARTHROSCOPY Right ~ 2008  . LEXISCAM MYOCARDIAL PERFUSION  05/29/12   MARKED PERFUSION DEFECT DUE TO INFARC/SCAR WITH MILD PERINFARCT ISCHEMIA IN THE BASAL INFERIOR, MID INFEROSEPTAL, MID INFERIOR AND APICALINFERIOR REGION. EF%33%. PERIINFARCT ISCHEMIA IN THE MID ANTERIOR, APICAL ANTERIOR, APICAL SEPTAL AND APICAL REGIONS.  . LOWER EXTREMITY VENOUS DOPPLER  07/11/2012   No evidence of DVT in the left lower extremity. Evidence of partially recanalized, chronic, non-obstructive thrombus in the left great SV and its branches consistent with significant reflux consistent with post-phlebitic syndrome. Significant reflux of the left short saphenous vein.  Marland Kitchen open  heart sx  02/17/15  . ORIF HIP FRACTURE  03/20/2012   Procedure: OPEN REDUCTION INTERNAL FIXATION HIP;  Surgeon: Sanjuana Kava, MD;  Location: [facility];  Service: Orthopedics;  Laterality: Right;  . TEE WITHOUT CARDIOVERSION N/A 02/17/2015   Procedure: TRANSESOPHAGEAL ECHOCARDIOGRAM (TEE);  Surgeon: [name], MD;  Location: [facility];  Service: Open Heart Surgery;  Laterality: N/A;  . TRANSTHORACIC ECHOCARDIOGRAM  04/18/2012   EF 45%, mild-moderate LVH  . TRANSTHORACIC ECHOCARDIOGRAM  04/18/12   EF% 45%.SEVERE HYPOKINESIS TO AKINESIS OF THE MID-DISTAL INFEROLATERAL MYOCARDIUM AND MUCH OF THE APEX.       Family History  Problem Relation Age of Onset  . Deep vein thrombosis Mother   . Hypertension Mother   . Hypertension Sister   . Diabetes Brother   . Hyperlipidemia Brother   . Hypertension Brother   . Heart attack Brother   . Colon cancer Neg Hx     Social History   Tobacco Use  . Smoking status: Never Smoker  . Smokeless tobacco: Never Used  Substance Use Topics  . Alcohol use: No    Alcohol/week: 0.0 standard drinks  . Drug use: No  Home Medications Prior to Admission medications   Medication Sig Start Date End Date Taking? Authorizing Provider  acetaminophen (TYLENOL) 500 MG tablet Take 500 mg by mouth every 6 (six) hours as needed for mild pain.   Yes [provider]  amLODipine (NORVASC) 5 MG tablet TAKE 1 TABLET BY MOUTH EVERY DAY 10/15/19  Yes Herminio Commons, MD  atorvastatin (LIPITOR) 20 MG tablet Take 0.5 tablets by mouth daily.  12/22/16  Yes [provider]  carvedilol (COREG) 6.25 MG tablet TAKE 1 TABLET BY MOUTH TWICE DAILY 10/15/19  Yes Herminio Commons, MD  cholecalciferol (VITAMIN D) 1000 units tablet Take 1,000 Units by mouth daily.   Yes [provider]  ferrous sulfate 325 (65 FE) MG tablet Take 1 tablet (325 mg total) by mouth daily with breakfast. 02/24/15  Yes Lars Pinks M, PA-C  furosemide (LASIX) 20 MG tablet  Take 20 mg by mouth daily.   Yes [provider]  losartan (COZAAR) 25 MG tablet Take 25 mg by mouth daily. 12/22/16  Yes [provider]  omeprazole (PRILOSEC) 20 MG capsule Take 20 mg by mouth daily. 12/23/16  Yes [provider]  [place] extra strength probiotic daily   Yes [provider]  traMADol (ULTRAM) 50 MG tablet Take 50 mg by mouth every 6 (six) hours as needed.   Yes [provider]    Allergies    Eliquis [apixaban], Bactrim [sulfamethoxazole-trimethoprim], and Hydralazine  Review of Systems   Review of Systems  Constitutional: Negative for appetite change and fatigue.  HENT: Negative for congestion, ear discharge and sinus pressure.   Eyes: Negative for discharge.  Respiratory: Negative for cough.   Cardiovascular: Negative for chest pain.  Gastrointestinal: Positive for abdominal pain. Negative for diarrhea.  Genitourinary: Negative for frequency and hematuria.  Musculoskeletal: Negative for back pain.  Skin: Negative for rash.  Neurological: Positive for weakness. Negative for seizures and headaches.  Psychiatric/Behavioral: Negative for hallucinations.    Physical Exam Updated Vital Signs BP (!) 154/73   Pulse 74   Temp 98.3 F (36.8 C) (Oral)   Resp 16   Ht 5\' 9"  (1.753 m)   Wt 94.8 kg   SpO2 96%   BMI 30.86 kg/m   Physical Exam Vitals and nursing note reviewed.  Constitutional:      Appearance: He is well-developed.  HENT:     Head: Normocephalic.     Nose: Nose normal.  Eyes:     General: No scleral icterus.    Conjunctiva/sclera: Conjunctivae normal.  Neck:     Thyroid: No thyromegaly.  Cardiovascular:     Rate and Rhythm: Normal rate and regular rhythm.     Heart sounds: No murmur. No friction rub. No gallop.   Pulmonary:     Breath sounds: No stridor. No wheezing or rales.  Chest:     Chest wall: No tenderness.  Abdominal:     General: There is no distension.      Tenderness: There is no abdominal tenderness. There is no rebound.  Musculoskeletal:        General: Normal range of motion.     Cervical back: Neck supple.  Lymphadenopathy:     Cervical: No cervical adenopathy.  Skin:    Findings: No erythema or rash.  Neurological:     Mental Status: He is alert and oriented to person, place, and time.     Motor: No abnormal muscle tone.  Coordination: Coordination normal.  Psychiatric:        Behavior: Behavior normal.     ED Results / Procedures / Treatments   Labs (all labs ordered are listed, but only abnormal results are displayed) Labs Reviewed  CBC WITH DIFFERENTIAL/PLATELET - Abnormal; Notable for the following components:      Result Value   WBC 12.2 (*)    RBC 2.78 (*)    Hemoglobin 7.1 (*)    HCT 25.2 (*)    MCH 25.5 (*)    MCHC 28.2 (*)    Neutro Abs 9.7 (*)    Monocytes Absolute 1.1 (*)    Abs Immature Granulocytes 0.08 (*)    All other components within normal limits  COMPREHENSIVE METABOLIC PANEL - Abnormal; Notable for the following components:   Glucose, Bld 178 (*)    BUN 48 (*)    Creatinine, Ser 2.33 (*)    Albumin 3.1 (*)    AST 9 (*)    GFR calc non Af Amer 25 (*)    GFR calc Af Amer 28 (*)    All other components within normal limits  TYPE AND SCREEN  PREPARE RBC (CROSSMATCH)    EKG None  Radiology No results found.  Procedures Procedures (including critical care time)  Medications Ordered in ED Medications  0.9 %  sodium chloride infusion (0 mL/hr Intravenous Stopped 11/21/19 2125)    ED Course  I have reviewed the triage vital signs and the nursing notes.  Pertinent labs & imaging results that were available during my care of the patient were reviewed by me and considered in my medical decision making (see chart for details).    CRITICAL CARE Performed by: Milton Ferguson Total critical care time: 40 minutes Critical care time was exclusive of separately billable procedures and treating  other patients. Critical care was necessary to treat or prevent imminent or life-threatening deterioration. Critical care was time spent personally by me on the following activities: development of treatment plan with patient and/or surrogate as well as nursing, discussions with consultants, evaluation of patient's response to treatment, examination of patient, obtaining history from patient or surrogate, ordering and performing treatments and interventions, ordering and review of laboratory studies, ordering and review of radiographic studies, pulse oximetry and re-evaluation of patient's condition.  MDM Rules/Calculators/A&P                      Patient with symptomatic anemia from his renal disease.  Patient was transfused 2 units packed red blood cells under the instruction of his nephrologist.  The patient did well and was discharged home Final Clinical Impression(s) / ED Diagnoses Final diagnoses:  Other iron deficiency anemia    Rx / DC Orders ED Discharge Orders    None       Milton Ferguson, MD 11/22/19 1126

## 2019-11-25 ENCOUNTER — Ambulatory Visit: Payer: Medicare Other | Admitting: Family Medicine

## 2019-11-29 ENCOUNTER — Other Ambulatory Visit: Payer: Self-pay

## 2019-11-29 ENCOUNTER — Encounter (HOSPITAL_COMMUNITY): Payer: Self-pay

## 2019-11-29 ENCOUNTER — Encounter (HOSPITAL_COMMUNITY)
Admission: RE | Admit: 2019-11-29 | Discharge: 2019-11-29 | Disposition: A | Discharge status: Home / Self Care | Payer: Medicare Other | Source: Ambulatory Visit | Attending: Internal Medicine | Admitting: Internal Medicine

## 2019-11-29 DIAGNOSIS — D509 Iron deficiency anemia, unspecified: Secondary | ICD-10-CM | POA: Diagnosis not present

## 2019-11-29 MED ORDER — SODIUM CHLORIDE 0.9 % IV SOLN: Freq: Once | INTRAVENOUS | Status: AC

## 2019-11-29 MED ORDER — SODIUM CHLORIDE 0.9 % IV SOLN
750.0000 mg | Freq: Once | INTRAVENOUS | Status: AC
  Administered 2019-11-29: 14:00:00 750 mg via INTRAVENOUS
  Filled 2019-11-29: qty 15

## 2019-12-01 NOTE — Progress Notes (Signed)
Cardiology Office Note  Date: 12/02/2019   ID: Zachary Mcmahon, DOB 11-02-1931, MRN [id number]  PCP:  Celene Squibb, MD  Cardiologist:  Kate Sable, MD Electrophysiologist:  None   Chief Complaint: Follow-up CAD/CHF, DM 2, CKD 4, anemia  History of Present Illness: Zachary Mcmahon is a 86 y.o. male with a history of CAD status post CABG x 5 2016, pulmonary embolism, DM 2, CKD stage IV, anemia (recent  IV iron / PRBC's given), chronic lower extremity edema, HTN, HLD.[name] Dr. Bronson Ing February 21, 2019 via telemedicine.  At that visit he had no complaints of chest pain, shortness of breath, palpitations.  His chronic lower extremity swelling was stable.  He was wearing compression stockings.  He complained of bilateral foot neuropathy.  He was not on aspirin due to recurrent nosebleeds.  He had a Lexiscan Cardiolite stress test indicative of ischemia as a cause for EF reduction.  Patient did not want to proceed with coronary angiography.  Patient sees Dr. Theador Hawthorne for stage IV renal disease.  He recently had an iron infusion and subsequent packed red blood cell transfusions for significant anemia Hgb 6.9.  This occurred on April 1 and 2, 2021.  Past Medical History:  Diagnosis Date  . Arthritis    "legs" (02/07/2015)  . Basal cell carcinoma of forehead 2016 X 2  . Basal cell carcinoma of left earlobe   . CHF (congestive heart failure) ([place])   . Diabetic neuropathy ([place]) 09/04/2017  . Diabetic peripheral neuropathy ([place])    "left foot" (02/07/2015)  . 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 GERD (gastroesophageal reflux disease)   . High potassium   . History of gout X 1  . Hyperlipidemia   . Hypertension   . Old myocardial infarct    "sometime in the past; don't know when" (02/07/2015)  . Pain and swelling of left lower leg    chronic  . Pneumonia X 1  . Renal insufficiency   . Respiratory failure ([place])   . Type II diabetes mellitus ([place])   . Varicose veins      Past Surgical History:  Procedure Laterality Date  . BASAL CELL CARCINOMA EXCISION     "probably 1/2 dozen cut off face, left ear" (02/07/2015)  . CARDIAC CATHETERIZATION N/A 02/09/2015   Procedure: Left Heart Cath and Coronary Angiography;  Surgeon: Leonie Man, MD;  Location: [facility];  Service: Cardiovascular;  Laterality: N/A;  . CARDIOVASCULAR STRESS TEST  05/29/2012   Mild-moderate perfusion defect due to infarct/scar with mild-moderate perinfarct ischemia seen in the mid anterior, apical anterior, apical septal, and apical regions. No ECG changes. Global LV systolic function is severely reduced.  Marland Kitchen CATARACT EXTRACTION W/PHACO Left 11/12/2015   Procedure: CATARACT EXTRACTION PHACO AND INTRAOCULAR LENS PLACEMENT LEFT EYE;  Surgeon: Tonny Branch, MD;  Location: [facility];  Service: Ophthalmology;  Laterality: Left;  CDE: 9.82  . CATARACT EXTRACTION W/PHACO Right 12/14/2015   Procedure: CATARACT EXTRACTION PHACO AND INTRAOCULAR LENS PLACEMENT (IOC);  Surgeon: Tonny Branch, MD;  Location: [facility];  Service: Ophthalmology;  Laterality: Right;  CDE: 13.86  . CORONARY ARTERY BYPASS GRAFT N/A 02/17/2015   Procedure: CORONARY ARTERY BYPASS GRAFTING (CABG), ON PUMP, TIMES FIVE, USING LEFT INTERNAL MAMMARY ARTERY, RIGHT GREATER SAPHENOUS VEIN HARVESTED ENDOSCOPICALLY;  Surgeon: [name], MD;  Location: [facility];  Service: Open Heart Surgery;  Laterality: N/A;  -LIMA to LAD -SVG to DIAGONAL - SEQ SVG  to OM1 and PLB -SVG to PDA  . CYSTOSCOPY W/ STONE MANIPULATION  X 1  . FRACTURE SURGERY    . KNEE ARTHROSCOPY Right ~ 2008  . LEXISCAM MYOCARDIAL PERFUSION  05/29/12   MARKED PERFUSION DEFECT DUE TO INFARC/SCAR WITH MILD PERINFARCT ISCHEMIA IN THE BASAL INFERIOR, MID INFEROSEPTAL, MID INFERIOR AND APICALINFERIOR REGION. EF%33%. PERIINFARCT ISCHEMIA IN THE MID ANTERIOR, APICAL ANTERIOR, APICAL SEPTAL AND APICAL REGIONS.  . LOWER EXTREMITY VENOUS DOPPLER  07/11/2012   No evidence of DVT in the  left lower extremity. Evidence of partially recanalized, chronic, non-obstructive thrombus in the left great SV and its branches consistent with significant reflux consistent with post-phlebitic syndrome. Significant reflux of the left short saphenous vein.  Marland Kitchen open heart sx  02/17/15  . ORIF HIP FRACTURE  03/20/2012   Procedure: OPEN REDUCTION INTERNAL FIXATION HIP;  Surgeon: Sanjuana Kava, MD;  Location: [facility];  Service: Orthopedics;  Laterality: Right;  . TEE WITHOUT CARDIOVERSION N/A 02/17/2015   Procedure: TRANSESOPHAGEAL ECHOCARDIOGRAM (TEE);  Surgeon: [name], MD;  Location: [facility];  Service: Open Heart Surgery;  Laterality: N/A;  . TRANSTHORACIC ECHOCARDIOGRAM  04/18/2012   EF 45%, mild-moderate LVH  . TRANSTHORACIC ECHOCARDIOGRAM  04/18/12   EF% 45%.SEVERE HYPOKINESIS TO AKINESIS OF THE MID-DISTAL INFEROLATERAL MYOCARDIUM AND MUCH OF THE APEX.    Current Outpatient Medications  Medication Sig Dispense Refill  . acetaminophen (TYLENOL) 500 MG tablet Take 500 mg by mouth every 6 (six) hours as needed for mild pain.    Marland Kitchen amLODipine (NORVASC) 5 MG tablet TAKE 1 TABLET BY MOUTH EVERY DAY 90 tablet 0  . atorvastatin (LIPITOR) 20 MG tablet Take 0.5 tablets by mouth daily.     . carvedilol (COREG) 6.25 MG tablet TAKE 1 TABLET BY MOUTH TWICE DAILY 180 tablet 0  . cholecalciferol (VITAMIN D) 1000 units tablet Take 1,000 Units by mouth daily.    . ferrous sulfate 325 (65 FE) MG tablet Take 1 tablet (325 mg total) by mouth daily with breakfast.  3  . furosemide (LASIX) 20 MG tablet Take 20 mg by mouth daily.    Marland Kitchen losartan (COZAAR) 25 MG tablet Take 25 mg by mouth daily.    Marland Kitchen omeprazole (PRILOSEC) 20 MG capsule Take 20 mg by mouth daily.    Marland Kitchen OVER THE [place] extra strength probiotic daily    . traMADol (ULTRAM) 50 MG tablet Take 50 mg by mouth every 6 (six) hours as needed.     No current facility-administered medications for this visit.   Allergies:  Eliquis  [apixaban], Bactrim [sulfamethoxazole-trimethoprim], and Hydralazine   Social History: The patient  reports that he has never smoked. He has never used smokeless tobacco. He reports that he does not drink alcohol or use drugs.   Family History: The patient's family history includes Deep vein thrombosis in his mother; Diabetes in his brother; Heart attack in his brother; Hyperlipidemia in his brother; Hypertension in his brother, mother, and sister.   ROS:  Please see the history of present illness. Otherwise, complete review of systems is positive for none.  All other systems are reviewed and negative.   Physical Exam: VS:  BP (!) 166/72   Pulse 70   Ht 5\' 9"  (1.753 m)   Wt 217 lb 6.4 oz (98.6 kg)   SpO2 90%   BMI 32.10 kg/m , BMI Body mass index is 32.1 kg/m.  Wt Readings from Last 3 Encounters:  12/02/19 217 lb 6.4 oz (  98.6 kg)  11/21/19 209 lb (94.8 kg)  11/21/19 210 lb (95.3 kg)    General: Patient appears comfortable at rest. Neck: Supple, no elevated JVP or carotid bruits, no thyromegaly. Lungs: Clear to auscultation, nonlabored breathing at rest. Cardiac: Regular rate and rhythm, no S3 or significant systolic murmur, no pericardial rub. Extremities: No pitting edema, distal pulses 2+. Skin: Warm and dry. Musculoskeletal: No kyphosis. Neuropsychiatric: Alert and oriented x3, affect grossly appropriate.  ECG:  An ECG dated 12/02/2019 was personally reviewed today and demonstrated:  Sinus rhythm first-degree AV block occasional ectopic ventricular beat rate of 70, old anterior infarct, diffuse nonspecific T wave abnormality  Recent Labwork: 11/21/2019: ALT 6; AST 9; BUN 48; Creatinine, Ser 2.33; Hemoglobin 7.1; Platelets 311; Potassium 4.7; Sodium 136     Component Value Date/Time   CHOL 83 02/07/2015 0533   TRIG 91 02/07/2015 0533   HDL 17 (L) 02/07/2015 0533   CHOLHDL 4.9 02/07/2015 0533   VLDL 18 02/07/2015 0533   LDLCALC 48 02/07/2015 0533    Other Studies Reviewed  Today:  Echocardiogram 12/18/2017 Mild left ventricular enlargement and hypertrophy with inferior/posterior akinesis with severely decreased contraction overall estimated.  Ejection fraction  25 to 30%; moderately dilated left atrium, moderate pulmonary hypertension, moderately dilated right ventricle with moderately decreased contraction; mildly dilated right atrium, mild aortic stenosis, annular calcification and thickened mitral valve leaflets with moderate mitral regurgitation, moderate tricuspid regurgitation, atrial septal aneurysm   Assessment and Plan:  1. CAD in native artery   2. Chronic combined systolic and diastolic heart failure ([place])   3. Essential hypertension   4. Hyperlipidemia LDL goal <70   5. Chronic kidney disease (CKD), stage IV (severe) ([place])    1. CAD in native artery Status post CABG times  X 5 2016 ; patient denies any recent anginal or exertional symptoms.  He is not very active on a daily basis due to multiple medical problems including stage IV renal failure, anemia.  Continue Coreg 6.25 mg p.o. twice daily.   2. Chronic combined systolic and diastolic heart failure (HCC) Last echocardiogram showed EF of 25 to 30%.  Continue Coreg 6.25 p.o. twice daily, Lasix 20 mg daily,  3. Essential hypertension Blood pressure was elevated on arrival.  Patient states she had a hard time ambulating from the parking lot to clinic and this may have increased his blood pressure.  Recheck in left arm blood pressure was 140/78.  Continue amlodipine 5 mg, losartan 25 mg daily.  4. Hyperlipidemia LDL goal <70 Last lipid panel from PCP office February 2020 showed total cholesterol 77, triglycerides 49, HDL 22, LDL 45.  Continue atorvastatin 20 mg daily  5. Chronic kidney disease (CKD), stage IV (severe) ([place]) Sees nephrology Dr. Theador Hawthorne.  11/21/2019 creatinine 2.33, GFR 25.  Recently had iron infusion and packed red blood cell transfusions for significant anemia.  Also states she  recently had to "iron shots".  Follow-up with nephrology  Medication Adjustments/Labs and Tests Ordered: Current medicines are reviewed at length with the patient today.  Concerns regarding medicines are outlined above.   Disposition: Follow-up with Dr. Bronson Ing or APP 6 months  Signed, Levell July, NP 12/02/2019 11:18 AM    [facility], [place], [place] [address] Phone: [phone]; Fax: [phone]

## 2019-12-02 ENCOUNTER — Ambulatory Visit (INDEPENDENT_AMBULATORY_CARE_PROVIDER_SITE_OTHER): Payer: Medicare Other | Admitting: Family Medicine

## 2019-12-02 ENCOUNTER — Other Ambulatory Visit: Payer: Self-pay

## 2019-12-02 ENCOUNTER — Encounter: Payer: Self-pay | Admitting: Family Medicine

## 2019-12-02 VITALS — BP 166/72 | HR 70 | Ht 69.0 in | Wt 217.4 lb

## 2019-12-02 DIAGNOSIS — N184 Chronic kidney disease, stage 4 (severe): Secondary | ICD-10-CM | POA: Diagnosis not present

## 2019-12-02 DIAGNOSIS — I10 Essential (primary) hypertension: Secondary | ICD-10-CM

## 2019-12-02 DIAGNOSIS — I5042 Chronic combined systolic (congestive) and diastolic (congestive) heart failure: Secondary | ICD-10-CM | POA: Diagnosis not present

## 2019-12-02 DIAGNOSIS — E785 Hyperlipidemia, unspecified: Secondary | ICD-10-CM | POA: Diagnosis not present

## 2019-12-02 DIAGNOSIS — I2510 Atherosclerotic heart disease of native coronary artery without angina pectoris: Secondary | ICD-10-CM | POA: Diagnosis not present

## 2019-12-02 NOTE — Patient Instructions (Signed)
Medication Instructions:   Your physician recommends that you continue on your current medications as directed. Please refer to the Current Medication list given to you today.  Labwork:  NONE  Testing/Procedures:  NONE  Follow-Up:  Your physician recommends that you schedule a follow-up appointment in: 6 months (office or virtual). You will receive a reminder letter in the mail in about 4 months reminding you to call and schedule your appointment. If you don't receive this letter, please contact our office.  Any Other Special Instructions Will Be Listed Below (If Applicable).  If you need a refill on your cardiac medications before your next appointment, please call your pharmacy.

## 2019-12-05 ENCOUNTER — Encounter (HOSPITAL_COMMUNITY): Payer: Medicare Other

## 2019-12-05 ENCOUNTER — Other Ambulatory Visit (HOSPITAL_COMMUNITY): Payer: Medicare Other

## 2019-12-05 ENCOUNTER — Ambulatory Visit (HOSPITAL_COMMUNITY): Payer: Medicare Other

## 2019-12-06 ENCOUNTER — Encounter (HOSPITAL_COMMUNITY): Payer: Self-pay

## 2019-12-06 ENCOUNTER — Other Ambulatory Visit: Payer: Self-pay

## 2019-12-06 ENCOUNTER — Encounter (HOSPITAL_COMMUNITY)
Admission: RE | Admit: 2019-12-06 | Discharge: 2019-12-06 | Disposition: A | Discharge status: Home / Self Care | Payer: Medicare Other | Source: Ambulatory Visit | Attending: Nephrology | Admitting: Nephrology

## 2019-12-06 ENCOUNTER — Encounter (HOSPITAL_COMMUNITY)
Admission: RE | Admit: 2019-12-06 | Discharge: 2019-12-06 | Disposition: A | Discharge status: Home / Self Care | Payer: Medicare Other | Source: Ambulatory Visit | Attending: Internal Medicine | Admitting: Internal Medicine

## 2019-12-06 DIAGNOSIS — N184 Chronic kidney disease, stage 4 (severe): Secondary | ICD-10-CM | POA: Insufficient documentation

## 2019-12-06 DIAGNOSIS — D509 Iron deficiency anemia, unspecified: Secondary | ICD-10-CM | POA: Diagnosis not present

## 2019-12-06 DIAGNOSIS — D631 Anemia in chronic kidney disease: Secondary | ICD-10-CM | POA: Diagnosis not present

## 2019-12-06 LAB — POCT HEMOGLOBIN-HEMACUE: Hemoglobin: 9.5 g/dL — ABNORMAL LOW (ref 13.0–17.0)

## 2019-12-06 MED ORDER — SODIUM CHLORIDE 0.9 % IV SOLN
750.0000 mg | Freq: Once | INTRAVENOUS | Status: AC
  Administered 2019-12-06: 750 mg via INTRAVENOUS
  Filled 2019-12-06: qty 15

## 2019-12-06 MED ORDER — EPOETIN ALFA 3000 UNIT/ML IJ SOLN
INTRAMUSCULAR | Status: AC
  Filled 2019-12-06: qty 1

## 2019-12-06 MED ORDER — SODIUM CHLORIDE 0.9 % IV SOLN
Freq: Once | INTRAVENOUS | Status: AC
  Administered 2019-12-06: 14:00:00 250 mL via INTRAVENOUS

## 2019-12-06 MED ORDER — EPOETIN ALFA 3000 UNIT/ML IJ SOLN
3000.0000 [IU] | Freq: Once | INTRAMUSCULAR | Status: AC
  Administered 2019-12-06: 3000 [IU] via SUBCUTANEOUS

## 2019-12-06 NOTE — Progress Notes (Signed)
Results for Zachary Mcmahon, Zachary Mcmahon (MRN [id number]) as of 12/06/2019 14:13  Received 3000 units of epogen given as indicated. Patient also received injectafer 750 mg which as been ordered by Dr. Wende Neighbors. Tolerated infusion well. Next appointment 12/23/2019 @ 1330.    Ref. Range 12/06/2019 13:49  Hemoglobin Latest Ref Range: 13.0 - 17.0 g/dL 9.5 (L)

## 2019-12-06 NOTE — Discharge Instructions (Signed)
Epoetin Alfa injection What is this medicine? EPOETIN ALFA (e POE e tin AL fa) helps your body make more red blood cells. This medicine is used to treat anemia caused by chronic kidney disease, cancer chemotherapy, or HIV-therapy. It may also be used before surgery if you have anemia. This medicine may be used for other purposes; ask your health care provider or pharmacist if you have questions. COMMON BRAND NAME(S): Epogen, Procrit, Retacrit What should I tell my health care provider before I take this medicine? They need to know if you have any of these conditions:  cancer  heart disease  high blood pressure  history of blood clots  history of stroke  low levels of folate, iron, or vitamin B12 in the blood  seizures  an unusual or allergic reaction to erythropoietin, albumin, benzyl alcohol, hamster proteins, other medicines, foods, dyes, or preservatives  pregnant or trying to get pregnant  breast-feeding How should I use this medicine? This medicine is for injection into a vein or under the skin. It is usually given by a health care professional in a hospital or clinic setting. If you get this medicine at home, you will be taught how to prepare and give this medicine. Use exactly as directed. Take your medicine at regular intervals. Do not take your medicine more often than directed. It is important that you put your used needles and syringes in a special sharps container. Do not put them in a trash can. If you do not have a sharps container, call your pharmacist or healthcare provider to get one. A special MedGuide will be given to you by the pharmacist with each prescription and refill. Be sure to read this information carefully each time. Talk to your pediatrician regarding the use of this medicine in children. While this drug may be prescribed for selected conditions, precautions do apply. Overdosage: If you think you have taken too much of this medicine contact a poison  control center or emergency room at once. NOTE: This medicine is only for you. Do not share this medicine with others. What if I miss a dose? If you miss a dose, take it as soon as you can. If it is almost time for your next dose, take only that dose. Do not take double or extra doses. What may interact with this medicine? Interactions have not been studied. This list may not describe all possible interactions. Give your health care provider a list of all the medicines, herbs, non-prescription drugs, or dietary supplements you use. Also tell them if you smoke, drink alcohol, or use illegal drugs. Some items may interact with your medicine. What should I watch for while using this medicine? Your condition will be monitored carefully while you are receiving this medicine. You may need blood work done while you are taking this medicine. This medicine may cause a decrease in vitamin B6. You should make sure that you get enough vitamin B6 while you are taking this medicine. Discuss the foods you eat and the vitamins you take with your health care professional. What side effects may I notice from receiving this medicine? Side effects that you should report to your doctor or health care professional as soon as possible:  allergic reactions like skin rash, itching or hives, swelling of the face, lips, or tongue  seizures  signs and symptoms of a blood clot such as breathing problems; changes in vision; chest pain; severe, sudden headache; pain, swelling, warmth in the leg; trouble speaking; sudden numbness or   weakness of the face, arm or leg  signs and symptoms of a stroke like changes in vision; confusion; trouble speaking or understanding; severe headaches; sudden numbness or weakness of the face, arm or leg; trouble walking; dizziness; loss of balance or coordination Side effects that usually do not require medical attention (report to your doctor or health care professional if they continue or are  bothersome):  chills  cough  dizziness  fever  headaches  joint pain  muscle cramps  muscle pain  nausea, vomiting  pain, redness, or irritation at site where injected This list may not describe all possible side effects. Call your doctor for medical advice about side effects. You may report side effects to FDA at 1-800-FDA-1088. Where should I keep my medicine? Keep out of the reach of children. Store in a refrigerator between 2 and 8 degrees C (36 and 46 degrees F). Do not freeze or shake. Throw away any unused portion if using a single-dose vial. Multi-dose vials can be kept in the refrigerator for up to 21 days after the initial dose. Throw away unused medicine. NOTE: This sheet is a summary. It may not cover all possible information. If you have questions about this medicine, talk to your doctor, pharmacist, or health care provider.  2020 Elsevier/Gold Standard (2017-03-17 08:35:19) Ferric carboxymaltose injection What is this medicine? FERRIC CARBOXYMALTOSE (ferr-ik car-box-ee-mol-toes) is an iron complex. Iron is used to make healthy red blood cells, which carry oxygen and nutrients throughout the body. This medicine is used to treat anemia in people with chronic kidney disease or people who cannot take iron by mouth. This medicine may be used for other purposes; ask your health care provider or pharmacist if you have questions. COMMON BRAND NAME(S): Injectafer What should I tell my health care provider before I take this medicine? They need to know if you have any of these conditions:  high levels of iron in the blood  liver disease  an unusual or allergic reaction to iron, other medicines, foods, dyes, or preservatives  pregnant or trying to get pregnant  breast-feeding How should I use this medicine? This medicine is for infusion into a vein. It is given by a health care professional in a hospital or clinic setting. Talk to your pediatrician regarding the use  of this medicine in children. Special care may be needed. Overdosage: If you think you have taken too much of this medicine contact a poison control center or emergency room at once. NOTE: This medicine is only for you. Do not share this medicine with others. What if I miss a dose? It is important not to miss your dose. Call your doctor or health care professional if you are unable to keep an appointment. What may interact with this medicine? Do not take this medicine with any of the following medications:  deferoxamine  dimercaprol  other iron products This list may not describe all possible interactions. Give your health care provider a list of all the medicines, herbs, non-prescription drugs, or dietary supplements you use. Also tell them if you smoke, drink alcohol, or use illegal drugs. Some items may interact with your medicine. What should I watch for while using this medicine? Visit your doctor or health care professional regularly. Tell your doctor if your symptoms do not start to get better or if they get worse. You may need blood work done while you are taking this medicine. You may need to follow a special diet. Talk to your doctor. Foods that  contain iron include: whole grains/cereals, dried fruits, beans, or peas, leafy green vegetables, and organ meats (liver, kidney). What side effects may I notice from receiving this medicine? Side effects that you should report to your doctor or health care professional as soon as possible:  allergic reactions like skin rash, itching or hives, swelling of the face, lips, or tongue  dizziness  facial flushing Side effects that usually do not require medical attention (report to your doctor or health care professional if they continue or are bothersome):  changes in taste  constipation  headache  nausea, vomiting  pain, redness, or irritation at site where injected This list may not describe all possible side effects. Call your  doctor for medical advice about side effects. You may report side effects to FDA at 1-800-FDA-1088. Where should I keep my medicine? This drug is given in a hospital or clinic and will not be stored at home. NOTE: This sheet is a summary. It may not cover all possible information. If you have questions about this medicine, talk to your doctor, pharmacist, or health care provider.  2020 Elsevier/Gold Standard (2016-09-22 09:40:29)

## 2019-12-23 ENCOUNTER — Other Ambulatory Visit: Payer: Self-pay

## 2019-12-23 ENCOUNTER — Encounter (HOSPITAL_COMMUNITY)
Admission: RE | Admit: 2019-12-23 | Discharge: 2019-12-23 | Disposition: A | Discharge status: Home / Self Care | Payer: Medicare Other | Source: Ambulatory Visit | Attending: Nephrology | Admitting: Nephrology

## 2019-12-23 ENCOUNTER — Encounter (HOSPITAL_COMMUNITY): Payer: Self-pay

## 2019-12-23 DIAGNOSIS — N189 Chronic kidney disease, unspecified: Secondary | ICD-10-CM | POA: Insufficient documentation

## 2019-12-23 DIAGNOSIS — N2581 Secondary hyperparathyroidism of renal origin: Secondary | ICD-10-CM | POA: Insufficient documentation

## 2019-12-23 DIAGNOSIS — Z79899 Other long term (current) drug therapy: Secondary | ICD-10-CM | POA: Insufficient documentation

## 2019-12-23 DIAGNOSIS — E875 Hyperkalemia: Secondary | ICD-10-CM | POA: Diagnosis not present

## 2019-12-23 DIAGNOSIS — E559 Vitamin D deficiency, unspecified: Secondary | ICD-10-CM | POA: Insufficient documentation

## 2019-12-23 DIAGNOSIS — D631 Anemia in chronic kidney disease: Secondary | ICD-10-CM | POA: Diagnosis not present

## 2019-12-23 DIAGNOSIS — E1122 Type 2 diabetes mellitus with diabetic chronic kidney disease: Secondary | ICD-10-CM | POA: Insufficient documentation

## 2019-12-23 LAB — POCT HEMOGLOBIN-HEMACUE: Hemoglobin: 9.1 g/dL — ABNORMAL LOW (ref 13.0–17.0)

## 2019-12-23 MED ORDER — EPOETIN ALFA 3000 UNIT/ML IJ SOLN
3000.0000 [IU] | Freq: Once | INTRAMUSCULAR | Status: AC
  Administered 2019-12-23: 3000 [IU] via SUBCUTANEOUS
  Filled 2019-12-23: qty 1

## 2020-01-06 ENCOUNTER — Other Ambulatory Visit: Payer: Self-pay

## 2020-01-06 ENCOUNTER — Encounter (HOSPITAL_COMMUNITY)
Admission: RE | Admit: 2020-01-06 | Discharge: 2020-01-06 | Disposition: A | Discharge status: Home / Self Care | Payer: Medicare Other | Source: Ambulatory Visit | Attending: Nephrology | Admitting: Nephrology

## 2020-01-06 DIAGNOSIS — E1122 Type 2 diabetes mellitus with diabetic chronic kidney disease: Secondary | ICD-10-CM | POA: Diagnosis not present

## 2020-01-06 DIAGNOSIS — E875 Hyperkalemia: Secondary | ICD-10-CM | POA: Diagnosis not present

## 2020-01-06 DIAGNOSIS — E559 Vitamin D deficiency, unspecified: Secondary | ICD-10-CM | POA: Diagnosis not present

## 2020-01-06 DIAGNOSIS — Z79899 Other long term (current) drug therapy: Secondary | ICD-10-CM | POA: Diagnosis not present

## 2020-01-06 DIAGNOSIS — N189 Chronic kidney disease, unspecified: Secondary | ICD-10-CM | POA: Diagnosis not present

## 2020-01-06 DIAGNOSIS — D631 Anemia in chronic kidney disease: Secondary | ICD-10-CM | POA: Diagnosis not present

## 2020-01-06 DIAGNOSIS — N2581 Secondary hyperparathyroidism of renal origin: Secondary | ICD-10-CM | POA: Diagnosis not present

## 2020-01-06 LAB — RENAL FUNCTION PANEL
Albumin: 3 g/dL — ABNORMAL LOW (ref 3.5–5.0)
Anion gap: 6 (ref 5–15)
BUN: 29 mg/dL — ABNORMAL HIGH (ref 8–23)
CO2: 27 mmol/L (ref 22–32)
Calcium: 9 mg/dL (ref 8.9–10.3)
Chloride: 107 mmol/L (ref 98–111)
Creatinine, Ser: 1.73 mg/dL — ABNORMAL HIGH (ref 0.61–1.24)
GFR calc Af Amer: 41 mL/min — ABNORMAL LOW (ref >60)
GFR calc non Af Amer: 35 mL/min — ABNORMAL LOW (ref >60)
Glucose, Bld: 164 mg/dL — ABNORMAL HIGH (ref 70–99)
Phosphorus: 2.6 mg/dL (ref 2.5–4.6)
Potassium: 4.5 mmol/L (ref 3.5–5.1)
Sodium: 140 mmol/L (ref 135–145)

## 2020-01-06 LAB — POCT HEMOGLOBIN-HEMACUE: Hemoglobin: 10 g/dL — ABNORMAL LOW (ref 13.0–17.0)

## 2020-01-06 LAB — PROTEIN / CREATININE RATIO, URINE
Creatinine, Urine: 109.91 mg/dL
Protein Creatinine Ratio: 1.01 mg/mg{Cre} — ABNORMAL HIGH (ref 0.00–0.15)
Total Protein, Urine: 111 mg/dL

## 2020-01-06 LAB — IRON AND TIBC
Iron: 51 ug/dL (ref 45–182)
Saturation Ratios: 30 % (ref 17.9–39.5)
TIBC: 170 ug/dL — ABNORMAL LOW (ref 250–450)
UIBC: 119 ug/dL

## 2020-01-06 LAB — CBC
HCT: 30.7 % — ABNORMAL LOW (ref 39.0–52.0)
Hemoglobin: 9.1 g/dL — ABNORMAL LOW (ref 13.0–17.0)
MCH: 28.4 pg (ref 26.0–34.0)
MCHC: 29.6 g/dL — ABNORMAL LOW (ref 30.0–36.0)
MCV: 95.9 fL (ref 80.0–100.0)
Platelets: 261 10*3/uL (ref 150–400)
RBC: 3.2 MIL/uL — ABNORMAL LOW (ref 4.22–5.81)
RDW: 17.1 % — ABNORMAL HIGH (ref 11.5–15.5)
WBC: 10.6 10*3/uL — ABNORMAL HIGH (ref 4.0–10.5)
nRBC: 0 % (ref 0.0–0.2)

## 2020-01-06 LAB — FERRITIN: Ferritin: 389 ng/mL — ABNORMAL HIGH (ref 24–336)

## 2020-01-06 MED ORDER — EPOETIN ALFA 3000 UNIT/ML IJ SOLN: 3000.0000 [IU] | Freq: Once | INTRAMUSCULAR | Status: DC

## 2020-01-07 LAB — VITAMIN D 25 HYDROXY (VIT D DEFICIENCY, FRACTURES): Vit D, 25-Hydroxy: 12.27 ng/mL — ABNORMAL LOW (ref 30–100)

## 2020-01-07 LAB — PARATHYROID HORMONE, INTACT (NO CA): PTH: 107 pg/mL — ABNORMAL HIGH (ref 15–65)

## 2020-01-21 ENCOUNTER — Encounter (HOSPITAL_COMMUNITY)
Admission: RE | Admit: 2020-01-21 | Discharge: 2020-01-21 | Disposition: A | Discharge status: Home / Self Care | Payer: Medicare Other | Source: Ambulatory Visit | Attending: Nephrology | Admitting: Nephrology

## 2020-01-21 ENCOUNTER — Other Ambulatory Visit: Payer: Self-pay

## 2020-01-21 ENCOUNTER — Encounter (HOSPITAL_COMMUNITY): Payer: Self-pay

## 2020-01-21 DIAGNOSIS — D631 Anemia in chronic kidney disease: Secondary | ICD-10-CM | POA: Insufficient documentation

## 2020-01-21 DIAGNOSIS — N184 Chronic kidney disease, stage 4 (severe): Secondary | ICD-10-CM | POA: Diagnosis not present

## 2020-01-21 LAB — RENAL FUNCTION PANEL
Albumin: 3.1 g/dL — ABNORMAL LOW (ref 3.5–5.0)
Anion gap: 9 (ref 5–15)
BUN: 26 mg/dL — ABNORMAL HIGH (ref 8–23)
CO2: 26 mmol/L (ref 22–32)
Calcium: 9 mg/dL (ref 8.9–10.3)
Chloride: 102 mmol/L (ref 98–111)
Creatinine, Ser: 1.73 mg/dL — ABNORMAL HIGH (ref 0.61–1.24)
GFR calc Af Amer: 41 mL/min — ABNORMAL LOW (ref >60)
GFR calc non Af Amer: 35 mL/min — ABNORMAL LOW (ref >60)
Glucose, Bld: 169 mg/dL — ABNORMAL HIGH (ref 70–99)
Phosphorus: 2.6 mg/dL (ref 2.5–4.6)
Potassium: 4 mmol/L (ref 3.5–5.1)
Sodium: 137 mmol/L (ref 135–145)

## 2020-01-21 LAB — PROTEIN / CREATININE RATIO, URINE
Creatinine, Urine: 119.43 mg/dL
Protein Creatinine Ratio: 0.85 mg/mg{Cre} — ABNORMAL HIGH (ref 0.00–0.15)
Total Protein, Urine: 101 mg/dL

## 2020-01-21 LAB — CBC
HCT: 31 % — ABNORMAL LOW (ref 39.0–52.0)
Hemoglobin: 9.6 g/dL — ABNORMAL LOW (ref 13.0–17.0)
MCH: 29.5 pg (ref 26.0–34.0)
MCHC: 31 g/dL (ref 30.0–36.0)
MCV: 95.4 fL (ref 80.0–100.0)
Platelets: 243 10*3/uL (ref 150–400)
RBC: 3.25 MIL/uL — ABNORMAL LOW (ref 4.22–5.81)
RDW: 16.1 % — ABNORMAL HIGH (ref 11.5–15.5)
WBC: 12.8 10*3/uL — ABNORMAL HIGH (ref 4.0–10.5)
nRBC: 0 % (ref 0.0–0.2)

## 2020-01-21 LAB — POCT HEMOGLOBIN-HEMACUE: Hemoglobin: 9.4 g/dL — ABNORMAL LOW (ref 13.0–17.0)

## 2020-01-21 LAB — VITAMIN D 25 HYDROXY (VIT D DEFICIENCY, FRACTURES): Vit D, 25-Hydroxy: 14.9 ng/mL — ABNORMAL LOW (ref 30–100)

## 2020-01-21 LAB — FERRITIN: Ferritin: 337 ng/mL — ABNORMAL HIGH (ref 24–336)

## 2020-01-21 MED ORDER — EPOETIN ALFA 3000 UNIT/ML IJ SOLN
INTRAMUSCULAR | Status: AC
  Filled 2020-01-21: qty 1

## 2020-01-21 MED ORDER — EPOETIN ALFA 3000 UNIT/ML IJ SOLN
3000.0000 [IU] | Freq: Once | INTRAMUSCULAR | Status: AC
  Administered 2020-01-21: 3000 [IU] via SUBCUTANEOUS

## 2020-01-22 ENCOUNTER — Other Ambulatory Visit: Payer: Self-pay | Admitting: Cardiovascular Disease

## 2020-01-22 DIAGNOSIS — N189 Chronic kidney disease, unspecified: Secondary | ICD-10-CM | POA: Diagnosis not present

## 2020-01-22 DIAGNOSIS — E875 Hyperkalemia: Secondary | ICD-10-CM | POA: Diagnosis not present

## 2020-01-22 DIAGNOSIS — I2510 Atherosclerotic heart disease of native coronary artery without angina pectoris: Secondary | ICD-10-CM | POA: Diagnosis not present

## 2020-01-22 DIAGNOSIS — E211 Secondary hyperparathyroidism, not elsewhere classified: Secondary | ICD-10-CM | POA: Diagnosis not present

## 2020-01-22 DIAGNOSIS — E785 Hyperlipidemia, unspecified: Secondary | ICD-10-CM | POA: Diagnosis not present

## 2020-01-22 DIAGNOSIS — E1122 Type 2 diabetes mellitus with diabetic chronic kidney disease: Secondary | ICD-10-CM | POA: Diagnosis not present

## 2020-01-22 DIAGNOSIS — N184 Chronic kidney disease, stage 4 (severe): Secondary | ICD-10-CM | POA: Diagnosis not present

## 2020-01-22 DIAGNOSIS — D631 Anemia in chronic kidney disease: Secondary | ICD-10-CM | POA: Diagnosis not present

## 2020-01-22 DIAGNOSIS — R809 Proteinuria, unspecified: Secondary | ICD-10-CM | POA: Diagnosis not present

## 2020-01-22 DIAGNOSIS — I129 Hypertensive chronic kidney disease with stage 1 through stage 4 chronic kidney disease, or unspecified chronic kidney disease: Secondary | ICD-10-CM | POA: Diagnosis not present

## 2020-01-22 LAB — PARATHYROID HORMONE, INTACT (NO CA): PTH: 82 pg/mL — ABNORMAL HIGH (ref 15–65)

## 2020-02-04 ENCOUNTER — Encounter: Payer: Self-pay | Admitting: Dermatology

## 2020-02-04 ENCOUNTER — Encounter (HOSPITAL_COMMUNITY)
Admission: RE | Admit: 2020-02-04 | Discharge: 2020-02-04 | Disposition: A | Discharge status: Home / Self Care | Payer: Medicare Other | Source: Ambulatory Visit | Attending: Nephrology | Admitting: Nephrology

## 2020-02-04 ENCOUNTER — Ambulatory Visit: Payer: Medicare Other | Admitting: Dermatology

## 2020-02-04 ENCOUNTER — Encounter (HOSPITAL_COMMUNITY): Payer: Self-pay

## 2020-02-04 ENCOUNTER — Other Ambulatory Visit: Payer: Self-pay

## 2020-02-04 ENCOUNTER — Encounter (HOSPITAL_COMMUNITY): Payer: Medicare Other

## 2020-02-04 ENCOUNTER — Encounter (HOSPITAL_COMMUNITY): Admission: RE | Admit: 2020-02-04 | Payer: Medicare Other | Source: Ambulatory Visit

## 2020-02-04 DIAGNOSIS — D631 Anemia in chronic kidney disease: Secondary | ICD-10-CM | POA: Diagnosis not present

## 2020-02-04 DIAGNOSIS — C44329 Squamous cell carcinoma of skin of other parts of face: Secondary | ICD-10-CM

## 2020-02-04 DIAGNOSIS — C4441 Basal cell carcinoma of skin of scalp and neck: Secondary | ICD-10-CM | POA: Diagnosis not present

## 2020-02-04 DIAGNOSIS — N184 Chronic kidney disease, stage 4 (severe): Secondary | ICD-10-CM | POA: Diagnosis not present

## 2020-02-04 DIAGNOSIS — C4492 Squamous cell carcinoma of skin, unspecified: Secondary | ICD-10-CM

## 2020-02-04 LAB — POCT HEMOGLOBIN-HEMACUE: Hemoglobin: 9.2 g/dL — ABNORMAL LOW (ref 13.0–17.0)

## 2020-02-04 MED ORDER — EPOETIN ALFA 3000 UNIT/ML IJ SOLN
3000.0000 [IU] | Freq: Once | INTRAMUSCULAR | Status: AC
  Administered 2020-02-04: 3000 [IU] via SUBCUTANEOUS

## 2020-02-04 MED ORDER — EPOETIN ALFA 3000 UNIT/ML IJ SOLN
INTRAMUSCULAR | Status: AC
  Filled 2020-02-04: qty 1

## 2020-02-04 NOTE — Patient Instructions (Addendum)
Routine follow-up for an old friend Zachary Mcmahon date of birth 1931-12-06.  He has 3 lesions 1 on the left mandible one behind the right ear and 1 on the right lower back neck which are almost certainly non-melanoma skin cancer (the lower back neck basal cell, the others being squamous cell carcinoma).  These are growing slowly.  They are not really painful to Mr. Kochan, who is facing a myriad of other problems including increasing kidney failure with resultant nonproduction of red cells and need for transfusions with some discussion of dialysis.  If these were to get worse I will try to add him on as an urgent basis, but after 15-minute discussion the family and I are quite comfortable for now choosing nonintervention.  Below the right side of the neck is tan rough spot which is a keratosis which requires no treatment.

## 2020-02-18 ENCOUNTER — Encounter (HOSPITAL_COMMUNITY): Payer: Self-pay

## 2020-02-18 ENCOUNTER — Other Ambulatory Visit: Payer: Self-pay

## 2020-02-18 ENCOUNTER — Encounter (HOSPITAL_COMMUNITY)
Admission: RE | Admit: 2020-02-18 | Discharge: 2020-02-18 | Disposition: A | Discharge status: Home / Self Care | Payer: Medicare Other | Source: Ambulatory Visit | Attending: Nephrology | Admitting: Nephrology

## 2020-02-18 DIAGNOSIS — D696 Thrombocytopenia, unspecified: Secondary | ICD-10-CM | POA: Diagnosis not present

## 2020-02-18 DIAGNOSIS — D638 Anemia in other chronic diseases classified elsewhere: Secondary | ICD-10-CM | POA: Diagnosis not present

## 2020-02-18 DIAGNOSIS — D72829 Elevated white blood cell count, unspecified: Secondary | ICD-10-CM | POA: Diagnosis not present

## 2020-02-18 DIAGNOSIS — D649 Anemia, unspecified: Secondary | ICD-10-CM | POA: Diagnosis not present

## 2020-02-18 DIAGNOSIS — D631 Anemia in chronic kidney disease: Secondary | ICD-10-CM | POA: Diagnosis not present

## 2020-02-18 DIAGNOSIS — E1122 Type 2 diabetes mellitus with diabetic chronic kidney disease: Secondary | ICD-10-CM | POA: Diagnosis not present

## 2020-02-18 DIAGNOSIS — N184 Chronic kidney disease, stage 4 (severe): Secondary | ICD-10-CM | POA: Diagnosis not present

## 2020-02-18 LAB — POCT HEMOGLOBIN-HEMACUE: Hemoglobin: 10 g/dL — ABNORMAL LOW (ref 13.0–17.0)

## 2020-02-18 MED ORDER — EPOETIN ALFA 3000 UNIT/ML IJ SOLN: 3000.0000 [IU] | Freq: Once | INTRAMUSCULAR | Status: DC

## 2020-02-20 DIAGNOSIS — E1140 Type 2 diabetes mellitus with diabetic neuropathy, unspecified: Secondary | ICD-10-CM | POA: Diagnosis not present

## 2020-02-20 DIAGNOSIS — I129 Hypertensive chronic kidney disease with stage 1 through stage 4 chronic kidney disease, or unspecified chronic kidney disease: Secondary | ICD-10-CM | POA: Diagnosis not present

## 2020-02-20 DIAGNOSIS — N184 Chronic kidney disease, stage 4 (severe): Secondary | ICD-10-CM | POA: Diagnosis not present

## 2020-02-20 DIAGNOSIS — E1122 Type 2 diabetes mellitus with diabetic chronic kidney disease: Secondary | ICD-10-CM | POA: Diagnosis not present

## 2020-02-20 DIAGNOSIS — I2510 Atherosclerotic heart disease of native coronary artery without angina pectoris: Secondary | ICD-10-CM | POA: Diagnosis not present

## 2020-03-03 ENCOUNTER — Encounter (HOSPITAL_COMMUNITY): Payer: Medicare Other

## 2020-03-03 ENCOUNTER — Encounter (HOSPITAL_COMMUNITY)
Admission: RE | Admit: 2020-03-03 | Discharge: 2020-03-03 | Disposition: A | Discharge status: Home / Self Care | Payer: Medicare Other | Source: Ambulatory Visit | Attending: Nephrology | Admitting: Nephrology

## 2020-03-07 ENCOUNTER — Encounter: Payer: Self-pay | Admitting: Dermatology

## 2020-03-07 NOTE — Progress Notes (Signed)
   Follow-Up Visit   Subjective  Zachary Mcmahon is a 86 y.o. male who presents for the following: Skin Problem (right post neck x 5 months + itch, + bleed).  Growths Location: Neck Duration: Several months Quality:  Associated Signs/Symptoms: Modifying Factors:  Severity:  Timing: Context: History of multiple skin cancers  Objective  Well appearing patient in no apparent distress; mood and affect are within normal limits.  A focused examination was performed including Scalp, face, ears, neck, back.. Relevant physical exam findings are noted in the Assessment and Plan.   Assessment & Plan   Patient Instructions  Routine follow-up for an old friend Zachary Mcmahon date of birth [birth year].  He has 3 lesions 1 on the left mandible one behind the right ear and 1 on the right lower back neck which are almost certainly non-melanoma skin cancer (the lower back neck basal cell, the others being squamous cell carcinoma).  These are growing slowly.  They are not really painful to Zachary Mcmahon, who is facing a myriad of other problems including increasing kidney failure with resultant nonproduction of red cells and need for transfusions with some discussion of dialysis.  If these were to get worse I will try to add him on as an urgent basis, but after 15-minute discussion the family and I are quite comfortable for now choosing nonintervention.  Below the right side of the neck is tan rough spot which is a keratosis which requires no treatment.   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  I will try to add on Zachary Mcmahon in an urgent fashion if these get worse.  BCC (basal cell carcinoma), scalp/neck Right Posterior Neck  Treatment deferred pending the outcome of other more pressing medical issues      I, Lavonna Monarch, MD, have reviewed all documentation for this visit.  The documentation on 03/07/20 for the exam, diagnosis, procedures, and orders are all accurate and complete.

## 2020-03-12 DIAGNOSIS — I509 Heart failure, unspecified: Secondary | ICD-10-CM | POA: Diagnosis not present

## 2020-03-12 DIAGNOSIS — I129 Hypertensive chronic kidney disease with stage 1 through stage 4 chronic kidney disease, or unspecified chronic kidney disease: Secondary | ICD-10-CM | POA: Diagnosis not present

## 2020-04-17 NOTE — Addendum Note (Signed)
Encounter addended by: Holley Dexter, RN on: 04/17/2020 3:33 PM  Actions taken: Clinical Note Signed

## 2020-04-17 NOTE — Progress Notes (Signed)
Spoke to daughter Bonnell Public, who states that patient does not wish to continue with Epogen injections.

## 2020-05-13 DIAGNOSIS — E1140 Type 2 diabetes mellitus with diabetic neuropathy, unspecified: Secondary | ICD-10-CM | POA: Diagnosis not present

## 2020-05-13 DIAGNOSIS — I129 Hypertensive chronic kidney disease with stage 1 through stage 4 chronic kidney disease, or unspecified chronic kidney disease: Secondary | ICD-10-CM | POA: Diagnosis not present

## 2020-05-13 DIAGNOSIS — N184 Chronic kidney disease, stage 4 (severe): Secondary | ICD-10-CM | POA: Diagnosis not present

## 2020-05-13 DIAGNOSIS — E1122 Type 2 diabetes mellitus with diabetic chronic kidney disease: Secondary | ICD-10-CM | POA: Diagnosis not present

## 2020-05-13 DIAGNOSIS — E782 Mixed hyperlipidemia: Secondary | ICD-10-CM | POA: Diagnosis not present

## 2020-06-01 DIAGNOSIS — S61402A Unspecified open wound of left hand, initial encounter: Secondary | ICD-10-CM | POA: Diagnosis not present

## 2020-06-08 ENCOUNTER — Ambulatory Visit: Payer: Medicare Other | Admitting: Cardiovascular Disease

## 2020-06-08 DIAGNOSIS — Z712 Person consulting for explanation of examination or test findings: Secondary | ICD-10-CM | POA: Diagnosis not present

## 2020-06-08 DIAGNOSIS — S61402D Unspecified open wound of left hand, subsequent encounter: Secondary | ICD-10-CM | POA: Diagnosis not present

## 2020-06-08 DIAGNOSIS — L0231 Cutaneous abscess of buttock: Secondary | ICD-10-CM | POA: Diagnosis not present

## 2020-06-08 DIAGNOSIS — I2510 Atherosclerotic heart disease of native coronary artery without angina pectoris: Secondary | ICD-10-CM | POA: Diagnosis not present

## 2020-06-08 DIAGNOSIS — L989 Disorder of the skin and subcutaneous tissue, unspecified: Secondary | ICD-10-CM | POA: Diagnosis not present

## 2020-06-08 DIAGNOSIS — R944 Abnormal results of kidney function studies: Secondary | ICD-10-CM | POA: Diagnosis not present

## 2020-06-08 DIAGNOSIS — I509 Heart failure, unspecified: Secondary | ICD-10-CM | POA: Diagnosis not present

## 2020-06-26 DIAGNOSIS — H524 Presbyopia: Secondary | ICD-10-CM | POA: Diagnosis not present

## 2020-06-26 DIAGNOSIS — H02834 Dermatochalasis of left upper eyelid: Secondary | ICD-10-CM | POA: Diagnosis not present

## 2020-06-26 DIAGNOSIS — Z961 Presence of intraocular lens: Secondary | ICD-10-CM | POA: Diagnosis not present

## 2020-07-08 ENCOUNTER — Ambulatory Visit: Payer: Medicare Other | Admitting: Cardiology

## 2020-07-09 ENCOUNTER — Ambulatory Visit: Payer: Medicare Other | Admitting: Cardiology

## 2020-07-09 DIAGNOSIS — S50811A Abrasion of right forearm, initial encounter: Secondary | ICD-10-CM | POA: Diagnosis not present

## 2020-07-11 DIAGNOSIS — S40811D Abrasion of right upper arm, subsequent encounter: Secondary | ICD-10-CM | POA: Diagnosis not present

## 2020-07-11 DIAGNOSIS — L0390 Cellulitis, unspecified: Secondary | ICD-10-CM | POA: Diagnosis not present

## 2020-07-11 DIAGNOSIS — W19XXXD Unspecified fall, subsequent encounter: Secondary | ICD-10-CM | POA: Diagnosis not present

## 2020-07-13 DIAGNOSIS — S51812A Laceration without foreign body of left forearm, initial encounter: Secondary | ICD-10-CM | POA: Diagnosis not present

## 2020-07-20 DIAGNOSIS — S51812A Laceration without foreign body of left forearm, initial encounter: Secondary | ICD-10-CM | POA: Diagnosis not present

## 2020-07-21 NOTE — Progress Notes (Deleted)
Cardiology Office Note   Date:  07/21/2020   ID:  Zachary Mcmahon, DOB 1932-03-01, MRN [id number]  PCP:  Celene Squibb, MD  Cardiologist:   Minus Breeding, MD Referring:  ***  No chief complaint on file.     History of Present Illness: Zachary Mcmahon is a 86 y.o. male who presents for follow up of CAD/CABG.  He previously saw Dr. Bronson Ing.   ***     Past Medical History:  Diagnosis Date  . Arthritis    "legs" (02/07/2015)  . Basal cell carcinoma of forehead 2016 X 2  . Basal cell carcinoma of left earlobe   . CHF (congestive heart failure) ([place])   . Diabetic neuropathy ([place]) 09/04/2017  . Diabetic peripheral neuropathy ([place])    "left foot" (02/07/2015)  . 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 GERD (gastroesophageal reflux disease)   . High potassium   . History of gout X 1  . Hyperlipidemia   . Hypertension   . Old myocardial infarct    "sometime in the past; don't know when" (02/07/2015)  . Pain and swelling of left lower leg    chronic  . Pneumonia X 1  . Renal insufficiency   . Respiratory failure ([place])   . Type II diabetes mellitus ([place])   . Varicose veins     Past Surgical History:  Procedure Laterality Date  . BASAL CELL CARCINOMA EXCISION     "probably 1/2 dozen cut off face, left ear" (02/07/2015)  . CARDIAC CATHETERIZATION N/A 02/09/2015   Procedure: Left Heart Cath and Coronary Angiography;  Surgeon: Leonie Man, MD;  Location: [facility];  Service: Cardiovascular;  Laterality: N/A;  . CARDIOVASCULAR STRESS TEST  05/29/2012   Mild-moderate perfusion defect due to infarct/scar with mild-moderate perinfarct ischemia seen in the mid anterior, apical anterior, apical septal, and apical regions. No ECG changes. Global LV systolic function is severely reduced.  Marland Kitchen CATARACT EXTRACTION W/PHACO Left 11/12/2015   Procedure: CATARACT EXTRACTION PHACO AND INTRAOCULAR LENS PLACEMENT LEFT EYE;  Surgeon: Tonny Branch, MD;  Location: [facility];   Service: Ophthalmology;  Laterality: Left;  CDE: 9.82  . CATARACT EXTRACTION W/PHACO Right 12/14/2015   Procedure: CATARACT EXTRACTION PHACO AND INTRAOCULAR LENS PLACEMENT (IOC);  Surgeon: Tonny Branch, MD;  Location: [facility];  Service: Ophthalmology;  Laterality: Right;  CDE: 13.86  . CORONARY ARTERY BYPASS GRAFT N/A 02/17/2015   Procedure: CORONARY ARTERY BYPASS GRAFTING (CABG), ON PUMP, TIMES FIVE, USING LEFT INTERNAL MAMMARY ARTERY, RIGHT GREATER SAPHENOUS VEIN HARVESTED ENDOSCOPICALLY;  Surgeon: [name], MD;  Location: [facility];  Service: Open Heart Surgery;  Laterality: N/A;  -LIMA to LAD -SVG to DIAGONAL - SEQ SVG to OM1 and PLB -SVG to PDA  . CYSTOSCOPY W/ STONE MANIPULATION  X 1  . FRACTURE SURGERY    . KNEE ARTHROSCOPY Right ~ 2008  . LEXISCAM MYOCARDIAL PERFUSION  05/29/12   MARKED PERFUSION DEFECT DUE TO INFARC/SCAR WITH MILD PERINFARCT ISCHEMIA IN THE BASAL INFERIOR, MID INFEROSEPTAL, MID INFERIOR AND APICALINFERIOR REGION. EF%33%. PERIINFARCT ISCHEMIA IN THE MID ANTERIOR, APICAL ANTERIOR, APICAL SEPTAL AND APICAL REGIONS.  . LOWER EXTREMITY VENOUS DOPPLER  07/11/2012   No evidence of DVT in the left lower extremity. Evidence of partially recanalized, chronic, non-obstructive thrombus in the left great SV and its branches consistent with significant reflux consistent with post-phlebitic syndrome. Significant reflux of the left short saphenous vein.  Marland Kitchen open heart sx  02/17/15  .  ORIF HIP FRACTURE  03/20/2012   Procedure: OPEN REDUCTION INTERNAL FIXATION HIP;  Surgeon: Sanjuana Kava, MD;  Location: [facility];  Service: Orthopedics;  Laterality: Right;  . TEE WITHOUT CARDIOVERSION N/A 02/17/2015   Procedure: TRANSESOPHAGEAL ECHOCARDIOGRAM (TEE);  Surgeon: [name], MD;  Location: [place];  Service: Open Heart Surgery;  Laterality: N/A;  . TRANSTHORACIC ECHOCARDIOGRAM  04/18/2012   EF 45%, mild-moderate LVH  . TRANSTHORACIC ECHOCARDIOGRAM  04/18/12   EF% 45%.SEVERE HYPOKINESIS TO  AKINESIS OF THE MID-DISTAL INFEROLATERAL MYOCARDIUM AND MUCH OF THE APEX.     Current Outpatient Medications  Medication Sig Dispense Refill  . acetaminophen (TYLENOL) 500 MG tablet Take 500 mg by mouth every 6 (six) hours as needed for mild pain.    Marland Kitchen amLODipine (NORVASC) 5 MG tablet TAKE 1 TABLET BY MOUTH EVERY DAY 90 tablet 3  . atorvastatin (LIPITOR) 20 MG tablet Take 0.5 tablets by mouth daily.     . carvedilol (COREG) 6.25 MG tablet TAKE 1 TABLET BY MOUTH TWICE DAILY 180 tablet 3  . cholecalciferol (VITAMIN D) 1000 units tablet Take 1,000 Units by mouth daily.    Marland Kitchen epoetin alfa (EPOGEN) 3000 UNIT/ML injection Inject 3,000 Units into the vein every 14 (fourteen) days.    . ferrous sulfate 325 (65 FE) MG tablet Take 1 tablet (325 mg total) by mouth daily with breakfast.  3  . furosemide (LASIX) 20 MG tablet Take 20 mg by mouth daily.    Marland Kitchen losartan (COZAAR) 25 MG tablet Take 25 mg by mouth daily.    Marland Kitchen omeprazole (PRILOSEC) 20 MG capsule Take 20 mg by mouth daily.    Marland Kitchen OVER THE [place] extra strength probiotic daily    . traMADol (ULTRAM) 50 MG tablet Take 50 mg by mouth every 6 (six) hours as needed.     No current facility-administered medications for this visit.    Allergies:   Eliquis [apixaban], Bactrim [sulfamethoxazole-trimethoprim], and Hydralazine    ROS:  Please see the history of present illness.   Otherwise, review of systems are positive for {NONE DEFAULTED:18576::"none"}.   All other systems are reviewed and negative.    PHYSICAL EXAM: VS:  There were no vitals taken for this visit. , BMI There is no height or weight on file to calculate BMI. GENERAL:  Well appearing NECK:  No jugular venous distention, waveform within normal limits, carotid upstroke brisk and symmetric, no bruits, no thyromegaly LUNGS:  Clear to auscultation bilaterally CHEST:  Well healed sternotomy scar. HEART:  PMI not displaced or sustained,S1 and S2 within normal  limits, no S3, no S4, no clicks, no rubs, *** murmurs ABD:  Flat, positive bowel sounds normal in frequency in pitch, no bruits, no rebound, no guarding, no midline pulsatile mass, no hepatomegaly, no splenomegaly EXT:  2 plus pulses throughout, no edema, no cyanosis no clubbing    ***GENERAL:  Well appearing HEENT:  Pupils equal round and reactive, fundi not visualized, oral mucosa unremarkable NECK:  No jugular venous distention, waveform within normal limits, carotid upstroke brisk and symmetric, no bruits, no thyromegaly LYMPHATICS:  No cervical, inguinal adenopathy LUNGS:  Clear to auscultation bilaterally BACK:  No CVA tenderness CHEST:  Unremarkable HEART:  PMI not displaced or sustained,S1 and S2 within normal limits, no S3, no S4, no clicks, no rubs, *** murmurs ABD:  Flat, positive bowel sounds normal in frequency in pitch, no bruits, no rebound, no guarding, no midline pulsatile mass, no hepatomegaly, no splenomegaly EXT:  2 plus pulses throughout, no edema, no cyanosis no clubbing SKIN:  No rashes no nodules NEURO:  Cranial nerves II through XII grossly intact, motor grossly intact throughout PSYCH:  Cognitively intact, oriented to person place and time    EKG:  EKG {ACTION; IS/IS AVW:[id number]} ordered today. The ekg ordered today demonstrates ***   Recent Labs: 11/21/2019: ALT 6 01/21/2020: BUN 26; Creatinine, Ser 1.73; Platelets 243; Potassium 4.0; Sodium 137 02/18/2020: Hemoglobin 10.0    Lipid Panel    Component Value Date/Time   CHOL 83 02/07/2015 0533   TRIG 91 02/07/2015 0533   HDL 17 (L) 02/07/2015 0533   CHOLHDL 4.9 02/07/2015 0533   VLDL 18 02/07/2015 0533   LDLCALC 48 02/07/2015 0533      Wt Readings from Last 3 Encounters:  02/04/20 217 lb 6 oz (98.6 kg)  01/06/20 217 lb 6 oz (98.6 kg)  12/02/19 217 lb 6.4 oz (98.6 kg)      Other studies Reviewed: Additional studies/ records that were reviewed today include: ***. Review of the above records  demonstrates:  Please see elsewhere in the note.  ***   ASSESSMENT AND PLAN:  CAD:  ***  CKD IV:  ***   CHRONIC COMBINED SYSTOLIC AND DIASTOLIC HF:  ***   DYSLIPIDEMIA:  ***   HTN:  ***   Current medicines are reviewed at length with the patient today.  The patient {ACTIONS; HAS/DOES NOT HAVE:19233} concerns regarding medicines.  The following changes have been made:  {PLAN; NO CHANGE:13088:s}  Labs/ tests ordered today include: *** No orders of the defined types were placed in this encounter.    Disposition:   FU with ***    Signed, Minus Breeding, MD  07/21/2020 9:10 PM    [facility]

## 2020-07-22 ENCOUNTER — Ambulatory Visit: Payer: Medicare Other | Admitting: Cardiology

## 2020-07-22 ENCOUNTER — Encounter: Payer: Self-pay | Admitting: *Deleted

## 2020-07-22 DIAGNOSIS — I2510 Atherosclerotic heart disease of native coronary artery without angina pectoris: Secondary | ICD-10-CM

## 2020-07-22 DIAGNOSIS — I10 Essential (primary) hypertension: Secondary | ICD-10-CM

## 2020-07-22 DIAGNOSIS — E785 Hyperlipidemia, unspecified: Secondary | ICD-10-CM

## 2020-07-22 DIAGNOSIS — I5042 Chronic combined systolic (congestive) and diastolic (congestive) heart failure: Secondary | ICD-10-CM

## 2020-07-22 DIAGNOSIS — N184 Chronic kidney disease, stage 4 (severe): Secondary | ICD-10-CM

## 2020-07-27 DIAGNOSIS — H01001 Unspecified blepharitis right upper eyelid: Secondary | ICD-10-CM | POA: Diagnosis not present

## 2020-07-27 DIAGNOSIS — H52223 Regular astigmatism, bilateral: Secondary | ICD-10-CM | POA: Diagnosis not present

## 2020-07-27 DIAGNOSIS — H26493 Other secondary cataract, bilateral: Secondary | ICD-10-CM | POA: Diagnosis not present

## 2020-07-27 DIAGNOSIS — H01002 Unspecified blepharitis right lower eyelid: Secondary | ICD-10-CM | POA: Diagnosis not present

## 2020-07-27 DIAGNOSIS — H01005 Unspecified blepharitis left lower eyelid: Secondary | ICD-10-CM | POA: Diagnosis not present

## 2020-07-27 DIAGNOSIS — Z961 Presence of intraocular lens: Secondary | ICD-10-CM | POA: Diagnosis not present

## 2020-07-27 DIAGNOSIS — H353131 Nonexudative age-related macular degeneration, bilateral, early dry stage: Secondary | ICD-10-CM | POA: Diagnosis not present

## 2020-07-27 DIAGNOSIS — H01004 Unspecified blepharitis left upper eyelid: Secondary | ICD-10-CM | POA: Diagnosis not present

## 2020-07-27 NOTE — Progress Notes (Signed)
Cardiology Office Note  Date: 07/28/2020   ID: IWAO SHAMBLIN, DOB 1932/05/18, MRN [id number]  PCP:  Celene Squibb, MD  Cardiologist:  Minus Breeding, MD Electrophysiologist:  None   Chief Complaint: Follow-up CAD/CHF, DM 2, CKD 4, anemia  History of Present Illness: Zachary Mcmahon is a 86 y.o. male with a history of CAD status post CABG x 5 2016, pulmonary embolism, DM 2, CKD stage IV, anemia (recent  IV iron / PRBC's given), chronic lower extremity edema, HTN, HLD.[name] Dr. Bronson Ing February 21, 2019 via telemedicine.  At that visit he had no complaints of chest pain, shortness of breath, palpitations.  His chronic lower extremity swelling was stable.  He was wearing compression stockings.  He complained of bilateral foot neuropathy.  He was not on aspirin due to recurrent nosebleeds.  He had a Lexiscan Cardiolite stress test indicative of ischemia as a cause for EF reduction.  Patient did not want to proceed with coronary angiography.  Last seen by me on December 02, 2019.  He was seeing Dr. Theador Hawthorne for stage IV renal disease.  He recently had an iron infusion and subsequent packed red blood cell transfusions for significant anemia Hgb 6.9.  This occurred on April 1 and 2, 2021.  He was continuing his Coreg 6.25 mg p.o. twice daily, Lasix 20 mg daily, amlodipine 5 mg daily, losartan 25 mg daily.  Continuing statin medication atorvastatin 20 mg daily.  He is here today for 74-month follow-up.  He denies any recent acute illnesses or hospitalizations.  States he did fall in his bathroom recently due to a slip and injured his right arm with some abrasions.  Otherwise denies any recent issues.  Has stage IV renal disease.  Most recent renal function studies January 21, 2020 with creatinine 1.73 and GFR of 35.  Blood pressure well controlled today with BP of 110/62.  History of chronic anemia with most recent hemoglobin of 10 on 02/18/2020.  He states he was taking Epogen injections and ferrous sulfate.  He  states he is no longer taking these medications.  He has chronic lower extremity edema secondary to venous varicosities.  He wears compression stockings.  He uses a walker to ambulate.  He takes Lasix 20 mg daily.   Past Medical History:  Diagnosis Date  . Arthritis    "legs" (02/07/2015)  . Basal cell carcinoma of forehead 2016 X 2  . Basal cell carcinoma of left earlobe   . CHF (congestive heart failure) ([place])   . Diabetic neuropathy ([place]) 09/04/2017  . Diabetic peripheral neuropathy ([place])    "left foot" (02/07/2015)  . 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 GERD (gastroesophageal reflux disease)   . High potassium   . History of gout X 1  . Hyperlipidemia   . Hypertension   . Old myocardial infarct    "sometime in the past; don't know when" (02/07/2015)  . Pain and swelling of left lower leg    chronic  . Pneumonia X 1  . Renal insufficiency   . Respiratory failure ([place])   . Type II diabetes mellitus ([place])   . Varicose veins     Past Surgical History:  Procedure Laterality Date  . BASAL CELL CARCINOMA EXCISION     "probably 1/2 dozen cut off face, left ear" (02/07/2015)  . CARDIAC CATHETERIZATION N/A 02/09/2015   Procedure: Left Heart Cath and Coronary Angiography;  Surgeon: Leonie Man, MD;  Location: [facility]  INVASIVE CV LAB;  Service: Cardiovascular;  Laterality: N/A;  . CARDIOVASCULAR STRESS TEST  05/29/2012   Mild-moderate perfusion defect due to infarct/scar with mild-moderate perinfarct ischemia seen in the mid anterior, apical anterior, apical septal, and apical regions. No ECG changes. Global LV systolic function is severely reduced.  Marland Kitchen CATARACT EXTRACTION W/PHACO Left 11/12/2015   Procedure: CATARACT EXTRACTION PHACO AND INTRAOCULAR LENS PLACEMENT LEFT EYE;  Surgeon: Tonny Branch, MD;  Location: [facility];  Service: Ophthalmology;  Laterality: Left;  CDE: 9.82  . CATARACT EXTRACTION W/PHACO Right 12/14/2015   Procedure: CATARACT EXTRACTION PHACO AND  INTRAOCULAR LENS PLACEMENT (IOC);  Surgeon: Tonny Branch, MD;  Location: [facility];  Service: Ophthalmology;  Laterality: Right;  CDE: 13.86  . CORONARY ARTERY BYPASS GRAFT N/A 02/17/2015   Procedure: CORONARY ARTERY BYPASS GRAFTING (CABG), ON PUMP, TIMES FIVE, USING LEFT INTERNAL MAMMARY ARTERY, RIGHT GREATER SAPHENOUS VEIN HARVESTED ENDOSCOPICALLY;  Surgeon: [name], MD;  Location: [facility];  Service: Open Heart Surgery;  Laterality: N/A;  -LIMA to LAD -SVG to DIAGONAL - SEQ SVG to OM1 and PLB -SVG to PDA  . CYSTOSCOPY W/ STONE MANIPULATION  X 1  . FRACTURE SURGERY    . KNEE ARTHROSCOPY Right ~ 2008  . LEXISCAM MYOCARDIAL PERFUSION  05/29/12   MARKED PERFUSION DEFECT DUE TO INFARC/SCAR WITH MILD PERINFARCT ISCHEMIA IN THE BASAL INFERIOR, MID INFEROSEPTAL, MID INFERIOR AND APICALINFERIOR REGION. EF%33%. PERIINFARCT ISCHEMIA IN THE MID ANTERIOR, APICAL ANTERIOR, APICAL SEPTAL AND APICAL REGIONS.  . LOWER EXTREMITY VENOUS DOPPLER  07/11/2012   No evidence of DVT in the left lower extremity. Evidence of partially recanalized, chronic, non-obstructive thrombus in the left great SV and its branches consistent with significant reflux consistent with post-phlebitic syndrome. Significant reflux of the left short saphenous vein.  Marland Kitchen open heart sx  02/17/15  . ORIF HIP FRACTURE  03/20/2012   Procedure: OPEN REDUCTION INTERNAL FIXATION HIP;  Surgeon: Sanjuana Kava, MD;  Location: [facility];  Service: Orthopedics;  Laterality: Right;  . TEE WITHOUT CARDIOVERSION N/A 02/17/2015   Procedure: TRANSESOPHAGEAL ECHOCARDIOGRAM (TEE);  Surgeon: [name], MD;  Location: [facility];  Service: Open Heart Surgery;  Laterality: N/A;  . TRANSTHORACIC ECHOCARDIOGRAM  04/18/2012   EF 45%, mild-moderate LVH  . TRANSTHORACIC ECHOCARDIOGRAM  04/18/12   EF% 45%.SEVERE HYPOKINESIS TO AKINESIS OF THE MID-DISTAL INFEROLATERAL MYOCARDIUM AND MUCH OF THE APEX.    Current Outpatient Medications  Medication Sig Dispense Refill  .  acetaminophen (TYLENOL) 500 MG tablet Take 500 mg by mouth every 6 (six) hours as needed for mild pain.    Marland Kitchen amLODipine (NORVASC) 5 MG tablet TAKE 1 TABLET BY MOUTH EVERY DAY 90 tablet 3  . atorvastatin (LIPITOR) 20 MG tablet Take 0.5 tablets by mouth daily.     . carvedilol (COREG) 6.25 MG tablet TAKE 1 TABLET BY MOUTH TWICE DAILY 180 tablet 3  . cholecalciferol (VITAMIN D) 1000 units tablet Take 1,000 Units by mouth daily.    Marland Kitchen epoetin alfa (EPOGEN) 3000 UNIT/ML injection Inject 3,000 Units into the vein every 14 (fourteen) days.    . ferrous sulfate 325 (65 FE) MG tablet Take 1 tablet (325 mg total) by mouth daily with breakfast.  3  . furosemide (LASIX) 20 MG tablet Take 20 mg by mouth daily.    Marland Kitchen losartan (COZAAR) 25 MG tablet Take 25 mg by mouth daily.    Marland Kitchen omeprazole (PRILOSEC) 20 MG capsule Take 20 mg by mouth daily.    Marland Kitchen OVER  [place] extra strength probiotic daily    . traMADol (ULTRAM) 50 MG tablet Take 50 mg by mouth every 6 (six) hours as needed.     No current facility-administered medications for this visit.   Allergies:  Eliquis [apixaban], Other, Bactrim [sulfamethoxazole-trimethoprim], and Hydralazine   Social History: The patient  reports that he has never smoked. He has never used smokeless tobacco. He reports that he does not drink alcohol and does not use drugs.   Family History: The patient's family history includes Deep vein thrombosis in his mother; Diabetes in his brother; Heart attack in his brother; Hyperlipidemia in his brother; Hypertension in his brother, mother, and sister.   ROS:  Please see the history of present illness. Otherwise, complete review of systems is positive for none.  All other systems are reviewed and negative.   Physical Exam: VS:  BP 110/62   Pulse (!) 57   Ht 5' 9.5" (1.765 m)   Wt 183 lb (83 kg)   SpO2 97%   BMI 26.64 kg/m , BMI Body mass index is 26.64 kg/m.  Wt Readings from Last 3 Encounters:    07/28/20 183 lb (83 kg)  02/04/20 217 lb 6 oz (98.6 kg)  01/06/20 217 lb 6 oz (98.6 kg)    General: Patient appears comfortable at rest. Neck: Supple, no elevated JVP or carotid bruits, no thyromegaly. Lungs: Clear to auscultation, nonlabored breathing at rest. Cardiac: Regular rate and rhythm, no S3 or significant systolic murmur, no pericardial rub. Extremities: No pitting edema, distal pulses 2+. Skin: Warm and dry. Musculoskeletal: No kyphosis. Neuropsychiatric: Alert and oriented x3, affect grossly appropriate.  ECG:  An ECG dated 12/02/2019 was personally reviewed today and demonstrated:  Sinus rhythm first-degree AV block occasional ectopic ventricular beat rate of 70, old anterior infarct, diffuse nonspecific T wave abnormality  Recent Labwork: 11/21/2019: ALT 6; AST 9 01/21/2020: BUN 26; Creatinine, Ser 1.73; Platelets 243; Potassium 4.0; Sodium 137 02/18/2020: Hemoglobin 10.0     Component Value Date/Time   CHOL 83 02/07/2015 0533   TRIG 91 02/07/2015 0533   HDL 17 (L) 02/07/2015 0533   CHOLHDL 4.9 02/07/2015 0533   VLDL 18 02/07/2015 0533   LDLCALC 48 02/07/2015 0533    Other Studies Reviewed Today:  Echocardiogram 12/18/2017 Mild left ventricular enlargement and hypertrophy with inferior/posterior akinesis with severely decreased contraction overall estimated.  Ejection fraction  25 to 30%; moderately dilated left atrium, moderate pulmonary hypertension, moderately dilated right ventricle with moderately decreased contraction; mildly dilated right atrium, mild aortic stenosis, annular calcification and thickened mitral valve leaflets with moderate mitral regurgitation, moderate tricuspid regurgitation, atrial septal aneurysm   Assessment and Plan:   1. CAD in native artery Status post CABG times  X 5 2016.  He denies any recent anginal or exertional symptoms.  He is not very active on a daily basis due to multiple medical problems including stage IV renal failure,  anemia.  Continue Coreg 6.25 mg p.o. twice daily.   2. Chronic combined systolic and diastolic heart failure (HCC) Last echocardiogram showed EF of 25 to 30%.  Continue Coreg 6.25 p.o. twice daily, continue Lasix 20 mg daily,  3. Essential hypertension Blood pressure well controlled today BP today 110/62.  Continue amlodipine 5 mg, losartan 25 mg daily.  Carvedilol 6.25 mg p.o. twice daily.  4. Hyperlipidemia LDL goal <70 Last lipid panel from PCP office February 2020 showed total cholesterol 77, triglycerides 49, HDL 22, LDL 45.  Continue  atorvastatin 20 mg daily  5. Chronic kidney disease (CKD), stage IV (severe) ([place]) Sees nephrology Dr. Theador Hawthorne.  Recent creatinine January 21, 2020 1.73, GFR 35.  Medication Adjustments/Labs and Tests Ordered: Current medicines are reviewed at length with the patient today.  Concerns regarding medicines are outlined above.   Disposition: Follow-up with Dr. Domenic Polite or APP 6 months Signed, Levell July, NP 07/28/2020 3:47 PM    [facility], [place], [place] [address] Phone: [phone]; Fax: [phone]

## 2020-07-28 ENCOUNTER — Encounter: Payer: Self-pay | Admitting: Family Medicine

## 2020-07-28 ENCOUNTER — Ambulatory Visit (INDEPENDENT_AMBULATORY_CARE_PROVIDER_SITE_OTHER): Payer: Medicare Other | Admitting: Family Medicine

## 2020-07-28 VITALS — BP 110/62 | HR 57 | Ht 69.5 in | Wt 183.0 lb

## 2020-07-28 DIAGNOSIS — E785 Hyperlipidemia, unspecified: Secondary | ICD-10-CM

## 2020-07-28 DIAGNOSIS — I10 Essential (primary) hypertension: Secondary | ICD-10-CM

## 2020-07-28 DIAGNOSIS — N184 Chronic kidney disease, stage 4 (severe): Secondary | ICD-10-CM | POA: Diagnosis not present

## 2020-07-28 DIAGNOSIS — I2510 Atherosclerotic heart disease of native coronary artery without angina pectoris: Secondary | ICD-10-CM

## 2020-07-28 DIAGNOSIS — I5042 Chronic combined systolic (congestive) and diastolic (congestive) heart failure: Secondary | ICD-10-CM

## 2020-07-28 NOTE — Patient Instructions (Addendum)

## 2020-08-11 DIAGNOSIS — Z961 Presence of intraocular lens: Secondary | ICD-10-CM | POA: Diagnosis not present

## 2020-08-11 DIAGNOSIS — H52223 Regular astigmatism, bilateral: Secondary | ICD-10-CM | POA: Diagnosis not present

## 2020-08-20 DIAGNOSIS — M25562 Pain in left knee: Secondary | ICD-10-CM | POA: Diagnosis not present

## 2020-08-20 DIAGNOSIS — R600 Localized edema: Secondary | ICD-10-CM | POA: Diagnosis not present

## 2020-08-26 DIAGNOSIS — S7291XA Unspecified fracture of right femur, initial encounter for closed fracture: Secondary | ICD-10-CM | POA: Diagnosis not present

## 2020-08-26 DIAGNOSIS — K8020 Calculus of gallbladder without cholecystitis without obstruction: Secondary | ICD-10-CM | POA: Diagnosis not present

## 2020-08-26 DIAGNOSIS — E119 Type 2 diabetes mellitus without complications: Secondary | ICD-10-CM | POA: Diagnosis not present

## 2020-08-26 DIAGNOSIS — Z888 Allergy status to other drugs, medicaments and biological substances status: Secondary | ICD-10-CM | POA: Diagnosis not present

## 2020-08-26 DIAGNOSIS — I110 Hypertensive heart disease with heart failure: Secondary | ICD-10-CM | POA: Diagnosis not present

## 2020-08-26 DIAGNOSIS — K922 Gastrointestinal hemorrhage, unspecified: Secondary | ICD-10-CM | POA: Diagnosis not present

## 2020-08-26 DIAGNOSIS — Z743 Need for continuous supervision: Secondary | ICD-10-CM | POA: Diagnosis not present

## 2020-08-26 DIAGNOSIS — R140 Abdominal distension (gaseous): Secondary | ICD-10-CM | POA: Diagnosis not present

## 2020-08-26 DIAGNOSIS — D649 Anemia, unspecified: Secondary | ICD-10-CM | POA: Diagnosis not present

## 2020-08-26 DIAGNOSIS — I2510 Atherosclerotic heart disease of native coronary artery without angina pectoris: Secondary | ICD-10-CM | POA: Diagnosis not present

## 2020-08-26 DIAGNOSIS — R531 Weakness: Secondary | ICD-10-CM | POA: Diagnosis not present

## 2020-08-26 DIAGNOSIS — B372 Candidiasis of skin and nail: Secondary | ICD-10-CM | POA: Diagnosis not present

## 2020-08-26 DIAGNOSIS — K7460 Unspecified cirrhosis of liver: Secondary | ICD-10-CM | POA: Diagnosis not present

## 2020-08-26 DIAGNOSIS — K4090 Unilateral inguinal hernia, without obstruction or gangrene, not specified as recurrent: Secondary | ICD-10-CM | POA: Diagnosis not present

## 2020-08-26 DIAGNOSIS — I50812 Chronic right heart failure: Secondary | ICD-10-CM | POA: Diagnosis not present

## 2020-08-26 DIAGNOSIS — R791 Abnormal coagulation profile: Secondary | ICD-10-CM | POA: Diagnosis not present

## 2020-08-26 DIAGNOSIS — R188 Other ascites: Secondary | ICD-10-CM | POA: Diagnosis not present

## 2020-08-26 DIAGNOSIS — R58 Hemorrhage, not elsewhere classified: Secondary | ICD-10-CM | POA: Diagnosis not present

## 2020-08-26 DIAGNOSIS — U071 COVID-19: Secondary | ICD-10-CM | POA: Diagnosis not present

## 2020-08-26 DIAGNOSIS — J811 Chronic pulmonary edema: Secondary | ICD-10-CM | POA: Diagnosis not present

## 2020-09-28 ENCOUNTER — Ambulatory Visit: Payer: Medicare Other | Admitting: Gastroenterology

## 2020-10-01 DIAGNOSIS — K4030 Unilateral inguinal hernia, with obstruction, without gangrene, not specified as recurrent: Secondary | ICD-10-CM | POA: Diagnosis not present

## 2020-10-01 DIAGNOSIS — L989 Disorder of the skin and subcutaneous tissue, unspecified: Secondary | ICD-10-CM | POA: Diagnosis not present

## 2020-10-01 DIAGNOSIS — R944 Abnormal results of kidney function studies: Secondary | ICD-10-CM | POA: Diagnosis not present

## 2020-10-01 DIAGNOSIS — I2510 Atherosclerotic heart disease of native coronary artery without angina pectoris: Secondary | ICD-10-CM | POA: Diagnosis not present

## 2020-10-01 DIAGNOSIS — I509 Heart failure, unspecified: Secondary | ICD-10-CM | POA: Diagnosis not present

## 2020-10-07 ENCOUNTER — Other Ambulatory Visit: Payer: Self-pay

## 2020-10-07 ENCOUNTER — Emergency Department (HOSPITAL_COMMUNITY)
Admission: EM | Admit: 2020-10-07 | Discharge: 2020-10-07 | Disposition: A | Discharge status: Home / Self Care | Payer: Medicare Other | Attending: Emergency Medicine | Admitting: Emergency Medicine

## 2020-10-07 ENCOUNTER — Encounter (HOSPITAL_COMMUNITY): Payer: Self-pay

## 2020-10-07 DIAGNOSIS — I130 Hypertensive heart and chronic kidney disease with heart failure and stage 1 through stage 4 chronic kidney disease, or unspecified chronic kidney disease: Secondary | ICD-10-CM | POA: Insufficient documentation

## 2020-10-07 DIAGNOSIS — K625 Hemorrhage of anus and rectum: Secondary | ICD-10-CM | POA: Diagnosis not present

## 2020-10-07 DIAGNOSIS — E1169 Type 2 diabetes mellitus with other specified complication: Secondary | ICD-10-CM | POA: Diagnosis not present

## 2020-10-07 DIAGNOSIS — I5043 Acute on chronic combined systolic (congestive) and diastolic (congestive) heart failure: Secondary | ICD-10-CM | POA: Insufficient documentation

## 2020-10-07 DIAGNOSIS — N1831 Chronic kidney disease, stage 3a: Secondary | ICD-10-CM | POA: Diagnosis not present

## 2020-10-07 DIAGNOSIS — I25110 Atherosclerotic heart disease of native coronary artery with unstable angina pectoris: Secondary | ICD-10-CM | POA: Diagnosis not present

## 2020-10-07 DIAGNOSIS — E1122 Type 2 diabetes mellitus with diabetic chronic kidney disease: Secondary | ICD-10-CM | POA: Insufficient documentation

## 2020-10-07 DIAGNOSIS — K429 Umbilical hernia without obstruction or gangrene: Secondary | ICD-10-CM

## 2020-10-07 DIAGNOSIS — D631 Anemia in chronic kidney disease: Secondary | ICD-10-CM | POA: Diagnosis not present

## 2020-10-07 DIAGNOSIS — Z79899 Other long term (current) drug therapy: Secondary | ICD-10-CM | POA: Diagnosis not present

## 2020-10-07 DIAGNOSIS — E785 Hyperlipidemia, unspecified: Secondary | ICD-10-CM | POA: Diagnosis not present

## 2020-10-07 DIAGNOSIS — E1142 Type 2 diabetes mellitus with diabetic polyneuropathy: Secondary | ICD-10-CM | POA: Insufficient documentation

## 2020-10-07 DIAGNOSIS — I129 Hypertensive chronic kidney disease with stage 1 through stage 4 chronic kidney disease, or unspecified chronic kidney disease: Secondary | ICD-10-CM | POA: Diagnosis not present

## 2020-10-07 DIAGNOSIS — Z951 Presence of aortocoronary bypass graft: Secondary | ICD-10-CM | POA: Diagnosis not present

## 2020-10-07 DIAGNOSIS — Z85828 Personal history of other malignant neoplasm of skin: Secondary | ICD-10-CM | POA: Insufficient documentation

## 2020-10-07 DIAGNOSIS — N184 Chronic kidney disease, stage 4 (severe): Secondary | ICD-10-CM | POA: Diagnosis not present

## 2020-10-07 DIAGNOSIS — K4090 Unilateral inguinal hernia, without obstruction or gangrene, not specified as recurrent: Secondary | ICD-10-CM

## 2020-10-07 DIAGNOSIS — D649 Anemia, unspecified: Secondary | ICD-10-CM

## 2020-10-07 LAB — BASIC METABOLIC PANEL
Anion gap: <3 — ABNORMAL LOW (ref 5–15)
BUN: 32 mg/dL — ABNORMAL HIGH (ref 8–23)
CO2: 23 mmol/L (ref 22–32)
Calcium: 8.6 mg/dL — ABNORMAL LOW (ref 8.9–10.3)
Chloride: 110 mmol/L (ref 98–111)
Creatinine, Ser: 1.68 mg/dL — ABNORMAL HIGH (ref 0.61–1.24)
GFR, Estimated: 39 mL/min — ABNORMAL LOW (ref >60)
Glucose, Bld: 142 mg/dL — ABNORMAL HIGH (ref 70–99)
Potassium: 4.3 mmol/L (ref 3.5–5.1)
Sodium: 135 mmol/L (ref 135–145)

## 2020-10-07 LAB — CBC
HCT: 27.4 % — ABNORMAL LOW (ref 39.0–52.0)
Hemoglobin: 8.1 g/dL — ABNORMAL LOW (ref 13.0–17.0)
MCH: 28.8 pg (ref 26.0–34.0)
MCHC: 29.6 g/dL — ABNORMAL LOW (ref 30.0–36.0)
MCV: 97.5 fL (ref 80.0–100.0)
Platelets: 277 K/uL (ref 150–400)
RBC: 2.81 MIL/uL — ABNORMAL LOW (ref 4.22–5.81)
RDW: 15.7 % — ABNORMAL HIGH (ref 11.5–15.5)
WBC: 12.3 K/uL — ABNORMAL HIGH (ref 4.0–10.5)
nRBC: 0 % (ref 0.0–0.2)

## 2020-10-07 LAB — TYPE AND SCREEN
ABO/RH(D): A NEG
Antibody Screen: NEGATIVE

## 2020-10-07 NOTE — ED Triage Notes (Signed)
Pt to er, daughter states that pt was seen at [facility] on the 4th of January for rectal bleeding, states that when he was there he was positive for covid, states that he was d/c on the 12th.  States that he was a little better.  States that last Sunday he passed some more blood from his rectum.  Daughter states that she took him to doctor [place] Crane and the provider called today to say that his hemoglobin was 8, states that this is down from the 8.8 and UNCR, states that the doctor told them to come to the er.

## 2020-10-07 NOTE — ED Provider Notes (Signed)
[facility] EMERGENCY DEPARTMENT Provider Note   CSN: [id number] Arrival date & time: 10/07/20  1447     History Chief Complaint  Patient presents with  . Rectal Bleeding    Zachary Mcmahon is a 87 y.o. male.  Patient with hx diverticula, c/o sent by doctor to check for gi bleeding. Pt indicates a few days ago noted bright red blood in stool, and his doctor called today to say his hemoglobin was 8 (prior hemoglobin 8.8), and to go to ER. Pt indicates recently hospitalized at [facility] for same. Denies abd pain. No vomiting. No other abnormal bruising or bleeding. No anticoag use. No faintness or dizziness. No syncope. No chest pain or discomfort. No sob. No fever or chills.   The history is provided by the patient. The history is limited by the condition of the patient.  Rectal Bleeding Associated symptoms: no abdominal pain, no fever, no light-headedness and no vomiting        Past Medical History:  Diagnosis Date  . Arthritis    "legs" (02/07/2015)  . Basal cell carcinoma of forehead 2016 X 2  . Basal cell carcinoma of left earlobe   . CHF (congestive heart failure) ([place])   . Diabetic neuropathy ([place]) 09/04/2017  . Diabetic peripheral neuropathy ([place])    "left foot" (02/07/2015)  . 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 GERD (gastroesophageal reflux disease)   . High potassium   . History of gout X 1  . Hyperlipidemia   . Hypertension   . Old myocardial infarct    "sometime in the past; don't know when" (02/07/2015)  . Pain and swelling of left lower leg    chronic  . Pneumonia X 1  . Renal insufficiency   . Respiratory failure ([place])   . Type II diabetes mellitus ([place])   . Varicose veins     Patient Active Problem List   Diagnosis Date Noted  . Diabetic neuropathy ([place]) 09/04/2017  . Clostridium difficile infection 08/09/2016  . Acute on chronic combined systolic and diastolic CHF (congestive heart failure) ([place]) 10/03/2015  . Chronic  anticoagulation 10/03/2015  . CKD (chronic kidney disease), stage III ([place]) 10/03/2015  . Chronic kidney disease (CKD), stage IV (severe) ([place]) 03/05/2015  . Acute pulmonary embolism ([place]) 03/04/2015  . Pulmonary embolism ([place]) 03/03/2015  . 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 Pressure ulcer 02/14/2015  . CHF (congestive heart failure) ([place])   . Renal insufficiency   . Acute respiratory failure with hypoxia ([place]) 02/12/2015  . Arterial hypotension   . Pulmonary edema   . Acute on chronic combined systolic and diastolic HF (heart failure) ([place])   . Abdominal distension   . Acute renal failure superimposed on stage 3 chronic kidney disease ([place])   . NSTEMI (non-ST elevated myocardial infarction) ([place])   . Non-STEMI (non-ST elevated myocardial infarction) ([place]) 02/07/2015  . Unstable angina ([place]) 02/06/2015  . CKD (chronic kidney disease) stage 3, GFR 30-59 ml/min (HCC) 02/06/2015  . Chest pain 02/06/2015  . Abdominal pain, acute, generalized 02/06/2015  . Diarrhea 02/06/2015  . Swelling of extremity, left, chronic 02/06/2015  . Ileus ([place]) 02/06/2015  . Hyperlipidemia 04/23/2014  . Coronary artery disease 04/23/2014  . Varicose veins of lower extremities with other complications 03/50/0938  . Unspecified hemorrhoids with other complication 18/29/9371  . Intertrochanteric fracture of right hip ([place]) 03/20/2012  . HTN (hypertension), malignant 03/20/2012  . DM type 2 (diabetes mellitus, type 2) ([place]) 03/20/2012  .  Acute renal failure ([place]) 03/20/2012  . Thrombocytopenia ([place]) 03/20/2012  . Hyperkalemia 03/20/2012    Past Surgical History:  Procedure Laterality Date  . BASAL CELL CARCINOMA EXCISION     "probably 1/2 dozen cut off face, left ear" (02/07/2015)  . CARDIAC CATHETERIZATION N/A 02/09/2015   Procedure: Left Heart Cath and Coronary Angiography;  Surgeon: Leonie Man, MD;  Location: [facility];  Service: Cardiovascular;  Laterality: N/A;  . CARDIOVASCULAR STRESS TEST   05/29/2012   Mild-moderate perfusion defect due to infarct/scar with mild-moderate perinfarct ischemia seen in the mid anterior, apical anterior, apical septal, and apical regions. No ECG changes. Global LV systolic function is severely reduced.  Marland Kitchen CATARACT EXTRACTION W/PHACO Left 11/12/2015   Procedure: CATARACT EXTRACTION PHACO AND INTRAOCULAR LENS PLACEMENT LEFT EYE;  Surgeon: Tonny Branch, MD;  Location: [facility];  Service: Ophthalmology;  Laterality: Left;  CDE: 9.82  . CATARACT EXTRACTION W/PHACO Right 12/14/2015   Procedure: CATARACT EXTRACTION PHACO AND INTRAOCULAR LENS PLACEMENT (IOC);  Surgeon: Tonny Branch, MD;  Location: [facility];  Service: Ophthalmology;  Laterality: Right;  CDE: 13.86  . CORONARY ARTERY BYPASS GRAFT N/A 02/17/2015   Procedure: CORONARY ARTERY BYPASS GRAFTING (CABG), ON PUMP, TIMES FIVE, USING LEFT INTERNAL MAMMARY ARTERY, RIGHT GREATER SAPHENOUS VEIN HARVESTED ENDOSCOPICALLY;  Surgeon: [name], MD;  Location: [facility];  Service: Open Heart Surgery;  Laterality: N/A;  -LIMA to LAD -SVG to DIAGONAL - SEQ SVG to OM1 and PLB -SVG to PDA  . CYSTOSCOPY W/ STONE MANIPULATION  X 1  . FRACTURE SURGERY    . KNEE ARTHROSCOPY Right ~ 2008  . LEXISCAM MYOCARDIAL PERFUSION  05/29/12   MARKED PERFUSION DEFECT DUE TO INFARC/SCAR WITH MILD PERINFARCT ISCHEMIA IN THE BASAL INFERIOR, MID INFEROSEPTAL, MID INFERIOR AND APICALINFERIOR REGION. EF%33%. PERIINFARCT ISCHEMIA IN THE MID ANTERIOR, APICAL ANTERIOR, APICAL SEPTAL AND APICAL REGIONS.  . LOWER EXTREMITY VENOUS DOPPLER  07/11/2012   No evidence of DVT in the left lower extremity. Evidence of partially recanalized, chronic, non-obstructive thrombus in the left great SV and its branches consistent with significant reflux consistent with post-phlebitic syndrome. Significant reflux of the left short saphenous vein.  Marland Kitchen open heart sx  02/17/15  . ORIF HIP FRACTURE  03/20/2012   Procedure: OPEN REDUCTION INTERNAL FIXATION HIP;  Surgeon: Sanjuana Kava, MD;  Location: [facility];  Service: Orthopedics;  Laterality: Right;  . TEE WITHOUT CARDIOVERSION N/A 02/17/2015   Procedure: TRANSESOPHAGEAL ECHOCARDIOGRAM (TEE);  Surgeon: [name], MD;  Location: [facility];  Service: Open Heart Surgery;  Laterality: N/A;  . TRANSTHORACIC ECHOCARDIOGRAM  04/18/2012   EF 45%, mild-moderate LVH  . TRANSTHORACIC ECHOCARDIOGRAM  04/18/12   EF% 45%.SEVERE HYPOKINESIS TO AKINESIS OF THE MID-DISTAL INFEROLATERAL MYOCARDIUM AND MUCH OF THE APEX.       Family History  Problem Relation Age of Onset  . Deep vein thrombosis Mother   . Hypertension Mother   . Hypertension Sister   . Diabetes Brother   . Hyperlipidemia Brother   . Hypertension Brother   . Heart attack Brother   . Colon cancer Neg Hx     Social History   Tobacco Use  . Smoking status: Never Smoker  . Smokeless tobacco: Never Used  Vaping Use  . Vaping Use: Never used  Substance Use Topics  . Alcohol use: No    Alcohol/week: 0.0 standard drinks  . Drug use: No    Home Medications Prior to Admission medications   Medication Sig  Start Date End Date Taking? Authorizing Provider  acetaminophen (TYLENOL) 500 MG tablet Take 500 mg by mouth every 6 (six) hours as needed for mild pain.    [provider]  amLODipine (NORVASC) 5 MG tablet TAKE 1 TABLET BY MOUTH EVERY DAY 01/22/20   Verta Ellen., NP  atorvastatin (LIPITOR) 20 MG tablet Take 0.5 tablets by mouth daily.  12/22/16   [provider]  carvedilol (COREG) 6.25 MG tablet TAKE 1 TABLET BY MOUTH TWICE DAILY 01/22/20   Verta Ellen., NP  cholecalciferol (VITAMIN D) 1000 units tablet Take 1,000 Units by mouth daily.    [provider]  epoetin alfa (EPOGEN) 3000 UNIT/ML injection Inject 3,000 Units into the vein every 14 (fourteen) days.    [provider]  ferrous sulfate 325 (65 FE) MG tablet Take 1 tablet (325 mg total) by mouth daily with breakfast. 02/24/15   Nani Skillern, PA-C   furosemide (LASIX) 20 MG tablet Take 20 mg by mouth daily.    [provider]  losartan (COZAAR) 25 MG tablet Take 25 mg by mouth daily. 12/22/16   [provider]  omeprazole (PRILOSEC) 20 MG capsule Take 20 mg by mouth daily. 12/23/16   [provider]  [place] extra strength probiotic daily    [provider]  traMADol (ULTRAM) 50 MG tablet Take 50 mg by mouth every 6 (six) hours as needed.    [provider]    Allergies    Eliquis [apixaban], Other, Bactrim [sulfamethoxazole-trimethoprim], and Hydralazine  Review of Systems   Review of Systems  Constitutional: Negative for fever.  HENT: Negative for sore throat.   Eyes: Negative for redness.  Respiratory: Negative for shortness of breath.   Cardiovascular: Negative for chest pain.  Gastrointestinal: Positive for hematochezia. Negative for abdominal pain, constipation, diarrhea and vomiting.  Genitourinary: Negative for flank pain.  Musculoskeletal: Negative for back pain and neck pain.  Skin: Negative for rash.  Neurological: Negative for syncope, light-headedness and headaches.  Hematological: Does not bruise/bleed easily.  Psychiatric/Behavioral: Negative for confusion.    Physical Exam Updated Vital Signs BP (!) 144/67 (BP Location: Left Arm)   Pulse 62   Temp 97.9 F (36.6 C) (Oral)   Resp 18   Ht 1.727 m (5\' 8" )   Wt 79.4 kg   SpO2 97%   BMI 26.61 kg/m   Physical Exam Vitals and nursing note reviewed.  Constitutional:      Appearance: Normal appearance. He is well-developed.  HENT:     Head: Atraumatic.     Nose: Nose normal.     Mouth/Throat:     Mouth: Mucous membranes are moist.     Pharynx: Oropharynx is clear.  Eyes:     General: No scleral icterus.    Conjunctiva/sclera: Conjunctivae normal.  Neck:     Trachea: No tracheal deviation.  Cardiovascular:     Rate and Rhythm: Normal rate and regular rhythm.     Pulses:  Normal pulses.     Heart sounds: Normal heart sounds. No murmur heard. No friction rub. No gallop.   Pulmonary:     Effort: Pulmonary effort is normal. No accessory muscle usage or respiratory distress.     Breath sounds: Normal breath sounds.  Abdominal:     General: Bowel sounds are normal. There is no distension.     Palpations: Abdomen is soft.     Tenderness: There is no abdominal tenderness.  There is no guarding.     Comments: Umbilical hernia, soft, not tender, reducible. Non tender, non painful left inguinal hernia.   Genitourinary:    Comments: No cva tenderness. Medium brown colored stool - hemoccult testing.  Musculoskeletal:        General: No swelling.     Cervical back: Normal range of motion and neck supple. No rigidity.  Skin:    General: Skin is warm and dry.     Findings: No rash.  Neurological:     Mental Status: He is alert.     Comments: Alert, speech clear.   Psychiatric:        Mood and Affect: Mood normal.     ED Results / Procedures / Treatments   Labs (all labs ordered are listed, but only abnormal results are displayed) Results for orders placed or performed during the hospital encounter of 10/07/20  CBC  Result Value Ref Range   WBC 12.3 (H) 4.0 - 10.5 K/uL   RBC 2.81 (L) 4.22 - 5.81 MIL/uL   Hemoglobin 8.1 (L) 13.0 - 17.0 g/dL   HCT 27.4 (L) 39.0 - 52.0 %   MCV 97.5 80.0 - 100.0 fL   MCH 28.8 26.0 - 34.0 pg   MCHC 29.6 (L) 30.0 - 36.0 g/dL   RDW 15.7 (H) 11.5 - 15.5 %   Platelets 277 150 - 400 K/uL   nRBC 0.0 0.0 - 0.2 %  Basic metabolic panel  Result Value Ref Range   Sodium 135 135 - 145 mmol/L   Potassium 4.3 3.5 - 5.1 mmol/L   Chloride 110 98 - 111 mmol/L   CO2 23 22 - 32 mmol/L   Glucose, Bld 142 (H) 70 - 99 mg/dL   BUN 32 (H) 8 - 23 mg/dL   Creatinine, Ser 1.68 (H) 0.61 - 1.24 mg/dL   Calcium 8.6 (L) 8.9 - 10.3 mg/dL   GFR, Estimated 39 (L) >60 mL/min   Anion gap <3 (L) 5 - 15  Type and screen  Result Value Ref Range    ABO/RH(D) A NEG    Antibody Screen NEG    Sample Expiration      10/10/2020,2359 Performed at [facility], [address] [place], [place] [address]     EKG None  Radiology No results found.  Procedures Procedures   Medications Ordered in ED Medications - No data to display  ED Course  I have reviewed the triage vital signs and the nursing notes.  Pertinent labs & imaging results that were available during my care of the patient were reviewed by me and considered in my medical decision making (see chart for details).    MDM Rules/Calculators/A&P                         Iv ns. Labs sent.   Reviewed nursing notes and prior charts for additional history.  Recent admission for same reviewed - had ct then, diverticula noted on CT, hemoglobin in 8-8.8 range that visit.   Labs reviewed/interpreted by me - todays hct 27.4 - hct in Care Everywhere during recent admission for same was 26-28. Stool is medium brown, no melena or gross blood.   Recheck abd soft nt.    Pt states feels ready to go home/requests discharge.   Pt currently appears stable for d/c.    Rec GI f/u.  Return precautions. Family indicates trying to get in with Dr Laural Golden - will give info and  have call office tomorrow.       Final Clinical Impression(s) / ED Diagnoses Final diagnoses:  None    Rx / DC Orders ED Discharge Orders    None       Lajean Saver, MD 10/07/20 1814

## 2020-10-07 NOTE — ED Notes (Signed)
Family requesting a update on patients status from EDP. MD made aware.

## 2020-10-07 NOTE — Discharge Instructions (Addendum)
It was our pleasure to provide your ER care today - we hope that you feel better.  Your lab work appears very similar to baseline/prior lab results.   Follow up with gi doctor in the next couple weeks - call office tomorrow to see if able to move forward appointment.   Return to ER if worse, new symptoms, fevers, recurrent or heavy rectal bleeding, new or severe abdominal pain, fainting, persistent vomiting, or other concern.

## 2020-10-08 LAB — POC OCCULT BLOOD, ED: Fecal Occult Bld: NEGATIVE

## 2020-10-09 ENCOUNTER — Other Ambulatory Visit: Payer: Self-pay

## 2020-10-09 ENCOUNTER — Ambulatory Visit: Payer: Medicare Other | Admitting: Gastroenterology

## 2020-10-09 ENCOUNTER — Encounter: Payer: Self-pay | Admitting: Gastroenterology

## 2020-10-09 VITALS — BP 146/69 | HR 70 | Temp 97.1°F | Ht 69.0 in | Wt 175.8 lb

## 2020-10-09 DIAGNOSIS — K921 Melena: Secondary | ICD-10-CM | POA: Insufficient documentation

## 2020-10-09 DIAGNOSIS — K7460 Unspecified cirrhosis of liver: Secondary | ICD-10-CM | POA: Diagnosis not present

## 2020-10-09 DIAGNOSIS — K219 Gastro-esophageal reflux disease without esophagitis: Secondary | ICD-10-CM | POA: Insufficient documentation

## 2020-10-09 DIAGNOSIS — Z79899 Other long term (current) drug therapy: Secondary | ICD-10-CM | POA: Diagnosis not present

## 2020-10-09 DIAGNOSIS — R188 Other ascites: Secondary | ICD-10-CM

## 2020-10-09 DIAGNOSIS — D649 Anemia, unspecified: Secondary | ICD-10-CM | POA: Diagnosis not present

## 2020-10-09 MED ORDER — ONDANSETRON HCL 4 MG PO TABS: 4.0000 mg | ORAL_TABLET | Freq: Three times a day (TID) | ORAL | 0 refills | Status: AC | PRN

## 2020-10-09 NOTE — Patient Instructions (Addendum)
1. We will be calling next week to get you scheduled for an upper endoscopy. 2. Please go get labs between now and Monday. 3. Stop Pepto Bismol ad it will turn stools black. Hopefully after few days, stools should lighten up. If not, this could be sign of ongoing bleeding.  4. Go to the ER for: fever, lightheadedness/worsening weakness, chest pain, shortness of breath, abdominal pain, black stools or bloody stools. `
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
## 2020-10-10 LAB — IRON,TIBC AND FERRITIN PANEL
Ferritin: 282 ng/mL (ref 30–400)
Iron Saturation: 30 % (ref 15–55)
Iron: 39 ug/dL (ref 38–169)
Total Iron Binding Capacity: 132 ug/dL — ABNORMAL LOW (ref 250–450)
UIBC: 93 ug/dL — ABNORMAL LOW (ref 111–343)

## 2020-10-10 LAB — CBC WITH DIFFERENTIAL/PLATELET
Basophils Absolute: 0.1 10*3/uL (ref 0.0–0.2)
Basos: 1 %
EOS (ABSOLUTE): 0.3 10*3/uL (ref 0.0–0.4)
Eos: 3 %
Hematocrit: 25.5 % — ABNORMAL LOW (ref 37.5–51.0)
Hemoglobin: 8.1 g/dL — ABNORMAL LOW (ref 13.0–17.7)
Immature Grans (Abs): 0.1 10*3/uL (ref 0.0–0.1)
Immature Granulocytes: 1 %
Lymphocytes Absolute: 0.8 10*3/uL (ref 0.7–3.1)
Lymphs: 8 %
MCH: 28.7 pg (ref 26.6–33.0)
MCHC: 31.8 g/dL (ref 31.5–35.7)
MCV: 90 fL (ref 79–97)
Monocytes Absolute: 0.6 10*3/uL (ref 0.1–0.9)
Monocytes: 6 %
Neutrophils Absolute: 8.2 10*3/uL — ABNORMAL HIGH (ref 1.4–7.0)
Neutrophils: 81 %
Platelets: 294 10*3/uL (ref 150–450)
RBC: 2.82 x10E6/uL — ABNORMAL LOW (ref 4.14–5.80)
RDW: 14.5 % (ref 11.6–15.4)
WBC: 10.1 10*3/uL (ref 3.4–10.8)

## 2020-10-10 LAB — COMPREHENSIVE METABOLIC PANEL
ALT: 4 IU/L (ref 0–44)
AST: 7 IU/L (ref 0–40)
Albumin/Globulin Ratio: 0.8 — ABNORMAL LOW (ref 1.2–2.2)
Albumin: 2.6 g/dL — ABNORMAL LOW (ref 3.6–4.6)
Alkaline Phosphatase: 107 IU/L (ref 44–121)
BUN/Creatinine Ratio: 18 (ref 10–24)
BUN: 27 mg/dL (ref 8–27)
Bilirubin Total: 0.5 mg/dL (ref 0.0–1.2)
CO2: 20 mmol/L (ref 20–29)
Calcium: 8.9 mg/dL (ref 8.6–10.2)
Chloride: 108 mmol/L — ABNORMAL HIGH (ref 96–106)
Creatinine, Ser: 1.46 mg/dL — ABNORMAL HIGH (ref 0.76–1.27)
GFR calc Af Amer: 50 mL/min/{1.73_m2} — ABNORMAL LOW (ref >59)
GFR calc non Af Amer: 43 mL/min/{1.73_m2} — ABNORMAL LOW (ref >59)
Globulin, Total: 3.4 g/dL (ref 1.5–4.5)
Glucose: 149 mg/dL — ABNORMAL HIGH (ref 65–99)
Potassium: 4.5 mmol/L (ref 3.5–5.2)
Sodium: 141 mmol/L (ref 134–144)
Total Protein: 6 g/dL (ref 6.0–8.5)

## 2020-10-10 LAB — B12 AND FOLATE PANEL
Folate: 4.8 ng/mL (ref >3.0)
Vitamin B-12: 508 pg/mL (ref 232–1245)

## 2020-10-10 LAB — HEPATITIS B SURFACE ANTIGEN: Hepatitis B Surface Ag: NEGATIVE

## 2020-10-10 LAB — PROTIME-INR
INR: 1.2 (ref 0.9–1.2)
Prothrombin Time: 12 s (ref 9.1–12.0)

## 2020-10-10 LAB — HEPATITIS B SURFACE ANTIBODY,QUALITATIVE: Hep B Surface Ab, Qual: NONREACTIVE

## 2020-10-10 LAB — HEPATITIS A ANTIBODY, TOTAL: hep A Total Ab: POSITIVE — AB

## 2020-10-10 LAB — HEPATITIS C ANTIBODY: Hep C Virus Ab: <0.1 s/co ratio (ref 0.0–0.9)

## 2020-10-13 ENCOUNTER — Encounter: Payer: Self-pay | Admitting: *Deleted

## 2020-10-15 ENCOUNTER — Telehealth: Payer: Self-pay | Admitting: *Deleted

## 2020-10-15 NOTE — Telephone Encounter (Signed)
cargive called back. Agreeable to moving procedure up to 2/28 at 11:15am. Aware will need pre-op/covid test prior.  Called endo and patient can go tomorrow at 3pm.  Called caregiver back and made aware of appt details. She voiced understanding/ also discussed in detail prep instructions for EGD.

## 2020-10-15 NOTE — Telephone Encounter (Signed)
Called pt, LMOVM. Patient on cancellation list for sooner appointment.  Dr. Gala Romney had a cancellation for procedure on 2/28

## 2020-10-16 ENCOUNTER — Other Ambulatory Visit: Payer: Self-pay

## 2020-10-16 ENCOUNTER — Encounter (HOSPITAL_COMMUNITY)
Admission: RE | Admit: 2020-10-16 | Discharge: 2020-10-16 | Disposition: A | Discharge status: Home / Self Care | Payer: Medicare Other | Source: Ambulatory Visit | Attending: Internal Medicine | Admitting: Internal Medicine

## 2020-10-16 ENCOUNTER — Encounter (HOSPITAL_COMMUNITY): Payer: Self-pay

## 2020-10-16 ENCOUNTER — Other Ambulatory Visit (HOSPITAL_COMMUNITY)
Admission: RE | Admit: 2020-10-16 | Discharge: 2020-10-16 | Disposition: A | Discharge status: Home / Self Care | Payer: Medicare Other | Source: Ambulatory Visit | Attending: Internal Medicine | Admitting: Internal Medicine

## 2020-10-16 DIAGNOSIS — Z01812 Encounter for preprocedural laboratory examination: Secondary | ICD-10-CM | POA: Insufficient documentation

## 2020-10-16 DIAGNOSIS — Z20822 Contact with and (suspected) exposure to covid-19: Secondary | ICD-10-CM | POA: Insufficient documentation

## 2020-10-16 LAB — CBC WITH DIFFERENTIAL/PLATELET
Abs Immature Granulocytes: 0.05 10*3/uL (ref 0.00–0.07)
Basophils Absolute: 0.1 10*3/uL (ref 0.0–0.1)
Basophils Relative: 1 %
Eosinophils Absolute: 0.3 10*3/uL (ref 0.0–0.5)
Eosinophils Relative: 3 %
HCT: 26.4 % — ABNORMAL LOW (ref 39.0–52.0)
Hemoglobin: 7.9 g/dL — ABNORMAL LOW (ref 13.0–17.0)
Immature Granulocytes: 1 %
Lymphocytes Relative: 8 %
Lymphs Abs: 0.8 10*3/uL (ref 0.7–4.0)
MCH: 29.4 pg (ref 26.0–34.0)
MCHC: 29.9 g/dL — ABNORMAL LOW (ref 30.0–36.0)
MCV: 98.1 fL (ref 80.0–100.0)
Monocytes Absolute: 0.7 10*3/uL (ref 0.1–1.0)
Monocytes Relative: 7 %
Neutro Abs: 8 10*3/uL — ABNORMAL HIGH (ref 1.7–7.7)
Neutrophils Relative %: 80 %
Platelets: 253 10*3/uL (ref 150–400)
RBC: 2.69 MIL/uL — ABNORMAL LOW (ref 4.22–5.81)
RDW: 16.1 % — ABNORMAL HIGH (ref 11.5–15.5)
WBC: 10 10*3/uL (ref 4.0–10.5)
nRBC: 0 % (ref 0.0–0.2)

## 2020-10-16 NOTE — Patient Instructions (Signed)
Zachary Mcmahon  10/16/2020     @PREFPERIOPPHARMACY @   Your procedure is scheduled on 10/19/2020.  Report to [facility] at 9:45 A.M.  Call this number if you have problems the morning of surgery:  [phone]   Remember:  Do not eat or drink after midnight.   Please follow the diet and prep instructions given to you by Dr. Roseanne Kaufman office   :  Take these medicines the morning of surgery with A SIP OF WATER : Amlodipine, Carvedilol, Prilosec, and Tramadol(if needed).     Do not wear jewelry, make-up or nail polish.  Do not wear lotions, powders, or perfumes, or deodorant.  Do not shave 48 hours prior to surgery.  Men may shave face and neck.  Do not bring valuables to the hospital.  [facility] is not responsible for any belongings or valuables.  Contacts, dentures or bridgework may not be worn into surgery.  Leave your suitcase in the car.  After surgery it may be brought to your room.  For patients admitted to the hospital, discharge time will be determined by your treatment team.  Patients discharged the day of surgery will not be allowed to drive home.   Name and phone number of your driver:   Family Special instructions:  n/a  Please read over the following fact sheets that you were given. Care and Recovery After Surgery     Upper Endoscopy, Adult Upper endoscopy is a procedure to look inside the upper GI (gastrointestinal) tract. The upper GI tract is made up of:  The part of the body that moves food from your mouth to your stomach (esophagus).  The stomach.  The first part of your small intestine (duodenum). This procedure is also called esophagogastroduodenoscopy (EGD) or gastroscopy. In this procedure, your health care provider passes a thin, flexible tube (endoscope) through your mouth and down your esophagus into your stomach. A small camera is attached to the end of the tube. Images from the camera appear on a monitor in the exam room. During this procedure,  your health care provider may also remove a small piece of tissue to be sent to a lab and examined under a microscope (biopsy). Your health care provider may do an upper endoscopy to diagnose cancers of the upper GI tract. You may also have this procedure to find the cause of other conditions, such as:  Stomach pain.  Heartburn.  Pain or problems when swallowing.  Nausea and vomiting.  Stomach bleeding.  Stomach ulcers. Tell a health care provider about:  Any allergies you have.  All medicines you are taking, including vitamins, herbs, eye drops, creams, and over-the-counter medicines.  Any problems you or family members have had with anesthetic medicines.  Any blood disorders you have.  Any surgeries you have had.  Any medical conditions you have.  Whether you are pregnant or may be pregnant. What are the risks? Generally, this is a safe procedure. However, problems may occur, including:  Infection.  Bleeding.  Allergic reactions to medicines.  A tear or hole (perforation) in the esophagus, stomach, or duodenum. What happens before the procedure? Staying hydrated Follow instructions from your health care provider about hydration, which may include:  Up to 2 hours before the procedure - you may continue to drink clear liquids, such as water, clear fruit juice, black coffee, and plain tea.   Eating and drinking restrictions Follow instructions from your health care provider about eating and drinking, which may include:  8 hours before the procedure - stop eating heavy meals or foods, such as meat, fried foods, or fatty foods.  6 hours before the procedure - stop eating light meals or foods, such as toast or cereal.  6 hours before the procedure - stop drinking milk or drinks that contain milk.  2 hours before the procedure - stop drinking clear liquids. Medicines Ask your health care provider about:  Changing or stopping your regular medicines. This is  especially important if you are taking diabetes medicines or blood thinners.  Taking medicines such as aspirin and ibuprofen. These medicines can thin your blood. Do not take these medicines unless your health care provider tells you to take them.  Taking over-the-counter medicines, vitamins, herbs, and supplements. General instructions  Plan to have someone take you home from the hospital or clinic.  If you will be going home right after the procedure, plan to have someone with you for 24 hours.  Ask your health care provider what steps will be taken to help prevent infection. What happens during the procedure?  An IV will be inserted into one of your veins.  You may be given one or more of the following: ? A medicine to help you relax (sedative). ? A medicine to numb the throat (local anesthetic).  You will lie on your left side on an exam table.  Your health care provider will pass the endoscope through your mouth and down your esophagus.  Your health care provider will use the scope to check the inside of your esophagus, stomach, and duodenum. Biopsies may be taken.  The endoscope will be removed. The procedure may vary among health care providers and hospitals.   What happens after the procedure?  Your blood pressure, heart rate, breathing rate, and blood oxygen level will be monitored until you leave the hospital or clinic.  Do not drive for 24 hours if you were given a sedative during your procedure.  When your throat is no longer numb, you may be given some fluids to drink.  It is up to you to get the results of your procedure. Ask your health care provider, or the department that is doing the procedure, when your results will be ready. Summary  Upper endoscopy is a procedure to look inside the upper GI tract.  During the procedure, an IV will be inserted into one of your veins. You may be given a medicine to help you relax.  A medicine will be used to numb your  throat.  The endoscope will be passed through your mouth and down your esophagus. This information is not intended to replace advice given to you by your health care provider. Make sure you discuss any questions you have with your health care provider. Document Revised: 01/31/2018 Document Reviewed: 01/08/2018 Elsevier Patient Education  2021 [employer].

## 2020-10-17 LAB — SARS CORONAVIRUS 2 (TAT 6-24 HRS): SARS Coronavirus 2: NEGATIVE

## 2020-10-19 ENCOUNTER — Telehealth: Payer: Self-pay

## 2020-10-19 ENCOUNTER — Ambulatory Visit (HOSPITAL_COMMUNITY)
Admission: RE | Admit: 2020-10-19 | Discharge: 2020-10-19 | Disposition: A | Discharge status: Home / Self Care | Payer: Medicare Other | Attending: Internal Medicine | Admitting: Internal Medicine

## 2020-10-19 ENCOUNTER — Ambulatory Visit (HOSPITAL_COMMUNITY): Payer: Medicare Other | Admitting: Anesthesiology

## 2020-10-19 ENCOUNTER — Encounter (HOSPITAL_COMMUNITY)
Admission: RE | Disposition: A | Discharge status: Home / Self Care | Payer: Self-pay | Source: Home / Self Care | Attending: Internal Medicine

## 2020-10-19 DIAGNOSIS — Z79899 Other long term (current) drug therapy: Secondary | ICD-10-CM | POA: Diagnosis not present

## 2020-10-19 DIAGNOSIS — R188 Other ascites: Secondary | ICD-10-CM | POA: Insufficient documentation

## 2020-10-19 DIAGNOSIS — Z888 Allergy status to other drugs, medicaments and biological substances status: Secondary | ICD-10-CM | POA: Insufficient documentation

## 2020-10-19 DIAGNOSIS — Z881 Allergy status to other antibiotic agents status: Secondary | ICD-10-CM | POA: Insufficient documentation

## 2020-10-19 DIAGNOSIS — D649 Anemia, unspecified: Secondary | ICD-10-CM | POA: Insufficient documentation

## 2020-10-19 DIAGNOSIS — K921 Melena: Secondary | ICD-10-CM

## 2020-10-19 DIAGNOSIS — I5043 Acute on chronic combined systolic (congestive) and diastolic (congestive) heart failure: Secondary | ICD-10-CM | POA: Diagnosis not present

## 2020-10-19 DIAGNOSIS — N184 Chronic kidney disease, stage 4 (severe): Secondary | ICD-10-CM | POA: Diagnosis not present

## 2020-10-19 DIAGNOSIS — I2510 Atherosclerotic heart disease of native coronary artery without angina pectoris: Secondary | ICD-10-CM | POA: Diagnosis not present

## 2020-10-19 DIAGNOSIS — K7460 Unspecified cirrhosis of liver: Secondary | ICD-10-CM | POA: Insufficient documentation

## 2020-10-19 DIAGNOSIS — Z8616 Personal history of COVID-19: Secondary | ICD-10-CM | POA: Diagnosis not present

## 2020-10-19 DIAGNOSIS — K3189 Other diseases of stomach and duodenum: Secondary | ICD-10-CM | POA: Diagnosis not present

## 2020-10-19 DIAGNOSIS — L989 Disorder of the skin and subcutaneous tissue, unspecified: Secondary | ICD-10-CM | POA: Diagnosis not present

## 2020-10-19 DIAGNOSIS — E782 Mixed hyperlipidemia: Secondary | ICD-10-CM | POA: Diagnosis not present

## 2020-10-19 DIAGNOSIS — R600 Localized edema: Secondary | ICD-10-CM | POA: Diagnosis not present

## 2020-10-19 DIAGNOSIS — E1122 Type 2 diabetes mellitus with diabetic chronic kidney disease: Secondary | ICD-10-CM | POA: Diagnosis not present

## 2020-10-19 HISTORY — PX: ESOPHAGOGASTRODUODENOSCOPY (EGD) WITH PROPOFOL: SHX5813

## 2020-10-19 HISTORY — PX: BIOPSY: SHX5522

## 2020-10-19 LAB — GLUCOSE, CAPILLARY
Glucose-Capillary: 96 mg/dL (ref 70–99)
Glucose-Capillary: 106 mg/dL — ABNORMAL HIGH (ref 70–99)

## 2020-10-19 SURGERY — ESOPHAGOGASTRODUODENOSCOPY (EGD) WITH PROPOFOL
Anesthesia: General

## 2020-10-19 MED ORDER — PROPOFOL 10 MG/ML IV BOLUS
INTRAVENOUS | Status: AC
  Filled 2020-10-19: qty 40

## 2020-10-19 MED ORDER — LACTATED RINGERS IV SOLN: INTRAVENOUS | Status: DC

## 2020-10-19 MED ORDER — PROPOFOL 10 MG/ML IV BOLUS
INTRAVENOUS | Status: DC | PRN
  Administered 2020-10-19: 40 mg via INTRAVENOUS
  Administered 2020-10-19 (×2): 20 mg via INTRAVENOUS

## 2020-10-19 MED ORDER — LIDOCAINE VISCOUS HCL 2 % MT SOLN
OROMUCOSAL | Status: AC
  Filled 2020-10-19: qty 15

## 2020-10-19 MED ORDER — LIDOCAINE VISCOUS HCL 2 % MT SOLN
15.0000 mL | Freq: Once | OROMUCOSAL | Status: AC
  Administered 2020-10-19: 15 mL via OROMUCOSAL

## 2020-10-19 MED ORDER — LIDOCAINE HCL (PF) 2 % IJ SOLN
INTRAMUSCULAR | Status: AC
  Filled 2020-10-19: qty 10

## 2020-10-19 NOTE — Transfer of Care (Signed)
Immediate Anesthesia Transfer of Care Note  Patient: Zachary Mcmahon  Procedure(s) Performed: ESOPHAGOGASTRODUODENOSCOPY (EGD) WITH PROPOFOL (N/A ) BIOPSY  Patient Location: PACU  Anesthesia Type:General  Level of Consciousness: awake, alert  and patient cooperative  Airway & Oxygen Therapy: Patient Spontanous Breathing  Post-op Assessment: Report given to RN, Post -op Vital signs reviewed and stable and Patient moving all extremities X 4  Post vital signs: Reviewed and stable  Last Vitals:  Vitals Value Taken Time  BP    Temp    Pulse 66 10/19/20 1208  Resp 20 10/19/20 1208  SpO2 92 % 10/19/20 1208  Vitals shown include unvalidated device data.  Last Pain:  Vitals:   10/19/20 1149  PainSc: 4          Complications: No complications documented.

## 2020-10-19 NOTE — Op Note (Signed)
[facility] Patient Name: Zachary Mcmahon Procedure Date: 10/19/2020 11:30 AM MRN: [id number] Date of Birth: 1932/04/19 Attending MD: Norvel Richards , MD CSN: [id number] Age: 87 Admit Type: Outpatient Procedure:                Upper GI endoscopy Indications:              Melena Providers:                Norvel Richards, MD, Gwenlyn Fudge, RN, Aram Candela Referring MD:              Medicines:                Propofol per Anesthesia Complications:            No immediate complications. Estimated Blood Loss:     Estimated blood loss was minimal. Procedure:                Pre-Anesthesia Assessment:                           - Prior to the procedure, a History and Physical                            was performed, and patient medications and                            allergies were reviewed. The patient's tolerance of                            previous anesthesia was also reviewed. The risks                            and benefits of the procedure and the sedation                            options and risks were discussed with the patient.                            All questions were answered, and informed consent                            was obtained. Prior Anticoagulants: The patient has                            taken no previous anticoagulant or antiplatelet                            agents. ASA Grade Assessment: III - A patient with                            severe systemic disease. After reviewing the risks  and benefits, the patient was deemed in                            satisfactory condition to undergo the procedure.                           After obtaining informed consent, the endoscope was                            passed under direct vision. Throughout the                            procedure, the patient's blood pressure, pulse, and                            oxygen saturations were monitored  continuously. The                            [id number]-H190 ([id number]) scope was introduced through the                            mouth, and advanced to the third part of duodenum.                            The upper GI endoscopy was accomplished without                            difficulty. The patient tolerated the procedure                            well. Scope In: 11:55:05 AM Scope Out: 12:01:00 PM Total Procedure Duration: 0 hours 5 minutes 55 seconds  Findings:      The examined esophagus was normal.      No gross lesions were noted in the entire examined stomach. Subtle,       minimal nodularity of the gastric mucosa diffusely with some increased       friability. No ulcer or infiltrating process. No typical changes       reminiscent of portal gastropathy. Pylorus patent.      The duodenal bulb, second portion of the duodenum and third portion of       the duodenum were normal. Biopsies of the gastric mucosa taken. Impression:               - Normal esophagus.                           - No gross lesions in the stomach. Minimal                            nodularity/friability of uncertain                            significance?"status post biopsy                           - Normal  duodenal bulb, second portion of the                            duodenum and third portion of the duodenum. Nothing                            found today to explain degree of anemia/melena. Moderate Sedation:      Moderate (conscious) sedation was personally administered by an       anesthesia professional. The following parameters were monitored: oxygen       saturation, heart rate, blood pressure, respiratory rate, EKG, adequacy       of pulmonary ventilation, and response to care. Recommendation:           - Patient has a contact number available for                            emergencies. The signs and symptoms of potential                            delayed complications were discussed with the                             patient. Return to normal activities tomorrow.                            Written discharge instructions were provided to the                            patient.                           - Advance diet as tolerated. We should go ahead and                            proceed with a capsule study of the small                            intestine. Repeat H&H on March 3. Further                            recommendations to follow. Procedure Code(s):        --- Professional ---                           [phone], Esophagogastroduodenoscopy, flexible,                            transoral; diagnostic, including collection of                            specimen(s) by brushing or washing, when performed                            (separate procedure) Diagnosis Code(s):        --- Professional ---  K92.1, Melena (includes Hematochezia) CPT copyright 2019 American Medical Association. All rights reserved. The codes documented in this report are preliminary and upon coder review may  be revised to meet current compliance requirements. [name]. [name], MD Norvel Richards, MD 10/19/2020 12:17:08 PM This report has been signed electronically. Number of Addenda: 0

## 2020-10-19 NOTE — Anesthesia Preprocedure Evaluation (Addendum)
Anesthesia Evaluation  Patient identified by MRN, date of birth, ID band Patient awake    Reviewed: Allergy & Precautions, NPO status , Patient's Chart, lab work & pertinent test results, reviewed documented beta blocker date and time   Airway Mallampati: III  TM Distance: >3 FB Neck ROM: Full    Dental  (+) Dental Advisory Given, Loose, Missing, Chipped,    Pulmonary shortness of breath and with exertion, pneumonia, resolved, PE   Pulmonary exam normal breath sounds clear to auscultation       Cardiovascular Exercise Tolerance: Poor hypertension, Pt. on home beta blockers and Pt. on medications + angina + CAD, + Past MI, + CABG, +CHF and + DOE  Normal cardiovascular exam Rhythm:Regular Rate:Normal  2017 -  Left ventricle: The cavity size was mildly dilated. Wall  thickness was normal. Systolic function was mildly to moderately reduced. The estimated ejection fraction was in the range of 40% to 45%. Hypokinesis of the inferior myocardium. Features are consistent with a pseudonormal left ventricular filling pattern,  with concomitant abnormal relaxation and increased filling pressure (grade 2 diastolic dysfunction).  - Mitral valve: There was mild to moderate regurgitation directed centrally.  - Left atrium: The atrium was moderately dilated.  - Right ventricle: The cavity size was mildly dilated. Systolic function was mildly reduced.  - Right atrium: The atrium was mildly dilated.   07-Oct-2020 15:42:14 [facility] ROUTINE RECORD Sinus rhythm Rightward axis Nonspecific T wave abnormality Baseline wander Confirmed by Lajean Saver [phone]) on 10/07/2020 4:39:12 PM   Neuro/Psych  Neuromuscular disease    GI/Hepatic Neg liver ROS, GERD  Medicated and Controlled,  Endo/Other  diabetes, Well Controlled, Type 2  Renal/GU Renal InsufficiencyRenal disease     Musculoskeletal  (+) Arthritis  (gout),    Abdominal   Peds  Hematology  (+) anemia ,   Anesthesia Other Findings   Reproductive/Obstetrics                            Anesthesia Physical Anesthesia Plan  ASA: IV  Anesthesia Plan: General   Post-op Pain Management:    Induction: Intravenous  PONV Risk Score and Plan:   Airway Management Planned: Nasal Cannula and Natural Airway  Additional Equipment:   Intra-op Plan:   Post-operative Plan:   Informed Consent: I have reviewed the patients History and Physical, chart, labs and discussed the procedure including the risks, benefits and alternatives for the proposed anesthesia with the patient or authorized representative who has indicated his/her understanding and acceptance.     Dental advisory given  Plan Discussed with: CRNA and Surgeon  Anesthesia Plan Comments:        Anesthesia Quick Evaluation

## 2020-10-19 NOTE — Telephone Encounter (Signed)
Zachary Mcmahon at [facility] called office and LMOVM. Dr. Gala Romney wants pt to be scheduled for [facility] next week. Per discharge summary: I do recommend a capsule endoscopy for small intestine. That will be scheduled for next week.  Tried to call Zachary Mcmahon (caregiver), LMOVM for return call.

## 2020-10-19 NOTE — Interval H&P Note (Signed)
History and Physical Interval Note:  10/19/2020 11:45 AM  Vanessa Ralphs  has presented today for surgery, with the diagnosis of UGI Bleed, anemia.  The various methods of treatment have been discussed with the patient and family. After consideration of risks, benefits and other options for treatment, the patient has consented to  Procedure(s) with comments: ESOPHAGOGASTRODUODENOSCOPY (EGD) WITH PROPOFOL (N/A) - pm appt as a surgical intervention.  The patient's history has been reviewed, patient examined, no change in status, stable for surgery.  I have reviewed the patient's chart and labs.  Questions were answered to the patient's satisfaction.     Manus Rudd  Caregiver reports additional black tarry bowel movements since seen in the office.  Hemoglobin 7.93 days ago.  No dysphagia.  Discussed pros and cons risk benefits limitations alternatives of EGD today.  I told patient and daughter/caregiver if EGD is unrevealing a capsule study the small intestine likely be the next step.  Questions were answered.  Parties agreeable.

## 2020-10-19 NOTE — Discharge Instructions (Signed)
EGD Discharge instructions Please read the instructions outlined below and refer to this sheet in the next few weeks. These discharge instructions provide you with general information on caring for yourself after you leave the hospital. Your doctor may also give you specific instructions. While your treatment has been planned according to the most current medical practices available, unavoidable complications occasionally occur. If you have any problems or questions after discharge, please call your doctor. ACTIVITY  You may resume your regular activity but move at a slower pace for the next 24 hours.   Take frequent rest periods for the next 24 hours.   Walking will help expel (get rid of) the air and reduce the bloated feeling in your abdomen.   No driving for 24 hours (because of the anesthesia (medicine) used during the test).   You may shower.   Do not sign any important legal documents or operate any machinery for 24 hours (because of the anesthesia used during the test).  NUTRITION  Drink plenty of fluids.   You may resume your normal diet.   Begin with a light meal and progress to your normal diet.   Avoid alcoholic beverages for 24 hours or as instructed by your caregiver.  MEDICATIONS  You may resume your normal medications unless your caregiver tells you otherwise.  WHAT YOU CAN EXPECT TODAY  You may experience abdominal discomfort such as a feeling of fullness or gas pains.  FOLLOW-UP  Your doctor will discuss the results of your test with you.  SEEK IMMEDIATE MEDICAL ATTENTION IF ANY OF THE FOLLOWING OCCUR:  Excessive nausea (feeling sick to your stomach) and/or vomiting.   Severe abdominal pain and distention (swelling).   Trouble swallowing.   Temperature over 101 F (37.8 C).   Rectal bleeding or vomiting of blood.   Stomach appeared to be just minimally irritated.  Small biopsies were taken.  No sign of liver disease found on today's examination  which is good news  I do recommend a capsule endoscopy for small intestine.  That will be scheduled for next week      ******Left message for Tretha Sciara at Dr. Roseanne Kaufman office to schedule and call patient***********************  Further recommendations to follow pending review of pathology report.        Monitored Anesthesia Care, Care After This sheet gives you information about how to care for yourself after your procedure. Your health care provider may also give you more specific instructions. If you have problems or questions, contact your health care provider. What can I expect after the procedure? After the procedure, it is common to have:  Tiredness.  Forgetfulness about what happened after the procedure.  Impaired judgment for important decisions.  Nausea or vomiting.  Some difficulty with balance. Follow these instructions at home: For the time period you were told by your health care provider:  Rest as needed.  Do not participate in activities where you could fall or become injured.  Do not drive or use machinery.  Do not drink alcohol.  Do not take sleeping pills or medicines that cause drowsiness.  Do not make important decisions or sign legal documents.  Do not take care of children on your own.      Eating and drinking  Follow the diet that is recommended by your health care provider.  Drink enough fluid to keep your urine pale yellow.  If you vomit: ? Drink water, juice, or soup when you can drink without vomiting. ? Make sure you  have little or no nausea before eating solid foods. General instructions  Have a responsible adult stay with you for the time you are told. It is important to have someone help care for you until you are awake and alert.  Take over-the-counter and prescription medicines only as told by your health care provider.  If you have sleep apnea, surgery and certain medicines can increase your risk for breathing problems. Follow  instructions from your health care provider about wearing your sleep device: ? Anytime you are sleeping, including during daytime naps. ? While taking prescription pain medicines, sleeping medicines, or medicines that make you drowsy.  Avoid smoking.  Keep all follow-up visits as told by your health care provider. This is important. Contact a health care provider if:  You keep feeling nauseous or you keep vomiting.  You feel light-headed.  You are still sleepy or having trouble with balance after 24 hours.  You develop a rash.  You have a fever.  You have redness or swelling around the IV site. Get help right away if:  You have trouble breathing.  You have new-onset confusion at home. Summary  For several hours after your procedure, you may feel tired. You may also be forgetful and have poor judgment.  Have a responsible adult stay with you for the time you are told. It is important to have someone help care for you until you are awake and alert.  Rest as told. Do not drive or operate machinery. Do not drink alcohol or take sleeping pills.  Get help right away if you have trouble breathing, or if you suddenly become confused. This information is not intended to replace advice given to you by your health care provider. Make sure you discuss any questions you have with your health care provider. Document Revised: 04/23/2020 Document Reviewed: 07/11/2019 Elsevier Patient Education  2021 [employer].  Repeat H&H this week on March 3  At patient request, I called [name] [phone] -reviewed findings and recommendations

## 2020-10-19 NOTE — Anesthesia Postprocedure Evaluation (Signed)
Anesthesia Post Note  Patient: Zachary Mcmahon  Procedure(s) Performed: ESOPHAGOGASTRODUODENOSCOPY (EGD) WITH PROPOFOL (N/A ) BIOPSY  Patient location during evaluation: PACU Anesthesia Type: General Level of consciousness: awake and patient cooperative Pain management: satisfactory to patient Vital Signs Assessment: post-procedure vital signs reviewed and stable Respiratory status: spontaneous breathing, respiratory function stable and nonlabored ventilation Cardiovascular status: stable Postop Assessment: no apparent nausea or vomiting Anesthetic complications: no   No complications documented.   Last Vitals:  Vitals:   10/19/20 1045  BP: 140/68  Pulse: 70  Resp: 18  Temp: 36.8 C  SpO2: 100%    Last Pain:  Vitals:   10/19/20 1149  PainSc: 4                  Nicklos Gaxiola

## 2020-10-20 NOTE — Telephone Encounter (Signed)
Tried to call [name], no answer, LMOVM for return call.

## 2020-10-20 NOTE — Telephone Encounter (Signed)
Spoke to Gracy Bruins scheduled for 10/29/20 at 7:30am. He will arrive at 7:00am. Instructions given to [name] and she wrote them down and repeated back. Instructions also mailed. Orders entered.   PA not required for [facility] per [facility] website. Decision ID# [id number].  Message sent to Neil Crouch PA to read.

## 2020-10-21 LAB — SURGICAL PATHOLOGY

## 2020-10-22 ENCOUNTER — Encounter (HOSPITAL_COMMUNITY): Payer: Self-pay | Admitting: Internal Medicine

## 2020-10-25 ENCOUNTER — Encounter: Payer: Self-pay | Admitting: Internal Medicine

## 2020-10-28 ENCOUNTER — Telehealth: Payer: Self-pay | Admitting: Internal Medicine

## 2020-10-28 NOTE — Telephone Encounter (Signed)
Endo scheduler informed to cancel [facility] per pt's request.  FYI to Walden Field NP since he was going to read [facility].

## 2020-10-28 NOTE — Telephone Encounter (Signed)
Noted, thanks for the heads up!

## 2020-10-28 NOTE — Telephone Encounter (Signed)
Pt's caregiver, [name], called and she needs to speak to the nurse about his medication. Please call [phone]

## 2020-10-28 NOTE — Telephone Encounter (Signed)
Pt wants to cancel his capsule study for tomorrow. Pt states since he d/c iron, his stool went back to normal and he feels better.

## 2020-10-29 ENCOUNTER — Telehealth: Payer: Self-pay

## 2020-10-29 ENCOUNTER — Encounter (HOSPITAL_COMMUNITY): Admission: RE | Payer: Self-pay | Source: Home / Self Care

## 2020-10-29 ENCOUNTER — Other Ambulatory Visit: Payer: Self-pay

## 2020-10-29 ENCOUNTER — Ambulatory Visit (HOSPITAL_COMMUNITY): Admission: RE | Admit: 2020-10-29 | Payer: Medicare Other | Source: Home / Self Care | Admitting: Internal Medicine

## 2020-10-29 DIAGNOSIS — D649 Anemia, unspecified: Secondary | ICD-10-CM

## 2020-10-29 SURGERY — IMAGING PROCEDURE, GI TRACT, INTRALUMINAL, VIA CAPSULE
Anesthesia: Monitor Anesthesia Care

## 2020-10-29 NOTE — Telephone Encounter (Signed)
-----   Message from Mahala Menghini, PA-C sent at 10/29/2020  5:91 AM EST ----- Zachary Mcmahon,   On Dr. Roseanne Kaufman behalf, can you please arrange for patient to have follow up H/H as this did not get done by endo.   FYI Dr. Gala Romney, see note that patient cancelled the capsule study you ordered.   Magda Paganini ----- Message ----- From: Daneil Dolin, MD Sent: 10/20/2020   1:51 PM EST To: Mahala Menghini, PA-C  Endo nurses - for this coming Thursday.  Would not bet my life on that being done though.  Good question. ----- Message ----- From: Westly Pam Sent: 10/20/2020   1:28 PM EST To: Daneil Dolin, MD  Did you let someone know to order the lab for this week? ----- Message ----- From: Daneil Dolin, MD Sent: 10/19/2020  12:10 PM EST To: Mahala Menghini, PA-C  Not much on EGD.  Moving towards a capsule study the small bowel for next week repeat H&H later this week (unless you have other ideas).  Daughter/caregiver endorses definite black tarry stools intermittently since you saw him in the office.

## 2020-10-29 NOTE — Telephone Encounter (Signed)
FYI, Spoke with pts care giver and pt will complete H/H at [facility]. Orders placed and released.

## 2020-10-29 NOTE — Telephone Encounter (Signed)
Thanks

## 2020-10-30 DIAGNOSIS — D649 Anemia, unspecified: Secondary | ICD-10-CM | POA: Diagnosis not present

## 2020-10-30 NOTE — Telephone Encounter (Signed)
Not recommended but pts wishes noted

## 2020-10-31 LAB — HEMOGLOBIN AND HEMATOCRIT, BLOOD
Hematocrit: 23.6 % — ABNORMAL LOW (ref 37.5–51.0)
Hemoglobin: 7.5 g/dL — ABNORMAL LOW (ref 13.0–17.7)

## 2020-11-02 ENCOUNTER — Other Ambulatory Visit: Payer: Self-pay

## 2020-11-02 DIAGNOSIS — D649 Anemia, unspecified: Secondary | ICD-10-CM

## 2020-11-10 ENCOUNTER — Other Ambulatory Visit (HOSPITAL_COMMUNITY): Payer: Medicare Other

## 2020-11-10 ENCOUNTER — Ambulatory Visit: Payer: Medicare Other | Admitting: Gastroenterology

## 2020-11-16 DIAGNOSIS — D649 Anemia, unspecified: Secondary | ICD-10-CM | POA: Diagnosis not present

## 2020-11-17 LAB — HEMOGLOBIN AND HEMATOCRIT, BLOOD
Hematocrit: 25.8 % — ABNORMAL LOW (ref 37.5–51.0)
Hemoglobin: 8.2 g/dL — ABNORMAL LOW (ref 13.0–17.7)

## 2020-11-23 ENCOUNTER — Other Ambulatory Visit: Payer: Self-pay

## 2020-11-23 DIAGNOSIS — D649 Anemia, unspecified: Secondary | ICD-10-CM

## 2020-11-24 DIAGNOSIS — R0902 Hypoxemia: Secondary | ICD-10-CM | POA: Diagnosis not present

## 2020-11-24 DIAGNOSIS — K922 Gastrointestinal hemorrhage, unspecified: Secondary | ICD-10-CM | POA: Diagnosis not present

## 2020-11-24 DIAGNOSIS — Z743 Need for continuous supervision: Secondary | ICD-10-CM | POA: Diagnosis not present

## 2020-11-24 DIAGNOSIS — R58 Hemorrhage, not elsewhere classified: Secondary | ICD-10-CM | POA: Diagnosis not present

## 2020-11-24 DIAGNOSIS — I499 Cardiac arrhythmia, unspecified: Secondary | ICD-10-CM | POA: Diagnosis not present

## 2020-11-24 DIAGNOSIS — Z20822 Contact with and (suspected) exposure to covid-19: Secondary | ICD-10-CM | POA: Diagnosis not present

## 2020-11-24 DIAGNOSIS — Z888 Allergy status to other drugs, medicaments and biological substances status: Secondary | ICD-10-CM | POA: Diagnosis not present

## 2020-11-24 DIAGNOSIS — I110 Hypertensive heart disease with heart failure: Secondary | ICD-10-CM | POA: Diagnosis not present

## 2020-11-24 DIAGNOSIS — E119 Type 2 diabetes mellitus without complications: Secondary | ICD-10-CM | POA: Diagnosis not present

## 2020-11-24 DIAGNOSIS — R6889 Other general symptoms and signs: Secondary | ICD-10-CM | POA: Diagnosis not present

## 2020-11-24 DIAGNOSIS — R9431 Abnormal electrocardiogram [ECG] [EKG]: Secondary | ICD-10-CM | POA: Diagnosis not present

## 2020-11-24 DIAGNOSIS — I509 Heart failure, unspecified: Secondary | ICD-10-CM | POA: Diagnosis not present

## 2020-11-25 DIAGNOSIS — I252 Old myocardial infarction: Secondary | ICD-10-CM | POA: Diagnosis not present

## 2020-11-25 DIAGNOSIS — I509 Heart failure, unspecified: Secondary | ICD-10-CM | POA: Diagnosis not present

## 2020-11-25 DIAGNOSIS — R933 Abnormal findings on diagnostic imaging of other parts of digestive tract: Secondary | ICD-10-CM | POA: Diagnosis not present

## 2020-11-25 DIAGNOSIS — K7469 Other cirrhosis of liver: Secondary | ICD-10-CM | POA: Diagnosis not present

## 2020-11-25 DIAGNOSIS — R590 Localized enlarged lymph nodes: Secondary | ICD-10-CM | POA: Diagnosis not present

## 2020-11-25 DIAGNOSIS — R578 Other shock: Secondary | ICD-10-CM | POA: Diagnosis not present

## 2020-11-25 DIAGNOSIS — Z888 Allergy status to other drugs, medicaments and biological substances status: Secondary | ICD-10-CM | POA: Diagnosis not present

## 2020-11-25 DIAGNOSIS — K921 Melena: Secondary | ICD-10-CM | POA: Diagnosis not present

## 2020-11-25 DIAGNOSIS — Z515 Encounter for palliative care: Secondary | ICD-10-CM | POA: Diagnosis not present

## 2020-11-25 DIAGNOSIS — I5043 Acute on chronic combined systolic (congestive) and diastolic (congestive) heart failure: Secondary | ICD-10-CM | POA: Diagnosis not present

## 2020-11-25 DIAGNOSIS — D500 Iron deficiency anemia secondary to blood loss (chronic): Secondary | ICD-10-CM | POA: Diagnosis not present

## 2020-11-25 DIAGNOSIS — N183 Chronic kidney disease, stage 3 unspecified: Secondary | ICD-10-CM | POA: Diagnosis not present

## 2020-11-25 DIAGNOSIS — N189 Chronic kidney disease, unspecified: Secondary | ICD-10-CM | POA: Diagnosis not present

## 2020-11-25 DIAGNOSIS — I129 Hypertensive chronic kidney disease with stage 1 through stage 4 chronic kidney disease, or unspecified chronic kidney disease: Secondary | ICD-10-CM | POA: Diagnosis not present

## 2020-11-25 DIAGNOSIS — Z86711 Personal history of pulmonary embolism: Secondary | ICD-10-CM | POA: Diagnosis not present

## 2020-11-25 DIAGNOSIS — I130 Hypertensive heart and chronic kidney disease with heart failure and stage 1 through stage 4 chronic kidney disease, or unspecified chronic kidney disease: Secondary | ICD-10-CM | POA: Diagnosis not present

## 2020-11-25 DIAGNOSIS — E1122 Type 2 diabetes mellitus with diabetic chronic kidney disease: Secondary | ICD-10-CM | POA: Diagnosis not present

## 2020-11-25 DIAGNOSIS — D631 Anemia in chronic kidney disease: Secondary | ICD-10-CM | POA: Diagnosis not present

## 2020-11-25 DIAGNOSIS — J449 Chronic obstructive pulmonary disease, unspecified: Secondary | ICD-10-CM | POA: Diagnosis not present

## 2020-11-25 DIAGNOSIS — C189 Malignant neoplasm of colon, unspecified: Secondary | ICD-10-CM | POA: Diagnosis not present

## 2020-11-25 DIAGNOSIS — K922 Gastrointestinal hemorrhage, unspecified: Secondary | ICD-10-CM | POA: Diagnosis not present

## 2020-11-25 DIAGNOSIS — I2510 Atherosclerotic heart disease of native coronary artery without angina pectoris: Secondary | ICD-10-CM | POA: Diagnosis not present

## 2020-11-25 DIAGNOSIS — Z794 Long term (current) use of insulin: Secondary | ICD-10-CM | POA: Diagnosis not present

## 2020-11-25 DIAGNOSIS — K6389 Other specified diseases of intestine: Secondary | ICD-10-CM | POA: Diagnosis not present

## 2020-11-25 DIAGNOSIS — Z7189 Other specified counseling: Secondary | ICD-10-CM | POA: Diagnosis not present

## 2020-11-25 DIAGNOSIS — K4090 Unilateral inguinal hernia, without obstruction or gangrene, not specified as recurrent: Secondary | ICD-10-CM | POA: Diagnosis not present

## 2020-11-25 DIAGNOSIS — K639 Disease of intestine, unspecified: Secondary | ICD-10-CM | POA: Diagnosis not present

## 2020-11-25 DIAGNOSIS — K7460 Unspecified cirrhosis of liver: Secondary | ICD-10-CM | POA: Diagnosis not present

## 2020-11-25 DIAGNOSIS — E440 Moderate protein-calorie malnutrition: Secondary | ICD-10-CM | POA: Diagnosis not present

## 2020-11-25 DIAGNOSIS — N1831 Chronic kidney disease, stage 3a: Secondary | ICD-10-CM | POA: Diagnosis not present

## 2020-11-25 DIAGNOSIS — G309 Alzheimer's disease, unspecified: Secondary | ICD-10-CM | POA: Diagnosis not present

## 2020-11-25 DIAGNOSIS — K625 Hemorrhage of anus and rectum: Secondary | ICD-10-CM | POA: Diagnosis not present

## 2020-11-25 DIAGNOSIS — I5042 Chronic combined systolic (congestive) and diastolic (congestive) heart failure: Secondary | ICD-10-CM | POA: Diagnosis not present

## 2020-11-25 DIAGNOSIS — E1142 Type 2 diabetes mellitus with diabetic polyneuropathy: Secondary | ICD-10-CM | POA: Diagnosis not present

## 2020-11-25 DIAGNOSIS — R9431 Abnormal electrocardiogram [ECG] [EKG]: Secondary | ICD-10-CM | POA: Diagnosis not present

## 2020-11-25 DIAGNOSIS — Z66 Do not resuscitate: Secondary | ICD-10-CM | POA: Diagnosis not present

## 2020-11-25 DIAGNOSIS — E785 Hyperlipidemia, unspecified: Secondary | ICD-10-CM | POA: Diagnosis not present

## 2020-11-25 DIAGNOSIS — I5040 Unspecified combined systolic (congestive) and diastolic (congestive) heart failure: Secondary | ICD-10-CM | POA: Diagnosis not present

## 2020-11-25 DIAGNOSIS — R188 Other ascites: Secondary | ICD-10-CM | POA: Diagnosis not present

## 2020-11-25 DIAGNOSIS — I110 Hypertensive heart disease with heart failure: Secondary | ICD-10-CM | POA: Diagnosis not present

## 2020-11-25 DIAGNOSIS — I959 Hypotension, unspecified: Secondary | ICD-10-CM | POA: Diagnosis not present

## 2020-11-25 DIAGNOSIS — G9009 Other idiopathic peripheral autonomic neuropathy: Secondary | ICD-10-CM | POA: Diagnosis not present

## 2020-11-25 DIAGNOSIS — E119 Type 2 diabetes mellitus without complications: Secondary | ICD-10-CM | POA: Diagnosis not present

## 2020-11-25 DIAGNOSIS — Z951 Presence of aortocoronary bypass graft: Secondary | ICD-10-CM | POA: Diagnosis not present

## 2020-11-25 DIAGNOSIS — Z20822 Contact with and (suspected) exposure to covid-19: Secondary | ICD-10-CM | POA: Diagnosis not present

## 2020-11-25 DIAGNOSIS — K219 Gastro-esophageal reflux disease without esophagitis: Secondary | ICD-10-CM | POA: Diagnosis not present

## 2020-11-26 DIAGNOSIS — K921 Melena: Secondary | ICD-10-CM | POA: Diagnosis not present

## 2020-11-26 DIAGNOSIS — E440 Moderate protein-calorie malnutrition: Secondary | ICD-10-CM | POA: Diagnosis not present

## 2020-11-26 DIAGNOSIS — K7460 Unspecified cirrhosis of liver: Secondary | ICD-10-CM | POA: Diagnosis not present

## 2020-11-26 DIAGNOSIS — K922 Gastrointestinal hemorrhage, unspecified: Secondary | ICD-10-CM | POA: Diagnosis not present

## 2020-11-26 DIAGNOSIS — I5042 Chronic combined systolic (congestive) and diastolic (congestive) heart failure: Secondary | ICD-10-CM | POA: Diagnosis not present

## 2020-11-26 DIAGNOSIS — R590 Localized enlarged lymph nodes: Secondary | ICD-10-CM | POA: Diagnosis not present

## 2020-11-26 DIAGNOSIS — R188 Other ascites: Secondary | ICD-10-CM | POA: Diagnosis not present

## 2020-11-26 DIAGNOSIS — K4090 Unilateral inguinal hernia, without obstruction or gangrene, not specified as recurrent: Secondary | ICD-10-CM | POA: Diagnosis not present

## 2020-11-26 DIAGNOSIS — K6389 Other specified diseases of intestine: Secondary | ICD-10-CM | POA: Diagnosis not present

## 2020-11-26 DIAGNOSIS — K639 Disease of intestine, unspecified: Secondary | ICD-10-CM | POA: Diagnosis not present

## 2020-11-27 DIAGNOSIS — Z7189 Other specified counseling: Secondary | ICD-10-CM | POA: Diagnosis not present

## 2020-11-27 DIAGNOSIS — I129 Hypertensive chronic kidney disease with stage 1 through stage 4 chronic kidney disease, or unspecified chronic kidney disease: Secondary | ICD-10-CM | POA: Diagnosis not present

## 2020-11-27 DIAGNOSIS — N189 Chronic kidney disease, unspecified: Secondary | ICD-10-CM | POA: Diagnosis not present

## 2020-11-27 DIAGNOSIS — R933 Abnormal findings on diagnostic imaging of other parts of digestive tract: Secondary | ICD-10-CM | POA: Diagnosis not present

## 2020-11-27 DIAGNOSIS — I509 Heart failure, unspecified: Secondary | ICD-10-CM | POA: Diagnosis not present

## 2020-11-27 DIAGNOSIS — C189 Malignant neoplasm of colon, unspecified: Secondary | ICD-10-CM | POA: Diagnosis not present

## 2020-11-27 DIAGNOSIS — E785 Hyperlipidemia, unspecified: Secondary | ICD-10-CM | POA: Diagnosis not present

## 2020-11-27 DIAGNOSIS — K625 Hemorrhage of anus and rectum: Secondary | ICD-10-CM | POA: Diagnosis not present

## 2020-11-27 DIAGNOSIS — E1142 Type 2 diabetes mellitus with diabetic polyneuropathy: Secondary | ICD-10-CM | POA: Diagnosis not present

## 2020-11-27 DIAGNOSIS — Z66 Do not resuscitate: Secondary | ICD-10-CM | POA: Diagnosis not present

## 2020-11-27 DIAGNOSIS — N1831 Chronic kidney disease, stage 3a: Secondary | ICD-10-CM | POA: Diagnosis not present

## 2020-11-27 DIAGNOSIS — K922 Gastrointestinal hemorrhage, unspecified: Secondary | ICD-10-CM | POA: Diagnosis not present

## 2020-11-27 DIAGNOSIS — K4090 Unilateral inguinal hernia, without obstruction or gangrene, not specified as recurrent: Secondary | ICD-10-CM | POA: Diagnosis not present

## 2020-11-27 DIAGNOSIS — R188 Other ascites: Secondary | ICD-10-CM | POA: Diagnosis not present

## 2020-11-27 DIAGNOSIS — I2510 Atherosclerotic heart disease of native coronary artery without angina pectoris: Secondary | ICD-10-CM | POA: Diagnosis not present

## 2020-11-27 DIAGNOSIS — R590 Localized enlarged lymph nodes: Secondary | ICD-10-CM | POA: Diagnosis not present

## 2020-11-27 DIAGNOSIS — Z515 Encounter for palliative care: Secondary | ICD-10-CM | POA: Diagnosis not present

## 2020-11-27 DIAGNOSIS — K6389 Other specified diseases of intestine: Secondary | ICD-10-CM | POA: Diagnosis not present

## 2020-11-28 DIAGNOSIS — K922 Gastrointestinal hemorrhage, unspecified: Secondary | ICD-10-CM | POA: Diagnosis not present

## 2020-11-28 DIAGNOSIS — I2510 Atherosclerotic heart disease of native coronary artery without angina pectoris: Secondary | ICD-10-CM | POA: Diagnosis not present

## 2020-11-28 DIAGNOSIS — N1831 Chronic kidney disease, stage 3a: Secondary | ICD-10-CM | POA: Diagnosis not present

## 2020-11-28 DIAGNOSIS — E1142 Type 2 diabetes mellitus with diabetic polyneuropathy: Secondary | ICD-10-CM | POA: Diagnosis not present

## 2020-11-28 DIAGNOSIS — I509 Heart failure, unspecified: Secondary | ICD-10-CM | POA: Diagnosis not present

## 2020-11-28 DIAGNOSIS — E785 Hyperlipidemia, unspecified: Secondary | ICD-10-CM | POA: Diagnosis not present

## 2020-11-28 DIAGNOSIS — I129 Hypertensive chronic kidney disease with stage 1 through stage 4 chronic kidney disease, or unspecified chronic kidney disease: Secondary | ICD-10-CM | POA: Diagnosis not present

## 2020-11-28 DIAGNOSIS — K6389 Other specified diseases of intestine: Secondary | ICD-10-CM | POA: Diagnosis not present

## 2020-11-28 DIAGNOSIS — Z515 Encounter for palliative care: Secondary | ICD-10-CM | POA: Diagnosis not present

## 2020-11-29 DIAGNOSIS — I5043 Acute on chronic combined systolic (congestive) and diastolic (congestive) heart failure: Secondary | ICD-10-CM | POA: Diagnosis not present

## 2020-11-29 DIAGNOSIS — E1142 Type 2 diabetes mellitus with diabetic polyneuropathy: Secondary | ICD-10-CM | POA: Diagnosis not present

## 2020-11-29 DIAGNOSIS — K7469 Other cirrhosis of liver: Secondary | ICD-10-CM | POA: Diagnosis not present

## 2020-11-29 DIAGNOSIS — K6389 Other specified diseases of intestine: Secondary | ICD-10-CM | POA: Diagnosis not present

## 2020-11-29 DIAGNOSIS — D631 Anemia in chronic kidney disease: Secondary | ICD-10-CM | POA: Diagnosis not present

## 2020-11-29 DIAGNOSIS — K922 Gastrointestinal hemorrhage, unspecified: Secondary | ICD-10-CM | POA: Diagnosis not present

## 2020-11-29 DIAGNOSIS — N1831 Chronic kidney disease, stage 3a: Secondary | ICD-10-CM | POA: Diagnosis not present

## 2020-11-29 DIAGNOSIS — I959 Hypotension, unspecified: Secondary | ICD-10-CM | POA: Diagnosis not present

## 2020-12-07 ENCOUNTER — Telehealth: Payer: Self-pay | Admitting: Dermatology

## 2020-12-07 NOTE — Telephone Encounter (Signed)
[name], Trilby Drummer Severe's caretaker, left message on office voice mail that Mr. Krinke passed away on 11-30-22.

## 2020-12-20 DEATH — deceased

## 2021-01-27 ENCOUNTER — Ambulatory Visit: Payer: Medicare Other | Admitting: Cardiology
